# Patient Record
Sex: Female | Born: 1975
Health system: Southern US, Community
[De-identification: ages and names within clinical notes are randomized; demographics above are authoritative.]

## PROBLEM LIST (undated history)

## (undated) DIAGNOSIS — R519 Headache, unspecified: Secondary | ICD-10-CM

## (undated) DIAGNOSIS — T4145XA Adverse effect of unspecified anesthetic, initial encounter: Secondary | ICD-10-CM

## (undated) DIAGNOSIS — M436 Torticollis: Secondary | ICD-10-CM

## (undated) DIAGNOSIS — I498 Other specified cardiac arrhythmias: Secondary | ICD-10-CM

## (undated) DIAGNOSIS — D649 Anemia, unspecified: Secondary | ICD-10-CM

## (undated) DIAGNOSIS — J069 Acute upper respiratory infection, unspecified: Secondary | ICD-10-CM

## (undated) DIAGNOSIS — K219 Gastro-esophageal reflux disease without esophagitis: Secondary | ICD-10-CM

## (undated) DIAGNOSIS — I8290 Acute embolism and thrombosis of unspecified vein: Secondary | ICD-10-CM

## (undated) DIAGNOSIS — C4491 Basal cell carcinoma of skin, unspecified: Secondary | ICD-10-CM

## (undated) DIAGNOSIS — T783XXA Angioneurotic edema, initial encounter: Secondary | ICD-10-CM

## (undated) DIAGNOSIS — Z87442 Personal history of urinary calculi: Secondary | ICD-10-CM

## (undated) DIAGNOSIS — R42 Dizziness and giddiness: Secondary | ICD-10-CM

## (undated) DIAGNOSIS — Z8489 Family history of other specified conditions: Secondary | ICD-10-CM

## (undated) DIAGNOSIS — L309 Dermatitis, unspecified: Secondary | ICD-10-CM

## (undated) DIAGNOSIS — J45909 Unspecified asthma, uncomplicated: Secondary | ICD-10-CM

## (undated) DIAGNOSIS — R569 Unspecified convulsions: Secondary | ICD-10-CM

## (undated) DIAGNOSIS — R197 Diarrhea, unspecified: Secondary | ICD-10-CM

## (undated) DIAGNOSIS — T8859XA Other complications of anesthesia, initial encounter: Secondary | ICD-10-CM

## (undated) DIAGNOSIS — R51 Headache: Secondary | ICD-10-CM

## (undated) HISTORY — DX: Angioneurotic edema, initial encounter: T78.3XXA

## (undated) HISTORY — PX: SINOSCOPY: SHX187

## (undated) HISTORY — PX: BASAL CELL CARCINOMA EXCISION: SHX1214

## (undated) HISTORY — DX: Acute upper respiratory infection, unspecified: J06.9

## (undated) HISTORY — DX: Dermatitis, unspecified: L30.9

## (undated) HISTORY — PX: TYMPANOSTOMY TUBE PLACEMENT: SHX32

---
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
## 2003-12-20 HISTORY — PX: DILATION AND CURETTAGE OF UTERUS: SHX78

## 2003-12-20 HISTORY — PX: TUBAL LIGATION: SHX77

## 2003-12-20 HISTORY — PX: LAPAROSCOPIC ENDOMETRIOSIS FULGURATION: SUR769

## 2010-12-28 LAB — HM COLONOSCOPY

## 2012-12-19 HISTORY — PX: NASAL SEPTOPLASTY W/ TURBINOPLASTY: SHX2070

## 2017-12-17 ENCOUNTER — Encounter (HOSPITAL_COMMUNITY): Payer: Self-pay | Admitting: Emergency Medicine

## 2017-12-17 ENCOUNTER — Emergency Department (HOSPITAL_COMMUNITY): Payer: Self-pay

## 2017-12-17 ENCOUNTER — Other Ambulatory Visit: Payer: Self-pay

## 2017-12-17 ENCOUNTER — Emergency Department (HOSPITAL_COMMUNITY)
Admission: EM | Admit: 2017-12-17 | Discharge: 2017-12-18 | Disposition: A | Discharge status: Home / Self Care | Payer: Self-pay | Attending: Emergency Medicine | Admitting: Emergency Medicine

## 2017-12-17 DIAGNOSIS — Z79899 Other long term (current) drug therapy: Secondary | ICD-10-CM | POA: Insufficient documentation

## 2017-12-17 DIAGNOSIS — J4521 Mild intermittent asthma with (acute) exacerbation: Secondary | ICD-10-CM

## 2017-12-17 DIAGNOSIS — J45901 Unspecified asthma with (acute) exacerbation: Secondary | ICD-10-CM | POA: Insufficient documentation

## 2017-12-17 DIAGNOSIS — J40 Bronchitis, not specified as acute or chronic: Secondary | ICD-10-CM

## 2017-12-17 HISTORY — DX: Unspecified asthma, uncomplicated: J45.909

## 2017-12-17 HISTORY — DX: Torticollis: M43.6

## 2017-12-17 HISTORY — DX: Dizziness and giddiness: R42

## 2017-12-17 LAB — D-DIMER, QUANTITATIVE: D-Dimer, Quant: 0.52 ug/mL-FEU — ABNORMAL HIGH (ref 0.00–0.50)

## 2017-12-17 LAB — CBC
HCT: 38.2 % (ref 36.0–46.0)
HEMOGLOBIN: 12.3 g/dL (ref 12.0–15.0)
MCH: 28.6 pg (ref 26.0–34.0)
MCHC: 32.2 g/dL (ref 30.0–36.0)
MCV: 88.8 fL (ref 78.0–100.0)
PLATELETS: 260 10*3/uL (ref 150–400)
RBC: 4.3 MIL/uL (ref 3.87–5.11)
RDW: 12.6 % (ref 11.5–15.5)
WBC: 10.2 10*3/uL (ref 4.0–10.5)

## 2017-12-17 LAB — BASIC METABOLIC PANEL
ANION GAP: 10 (ref 5–15)
BUN: 13 mg/dL (ref 6–20)
CHLORIDE: 102 mmol/L (ref 101–111)
CO2: 24 mmol/L (ref 22–32)
Calcium: 9 mg/dL (ref 8.9–10.3)
Creatinine, Ser: 0.73 mg/dL (ref 0.44–1.00)
GFR calc Af Amer: >60 mL/min (ref >60)
GLUCOSE: 96 mg/dL (ref 65–99)
POTASSIUM: 3.6 mmol/L (ref 3.5–5.1)
Sodium: 136 mmol/L (ref 135–145)

## 2017-12-17 MED ORDER — IPRATROPIUM BROMIDE 0.02 % IN SOLN
0.5000 mg | Freq: Once | RESPIRATORY_TRACT | Status: AC
  Administered 2017-12-17: 0.5 mg via RESPIRATORY_TRACT

## 2017-12-17 MED ORDER — METHYLPREDNISOLONE SODIUM SUCC 125 MG IJ SOLR
125.0000 mg | Freq: Once | INTRAMUSCULAR | Status: AC
  Administered 2017-12-17: 125 mg via INTRAVENOUS
  Filled 2017-12-17: qty 2

## 2017-12-17 MED ORDER — IPRATROPIUM BROMIDE 0.02 % IN SOLN
RESPIRATORY_TRACT | Status: AC
  Administered 2017-12-17: 0.5 mg via RESPIRATORY_TRACT
  Filled 2017-12-17: qty 2.5

## 2017-12-17 MED ORDER — ALBUTEROL SULFATE (2.5 MG/3ML) 0.083% IN NEBU
5.0000 mg | INHALATION_SOLUTION | Freq: Once | RESPIRATORY_TRACT | Status: AC
  Administered 2017-12-17: 5 mg via RESPIRATORY_TRACT
  Filled 2017-12-17: qty 6

## 2017-12-17 MED ORDER — IPRATROPIUM BROMIDE 0.02 % IN SOLN
0.5000 mg | Freq: Once | RESPIRATORY_TRACT | Status: AC
  Administered 2017-12-17: 0.5 mg via RESPIRATORY_TRACT
  Filled 2017-12-17: qty 2.5

## 2017-12-17 MED ORDER — IOPAMIDOL (ISOVUE-370) INJECTION 76%
INTRAVENOUS | Status: AC
Start: 2017-12-17 — End: 2017-12-17
  Administered 2017-12-17: 100 mL
  Filled 2017-12-17: qty 100

## 2017-12-17 NOTE — ED Provider Notes (Addendum)
[place] EMERGENCY DEPARTMENT Provider Note   CSN: [id number] Arrival date & time: 12/17/17  1612     History   Chief Complaint Chief Complaint  Patient presents with  . Shortness of Breath    HPI Erin Collins is a 41 y.o. female.  HPI  Patient with history of asthma presents with cough and wheezing for the past couple of days.  She also complains of subjective fever.  She was seen at an urgent care yesterday and diagnosed with an upper respiratory infection.  States her wheezing has gotten worse since yesterday.  She is using albuterol both inhaler and nebulized solution without much relief.  She denies chest pain.  She has had no leg swelling.  She has no history of DVT.  No recent travel/trauma/surgery. There are no other associated systemic symptoms, there are no other alleviating or modifying factors.   Past Medical History:  Diagnosis Date  . Asthma   . Torticollis   . Vertigo     There are no active problems to display for this patient.   PMHx- asthma  OB History    No data available       Home Medications    Prior to Admission medications   Medication Sig Start Date End Date Taking? Authorizing Provider  azithromycin (ZITHROMAX) 250 MG tablet Take 250-500 mg by mouth daily. 500mg  on Day 1 and 250mg  daily until gone 12/16/17  Yes [provider]  fexofenadine-pseudoephedrine (ALLEGRA-D 24) 180-240 MG 24 hr tablet Take 1 tablet by mouth daily.   Yes [provider]  montelukast (SINGULAIR) 10 MG tablet Take 10 mg by mouth at bedtime.   Yes [provider]  albuterol (ACCUNEB) 0.63 MG/3ML nebulizer solution Take 6 mLs (1.26 mg total) by nebulization every 4 (four) hours as needed for wheezing or shortness of breath. 12/18/17   Mashonda Broski, Forbes Cellar, MD  albuterol (PROVENTIL HFA;VENTOLIN HFA) 108 (90 Base) MCG/ACT inhaler Inhale 1-2 puffs into the lungs every 6 (six) hours as needed for wheezing or shortness of breath.  12/18/17   Briasia Flinders, Forbes Cellar, MD  predniSONE (DELTASONE) 50 MG tablet Take 1 tablet (50 mg total) by mouth daily. 12/17/17   MabeForbes Cellar, MD    Family History No family history on file.  Social History Social History   Tobacco Use  . Smoking status: Never Smoker  . Smokeless tobacco: Never Used  Substance Use Topics  . Alcohol use: Yes  . Drug use: No     Allergies   Amoxicillin; Cefuroxime axetil; Hydrocodone; Levofloxacin; Morphine; Oxycodone-acetaminophen; Penicillins; and Propoxyphene   Review of Systems Review of Systems  ROS reviewed and all otherwise negative except for mentioned in HPI   Physical Exam Updated Vital Signs BP (!) 106/56   Pulse (!) 120   Temp 98.3 F (36.8 C) (Oral)   Resp (!) 24   Ht 5\' 4"  (1.626 m)   Wt 84.8 kg (187 lb)   LMP 12/05/2017   SpO2 90%   BMI 32.10 kg/m  Vitals reviewed Physical Exam  Physical Examination: General appearance - alert, well appearing, and in no distress Mental status - alert, oriented to person, place, and time Eyes - no conjunctival injection, no scleral icterus Mouth - mucous membranes moist, pharynx normal without lesions Neck - supple, no significant adenopathy Chest - BSS, bilateral expiratory wheezing, tachypnea, speaking in full sentences Heart - normal rate, regular rhythm, normal S1, S2, no murmurs, rubs, clicks or gallops Abdomen - soft,  nontender, nondistended, no masses or organomegaly Neurological - alert, oriented, normal speech Extremities - peripheral pulses normal, no pedal edema, no clubbing or cyanosis Skin - normal coloration and turgor, no rashes   ED Treatments / Results  Labs (all labs ordered are listed, but only abnormal results are displayed) Labs Reviewed  D-DIMER, QUANTITATIVE (NOT AT [facility]) - Abnormal; Notable for the following components:      Result Value   D-Dimer, Quant 0.52 (*)    All other components within normal limits  CBC  BASIC METABOLIC PANEL    EKG  EKG  Interpretation  Date/Time:  Sunday December 17 2017 21:31:08 EST Ventricular Rate:  121 PR Interval:    QRS Duration: 98 QT Interval:  301 QTC Calculation: 427 R Axis:   77 Text Interpretation:  Sinus tachycardia Nonspecific repol abnormality, diffuse leads No old tracing to compare Confirmed by Linker, Khamron Gellert ([id number]) on 12/17/2017 9:56:11 PM       Radiology Dg Chest 2 View  Result Date: 12/17/2017 CLINICAL DATA:  Shortness of breath EXAM: CHEST  2 VIEW COMPARISON:  None. FINDINGS: The heart size and mediastinal contours are within normal limits. There is no focal infiltrate, pulmonary edema, or pleural effusion. The visualized skeletal structures are unremarkable. IMPRESSION: No active cardiopulmonary disease. Electronically Signed   By: Wei-Chen  Lin M.D.   On: 12/17/2017 17:27    Procedures Procedures (including critical care time)  Medications Ordered in ED Medications  albuterol (PROVENTIL) (2.5 MG/3ML) 0.083% nebulizer solution 5 mg (5 mg Nebulization Given 12/17/17 1640)  ipratropium (ATROVENT) nebulizer solution 0.5 mg (0.5 mg Nebulization Given 12/17/17 1640)  albuterol (PROVENTIL) (2.5 MG/3ML) 0.083% nebulizer solution 5 mg (5 mg Nebulization Given 12/17/17 2102)  ipratropium (ATROVENT) nebulizer solution 0.5 mg (0.5 mg Nebulization Given 12/17/17 2102)  methylPREDNISolone sodium succinate (SOLU-MEDROL) 125 mg/2 mL injection 125 mg (125 mg Intravenous Given 12/17/17 2102)  albuterol (PROVENTIL) (2.5 MG/3ML) 0.083% nebulizer solution 5 mg (5 mg Nebulization Given 12/17/17 2243)  ipratropium (ATROVENT) nebulizer solution 0.5 mg (0.5 mg Nebulization Given 12/17/17 2243)  iopamidol (ISOVUE-370) 76 % injection (100 mLs  Contrast Given 12/17/17 2328)     Initial Impression / Assessment and Plan / ED Course  I have reviewed the triage vital signs and the nursing notes.  Pertinent labs & imaging results that were available during my care of the patient were reviewed by me  and considered in my medical decision making (see chart for details).    Patient presenting with cough and wheezing.  She has been treated with 3 duo nebs as well as Solu-Medrol.  She is breathing much easier moving much better air and states that she feels improved.  Her labs are reassuring with the exception of a mild elevation of her d-dimer.  CT angios has been obtained and the read is pending.  She feels she is able to manage her symptoms at home.  We will recheck her vital signs to be sure she is not hypoxic and then anticipate if this is normal as well as the CT is normal that she can be discharged home.  Final Clinical Impressions(s) / ED Diagnoses   Final diagnoses:  Exacerbation of intermittent asthma, unspecified asthma severity    ED Discharge Orders        Ordered    albuterol (ACCUNEB) 0.63 MG/3ML nebulizer solution  Every 4 hours PRN     12 /31/18 0000    albuterol (PROVENTIL HFA;VENTOLIN HFA) 108 (90 Base) MCG/ACT inhaler  Every 6  hours PRN     12/18/17 0000    predniSONE (DELTASONE) 50 MG tablet  Daily     12/18/17 0000       Pixie Casino, MD 12/18/17 0004    Pixie Casino, MD 12/18/17 0005

## 2017-12-17 NOTE — ED Notes (Signed)
Patient transported to CT 

## 2017-12-17 NOTE — ED Triage Notes (Signed)
Diagnosed with URI yesterday at [facility].  C/o increased wheezing and unable to get relief with nebs and inhalers.  C/o productive cough and congestion.  Fever 99.3 at home this morning.

## 2017-12-18 ENCOUNTER — Encounter (HOSPITAL_COMMUNITY): Payer: Self-pay | Admitting: Emergency Medicine

## 2017-12-18 ENCOUNTER — Other Ambulatory Visit: Payer: Self-pay

## 2017-12-18 ENCOUNTER — Observation Stay (HOSPITAL_COMMUNITY)
Admission: EM | Admit: 2017-12-18 | Discharge: 2017-12-19 | Disposition: A | Discharge status: Home / Self Care | Payer: Self-pay | Attending: Oncology | Admitting: Oncology

## 2017-12-18 DIAGNOSIS — R197 Diarrhea, unspecified: Secondary | ICD-10-CM

## 2017-12-18 DIAGNOSIS — J45901 Unspecified asthma with (acute) exacerbation: Principal | ICD-10-CM | POA: Insufficient documentation

## 2017-12-18 DIAGNOSIS — Z881 Allergy status to other antibiotic agents status: Secondary | ICD-10-CM

## 2017-12-18 DIAGNOSIS — Z79899 Other long term (current) drug therapy: Secondary | ICD-10-CM | POA: Insufficient documentation

## 2017-12-18 DIAGNOSIS — M436 Torticollis: Secondary | ICD-10-CM | POA: Insufficient documentation

## 2017-12-18 DIAGNOSIS — B348 Other viral infections of unspecified site: Secondary | ICD-10-CM | POA: Insufficient documentation

## 2017-12-18 DIAGNOSIS — Z880 Allergy status to penicillin: Secondary | ICD-10-CM | POA: Insufficient documentation

## 2017-12-18 DIAGNOSIS — Z885 Allergy status to narcotic agent status: Secondary | ICD-10-CM | POA: Insufficient documentation

## 2017-12-18 DIAGNOSIS — Z8249 Family history of ischemic heart disease and other diseases of the circulatory system: Secondary | ICD-10-CM

## 2017-12-18 DIAGNOSIS — R000 Tachycardia, unspecified: Secondary | ICD-10-CM | POA: Insufficient documentation

## 2017-12-18 DIAGNOSIS — Z833 Family history of diabetes mellitus: Secondary | ICD-10-CM

## 2017-12-18 DIAGNOSIS — Z825 Family history of asthma and other chronic lower respiratory diseases: Secondary | ICD-10-CM

## 2017-12-18 HISTORY — DX: Unspecified convulsions: R56.9

## 2017-12-18 HISTORY — DX: Other complications of anesthesia, initial encounter: T88.59XA

## 2017-12-18 HISTORY — DX: Headache, unspecified: R51.9

## 2017-12-18 HISTORY — DX: Basal cell carcinoma of skin, unspecified: C44.91

## 2017-12-18 HISTORY — DX: Personal history of urinary calculi: Z87.442

## 2017-12-18 HISTORY — DX: Gastro-esophageal reflux disease without esophagitis: K21.9

## 2017-12-18 HISTORY — DX: Anemia, unspecified: D64.9

## 2017-12-18 HISTORY — DX: Adverse effect of unspecified anesthetic, initial encounter: T41.45XA

## 2017-12-18 HISTORY — DX: Acute embolism and thrombosis of unspecified vein: I82.90

## 2017-12-18 HISTORY — DX: Other specified cardiac arrhythmias: I49.8

## 2017-12-18 HISTORY — DX: Family history of other specified conditions: Z84.89

## 2017-12-18 HISTORY — DX: Headache: R51

## 2017-12-18 LAB — BASIC METABOLIC PANEL
ANION GAP: 12 (ref 5–15)
BUN: 12 mg/dL (ref 6–20)
CALCIUM: 8.6 mg/dL — AB (ref 8.9–10.3)
CO2: 22 mmol/L (ref 22–32)
CREATININE: 0.77 mg/dL (ref 0.44–1.00)
Chloride: 100 mmol/L — ABNORMAL LOW (ref 101–111)
Glucose, Bld: 212 mg/dL — ABNORMAL HIGH (ref 65–99)
Potassium: 3.1 mmol/L — ABNORMAL LOW (ref 3.5–5.1)
SODIUM: 134 mmol/L — AB (ref 135–145)

## 2017-12-18 LAB — INFLUENZA PANEL BY PCR (TYPE A & B)
Influenza A By PCR: NEGATIVE
Influenza B By PCR: NEGATIVE

## 2017-12-18 LAB — CBC
HEMATOCRIT: 37.7 % (ref 36.0–46.0)
Hemoglobin: 12.1 g/dL (ref 12.0–15.0)
MCH: 28.3 pg (ref 26.0–34.0)
MCHC: 32.1 g/dL (ref 30.0–36.0)
MCV: 88.3 fL (ref 78.0–100.0)
Platelets: 254 10*3/uL (ref 150–400)
RBC: 4.27 MIL/uL (ref 3.87–5.11)
RDW: 12.8 % (ref 11.5–15.5)
WBC: 10.5 10*3/uL (ref 4.0–10.5)

## 2017-12-18 MED ORDER — MONTELUKAST SODIUM 10 MG PO TABS
10.0000 mg | ORAL_TABLET | Freq: Every day | ORAL | Status: DC
Start: 2017-12-18 — End: 2017-12-19
  Administered 2017-12-18: 10 mg via ORAL
  Filled 2017-12-18 (×2): qty 1

## 2017-12-18 MED ORDER — ALBUTEROL SULFATE (2.5 MG/3ML) 0.083% IN NEBU
2.5000 mg | INHALATION_SOLUTION | RESPIRATORY_TRACT | Status: DC
  Administered 2017-12-18: 2.5 mg via RESPIRATORY_TRACT

## 2017-12-18 MED ORDER — ALBUTEROL (5 MG/ML) CONTINUOUS INHALATION SOLN
10.0000 mg/h | INHALATION_SOLUTION | Freq: Once | RESPIRATORY_TRACT | Status: AC
  Administered 2017-12-18: 10 mg/h via RESPIRATORY_TRACT
  Filled 2017-12-18: qty 20

## 2017-12-18 MED ORDER — ENOXAPARIN SODIUM 40 MG/0.4ML ~~LOC~~ SOLN: 40.0000 mg | SUBCUTANEOUS | Status: DC

## 2017-12-18 MED ORDER — PREDNISONE 20 MG PO TABS
40.0000 mg | ORAL_TABLET | Freq: Every day | ORAL | Status: DC
  Administered 2017-12-19: 40 mg via ORAL
  Filled 2017-12-18: qty 2

## 2017-12-18 MED ORDER — ACETAMINOPHEN 650 MG RE SUPP: 650.0000 mg | Freq: Four times a day (QID) | RECTAL | Status: DC | PRN

## 2017-12-18 MED ORDER — IPRATROPIUM-ALBUTEROL 0.5-2.5 (3) MG/3ML IN SOLN
3.0000 mL | Freq: Four times a day (QID) | RESPIRATORY_TRACT | Status: DC
  Administered 2017-12-18 – 2017-12-19 (×3): 3 mL via RESPIRATORY_TRACT
  Filled 2017-12-18 (×4): qty 3

## 2017-12-18 MED ORDER — GUAIFENESIN ER 1200 MG PO TB12: 1.0000 | ORAL_TABLET | Freq: Two times a day (BID) | ORAL | 0 refills | Status: DC

## 2017-12-18 MED ORDER — METHYLPREDNISOLONE SODIUM SUCC 125 MG IJ SOLR
125.0000 mg | Freq: Once | INTRAMUSCULAR | Status: AC
  Administered 2017-12-18: 125 mg via INTRAVENOUS
  Filled 2017-12-18: qty 2

## 2017-12-18 MED ORDER — POTASSIUM CHLORIDE CRYS ER 20 MEQ PO TBCR
40.0000 meq | EXTENDED_RELEASE_TABLET | Freq: Once | ORAL | Status: AC
  Administered 2017-12-18: 40 meq via ORAL
  Filled 2017-12-18: qty 2

## 2017-12-18 MED ORDER — IPRATROPIUM BROMIDE 0.02 % IN SOLN
RESPIRATORY_TRACT | Status: AC
  Filled 2017-12-18: qty 2.5

## 2017-12-18 MED ORDER — IPRATROPIUM BROMIDE 0.02 % IN SOLN: 0.5000 mg | RESPIRATORY_TRACT | Status: DC

## 2017-12-18 MED ORDER — SENNOSIDES-DOCUSATE SODIUM 8.6-50 MG PO TABS: 1.0000 | ORAL_TABLET | Freq: Every evening | ORAL | Status: DC | PRN

## 2017-12-18 MED ORDER — PREDNISONE 50 MG PO TABS: 50.0000 mg | ORAL_TABLET | Freq: Every day | ORAL | 0 refills | Status: DC

## 2017-12-18 MED ORDER — ALBUTEROL SULFATE (2.5 MG/3ML) 0.083% IN NEBU
3.0000 mL | INHALATION_SOLUTION | RESPIRATORY_TRACT | Status: DC | PRN
  Administered 2017-12-19: 3 mL via RESPIRATORY_TRACT
  Filled 2017-12-18: qty 3

## 2017-12-18 MED ORDER — AZITHROMYCIN 250 MG PO TABS
250.0000 mg | ORAL_TABLET | Freq: Every day | ORAL | Status: DC
  Administered 2017-12-18 – 2017-12-19 (×2): 250 mg via ORAL
  Filled 2017-12-18 (×2): qty 1

## 2017-12-18 MED ORDER — SODIUM CHLORIDE 0.9 % IV SOLN
INTRAVENOUS | Status: DC
  Administered 2017-12-18: 10:00:00 via INTRAVENOUS

## 2017-12-18 MED ORDER — ACETAMINOPHEN 325 MG PO TABS
650.0000 mg | ORAL_TABLET | Freq: Four times a day (QID) | ORAL | Status: DC | PRN
  Administered 2017-12-18: 650 mg via ORAL
  Filled 2017-12-18: qty 2

## 2017-12-18 MED ORDER — ENOXAPARIN SODIUM 40 MG/0.4ML ~~LOC~~ SOLN
40.0000 mg | SUBCUTANEOUS | Status: DC
  Administered 2017-12-18: 40 mg via SUBCUTANEOUS
  Filled 2017-12-18 (×3): qty 0.4

## 2017-12-18 MED ORDER — IPRATROPIUM BROMIDE 0.02 % IN SOLN
0.5000 mg | RESPIRATORY_TRACT | Status: DC
  Administered 2017-12-18: 0.5 mg via RESPIRATORY_TRACT

## 2017-12-18 MED ORDER — ALBUTEROL SULFATE (2.5 MG/3ML) 0.083% IN NEBU
INHALATION_SOLUTION | RESPIRATORY_TRACT | Status: AC
Start: 2017-12-18 — End: 2017-12-19
  Filled 2017-12-18: qty 3

## 2017-12-18 MED ORDER — ALBUTEROL SULFATE (2.5 MG/3ML) 0.083% IN NEBU
3.0000 mL | INHALATION_SOLUTION | Freq: Four times a day (QID) | RESPIRATORY_TRACT | Status: DC | PRN
Start: 2017-12-18 — End: 2017-12-18

## 2017-12-18 MED ORDER — ALBUTEROL (5 MG/ML) CONTINUOUS INHALATION SOLN
10.0000 mg/h | INHALATION_SOLUTION | RESPIRATORY_TRACT | Status: AC
  Administered 2017-12-18: 10 mg/h via RESPIRATORY_TRACT
  Filled 2017-12-18: qty 20

## 2017-12-18 MED ORDER — MENTHOL 3 MG MT LOZG
1.0000 | LOZENGE | OROMUCOSAL | Status: DC | PRN
  Administered 2017-12-18: 3 mg via ORAL
  Filled 2017-12-18: qty 9

## 2017-12-18 MED ORDER — ALBUTEROL SULFATE 0.63 MG/3ML IN NEBU: 2.0000 | INHALATION_SOLUTION | RESPIRATORY_TRACT | 0 refills | Status: DC | PRN

## 2017-12-18 MED ORDER — MAGNESIUM SULFATE 2 GM/50ML IV SOLN
2.0000 g | Freq: Once | INTRAVENOUS | Status: AC
  Administered 2017-12-18: 2 g via INTRAVENOUS
  Filled 2017-12-18: qty 50

## 2017-12-18 MED ORDER — ALBUTEROL SULFATE HFA 108 (90 BASE) MCG/ACT IN AERS: 1.0000 | INHALATION_SPRAY | Freq: Four times a day (QID) | RESPIRATORY_TRACT | 0 refills | Status: DC | PRN

## 2017-12-18 MED ORDER — PROMETHAZINE-DM 6.25-15 MG/5ML PO SYRP: 5.0000 mL | ORAL_SOLUTION | Freq: Four times a day (QID) | ORAL | 0 refills | Status: DC | PRN

## 2017-12-18 MED ORDER — IPRATROPIUM BROMIDE 0.02 % IN SOLN
0.5000 mg | Freq: Once | RESPIRATORY_TRACT | Status: AC
  Administered 2017-12-18: 0.5 mg via RESPIRATORY_TRACT
  Filled 2017-12-18: qty 2.5

## 2017-12-18 NOTE — ED Provider Notes (Signed)
[place] EMERGENCY DEPARTMENT Provider Note   CSN: [id number] Arrival date & time: 12/18/17  4315     History   Chief Complaint Chief Complaint  Patient presents with  . Shortness of Breath   HPI  Blood pressure (!) 110/58, pulse (!) 107, temperature (!) 97.5 F (36.4 C), temperature source Oral, resp. rate 19, height 5\' 4"  (1.626 m), weight 84.8 kg (187 lb), last menstrual period 12/05/2017, SpO2 97 %.  Erin Collins is a 41 y.o. female with past medical history significant for asthma who recently moved to the area, has not established primary care with persistent cough, wheezing and shortness of breath and low-grade fever over the last several days.  Patient was recently seen in the ED, she had a negative PE CT angiogram, she received aggressive nebulizer treatments in addition to prednisone, when leaving the emergency department she immediately felt short of breath, she went home and took a shower and that continued, she gave herself 2x nebulizer treatments with little relief and return to the ED, triage initiated hour-long nebulizer given and she remains persistently short of breath.  Past Medical History:  Diagnosis Date  . Asthma   . Torticollis   . Vertigo     There are no active problems to display for this patient.   Past Surgical History:  Procedure Laterality Date  . LAPAROSCOPIC ENDOMETRIOSIS FULGURATION      OB History    No data available       Home Medications    Prior to Admission medications   Medication Sig Start Date End Date Taking? Authorizing Provider  albuterol (ACCUNEB) 0.63 MG/3ML nebulizer solution Take 6 mLs (1.26 mg total) by nebulization every 4 (four) hours as needed for wheezing or shortness of breath. 12/18/17   Mabe, Forbes Cellar, MD  albuterol (PROVENTIL HFA;VENTOLIN HFA) 108 (90 Base) MCG/ACT inhaler Inhale 1-2 puffs into the lungs every 6 (six) hours as needed for wheezing or shortness of breath. 12/18/17   Mabe,  Forbes Cellar, MD  azithromycin (ZITHROMAX) 250 MG tablet Take 250-500 mg by mouth daily. 500mg  on Day 1 and 250mg  daily until gone 12/16/17   [provider]  fexofenadine-pseudoephedrine (ALLEGRA-D 24) 180-240 MG 24 hr tablet Take 1 tablet by mouth daily.    [provider]  Guaifenesin 1200 MG TB12 Take 1 tablet (1,200 mg total) by mouth 2 (two) times daily. 12/18/17   Lawyer, Harrell Gave, PA-C  montelukast (SINGULAIR) 10 MG tablet Take 10 mg by mouth at bedtime.    [provider]  predniSONE (DELTASONE) 50 MG tablet Take 1 tablet (50 mg total) by mouth daily. 12/17/17   Mabe, Forbes Cellar, MD  predniSONE (DELTASONE) 50 MG tablet Take 1 tablet (50 mg total) by mouth daily. 12/18/17   Lawyer, Harrell Gave, PA-C  promethazine-dextromethorphan (PROMETHAZINE-DM) 6.25-15 MG/5ML syrup Take 5 mLs by mouth 4 (four) times daily as needed for cough. 12/18/17   Dalia Heading, PA-C    Family History No family history on file.  Social History Social History   Tobacco Use  . Smoking status: Never Smoker  . Smokeless tobacco: Never Used  Substance Use Topics  . Alcohol use: Yes  . Drug use: No     Allergies   Amoxicillin; Cefuroxime axetil; Hydrocodone; Levofloxacin; Morphine; Oxycodone-acetaminophen; Penicillins; and Propoxyphene   Review of Systems Review of Systems  A complete review of systems was obtained and all systems are negative except as noted in the HPI and PMH.   Physical Exam  Updated Vital Signs BP 126/71   Pulse (!) 109   Temp (!) 97.5 F (36.4 C) (Oral)   Resp 13   Ht 5\' 4"  (1.626 m)   Wt 84.8 kg (187 lb)   LMP 12/05/2017   SpO2 96%   BMI 32.10 kg/m   Physical Exam  Constitutional: She is oriented to person, place, and time. She appears well-developed and well-nourished. No distress.  HENT:  Head: Normocephalic and atraumatic.  Mouth/Throat: Oropharynx is clear and moist.  Eyes: Conjunctivae and EOM are normal. Pupils are equal, round,  and reactive to light.  Neck: Normal range of motion.  Cardiovascular: Normal rate, regular rhythm and intact distal pulses.  Pulmonary/Chest: Effort normal and breath sounds normal.  Moderately decreased air movement especially at the bases, moderate expiratory wheezing.  Abdominal: Soft. There is no tenderness.  Musculoskeletal: Normal range of motion.  Neurological: She is alert and oriented to person, place, and time.  Skin: She is not diaphoretic.  Psychiatric: She has a normal mood and affect.  Nursing note and vitals reviewed.    ED Treatments / Results  Labs (all labs ordered are listed, but only abnormal results are displayed) Labs Reviewed - No data to display  EKG  EKG Interpretation None       Radiology Dg Chest 2 View  Result Date: 12/17/2017 CLINICAL DATA:  Shortness of breath EXAM: CHEST  2 VIEW COMPARISON:  None. FINDINGS: The heart size and mediastinal contours are within normal limits. There is no focal infiltrate, pulmonary edema, or pleural effusion. The visualized skeletal structures are unremarkable. IMPRESSION: No active cardiopulmonary disease. Electronically Signed   By: Abelardo Diesel M.D.   On: 12/17/2017 17:27   Ct Angio Chest Pe W And/or Wo Contrast  Result Date: 12/18/2017 CLINICAL DATA:  41 y/o F; wheezing, productive cough, congestion. History of asthma. PE suspected, intermediate probability, positive D-dimer. EXAM: CT ANGIOGRAPHY CHEST WITH CONTRAST TECHNIQUE: Multidetector CT imaging of the chest was performed using the standard protocol during bolus administration of intravenous contrast. Multiplanar CT image reconstructions and MIPs were obtained to evaluate the vascular anatomy. CONTRAST:  63 cc Isovue 370 COMPARISON:  12/17/2017 chest radiograph FINDINGS: Cardiovascular: Satisfactory opacification of the pulmonary arteries to the segmental level. No evidence of pulmonary embolism. Normal heart size. No pericardial effusion. Mediastinum/Nodes:  No enlarged mediastinal, hilar, or axillary lymph nodes. Thyroid gland, trachea, and esophagus demonstrate no significant findings. Lungs/Pleura: Lungs are clear. No pleural effusion or pneumothorax. Upper Abdomen: No acute abnormality. Musculoskeletal: No chest wall abnormality. No acute or significant osseous findings. Review of the MIP images confirms the above findings. IMPRESSION: No pulmonary embolus identified. Clear lungs. Normal CTA of the chest. Electronically Signed   By: Kristine Garbe M.D.   On: 12/18/2017 00:02    Procedures Procedures (including critical care time)  CRITICAL CARE Performed by: Elmyra Ricks Navea Woodrow   Total critical care time: 35 minutes  Critical care time was exclusive of separately billable procedures and treating other patients.  Critical care was necessary to treat or prevent imminent or life-threatening deterioration.  Critical care was time spent personally by me on the following activities: development of treatment plan with patient and/or surrogate as well as nursing, discussions with consultants, evaluation of patient's response to treatment, examination of patient, obtaining history from patient or surrogate, ordering and performing treatments and interventions, ordering and review of laboratory studies, ordering and review of radiographic studies, pulse oximetry and re-evaluation of patient's condition.   Medications Ordered in ED  Medications  albuterol (PROVENTIL,VENTOLIN) solution continuous neb (not administered)  magnesium sulfate IVPB 2 g 50 mL (2 g Intravenous New Bag/Given 12/18/17 1011)  albuterol (PROVENTIL,VENTOLIN) solution continuous neb (10 mg/hr Nebulization New Bag/Given 12/18/17 0944)  0.9 %  sodium chloride infusion ( Intravenous New Bag/Given 12/18/17 1011)  ipratropium (ATROVENT) nebulizer solution 0.5 mg (0.5 mg Nebulization Given 12/18/17 0944)  methylPREDNISolone sodium succinate (SOLU-MEDROL) 125 mg/2 mL injection 125 mg  (125 mg Intravenous Given 12/18/17 1011)     Initial Impression / Assessment and Plan / ED Course  I have reviewed the triage vital signs and the nursing notes.  Pertinent labs & imaging results that were available during my care of the patient were reviewed by me and considered in my medical decision making (see chart for details).     Vitals:   12/18/17 0838 12/18/17 0930 12/18/17 0944 12/18/17 0945  BP: 119/72 (!) 110/58  126/71  Pulse: (!) 108 (!) 107  (!) 109  Resp: 20 19  13   Temp:      TempSrc:      SpO2: 98% 95% 97% 96%  Weight:      Height:        Medications  albuterol (PROVENTIL,VENTOLIN) solution continuous neb (not administered)  magnesium sulfate IVPB 2 g 50 mL (2 g Intravenous New Bag/Given 12/18/17 1011)  albuterol (PROVENTIL,VENTOLIN) solution continuous neb (10 mg/hr Nebulization New Bag/Given 12/18/17 0944)  0.9 %  sodium chloride infusion ( Intravenous New Bag/Given 12/18/17 1011)  ipratropium (ATROVENT) nebulizer solution 0.5 mg (0.5 mg Nebulization Given 12/18/17 0944)  methylPREDNISolone sodium succinate (SOLU-MEDROL) 125 mg/2 mL injection 125 mg (125 mg Intravenous Given 12/18/17 1011)    Erin Collins is 41 y.o. female presenting with stent wheeze, shortness of breath.  Had negative PE workup last night.  Initially after receiving prednisone and multiple nebulizer she felt better but when she was discharged home shortness of breath returned and was not alleviated by 2 nebulizer treatments and using her rescue inhaler.  Patient received hour-long nebulizer before my evaluation and she continues to have reduced air movement with wheezing and still feels short of breath.  Will need admission.  Unassigned admission to family practice.    Final Clinical Impressions(s) / ED Diagnoses   Final diagnoses:  None    ED Discharge Orders    None       Karen Kays Charna Elizabeth 12/18/17 [place], Wenda Overland, MD 12/20/17 1459

## 2017-12-18 NOTE — ED Notes (Signed)
Attempted report 

## 2017-12-18 NOTE — ED Notes (Signed)
Informed Ronalee Belts - RN of pt's vitals.

## 2017-12-18 NOTE — H&P (Signed)
Date: 12/18/2017               Patient Name:  Erin Collins MRN: [id number]  DOB: 14-Sep-1976 Age / Sex: 41 y.o., female   PCP: Patient, No Pcp Per         Medical Service: Internal Medicine Teaching Service         Attending Physician: Dr. Annia Belt, MD    First Contact: Dr. Ronalee Red Pager: [phone]  Second Contact: Dr. Heber Scenic Pager: [phone]       After Hours (After 5p/  First Contact Pager: [phone]  weekends / holidays): Second Contact Pager: [phone]   Chief Complaint: shortness of breath and chest tightness  History of Present Illness:  Erin Collins is a 41yo female with PMH significant for asthma who presents with increased shortness of breath and chest tightness. She has used home nebulizers x2 and MDI without relief.  She states that for the last week, she has felt sick with cold-like symptoms. On Saturday, she felt more short of breath and went to Urgent Care. She was given albuterol nebulizer, cough medicine, and z-pack x5d (which she started taking on Saturday) and sent home. She states that since then, she has continued to feel short of breath, unrelieved by home albuterol inhaler and nebulizer. She endorses subjective fevers, myalgias, chest tightness below both breasts that is exacerbated by cough and deep breaths. Endorses cough productive of NB yellow/white sputum, congestion, postnasal drip, and sore throat. Endorses palpitations after receiving breathing treatment. Denies abdominal pain, N/V, dysuria, or current headache.  She presented to the ED last night due to the persistence of her symptoms, where she had a PE work-up, which was unremarkable. She was discharged after prednisone and multiple nebulizer treatments, and initially felt better, but over the last few hours, she states that she has been feeling worse, so she returned to the ED.  +Sick contacts (multiple family members and co-workers). No recent travel. Denies smoking or other illicit drug use. Endorses  very rare alcohol use. She states she just moved from [place] and has not yet established care with a PCP here. She states she uses her albuterol inhaler 10-12 times daily and does wake up multiple times at night feeling short of breath. Denies prior hospitalizations for asthma exacerbations or intubation. She states that her asthma is usually exacerbated by the cold weather, humidity, or environmental allergens.  ED Course: - BP 128/69, HR 117, RR 28, temp 97.5, O2 100% on RA - CXR without acute cardiopulmonary disease. EKG with sinus tachycardia. - Received IV solumedrol, IV Mg 2g, albuterol + ipratropium x1, and albuterol nebulizer with continued wheezing.  Meds:  Current Meds  Medication Sig  . albuterol (ACCUNEB) 0.63 MG/3ML nebulizer solution Take 6 mLs (1.26 mg total) by nebulization every 4 (four) hours as needed for wheezing or shortness of breath.  Marland Kitchen albuterol (PROVENTIL HFA;VENTOLIN HFA) 108 (90 Base) MCG/ACT inhaler Inhale 1-2 puffs into the lungs every 6 (six) hours as needed for wheezing or shortness of breath.  Marland Kitchen azithromycin (ZITHROMAX) 250 MG tablet Take 250-500 mg by mouth daily. 500mg  on Day 1 and 250mg  daily until gone  . fexofenadine-pseudoephedrine (ALLEGRA-D 24) 180-240 MG 24 hr tablet Take 1 tablet by mouth daily.  . Guaifenesin 1200 MG TB12 Take 1 tablet (1,200 mg total) by mouth 2 (two) times daily.  . montelukast (SINGULAIR) 10 MG tablet Take 10 mg by mouth at bedtime.  . promethazine-dextromethorphan (PROMETHAZINE-DM) 6.25-15 MG/5ML syrup Take 5  mLs by mouth 4 (four) times daily as needed for cough.   Allergies: Allergies as of 12/18/2017 - Review Complete 12/18/2017  Allergen Reaction Noted  . Amoxicillin Hives 09/15/2017  . Cefuroxime axetil Hives 09/15/2017  . Hydrocodone Itching 09/15/2017  . Levofloxacin Other (See Comments) 09/15/2017  . Morphine Itching 09/15/2017  . Oxycodone-acetaminophen Itching 09/15/2017  . Penicillins Hives 12/17/2017  .  Propoxyphene Itching 09/15/2017   Past Medical History:  Diagnosis Date  . Asthma   . Torticollis   . Vertigo    Family History:  - mom: asthma - DM on mother's side of family - HTN on father's side of family  Social History:  - denies smoking or other illicit drug use - endorses very occasional alcohol use - lives at home with grandparents, 2 dogs and 2 cats at home - just moved from [place] in April 2018 - Not established with PCP in [place] yet  Review of Systems: Constitutional: Negative for diaphoresis and weight loss. Positive for subjective fevers. HEENT: Negative for blurred vision, hearing loss. Positive for sore throat and congestion. Respiratory: Positive for cough productive of white/yellow sputum, shortness of breath, and wheezing. Cardiovascular: Positive for chest tightness worsened by cough and deep breaths. Endorses current palpitations after nebulizer treatments. Positive for ankle swelling that worsens at the end of the day. Gastrointestinal: Negative for blood in stool, constipation, heartburn, nausea and vomiting. Positive for sharp left lower abdominal pain for the last 2 days, worse with cough. Positive for one loose stool earlier today. Able to tolerate PO intake. Genitourinary: Negative for dysuria and hematuria. Musculoskeletal: Negative for joint pain. Positive for myalgias. Neurological: Negative for dizziness, focal weakness, weakness and headaches.  Physical Exam: Blood pressure 126/71, pulse (!) 109, temperature (!) 97.5 F (36.4 C), temperature source Oral, resp. rate 13, height 5\' 4"  (1.626 m), weight 187 lb (84.8 kg), last menstrual period 12/05/2017, SpO2 96 %.  GEN: Well-appearing, young female sitting up in bed in NAD. Alert and oriented. On RA, able to speak in full sentences. HENT: [place]/AT. Moist mucous membranes. No visible lesions. No pharyngeal erythema. EYES: PERRL. Sclera non-icteric. Conjunctiva clear. NECK: No cervical LAD. RESP: Diffuse  wheezes, upper lung fields > lower lung fields. No increased work of breathing. CV: Tachycardic and regular rhythm. No murmurs, gallops, or rubs. No LE edema. ABD: Soft. Non-tender. Non-distended. Normoactive bowel sounds. EXT: No edema. Warm. 2+ DP and radial pulses bilaterally. SKIN: Capillary refill <2 sec. NEURO: Cranial nerves II-XII grossly intact. Able to lift all four extremities against gravity. No apparent audiovisual hallucinations. Speech fluent and appropriate. PSYCH: Patient is calm and pleasant. Appropriate affect. Well-groomed; speech is appropriate and on-subject.  Labs CBC Latest Ref Rng & Units 12/17/2017  WBC 4.0 - 10.5 K/uL 10.2  Hemoglobin 12.0 - 15.0 g/dL 12.3  Hematocrit 36.0 - 46.0 % 38.2  Platelets 150 - 400 K/uL 260   CMP Latest Ref Rng & Units 12/17/2017  Glucose 65 - 99 mg/dL 96  BUN 6 - 20 mg/dL 13  Creatinine 0.44 - 1.00 mg/dL 0.73  Sodium 135 - 145 mmol/L 136  Potassium 3.5 - 5.1 mmol/L 3.6  Chloride 101 - 111 mmol/L 102  CO2 22 - 32 mmol/L 24  Calcium 8.9 - 10.3 mg/dL 9.0   D-dimer 0.52  EKG: personally reviewed my interpretation is sinus tachycardia  CXR 12/17/2017 No active cardiopulmonary disease.  CT-A 12/18/2017 No pulmonary embolus identified. Clear lungs. Normal CTA of the chest.  Assessment & Plan by Problem:  Active Problems:   Asthma exacerbation  Ms. How is a 41yo female with PMH significant for asthma who presents with increased shortness of breath and chest tightness, unrelieved by albuterol inhaler or nebulizer treatments.  Asthma exacerbation PE work-up last night negative. Received IV Mg 2g, albuterol + ipratropium x1, and albuterol nebulizer in the ED with improvement in her dyspnea. She is now on RA with O2 sats remaining >95%. She does still have some wheezing on exam. +Sick contacts and viral respiratory symptoms. No leukocytosis. Likely triggered by viral URI. CXR without evidence of pneumonia, however she was started on  azithromycin on 12/29 by urgent care, so we will continue her 5 day course. - Admit to med-surg - Albuterol nebulizer 2.5mg  q2h - Ipratropium nebulizer 0.5mg  q4h - Albuterol inhaler 1-2 puffs q6h PRN for wheezing or SOB - Azithromycin 250mg  daily x5 days total (12/29-1/2) - Continue home montelukast 10mg  daily - IV Solumedrol, will transition to PO prednisone 40mg  daily tomorrow - RVP, Rapid flu - Droplet precautions - CBC, BMP - HIV Ab screen  Diet: Regular VTE PPx: Lovenox Code: Full code Dispo: Admit patient to Observation with expected length of stay less than 2 midnights.  Signed: Colbert Ewing, MD 12/18/2017, 12:29 PM  Pager: Mamie Nick [phone]

## 2017-12-18 NOTE — Progress Notes (Signed)
1700 Received pt from ED, A&O x3, non-productive cough noted. Pt denies SOB, no chest pain.

## 2017-12-18 NOTE — ED Notes (Signed)
Pt rating SOB as 1/10

## 2017-12-18 NOTE — Discharge Planning (Signed)
[facility] consulted regarding uninsured pt needing PCP and Rx assistance.  EDCM provided PCP options in pt area to follow up with.  Pt instructed to utilize [facility] pharmacy upon discharge to obtain medications prescribed today.  No further EDCM needs identified at this time.

## 2017-12-18 NOTE — ED Notes (Addendum)
Pt took 2.5 mL of her prescribed personal liquid phenergan. Pharmacist notified, directed this RN to create a progress note.

## 2017-12-18 NOTE — Discharge Summary (Signed)
Name: Erin Collins MRN: [id number] DOB: October 03, 1976 41 y.o. PCP: Patient, No Pcp Per  Date of Admission: 12/18/2017  6:53 AM Date of Discharge: 12/19/2017, 12:04pm Attending Physician: Annia Belt, MD  Discharge Diagnosis: 1. Asthma exacerbation  Active Problems:   Asthma exacerbation   Discharge Medications: Allergies as of 12/19/2017      Reactions   Amoxicillin Hives   Cefuroxime Axetil Hives   Hydrocodone Itching   Levofloxacin Other (See Comments)   tendonitis   Morphine Itching   Oxycodone-acetaminophen Itching   Penicillins Hives   Has patient had a PCN reaction causing immediate rash, facial/tongue/throat swelling, SOB or lightheadedness with hypotension: Yes Has patient had a PCN reaction causing severe rash involving mucus membranes or skin necrosis: No Has patient had a PCN reaction that required hospitalization: No Has patient had a PCN reaction occurring within the last 10 years: No If all of the above answers are "NO", then may proceed with Cephalosporin use.   Propoxyphene Itching      Medication List    TAKE these medications   albuterol 0.63 MG/3ML nebulizer solution Commonly known as:  ACCUNEB Take 6 mLs (1.26 mg total) by nebulization every 4 (four) hours as needed for wheezing or shortness of breath.   albuterol 108 (90 Base) MCG/ACT inhaler Commonly known as:  PROVENTIL HFA;VENTOLIN HFA Inhale 1-2 puffs into the lungs every 6 (six) hours as needed for wheezing or shortness of breath.   azithromycin 250 MG tablet Commonly known as:  ZITHROMAX Take 250-500 mg by mouth daily. 500mg  on Day 1 and 250mg  daily until gone   benzonatate 100 MG capsule Commonly known as:  TESSALON Take 1 capsule (100 mg total) by mouth 3 (three) times daily as needed for up to 5 days for cough.   fexofenadine-pseudoephedrine 180-240 MG 24 hr tablet Commonly known as:  ALLEGRA-D 24 Take 1 tablet by mouth daily.   Guaifenesin 1200 MG Tb12 Take 1 tablet (1,200 mg  total) by mouth 2 (two) times daily.   montelukast 10 MG tablet Commonly known as:  SINGULAIR Take 10 mg by mouth at bedtime.   predniSONE 20 MG tablet Commonly known as:  DELTASONE Take 2 tablets (40 mg total) by mouth daily with breakfast for 2 days. Start taking on:  12/20/2017 What changed:    medication strength  how much to take  when to take this  Another medication with the same name was removed. Continue taking this medication, and follow the directions you see here.   promethazine-dextromethorphan 6.25-15 MG/5ML syrup Commonly known as:  PROMETHAZINE-DM Take 5 mLs by mouth 4 (four) times daily as needed for cough.      Disposition and follow-up:   [name] was discharged from [facility] in Good condition.  At the hospital follow up visit please address:  1.  Asthma exacerbation: Patient just moved from [place] and does not yet have PCP here. She will establish care with [place] in clinic. Please assist with chronic management of asthma. She may need Pulmonology referral. Please assess need for this. She uses her albuterol inhaler 10-12 times daily and does wake up multiple times every night with shortness of breath. She has never been hospitalized for an asthma exacerbation. She was provided an albuterol inhaler to use as needed and Breo inhaler on discharge to be used every day. She gets tongue swelling with Advair and Symbicort. Please assess whether she is tolerating Breo.  2.  Labs / imaging needed at  time of follow-up: None  3.  Pending labs/ test needing follow-up: None  Follow-up Appointments: Follow-up Information    [place]. Schedule an appointment as soon as possible for a visit in 1 week(s).   Why:  please call the clinic tomorrow to make an appointment in 1 week. Contact information: [address] [employer] [place] [facility] Course by problem list: Active Problems:    Asthma exacerbation   1. Asthma exacerbation Patient has been feeling congested, fever-ish, myalgias, and cough for the week prior to admission. +Sick contacts. She became more short of breath on 12/29 and presented to Urgent Care where she was started on azithromycin x5d (12/29-1/2). PE work-up negative in the ED. She received steroids, nebulizer treatments, and IV Mg. Flu negative. RVP positive for rhinovirus. Exacerbation likely triggered by viral respiratory infection. CXR negative for consolidation, however we elected to continue her course of antibiotics that she had already initiated outpatient (last day 1/2). Her symptoms improved. She has not yet established care with a PCP in [place]. We will try to set her up with the [facility] clinic as PCP. She was discharged with albuterol inhaler and Breo inhaler in hand. She has allergies to Advair and Symbicort (she states her tongue gets swollen). If she gets a reaction to [facility], she was instructed to stop taking this medication and to follow up with her doctor. Also discharged with prescription for prednisone and tessalon pearls.  Discharge Vitals:   BP 127/65 (BP Location: Left Arm)   Pulse 66   Temp 97.9 F (36.6 C) (Axillary)   Resp 20   Ht 5\' 4"  (1.626 m)   Wt 185 lb 6.5 oz (84.1 kg)   LMP 12/05/2017   SpO2 99%   BMI 31.83 kg/m   Pertinent Labs, Studies, and Procedures:  CBC Latest Ref Rng & Units 12/18/2017 12/17/2017  WBC 4.0 - 10.5 K/uL 10.5 10.2  Hemoglobin 12.0 - 15.0 g/dL 12.1 12.3  Hematocrit 36.0 - 46.0 % 37.7 38.2  Platelets 150 - 400 K/uL 254 260   CMP Latest Ref Rng & Units 12/19/2017 12/18/2017 12/17/2017  Glucose 65 - 99 mg/dL 141(H) 212(H) 96  BUN 6 - 20 mg/dL 16 12 13   Creatinine 0.44 - 1.00 mg/dL 0.69 0.77 0.73  Sodium 135 - 145 mmol/L 137 134(L) 136  Potassium 3.5 - 5.1 mmol/L 5.0 3.1(L) 3.6  Chloride 101 - 111 mmol/L 102 100(L) 102  CO2 22 - 32 mmol/L 27 22 24   Calcium 8.9 - 10.3 mg/dL 8.6(L) 8.6(L) 9.0   D-dimer  0.52  CXR 12/17/2017 No active cardiopulmonary disease.  CT-A 12/18/2017 No pulmonary embolus identified. Clear lungs. Normal CTA of the chest.  Discharge Instructions: Discharge Instructions    Call MD for:  difficulty breathing, headache or visual disturbances   Complete by:  As directed    Call MD for:  persistant nausea and vomiting   Complete by:  As directed    Call MD for:  redness, tenderness, or signs of infection (pain, swelling, redness, odor or green/yellow discharge around incision site)   Complete by:  As directed    Call MD for:  severe uncontrolled pain   Complete by:  As directed    Call MD for:  temperature >100.4   Complete by:  As directed    Diet - low sodium heart healthy   Complete by:  As directed    Increase activity  slowly   Complete by:  As directed      Signed: Colbert Ewing, MD 12/19/2017, 12:27 PM   Pager: Mamie Nick [phone]

## 2017-12-18 NOTE — ED Notes (Signed)
Ambulated patient with pulse ox on room air Began at 88% lying in bed Walking at 97%  Returned to bed and dropped to 90%

## 2017-12-18 NOTE — ED Notes (Signed)
Pt called x2 for vitals, no response.

## 2017-12-18 NOTE — Discharge Instructions (Addendum)
Return to the ED with any concerns including difficulty breathing despite using albuterol every 4 hours, not drinking fluids, decreased urine output, vomiting and not able to keep down liquids or medications, decreased level of alertness/lethargy, or any other alarming symptoms °

## 2017-12-18 NOTE — ED Notes (Signed)
RT aware of CAT order

## 2017-12-18 NOTE — ED Triage Notes (Signed)
Pt presents on return to ED with worsening shob and chest tightness after leaving ED several hours ago. Pt states she used home nebs x 2 and MDI without relief.

## 2017-12-18 NOTE — ED Notes (Signed)
Pt remains in triage #3 receiving continuous neb tx. Pt states she feels better but feels shaky now. Laughing and conversing with visitor in room.

## 2017-12-19 DIAGNOSIS — J069 Acute upper respiratory infection, unspecified: Secondary | ICD-10-CM

## 2017-12-19 LAB — RESPIRATORY PANEL BY PCR
Adenovirus: NOT DETECTED
BORDETELLA PERTUSSIS-RVPCR: NOT DETECTED
Chlamydophila pneumoniae: NOT DETECTED
Coronavirus 229E: NOT DETECTED
Coronavirus HKU1: NOT DETECTED
Coronavirus NL63: NOT DETECTED
Coronavirus OC43: NOT DETECTED
INFLUENZA B-RVPPCR: NOT DETECTED
Influenza A: NOT DETECTED
METAPNEUMOVIRUS-RVPPCR: NOT DETECTED
Mycoplasma pneumoniae: NOT DETECTED
PARAINFLUENZA VIRUS 2-RVPPCR: NOT DETECTED
PARAINFLUENZA VIRUS 3-RVPPCR: NOT DETECTED
Parainfluenza Virus 1: NOT DETECTED
Parainfluenza Virus 4: NOT DETECTED
RESPIRATORY SYNCYTIAL VIRUS-RVPPCR: NOT DETECTED
RHINOVIRUS / ENTEROVIRUS - RVPPCR: DETECTED — AB

## 2017-12-19 LAB — BASIC METABOLIC PANEL
Anion gap: 8 (ref 5–15)
BUN: 16 mg/dL (ref 6–20)
CHLORIDE: 102 mmol/L (ref 101–111)
CO2: 27 mmol/L (ref 22–32)
CREATININE: 0.69 mg/dL (ref 0.44–1.00)
Calcium: 8.6 mg/dL — ABNORMAL LOW (ref 8.9–10.3)
GFR calc Af Amer: >60 mL/min (ref >60)
GFR calc non Af Amer: >60 mL/min (ref >60)
Glucose, Bld: 141 mg/dL — ABNORMAL HIGH (ref 65–99)
Potassium: 5 mmol/L (ref 3.5–5.1)
Sodium: 137 mmol/L (ref 135–145)

## 2017-12-19 LAB — HIV ANTIBODY (ROUTINE TESTING W REFLEX): HIV Screen 4th Generation wRfx: NONREACTIVE

## 2017-12-19 MED ORDER — BENZONATATE 100 MG PO CAPS: 100.0000 mg | ORAL_CAPSULE | Freq: Three times a day (TID) | ORAL | 0 refills | Status: AC | PRN

## 2017-12-19 MED ORDER — GUAIFENESIN ER 600 MG PO TB12
600.0000 mg | ORAL_TABLET | Freq: Two times a day (BID) | ORAL | Status: DC
  Administered 2017-12-19: 600 mg via ORAL
  Filled 2017-12-19: qty 1

## 2017-12-19 MED ORDER — PREDNISONE 20 MG PO TABS: 40.0000 mg | ORAL_TABLET | Freq: Every day | ORAL | 0 refills | Status: AC

## 2017-12-19 MED ORDER — BENZONATATE 100 MG PO CAPS
100.0000 mg | ORAL_CAPSULE | Freq: Three times a day (TID) | ORAL | Status: DC | PRN
  Administered 2017-12-19: 100 mg via ORAL
  Filled 2017-12-19: qty 1

## 2017-12-19 NOTE — Discharge Instructions (Addendum)
Ms. Hampshire,  Please take prednisone 40mg  daily for 3 more days. The last day will be Jan 4. We have provided you with an albuterol inhaler. Please use 1-2 puffs every 6 hours as needed for wheezing. We also provided you with a Breo ellipta inhaler. Please use 1 inhalation every day. If you start to develop tongue swelling or another reaction, please stop this medicine until you speak to a doctor. Please take the rest of your azithromycin 250mg  daily (last day will be 1/2).  Please call our clinic to set up care with a primary care doctor who can help manage your asthma as an outpatient and who can help give you refills for your medicines. The number for our clinic is [phone]. Our clinic is closed today, but you can call this number tomorrow to set up an appointment in 1 week. Our address is [place]. We are located in the same building as the main hospital, on the floor below the Emergency Department.

## 2017-12-19 NOTE — Progress Notes (Signed)
Subjective:  Erin Collins was seen sitting up in bed in NAD this morning. She reports her breathing and chest tightness are much improved. She does still have a cough but is unable to produce much sputum. She also endorses that her legs feel achy, which she states normally happens when she is sitting around too much. She denies diffuse myalgias or subjective fevers. She states she is not on an inhaled corticosteroid at home, but that she has tried Advair and Symbicort in the past, and both of them make her tongue swell after a week of therapy.  Objective:  Vital signs in last 24 hours: Vitals:   12/18/17 1705 12/18/17 2032 12/18/17 2203 12/19/17 0443  BP: (!) 117/57  115/69 127/65  Pulse: (!) 111  (!) 109 66  Resp: 18  18 20   Temp: 98.1 F (36.7 C)  98.2 F (36.8 C) 97.9 F (36.6 C)  TempSrc: Oral  Oral Axillary  SpO2: 94% 98% 93% 99%  Weight: 185 lb 6.5 oz (84.1 kg)     Height:       GEN: Well-appearing, well-nourished young female sitting up in bed comfortably. Alert and oriented. No acute distress. Nonproductive cough. HENT: [place]/AT. Moist mucous membranes. No visible lesions. EYES: PERRL. Sclera non-icteric. Conjunctiva clear. RESP: Clear to auscultation bilaterally. No wheezes, rales, or rhonchi. Moving air well. No increased work of breathing. CV: Normal rate and regular rhythm. No murmurs, gallops, or rubs. Trace LE edema. ABD: Soft. Non-tender. Non-distended. Normoactive bowel sounds. EXT: Trace LE edema. Warm and well perfused. NEURO: Cranial nerves II-XII grossly intact. Able to lift all four extremities against gravity. No apparent audiovisual hallucinations. Speech fluent and appropriate. PSYCH: Patient is calm and pleasant. Appropriate affect. Well-groomed; speech is appropriate and on-subject.  Labs CBC Latest Ref Rng & Units 12/18/2017 12/17/2017  WBC 4.0 - 10.5 K/uL 10.5 10.2  Hemoglobin 12.0 - 15.0 g/dL 12.1 12.3  Hematocrit 36.0 - 46.0 % 37.7 38.2  Platelets 150 -  400 K/uL 254 260   CMP Latest Ref Rng & Units 12/19/2017 12/18/2017 12/17/2017  Glucose 65 - 99 mg/dL 141(H) 212(H) 96  BUN 6 - 20 mg/dL 16 12 13   Creatinine 0.44 - 1.00 mg/dL 0.69 0.77 0.73  Sodium 135 - 145 mmol/L 137 134(L) 136  Potassium 3.5 - 5.1 mmol/L 5.0 3.1(L) 3.6  Chloride 101 - 111 mmol/L 102 100(L) 102  CO2 22 - 32 mmol/L 27 22 24   Calcium 8.9 - 10.3 mg/dL 8.6(L) 8.6(L) 9.0   RVP positive for rhinovirus Flu negative  Assessment/Plan:  Active Problems:   Asthma exacerbation  Erin Collins is a 42yo female with PMH significant for asthma who presents with increased shortness of breath and chest tightness, unrelieved by albuterol inhaler or nebulizer treatments, most consistent with asthma exacerbation.  Asthma exacerbation Triggered by viral respiratory infection. She reports her breathing and chest tightness are improved, after nebulizer and steroid treatments. O2 sats stable on RA. No wheezing on exam, continues to have viral-like symptoms. RVP positive for rhinovirus. Will transition to PO prednisone today. She is much improved and will likely be able to go home today. - Albuterol nebulizer 2.5mg  q2h - Ipratropium nebulizer 0.5mg  q4h - Albuterol inhaler 1-2 puffs q6h PRN for wheezing or SOB - Azithromycin 250mg  daily x5 days total (12/29-1/2) - Continue home montelukast 10mg  daily - PO prednisone 40mg  daily (stop date 1/4) - Tessalon pearls 100mg  TID PRN for cough - Droplet precautions - HIV Ab screen - Likely discharge later  this afternoon. - Will need to establish care with a PCP here - She will likely need an inhaled corticosteroid to help manage her asthma symptoms outpatient. Allergies to Advair and Symbicort, but there are many other alternatives, like Dulera.  Dispo: Anticipated discharge in approximately 0-1 days.  Colbert Ewing, MD 12/19/2017, 9:24 AM Pager: Mamie Nick [phone]

## 2017-12-26 ENCOUNTER — Ambulatory Visit: Payer: Self-pay | Admitting: Internal Medicine

## 2017-12-26 ENCOUNTER — Other Ambulatory Visit: Payer: Self-pay

## 2017-12-26 VITALS — BP 134/73 | HR 87 | Temp 98.0°F | Ht 69.0 in | Wt 190.0 lb

## 2017-12-26 DIAGNOSIS — Z8619 Personal history of other infectious and parasitic diseases: Secondary | ICD-10-CM

## 2017-12-26 DIAGNOSIS — J4540 Moderate persistent asthma, uncomplicated: Secondary | ICD-10-CM | POA: Insufficient documentation

## 2017-12-26 DIAGNOSIS — Z7951 Long term (current) use of inhaled steroids: Secondary | ICD-10-CM

## 2017-12-26 DIAGNOSIS — J45909 Unspecified asthma, uncomplicated: Secondary | ICD-10-CM

## 2017-12-26 MED ORDER — FLUTICASONE FUROATE-VILANTEROL 200-25 MCG/INH IN AEPB: 1.0000 | INHALATION_SPRAY | Freq: Every day | RESPIRATORY_TRACT | 11 refills | Status: DC

## 2017-12-26 MED ORDER — MONTELUKAST SODIUM 10 MG PO TABS: 10.0000 mg | ORAL_TABLET | Freq: Every day | ORAL | 5 refills | Status: DC

## 2017-12-26 NOTE — Progress Notes (Signed)
   CC: Hospital follow-up for asthma exacerbation  HPI:  [name] is a 42 y.o. female with history noted below that presents to the acute care clinic for follow-up on recent hospitalization for asthma exacerbation. Patient states her symptoms have improved since discharge. She reports using Breo 1 puff once daily.  She states that she has only had to use her albuterol inhaler 3 times in the past week. This is a change from her having to use her albuterol inhaler 10-12 times daily. She denies chest pain, shortness of breath or wheezing.  Past Medical History:  Diagnosis Date  . Anemia   . Asthma   . Basal cell carcinoma ~ 1999   collarbone  . Blood clot in vein ~ 2008   "left breast"   . Complication of anesthesia    "I have trouble waking up afterwards" (12/18/2017)  . Family history of adverse reaction to anesthesia    "mom has trouble waking up afterwards" (12/18/2017)  . Fluttering heart    "comes and goes" (12/18/2017)  . GERD (gastroesophageal reflux disease)   . Headache    "weekly" (12/18/2017)  . History of kidney stones   . 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 Torticollis   . Vertigo     Review of Systems:  As noted per HPI  Physical Exam:  Vitals:   12/26/17 1432  BP: 134/73  Pulse: 87  Temp: 98 F (36.7 C)  TempSrc: Oral  SpO2: 98%  Weight: 190 lb (86.2 kg)  Height: 5\' 9"  (1.753 m)   Physical Exam  Constitutional: She is well-developed, well-nourished, and in no distress.  Cardiovascular: Normal rate, regular rhythm and normal heart sounds. Exam reveals no gallop and no friction rub.  No murmur heard. Pulmonary/Chest: Effort normal and breath sounds normal. No respiratory distress. She has no wheezes. She has no rales.     Assessment & Plan:   See encounters tab for problem based medical decision making.   Patient discussed with Dr. Daryll Drown

## 2017-12-26 NOTE — Patient Instructions (Addendum)
Ms. Alles,  It was a pleasure seeing you again. Please follow up in 3 months.

## 2017-12-26 NOTE — Progress Notes (Signed)
Internal Medicine Clinic Attending  Case discussed with Dr. Hoffman at the time of the visit.  We reviewed the resident's history and exam and pertinent patient test results.  I agree with the assessment, diagnosis, and plan of care documented in the resident's note.  

## 2017-12-26 NOTE — Assessment & Plan Note (Signed)
Assessment: Asthma  Patient was recently hospitalized on 12/31 for an acute asthma exacerbation.  Exacerbation was likely triggered by viral respiratory infection as she was found to be positive for rhinovirus. She states that her respiratory symptoms have improved since discharge. She denies any wheezing or shortness of breath. She has been using her Breo inhaler daily with minimal use of albuterol inhaler. At this time patient seems to be well controlled on inhalers. Will defer referring to pulmonology at this time. Will refill Breo inhaler and Singulair.  Plan -Refill Breo inhaler - Refill singular

## 2018-01-19 ENCOUNTER — Ambulatory Visit: Payer: Self-pay

## 2018-02-07 ENCOUNTER — Encounter: Payer: Self-pay | Admitting: *Deleted

## 2018-02-08 ENCOUNTER — Encounter: Payer: Self-pay | Admitting: Internal Medicine

## 2018-03-26 ENCOUNTER — Telehealth: Payer: Self-pay

## 2018-03-26 NOTE — Telephone Encounter (Signed)
Scheduled appt w/ Dr. Maudie Mercury tomorrow 03/27/18 @ 2pm. Thank you!

## 2018-03-26 NOTE — Telephone Encounter (Signed)
We carry samples, she can schedule appointment with me so I can help with samples and apply with patient for an assistance program so she can continue to get the [facility] once her samples run out.  Thank you!

## 2018-03-26 NOTE — Telephone Encounter (Signed)
Requesting a sample for breo inhaler. Please call pt back.

## 2018-03-27 ENCOUNTER — Ambulatory Visit: Payer: Self-pay | Admitting: Pharmacist

## 2018-03-27 DIAGNOSIS — J45901 Unspecified asthma with (acute) exacerbation: Secondary | ICD-10-CM

## 2018-03-27 MED ORDER — ALBUTEROL SULFATE HFA 108 (90 BASE) MCG/ACT IN AERS: 1.0000 | INHALATION_SPRAY | Freq: Four times a day (QID) | RESPIRATORY_TRACT | 0 refills | Status: DC | PRN

## 2018-03-27 MED ORDER — MOMETASONE FURO-FORMOTEROL FUM 200-5 MCG/ACT IN AERO: 2.0000 | INHALATION_SPRAY | Freq: Two times a day (BID) | RESPIRATORY_TRACT | 3 refills | Status: DC

## 2018-03-27 MED ORDER — MONTELUKAST SODIUM 10 MG PO TABS: 10.0000 mg | ORAL_TABLET | Freq: Every day | ORAL | 5 refills | Status: DC

## 2018-03-27 MED FILL — MONTELUKAST SOD 10 MG TAB: 10 | 30 days supply | Qty: 30 | Fill #0

## 2018-03-27 MED FILL — VENTOLIN HFA 90 MCG INHALER: 108 (90 BAS | 25 days supply | Qty: 18 | Fill #0

## 2018-03-27 MED FILL — DULERA 200 MCG/5 MCG INH: 200-5 | 30 days supply | Qty: 13 | Fill #0

## 2018-03-27 NOTE — Telephone Encounter (Signed)
Thank you, Charleston Ropes! Saw patient today and set her inhalers up.

## 2018-03-27 NOTE — Progress Notes (Signed)
Patient needed help obtaining asthma medications. Insurance will become active in June. Referred patient to [place], interchanged Breo to [place]. Advised patient to contact clinic if any questions or concerns arise. Patient verbalized understanding.

## 2018-03-29 ENCOUNTER — Encounter: Payer: Self-pay | Admitting: Internal Medicine

## 2018-05-02 NOTE — Progress Notes (Signed)
   CC: follow up on chronic asthma  HPI:  [name] is a 42 y.o. female with history noted below that presents to the internal medicine clinic for follow-up on chronic asthma.  Please see problem based charting for the status of patient's chronic medical conditions.  Past Medical History:  Diagnosis Date  . Anemia   . Asthma   . Basal cell carcinoma ~ 1999   collarbone  . Blood clot in vein ~ 2008   "left breast"   . Complication of anesthesia    "I have trouble waking up afterwards" (12/18/2017)  . Family history of adverse reaction to anesthesia    "mom has trouble waking up afterwards" (12/18/2017)  . Fluttering heart    "comes and goes" (12/18/2017)  . GERD (gastroesophageal reflux disease)   . Headache    "weekly" (12/18/2017)  . History of kidney stones   . 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 Torticollis   . Vertigo     Review of Systems:  Review of Systems  Constitutional: Negative for malaise/fatigue.  Respiratory: Negative for cough, shortness of breath and wheezing.      Physical Exam:  Vitals:   05/03/18 1428  BP: 123/69  Pulse: 85  Temp: 98.2 F (36.8 C)  TempSrc: Oral  SpO2: 97%  Weight: 160 lb 4.8 oz (72.7 kg)  Height: 5\' 4"  (1.626 m)   Physical Exam  Constitutional: She is well-developed, well-nourished, and in no distress.  Cardiovascular: Normal rate, regular rhythm and normal heart sounds. Exam reveals no gallop and no friction rub.  No murmur heard. Pulmonary/Chest: Effort normal and breath sounds normal. No respiratory distress. She has no wheezes. She has no rales.  Musculoskeletal: She exhibits no edema.  Skin: Skin is warm and dry.     Assessment & Plan:   See encounters tab for problem based medical decision making.    Patient discussed with Dr. Dareen Piano

## 2018-05-03 ENCOUNTER — Other Ambulatory Visit: Payer: Self-pay

## 2018-05-03 ENCOUNTER — Ambulatory Visit: Payer: Self-pay | Admitting: Pharmacist

## 2018-05-03 ENCOUNTER — Ambulatory Visit (INDEPENDENT_AMBULATORY_CARE_PROVIDER_SITE_OTHER): Payer: Self-pay | Admitting: Internal Medicine

## 2018-05-03 ENCOUNTER — Encounter: Payer: Self-pay | Admitting: Internal Medicine

## 2018-05-03 DIAGNOSIS — Z7951 Long term (current) use of inhaled steroids: Secondary | ICD-10-CM

## 2018-05-03 DIAGNOSIS — Z0000 Encounter for general adult medical examination without abnormal findings: Secondary | ICD-10-CM

## 2018-05-03 DIAGNOSIS — J45909 Unspecified asthma, uncomplicated: Secondary | ICD-10-CM

## 2018-05-03 NOTE — Patient Instructions (Signed)
Erin Collins,  It was a pleasure seeing you today. Please follow-up in the next 2-3 months with me. If you need to be seen sooner please follow up in the acute care clinic.

## 2018-05-03 NOTE — Progress Notes (Signed)
Patient states that [facility] (mometasone/formoterol) does not help her asthma and that she has to use her rescue inhaler ATC. She states that [facility] (fluticasone/vilanterol) is the only medication that works for her. Unable to provide patient with Breo sample in clinic.She states that she will pick up her prescription of Breo once her insurance begins next month. Provided patient with spacer to help with [facility]. Counseled patient that per 2019 GINA guidelines, first line for rescue inhaler is an ICS +/- SABA. Patient expressed understanding of how to use spacer and what to take as a rescue inhaler.   Patient states that she experienced tongue swelling with advair (fluticasone/salmeterol) and symbicort (budesonide/formoterol). Patient may be allergic to salmeterol and budesonide.

## 2018-05-05 NOTE — Progress Notes (Signed)
Patient was seen today in a co-visit between the physician and pharmacist.  During this visit, discussed ICS/LABA inhaler options with patient. See documentation under Dr. Jodene Nam visit for details.

## 2018-05-06 DIAGNOSIS — Z0000 Encounter for general adult medical examination without abnormal findings: Secondary | ICD-10-CM | POA: Insufficient documentation

## 2018-05-06 NOTE — Assessment & Plan Note (Addendum)
Assessment: Chronic Asthma Patient has a history of asthma with previous hospitalization on 11/2017. Since then patient has not had any asthma exacerbations.  She does state that she continues to have chest tightness at night which is relieved by her rescue inhaler.  She states she uses Breo daily however she is soon to run out and unable to afford her inhaler due to lack of insurance.  She has worked with Dr. Maudie Mercury to help with a solution by the time her insurance kicks in on June 1.  Ruthe Mannan was made affordable however patient states this does not help as much as Breo.  She reports tongue swelling to symbicort and advair.  At this time there are no office samples of Breo or other offers for financial aid.  I offered Trelegy as a temporary option for the next two weeks, however patient declined the in office sample.  At this time will need to continue with [facility] until insurance is available in two weeks.  Plan  -Continue Dulera inhaler and Singulair - Send breo to the pharmacy when insurance available - need to consider PFTs at next visit

## 2018-05-06 NOTE — Assessment & Plan Note (Signed)
Assessment: Healthcare maintenance Patient is due for TdAP and Pap smear.  Both were offered in office however patient would like to wait until she has insurance which she states should take into effect June 1st.  Plan -Tdap and Pap smear at next visit

## 2018-05-07 NOTE — Progress Notes (Signed)
Internal Medicine Clinic Attending  Case discussed with Dr. Hoffman at the time of the visit.  We reviewed the resident's history and exam and pertinent patient test results.  I agree with the assessment, diagnosis, and plan of care documented in the resident's note.  

## 2018-08-24 MED FILL — MONTELUKAST SOD 10 MG TAB: 10 | 30 days supply | Qty: 30 | Fill #1

## 2018-08-28 ENCOUNTER — Other Ambulatory Visit: Payer: Self-pay | Admitting: *Deleted

## 2018-08-28 DIAGNOSIS — J45901 Unspecified asthma with (acute) exacerbation: Secondary | ICD-10-CM

## 2018-08-28 MED ORDER — ALBUTEROL SULFATE HFA 108 (90 BASE) MCG/ACT IN AERS: 1.0000 | INHALATION_SPRAY | Freq: Four times a day (QID) | RESPIRATORY_TRACT | 1 refills | Status: DC | PRN

## 2018-08-29 MED FILL — VENTOLIN HFA 90 MCG INHALER: 108 (90 BAS | 25 days supply | Qty: 18 | Fill #0

## 2018-09-27 ENCOUNTER — Encounter: Payer: Self-pay | Admitting: Internal Medicine

## 2018-09-27 ENCOUNTER — Ambulatory Visit: Payer: BLUE CROSS/BLUE SHIELD | Admitting: Internal Medicine

## 2018-09-27 ENCOUNTER — Other Ambulatory Visit: Payer: Self-pay

## 2018-09-27 DIAGNOSIS — Z79899 Other long term (current) drug therapy: Secondary | ICD-10-CM

## 2018-09-27 DIAGNOSIS — J45909 Unspecified asthma, uncomplicated: Secondary | ICD-10-CM

## 2018-09-27 DIAGNOSIS — Z0000 Encounter for general adult medical examination without abnormal findings: Secondary | ICD-10-CM

## 2018-09-27 MED ORDER — MONTELUKAST SODIUM 10 MG PO TABS: 10.0000 mg | ORAL_TABLET | Freq: Every day | ORAL | 5 refills | Status: DC

## 2018-09-27 MED ORDER — FLUTICASONE FUROATE-VILANTEROL 200-25 MCG/INH IN AEPB: 1.0000 | INHALATION_SPRAY | Freq: Every day | RESPIRATORY_TRACT | 11 refills | Status: DC

## 2018-09-27 NOTE — Progress Notes (Signed)
   CC: follow up for asthma  HPI:  [name] is a 42 y.o. female with history noted below that presents to the internal medicine clinic for follow-up on asthma. Please see probation for the status of patient's chronic conditions.  Past Medical History:  Diagnosis Date  . Anemia   . Asthma   . Basal cell carcinoma ~ 1999   collarbone  . Blood clot in vein ~ 2008   "left breast"   . Complication of anesthesia    "I have trouble waking up afterwards" (12/18/2017)  . Family history of adverse reaction to anesthesia    "mom has trouble waking up afterwards" (12/18/2017)  . Fluttering heart    "comes and goes" (12/18/2017)  . GERD (gastroesophageal reflux disease)   . Headache    "weekly" (12/18/2017)  . History of kidney stones   . 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 Torticollis   . Vertigo     Review of Systems:  Review of Systems  Respiratory: Negative for cough, shortness of breath and wheezing.   Cardiovascular: Negative for chest pain and leg swelling.     Physical Exam:  Vitals:   09/27/18 1432  BP: 112/66  Pulse: 75  Temp: 98 F (36.7 C)  TempSrc: Oral  SpO2: 98%  Weight: 172 lb 3.2 oz (78.1 kg)   Physical Exam  Constitutional: She is well-developed, well-nourished, and in no distress.  Cardiovascular: Normal rate, regular rhythm and normal heart sounds. Exam reveals no gallop and no friction rub.  No murmur heard. Pulmonary/Chest: Effort normal and breath sounds normal. No respiratory distress. She has no wheezes. She has no rales.     Assessment & Plan:   See encounters tab for problem based medical decision making.   Patient discussed with Dr. Dareen Piano

## 2018-09-27 NOTE — Patient Instructions (Signed)
Erin Collins,  It was a pleasure seeing you today. Please follow up in one year or sooner as needed.  Refills have been sent to your pharmacy. Referrals have been made for OB/GYN, ophthalmology, and allergy/immunology.

## 2018-10-04 ENCOUNTER — Telehealth: Payer: Self-pay | Admitting: *Deleted

## 2018-10-04 MED ORDER — ALBUTEROL SULFATE HFA 108 (90 BASE) MCG/ACT IN AERS: 1.0000 | INHALATION_SPRAY | Freq: Four times a day (QID) | RESPIRATORY_TRACT | 10 refills | Status: DC | PRN

## 2018-10-04 NOTE — Assessment & Plan Note (Signed)
Assessment: Healthcare maintenance Patient is due for Pap smear. She is asking for an OB/GYN referral.  She would also like a referral to ophthalmology for eye exam.  Plan -OB/GYN referral opthalmology referral

## 2018-10-04 NOTE — Assessment & Plan Note (Signed)
Assessment: History of asthma Patient reports using Breo daily.  She states this controls her asthma symptoms. She has not had an asthma exacerbation since hospitalization last year. Will continue with current treatment.  Patient also interested in an allergy/immunology referral for discussion of allergy shots and discussion of further asthma treatments.  Plan -Refill [facility] -allergy/immunology referral

## 2018-10-04 NOTE — Telephone Encounter (Signed)
LVM FOR  PATIENT TO CALL [facility] AS TO WHERE SHE WOULD LIKE TO GO FOR HER EYE EXAM / PATIENT LIVES IN [place].

## 2018-10-04 NOTE — Progress Notes (Signed)
Internal Medicine Clinic Attending  Case discussed with Dr. Hoffman at the time of the visit.  We reviewed the resident's history and exam and pertinent patient test results.  I agree with the assessment, diagnosis, and plan of care documented in the resident's note.  

## 2018-10-05 ENCOUNTER — Encounter: Payer: Self-pay | Admitting: *Deleted

## 2018-10-05 ENCOUNTER — Telehealth: Payer: Self-pay | Admitting: *Deleted

## 2018-10-05 NOTE — Telephone Encounter (Signed)
CALLED PATIENT LVM FOR PATIENT TO RETURN CALL TO [facility] REGARDING EYE REFERRAL AS TO WHERE SHE WOULD LIKE TO GO /[place] OR [place].

## 2018-10-10 ENCOUNTER — Encounter: Payer: Self-pay | Admitting: *Deleted

## 2018-11-08 NOTE — Addendum Note (Signed)
Addended by: Hulan Fray on: 11/08/2018 07:06 AM   Modules accepted: Orders

## 2018-11-28 ENCOUNTER — Ambulatory Visit: Payer: BLUE CROSS/BLUE SHIELD | Admitting: Allergy

## 2018-11-28 ENCOUNTER — Encounter: Payer: Self-pay | Admitting: Allergy

## 2018-11-28 VITALS — BP 134/90 | HR 88 | Temp 98.6°F | Resp 16 | Ht 64.0 in | Wt 178.6 lb

## 2018-11-28 DIAGNOSIS — J3089 Other allergic rhinitis: Secondary | ICD-10-CM

## 2018-11-28 DIAGNOSIS — J4540 Moderate persistent asthma, uncomplicated: Secondary | ICD-10-CM

## 2018-11-28 DIAGNOSIS — L2089 Other atopic dermatitis: Secondary | ICD-10-CM

## 2018-11-28 DIAGNOSIS — Z889 Allergy status to unspecified drugs, medicaments and biological substances status: Secondary | ICD-10-CM

## 2018-11-28 DIAGNOSIS — H1013 Acute atopic conjunctivitis, bilateral: Secondary | ICD-10-CM | POA: Diagnosis not present

## 2018-11-28 MED ORDER — TIOTROPIUM BROMIDE MONOHYDRATE 1.25 MCG/ACT IN AERS: 2.0000 | INHALATION_SPRAY | Freq: Every day | RESPIRATORY_TRACT | 5 refills | Status: DC

## 2018-11-28 NOTE — Patient Instructions (Addendum)
Today's testing showed: Positive to dust mites, cats and dogs  . Daily controller medication(s): Breo 200 1 puff once a day and rinse mouth afterwards + Singulair 10mg  daily o ADD on Spiriva 1.70mcg 2 puffs once a day. . Prior to physical activity: May use albuterol rescue inhaler 2 puffs 5 to 15 minutes prior to strenuous physical activities. Marland Kitchen Rescue medications: May use albuterol rescue inhaler 2 puffs or nebulizer every 4 to 6 hours as needed for shortness of breath, chest tightness, coughing, and wheezing. Monitor frequency of use.  . Asthma control goals:  o Full participation in all desired activities (may need albuterol before activity) o Albuterol use two times or less a week on average (not counting use with activity) o Cough interfering with sleep two times or less a month o Oral steroids no more than once a year o No hospitalizations  May use over the counter antihistamines such as Zyrtec (cetirizine), Claritin (loratadine), Allegra (fexofenadine), or Xyzal (levocetirizine) daily as needed.  May use Nasacort 1-2 sprays daily as needed. May use Zaditor eye drops as needed.   Continue to avoid drugs that bother you.  Follow up in 4 weeks.  Control of House Dust Mite Allergen . Dust mite allergens are a common trigger of allergy and asthma symptoms. While they can be found throughout the house, these microscopic creatures thrive in warm, humid environments such as bedding, upholstered furniture and carpeting. . Because so much time is spent in the bedroom, it is essential to reduce mite levels there.  . Encase pillows, mattresses, and box springs in special allergen-proof fabric covers or airtight, zippered plastic covers.  . Bedding should be washed weekly in hot water (130 F) and dried in a hot dryer. Allergen-proof covers are available for comforters and pillows that can't be regularly washed.  Wendee Copp the allergy-proof covers every few months. Minimize clutter in the bedroom.  Keep pets out of the bedroom.  Marland Kitchen Keep humidity less than 50% by using a dehumidifier or air conditioning. You can buy a humidity measuring device called a hygrometer to monitor this.  . If possible, replace carpets with hardwood, linoleum, or washable area rugs. If that's not possible, vacuum frequently with a vacuum that has a HEPA filter. . Remove all upholstered furniture and non-washable window drapes from the bedroom. . Remove all non-washable stuffed toys from the bedroom.  Wash stuffed toys weekly. Pet Allergen Avoidance: . Contrary to popular opinion, there are no "hypoallergenic" breeds of dogs or cats. That is because people are not allergic to an animal's hair, but to an allergen found in the animal's saliva, dander (dead skin flakes) or urine. Pet allergy symptoms typically occur within minutes. For some people, symptoms can build up and become most severe 8 to 12 hours after contact with the animal. People with severe allergies can experience reactions in public places if dander has been transported on the pet owners' clothing. Marland Kitchen Keeping an animal outdoors is only a partial solution, since homes with pets in the yard still have higher concentrations of animal allergens. . Before getting a pet, ask your allergist to determine if you are allergic to animals. If your pet is already considered part of your family, try to minimize contact and keep the pet out of the bedroom and other rooms where you spend a great deal of time. . As with dust mites, vacuum carpets often or replace carpet with a hardwood floor, tile or linoleum. . High-efficiency particulate air (HEPA) cleaners  can reduce allergen levels over time. . While dander and saliva are the source of cat and dog allergens, urine is the source of allergens from rabbits, hamsters, mice and [place] pigs; so ask a non-allergic family member to clean the animal's cage. . If you have a pet allergy, talk to your allergist about the potential for  allergy immunotherapy (allergy shots). This strategy can often provide long-term relief.  Skin care recommendations  Bath time: . Always use lukewarm water. AVOID very hot or cold water. Marland Kitchen Keep bathing time to 5-10 minutes. . Do NOT use bubble bath. . Use a mild soap and use just enough to wash the dirty areas. . Do NOT scrub skin vigorously.  . After bathing, pat dry your skin with a towel. Do NOT rub or scrub the skin.  Moisturizers and prescriptions:  . ALWAYS apply moisturizers immediately after bathing (within 3 minutes). This helps to lock-in moisture. . Use the moisturizer several times a day over the whole body. Kermit Balo summer moisturizers include: Aveeno, CeraVe, Cetaphil. Kermit Balo winter moisturizers include: Aquaphor, Vaseline, Cerave, Cetaphil, Eucerin, Vanicream. . When using moisturizers along with medications, the moisturizer should be applied about one hour after applying the medication to prevent diluting effect of the medication or moisturize around where you applied the medications. When not using medications, the moisturizer can be continued twice daily as maintenance.  Laundry and clothing: . Avoid laundry products with added color or perfumes. . Use unscented hypo-allergenic laundry products such as Tide free, Cheer free & gentle, and All free and clear.  . If the skin still seems dry or sensitive, you can try double-rinsing the clothes. . Avoid tight or scratchy clothing such as wool. . Do not use fabric softeners or dyer  sheets.

## 2018-11-28 NOTE — Progress Notes (Signed)
New Patient Note  RE: Erin Collins MRN: [id number] DOB: Jun 15, 1976 Date of Office Visit: 11/28/2018  Referring provider: Tarri Fuller* Primary care provider: Valinda Party, DO  Chief Complaint: Asthma; Allergies; and Eczema  History of Present Illness: I had the pleasure of seeing Erin Collins for initial evaluation at the [facility] on 11/28/2018. She is a 42 y.o. female, who is referred here by Valinda Party, DO for the evaluation of asthma and allergies.   Asthma: She reports symptoms of chest tightness, shortness of breath, coughing, wheezing, nocturnal awakenings for 19 years. Symptoms worse in the morning. Current medications include Breo 200 1 puff QD x on and off for 1 year and albuterol prn which help. She reports not using aerochamber with asthma inhalers. She tried the following inhalers: dulera which does not work as well, Advair and Symbicort causes tongue angioedema. Main asthma triggers are allergies. In the last month, frequency of asthma symptoms: daily in the morning. Frequency of nocturnal symptoms: daily. Frequency of SABA use: daily. Interference with physical activity: sometimes. Sleep is disturbed. No previous sleep study done and patient does snore.  In the last 12 months, emergency room visits/urgent care visits/doctor office visits or hospitalizations due to asthma: once in the ER. In the last 12 months, oral steroids courses: yes - once Lifetime history of hospitalization for asthma: one. Prior intubations: no. History of pneumonia: no. She was evaluated by allergist in the past. Smoking exposure: no. Up to date with flu vaccine: no.  Rhinitis: She reports symptoms of sneezing, chest congestion, rhinorrhea, nasal congestion, itchy/watery eyes. Symptoms have been going on for 20 years. The symptoms are present all year around with worsening in spring and fall. Anosmia: no. Headache: yes. She has used zyrtec, Singulair,  Nasacort, saline nasal mist, Zaditor with some improvement in symptoms. Sinus infections: once. Previous work up includes: over 18-19 years ago showed multiple positive per patient report. Patient was on allergy immunotherapy for about 10 years with good benefit. Large localized reactions to AIT in the past.  Previous ENT evaluation: no recent visit, last one was in 2015. Patient had sinus surgery in the past.  Previous sinus imaging: none recently.  Eczema: Usually flares on the hands only. Uses hydrocortisone 10% OTC and Vaseline with some benefit.   Assessment and Plan: Airyn is a 42 y.o. female with: Not well controlled moderate persistent asthma Diagnosed with asthma over 19 years ago. Currently on Breo 200 1 puff QD and albuterol as needed but still waking up every night due to SOB for the past 1 year requiring albuterol. Patient moved in with her parents who have cats and dogs at home during this time.  Today's spirometry showed: normal pattern and no improvement post bronchodilator treatment but patient also used albuterol 1 hour prior to visit.  She is most likely having issues due to the pet dander exposure at home. Discussed environmental control measures.  If below regimen does not control symptoms will get some bloodwork to see if she meets criteria for any of the biologics for asthma treatment.  . Daily controller medication(s): Breo 200 1 puff once a day and rinse mouth afterwards + Singulair 10mg  daily o ADD on Spiriva 1.21mcg 2 puffs once a day. Sample given and demonstrated proper use.  . Prior to physical activity: May use albuterol rescue inhaler 2 puffs 5 to 15 minutes prior to strenuous physical activities. Marland Kitchen Rescue medications: May use albuterol rescue inhaler 2  puffs or nebulizer every 4 to 6 hours as needed for shortness of breath, chest tightness, coughing, and wheezing. Monitor frequency of use.   Other allergic rhinitis Perennial rhinoconjunctivitis symptoms for  the past 20 years with worsening in the spring and fall.  Patient has tried Zyrtec, Singulair, Nasacort, saline nasal spray and Zaditor eyedrops with some benefit.  Patient used to be on allergy immunotherapy for 10 years with good benefit.  Today's skin testing showed, positive to dust mites, cat, dog, grass and horse.  Discussed environmental control measures.  May use over the counter antihistamines such as Zyrtec (cetirizine), Claritin (loratadine), Allegra (fexofenadine), or Xyzal (levocetirizine) daily as needed.  May use Nasacort 1-2 sprays daily as needed.  May use Zaditor eye drops as needed.   Discussed starting allergy immunotherapy but patient only going to be in area for another year or so.   Allergic conjunctivitis of both eyes See assessment and plan as above.   Other atopic dermatitis Flares on the hands.  Discussed proper skin care measures.  May use Eucrisa twice a day as needed. Sample given.   Multiple drug allergies Reactions to amoxicillin, cefuroxime, hydrocodone, levofloxacin, morphine, oxycodone, and propoxyphene in the past. Continue to avoid all these medications.   Return in about 4 weeks (around 12/26/2018).  Meds ordered this encounter  Medications  . Tiotropium Bromide Monohydrate (SPIRIVA RESPIMAT) 1.25 MCG/ACT AERS    Sig: Inhale 2 puffs into the lungs daily.    Dispense:  1 Inhaler    Refill:  5   Other allergy screening: Asthma: yes Rhino conjunctivitis: yes Food allergy: no  Sugar seems to trigger allergies and asthma.  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 No previous testing.  Urticaria: yes sometimes with contact.  Eczema:yes History of recurrent infections suggestive of immunodeficency:  no  Diagnostics: Spirometry:  Tracings reviewed. Her effort: Good reproducible efforts. FVC: 3.16L FEV1: 2.54L, 84% predicted FEV1/FVC ratio: 80% Interpretation: Spirometry consistent with normal pattern and no improvement post bronchodilator treatment but patient also used albuterol 1 hour prior to visit. Please see scanned spirometry results for details.  Skin Testing:  Positive test to: dust mites, grass, cat, dog and horse. Results discussed with patient/family. Airborne Adult Perc - 11/28/18 0910    Time Antigen Placed  0920    Allergen Manufacturer  Lavella Hammock    Location  Back    Number of Test  59    Panel 1  Select    1. Control-Buffer 50% Glycerol  Negative    2. Control-Histamine 1 mg/ml  3+    3. Albumin saline  Negative    4. [place]  Negative    5. [place]  Negative    6. [place]  Negative    7. [place] Blue  Negative    8. Meadow Fescue  Negative    9. Perennial Rye  3+    10. Sweet Vernal  Negative    11. Timothy  Negative    12. Cocklebur  Negative    13. Burweed Marshelder  Negative    14. Ragweed, short  Negative    15. Ragweed, Giant  Negative    16. Plantain,  English  Negative    17. Lamb's Quarters  Negative    18. Sheep Sorrell  Negative    19.  Rough Pigweed  Negative    20. Marsh Elder, Rough  Negative    21. Mugwort, Common  Negative    22. Ash mix  Negative    23. Birch mix  Negative    24. Beech American  Negative    25. Box, Elder  Negative    26. Cedar, red  Negative    27. Cottonwood, [place]  Negative    28. Elm mix  Negative    29. Hickory mix  Negative    30. Maple mix  Negative    31. Oak, [place] mix  Negative    32. Pecan Pollen  Negative    33. Pine mix  Negative    34. Sycamore Eastern  Negative    35. [place], Black Pollen  Negative    36. Alternaria alternata  Negative    37. Cladosporium Herbarum  Negative    38. Aspergillus mix  Negative    39. Penicillium mix  Negative    40. Bipolaris sorokiniana (Helminthosporium)   Negative    41. Drechslera spicifera (Curvularia)  Negative    42. Mucor plumbeus  Negative    43. Fusarium moniliforme  Negative    44. Aureobasidium pullulans (pullulara)  Negative    45. Rhizopus oryzae  Negative    46. Botrytis cinera  Negative    47. Epicoccum nigrum  Negative    48. Phoma betae  Negative    49. Candida Albicans  Negative    50. Trichophyton mentagrophytes  Negative    51. Mite, D Farinae  5,000 AU/ml  4+    52. Mite, D Pteronyssinus  5,000 AU/ml  4+    53. Cat Hair 10,000 BAU/ml  4+    54.  Dog Epithelia  2+    55. Mixed Feathers  Negative    56. Horse Epithelia  2+    57. Cockroach, German  Negative    58. Mouse  Negative    59. Tobacco Leaf  Negative     Intradermal - 11/28/18 1000    Time Antigen Placed  1015    Allergen Manufacturer  Lavella Hammock    Location  Back    Number of Test  11    Intradermal  Select    Control  Negative    [place]  Negative    Johnson  Negative    Ragweed mix  Negative    Weed mix  Negative    Tree mix  Negative    Mold 1  Negative    Mold 2  Negative    Mold 3  Negative    Mold 4  Negative    Cockroach  Negative     Food Adult Perc - 11/28/18 0910    Time Antigen Placed  0920    Allergen Manufacturer  Lavella Hammock    Location  Back    Number of allergen test  12     Control-buffer 50% Glycerol  Negative    Control-Histamine 1 mg/ml  3+    1. Peanut  Negative    2. Soybean  Negative    3. Wheat  Negative    4. Sesame  Negative    5. Milk, cow  Negative    6. Egg White, Chicken  Negative    7. Casein  Negative    8. Shellfish Mix  Negative    9. Fish Mix  Negative    10. Cashew  Negative       Past Medical History: Patient Active  Problem List   Diagnosis Date Noted  . Other allergic rhinitis 11/28/2018  . Other atopic dermatitis 11/28/2018  . Allergic conjunctivitis of both eyes 11/28/2018  . Multiple drug allergies 11/28/2018  . Healthcare maintenance 05/06/2018  . Not well controlled moderate persistent asthma  12/26/2017  . Asthma exacerbation 12/18/2017   Past Medical History:  Diagnosis Date  . Anemia   . Angio-edema   . Asthma   . Basal cell carcinoma ~ 1999   collarbone  . Blood clot in vein ~ 2008   "left breast"   . Complication of anesthesia    "I have trouble waking up afterwards" (12/18/2017)  . Eczema   . Family history of adverse reaction to anesthesia    "mom has trouble waking up afterwards" (12/18/2017)  . Fluttering heart    "comes and goes" (12/18/2017)  . GERD (gastroesophageal reflux disease)   . Headache    "weekly" (12/18/2017)  . History of kidney stones   . Recurrent upper respiratory infection (URI)   . 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 Torticollis   . Vertigo    Past Surgical History: Past Surgical History:  Procedure Laterality Date  . BASAL CELL CARCINOMA EXCISION Right ~ 1999   "collarbone"  . DILATION AND CURETTAGE OF UTERUS  2005  . LAPAROSCOPIC ENDOMETRIOSIS FULGURATION  2005  . NASAL SEPTOPLASTY W/ TURBINOPLASTY Bilateral 2014  . SINOSCOPY    . TONSILLECTOMY  1980  . TUBAL LIGATION Right 2005  . TYMPANOSTOMY TUBE PLACEMENT     Medication List:  Current Outpatient Medications  Medication Sig Dispense Refill  . albuterol (ACCUNEB) 0.63 MG/3ML nebulizer solution Take 6 mLs (1.26 mg total) by nebulization every 4 (four) hours as needed for wheezing or shortness of breath. 75 mL 0  . albuterol (PROVENTIL HFA;VENTOLIN HFA) 108 (90 Base) MCG/ACT inhaler Inhale 1-2 puffs into the lungs every 6 (six) hours as needed for wheezing or shortness of breath. IM program, Ventolin if ok through 340B 1 Inhaler 10  . cetirizine (ZYRTEC) 10 MG tablet Take 10 mg by mouth daily.    . fluticasone furoate-vilanterol (BREO ELLIPTA) 200-25 MCG/INH AEPB Inhale 1 puff into the lungs daily. 60 each 11  . Guaifenesin 1200 MG TB12 Take 1 tablet (1,200 mg total) by mouth 2 (two) times daily. 20 each 0  . montelukast (SINGULAIR) 10 MG tablet Take 1 tablet (10 mg  total) by mouth at bedtime. IM program 30 tablet 5  . Tiotropium Bromide Monohydrate (SPIRIVA RESPIMAT) 1.25 MCG/ACT AERS Inhale 2 puffs into the lungs daily. 1 Inhaler 5   No current facility-administered medications for this visit.    Allergies: Allergies  Allergen Reactions  . Amoxicillin Hives  . Cefuroxime Axetil Hives  . Hydrocodone Itching  . Levofloxacin Other (See Comments)    tendonitis  . Morphine Itching  . Oxycodone-Acetaminophen Itching  . Penicillins Hives    Has patient had a PCN reaction causing immediate rash, facial/tongue/throat swelling, SOB or lightheadedness with hypotension: Yes Has patient had a PCN reaction causing severe rash involving mucus membranes or skin necrosis: No Has patient had a PCN reaction that required hospitalization: No Has patient had a PCN reaction occurring within the last 10 years: No If all of the above answers are "NO", then may proceed with Cephalosporin use.   Marland Kitchen Propoxyphene Itching   Social History: Social History   Socioeconomic History  . Marital status: Divorced    Spouse name: Not on file  .  Number of children: Not on file  . Years of education: Not on file  . Highest education level: Not on file  Occupational History  . Not on file  Social Needs  . Financial resource strain: Not on file  . Food insecurity:    Worry: Not on file    Inability: Not on file  . Transportation needs:    Medical: Not on file    Non-medical: Not on file  Tobacco Use  . Smoking status: Never Smoker  . Smokeless tobacco: Never Used  Substance and Sexual Activity  . Alcohol use: Yes    Comment: 12/18/2017 "maybe 1/year on my birthday"  . Drug use: No  . Sexual activity: Not Currently  Lifestyle  . Physical activity:    Days per week: Not on file    Minutes per session: Not on file  . Stress: Not on file  Relationships  . Social connections:    Talks on phone: Not on file    Gets together: Not on file    Attends religious  service: Not on file    Active member of club or organization: Not on file    Attends meetings of clubs or organizations: Not on file    Relationship status: Not on file  Other Topics Concern  . Not on file  Social History Narrative  . Not on file   Lives in a home built in the 40s. Smoking: denies Occupation: Research officer, political party History: Environmental education officer in the house: no Charity fundraiser in the family room: no Carpet in the bedroom: no Heating: electric Cooling: central Pet: yes 2 cats and 2 dogs  Family History: Family History  Problem Relation Age of Onset  . Allergic rhinitis Mother   . Asthma Mother    Review of Systems  Constitutional: Negative for appetite change, chills, fever and unexpected weight change.  HENT: Negative for congestion and rhinorrhea.   Eyes: Negative for itching.  Respiratory: Positive for chest tightness, shortness of breath and wheezing. Negative for cough.   Cardiovascular: Negative for chest pain.  Gastrointestinal: Negative for abdominal pain.  Genitourinary: Negative for difficulty urinating.  Skin: Positive for rash.  Allergic/Immunologic: Positive for environmental allergies. Negative for food allergies.  Neurological: Positive for headaches.   Objective: BP 134/90 (BP Location: Left Arm, Patient Position: Sitting, Cuff Size: Normal)   Pulse 88   Temp 98.6 F (37 C) (Oral)   Resp 16   Ht 5\' 4"  (1.626 m)   Wt 178 lb 9.6 oz (81 kg)   SpO2 97%   BMI 30.66 kg/m  Body mass index is 30.66 kg/m. Physical Exam  Constitutional: She is oriented to person, place, and time. She appears well-developed and well-nourished.  HENT:  Head: Normocephalic and atraumatic.  Right Ear: External ear normal.  Left Ear: External ear normal.  Nose: Nose normal.  Mouth/Throat: Oropharynx is clear and moist.  Eyes: Conjunctivae and EOM are normal.  Neck: Neck supple.  Cardiovascular: Normal rate, regular rhythm and normal heart  sounds. Exam reveals no gallop and no friction rub.  No murmur heard. Pulmonary/Chest: Effort normal and breath sounds normal. She has no wheezes. She has no rales.  Abdominal: Soft.  Lymphadenopathy:    She has no cervical adenopathy.  Neurological: She is alert and oriented to person, place, and time.  Skin: Skin is warm and dry. No rash noted.  Dry patch on dorsal surface on hands b/l  Psychiatric: She has a normal mood and affect.  Her behavior is normal.  Nursing note and vitals reviewed.  The plan was reviewed with the patient/family, and all questions/concerned were addressed.  It was my pleasure to see Abbiegail today and participate in her care. Please feel free to contact me with any questions or concerns.  Sincerely,  Rexene Alberts, DO Allergy & Immunology  [facility] office: [phone] [place]

## 2018-11-28 NOTE — Assessment & Plan Note (Signed)
.   See assessment and plan as above. 

## 2018-11-28 NOTE — Assessment & Plan Note (Signed)
Perennial rhinoconjunctivitis symptoms for the past 20 years with worsening in the spring and fall.  Patient has tried Zyrtec, Singulair, Nasacort, saline nasal spray and Zaditor eyedrops with some benefit.  Patient used to be on allergy immunotherapy for 10 years with good benefit.  Today's skin testing showed, positive to dust mites, cat, dog, grass and horse.  Discussed environmental control measures.  May use over the counter antihistamines such as Zyrtec (cetirizine), Claritin (loratadine), Allegra (fexofenadine), or Xyzal (levocetirizine) daily as needed.  May use Nasacort 1-2 sprays daily as needed.  May use Zaditor eye drops as needed.   Discussed starting allergy immunotherapy but patient only going to be in area for another year or so.

## 2018-11-28 NOTE — Assessment & Plan Note (Addendum)
Diagnosed with asthma over 19 years ago. Currently on Breo 200 1 puff QD and albuterol as needed but still waking up every night due to SOB for the past 1 year requiring albuterol. Patient moved in with her parents who have cats and dogs at home during this time.  Today's spirometry showed: normal pattern and no improvement post bronchodilator treatment but patient also used albuterol 1 hour prior to visit.  She is most likely having issues due to the pet dander exposure at home. Discussed environmental control measures.  If below regimen does not control symptoms will get some bloodwork to see if she meets criteria for any of the biologics for asthma treatment.  . Daily controller medication(s): Breo 200 1 puff once a day and rinse mouth afterwards + Singulair 10mg  daily o ADD on Spiriva 1.57mcg 2 puffs once a day. Sample given and demonstrated proper use.  . Prior to physical activity: May use albuterol rescue inhaler 2 puffs 5 to 15 minutes prior to strenuous physical activities. Marland Kitchen Rescue medications: May use albuterol rescue inhaler 2 puffs or nebulizer every 4 to 6 hours as needed for shortness of breath, chest tightness, coughing, and wheezing. Monitor frequency of use.

## 2018-11-28 NOTE — Assessment & Plan Note (Signed)
Flares on the hands.  Discussed proper skin care measures.  May use Eucrisa twice a day as needed. Sample given.

## 2018-11-28 NOTE — Assessment & Plan Note (Signed)
Reactions to amoxicillin, cefuroxime, hydrocodone, levofloxacin, morphine, oxycodone, and propoxyphene in the past.   Continue to avoid all these medications.  

## 2018-12-24 ENCOUNTER — Ambulatory Visit: Payer: BLUE CROSS/BLUE SHIELD | Admitting: Allergy

## 2018-12-31 ENCOUNTER — Encounter: Payer: Self-pay | Admitting: Allergy

## 2018-12-31 ENCOUNTER — Ambulatory Visit: Payer: BLUE CROSS/BLUE SHIELD | Admitting: Allergy

## 2018-12-31 VITALS — BP 122/84 | HR 84 | Resp 16 | Ht 64.0 in | Wt 181.8 lb

## 2018-12-31 DIAGNOSIS — Z889 Allergy status to unspecified drugs, medicaments and biological substances status: Secondary | ICD-10-CM

## 2018-12-31 DIAGNOSIS — J4540 Moderate persistent asthma, uncomplicated: Secondary | ICD-10-CM

## 2018-12-31 DIAGNOSIS — J302 Other seasonal allergic rhinitis: Secondary | ICD-10-CM | POA: Diagnosis not present

## 2018-12-31 DIAGNOSIS — L2089 Other atopic dermatitis: Secondary | ICD-10-CM

## 2018-12-31 DIAGNOSIS — H1010 Acute atopic conjunctivitis, unspecified eye: Secondary | ICD-10-CM | POA: Insufficient documentation

## 2018-12-31 DIAGNOSIS — J3089 Other allergic rhinitis: Secondary | ICD-10-CM

## 2018-12-31 MED ORDER — EPINEPHRINE 0.3 MG/0.3ML IJ SOAJ: 0.3000 mg | Freq: Once | INTRAMUSCULAR | 2 refills | Status: AC

## 2018-12-31 NOTE — Assessment & Plan Note (Signed)
Reactions to amoxicillin, cefuroxime, hydrocodone, levofloxacin, morphine, oxycodone, and propoxyphene in the past.   Continue to avoid all these medications.  

## 2018-12-31 NOTE — Assessment & Plan Note (Signed)
Past history - Perennial rhinoconjunctivitis symptoms for the past 20 years with worsening in the spring and fall.  Patient has tried Zyrtec, Singulair, Nasacort, saline nasal spray and Zaditor eyedrops with some benefit.  Patient used to be on allergy immunotherapy for 10 years with good benefit. 2019 skin testing showed, positive to dust mites, cat, dog, grass and horse. Interim history - Symptoms still flaring especially in the mornings despite below regimen. She has 2 cats which are currently staying mainly in her bedroom. Has HEPA filter in her room.   Start allergy immunotherapy. Consent form was signed.   May use over the counter antihistamines such as Zyrtec (cetirizine), Claritin (loratadine), Allegra (fexofenadine), or Xyzal (levocetirizine) daily as needed.  May use Nasacort 1-2 sprays daily as needed.  May use Zaditor eye drops as needed.

## 2018-12-31 NOTE — Assessment & Plan Note (Signed)
Past history - Diagnosed with asthma over 19 years ago. Currently on Breo 200 1 puff QD and albuterol as needed but still waking up every night due to SOB for the past 1 year requiring albuterol. Patient moved in with her parents who have cats and dogs at home during this time. Interim history - Much better with the addition of Spiriva. Usually flares now if really humid outdoors.  Today's spirometry showed normal spirometry.  Daily controller medication(s):Breo 200 1 puff once a day and rinse mouth afterwards + Singulair 10mg  daily ? Continue Spiriva 1.34mcg 2 puffs once a day.  Prior to physical activity:May use albuterol rescue inhaler 2 puffs 5 to 15 minutes prior to strenuous physical activities.  Rescue medications:May use albuterol rescue inhaler 2 puffs or nebulizer every 4 to 6 hours as needed for shortness of breath, chest tightness, coughing, and wheezing. Monitor frequency of use.   If above regimen does not control symptoms then will discuss adding biologics.

## 2018-12-31 NOTE — Progress Notes (Signed)
Follow Up Note  RE: Erin Collins MRN: [id number] DOB: [birth year] Date of Office Visit: 12/31/2018  Referring provider: Tarri Fuller* Primary care provider: Valinda Party, DO  Chief Complaint: Asthma  History of Present Illness: I had the pleasure of seeing Erin Collins for a follow up visit at the [facility] [place] on 12/31/2018. She is a 43 y.o. female, who is being followed for asthma, allergic rhinoconjunctivitis, atopic dermatitis and multiple drug allergies. Today she is here for regular follow up visit. Her previous allergy office visit was on 11/28/2018 with Dr. Maudie Mercury.   Asthma Currently on Breo 200 1 puff QD and Spiriva 1.76mcg 2 puffs daily and noticed with significant improvement with the addition of Spiriva. Using albuterol about only with humidity for chest tightness and SOB with good benefit.  Other allergic rhinitis Usually bothers her during the mornings. She does have 2 cats at home now and they mainly stay in her bedroom. She has a hepa filter in her bedroom.  Currently on Nasacort 2 sprays daily, zyrtec 10mg  daily, Singulair 10mg  daily and eye drops as needed but still having daily symptoms and interested in starting allergy injections.  Other atopic dermatitis Flares on the hands and Eucrisa did not work that well. Better with moisturizer though.   Multiple drug allergies No reactions since the last visit.   Assessment and Plan: Erin Collins is a 43 y.o. female with: Moderate persistent asthma Past history - Diagnosed with asthma over 19 years ago. Currently on Breo 200 1 puff QD and albuterol as needed but still waking up every night due to SOB for the past 1 year requiring albuterol. Patient moved in with her parents who have cats and dogs at home during this time. Interim history - Much better with the addition of Spiriva. Usually flares now if really humid outdoors.  Today's spirometry showed normal spirometry.  Daily controller  medication(s):Breo 200 1 puff once a day and rinse mouth afterwards + Singulair 10mg  daily ? Continue Spiriva 1.12mcg 2 puffs once a day.  Prior to physical activity:May use albuterol rescue inhaler 2 puffs 5 to 15 minutes prior to strenuous physical activities.  Rescue medications:May use albuterol rescue inhaler 2 puffs or nebulizer every 4 to 6 hours as needed for shortness of breath, chest tightness, coughing, and wheezing. Monitor frequency of use.   If above regimen does not control symptoms then will discuss adding biologics.  Seasonal and perennial allergic rhinoconjunctivitis Past history - Perennial rhinoconjunctivitis symptoms for the past 20 years with worsening in the spring and fall.  Patient has tried Zyrtec, Singulair, Nasacort, saline nasal spray and Zaditor eyedrops with some benefit.  Patient used to be on allergy immunotherapy for 10 years with good benefit. 2019 skin testing showed, positive to dust mites, cat, dog, grass and horse. Interim history - Symptoms still flaring especially in the mornings despite below regimen. She has 2 cats which are currently staying mainly in her bedroom. Has HEPA filter in her room.   Start allergy immunotherapy. Consent form was signed.   May use over the counter antihistamines such as Zyrtec (cetirizine), Claritin (loratadine), Allegra (fexofenadine), or Xyzal (levocetirizine) daily as needed.  May use Nasacort 1-2 sprays daily as needed.  May use Zaditor eye drops as needed.   Other atopic dermatitis Improved with skin hydration. Eucrisa did not help.  Continue proper skin care measures.  If it flares will send in topical steroid cream.  Multiple drug allergies Reactions to amoxicillin,  cefuroxime, hydrocodone, levofloxacin, morphine, oxycodone, and propoxyphene in the past.   Continue to avoid all these medications.   Return in about 3 months (around 04/01/2019).  Meds ordered this encounter  Medications  . EPINEPHrine  (AUVI-Q) 0.3 mg/0.3 mL IJ SOAJ injection    Sig: Inject 0.3 mLs (0.3 mg total) into the muscle once for 1 dose. As directed for life-threatening allergic reactions    Dispense:  4 Device    Refill:  2    [phone]   Diagnostics: Spirometry:  Tracings reviewed. Her effort: Good reproducible efforts. FVC: 3.17L FEV1: 2.54L, 84% predicted FEV1/FVC ratio: 80% Interpretation: Spirometry consistent with normal pattern.  Please see scanned spirometry results for details.  Medication List:  Current Outpatient Medications  Medication Sig Dispense Refill  . albuterol (ACCUNEB) 0.63 MG/3ML nebulizer solution Take 6 mLs (1.26 mg total) by nebulization every 4 (four) hours as needed for wheezing or shortness of breath. 75 mL 0  . albuterol (PROVENTIL HFA;VENTOLIN HFA) 108 (90 Base) MCG/ACT inhaler Inhale 1-2 puffs into the lungs every 6 (six) hours as needed for wheezing or shortness of breath. IM program, Ventolin if ok through 340B 1 Inhaler 10  . cetirizine (ZYRTEC) 10 MG tablet Take 10 mg by mouth daily.    . fluticasone furoate-vilanterol (BREO ELLIPTA) 200-25 MCG/INH AEPB Inhale 1 puff into the lungs daily. 60 each 11  . Guaifenesin 1200 MG TB12 Take 1 tablet (1,200 mg total) by mouth 2 (two) times daily. 20 each 0  . montelukast (SINGULAIR) 10 MG tablet Take 1 tablet (10 mg total) by mouth at bedtime. IM program 30 tablet 5  . Tiotropium Bromide Monohydrate (SPIRIVA RESPIMAT) 1.25 MCG/ACT AERS Inhale 2 puffs into the lungs daily. 1 Inhaler 5  . EPINEPHrine (AUVI-Q) 0.3 mg/0.3 mL IJ SOAJ injection Inject 0.3 mLs (0.3 mg total) into the muscle once for 1 dose. As directed for life-threatening allergic reactions 4 Device 2   No current facility-administered medications for this visit.    Allergies: Allergies  Allergen Reactions  . Amoxicillin Hives  . Cefuroxime Axetil Hives  . Hydrocodone Itching  . Levofloxacin Other (See Comments)    tendonitis  . Morphine Itching  .  Oxycodone-Acetaminophen Itching  . Penicillins Hives    Has patient had a PCN reaction causing immediate rash, facial/tongue/throat swelling, SOB or lightheadedness with hypotension: Yes Has patient had a PCN reaction causing severe rash involving mucus membranes or skin necrosis: No Has patient had a PCN reaction that required hospitalization: No Has patient had a PCN reaction occurring within the last 10 years: No If all of the above answers are "NO", then may proceed with Cephalosporin use.   Marland Kitchen Propoxyphene Itching   I reviewed her past medical history, social history, family history, and environmental history and no significant changes have been reported from previous visit on 11/28/2018.  Review of Systems  Constitutional: Negative for appetite change, chills, fever and unexpected weight change.  HENT: Negative for congestion and rhinorrhea.   Eyes: Negative for itching.  Respiratory: Positive for cough. Negative for chest tightness, shortness of breath and wheezing.   Cardiovascular: Negative for chest pain.  Gastrointestinal: Negative for abdominal pain.  Genitourinary: Negative for difficulty urinating.  Skin: Positive for rash.  Allergic/Immunologic: Positive for environmental allergies. Negative for food allergies.  Neurological: Negative for headaches.   Objective: BP 122/84   Pulse 84   Resp 16   Ht 5\' 4"  (1.626 m)   Wt 181 lb 12.8 oz (82.5 kg)  SpO2 96%   BMI 31.21 kg/m  Body mass index is 31.21 kg/m. Physical Exam  Constitutional: She is oriented to person, place, and time. She appears well-developed and well-nourished.  HENT:  Head: Normocephalic and atraumatic.  Right Ear: External ear normal.  Left Ear: External ear normal.  Nose: Nose normal.  Mouth/Throat: Oropharynx is clear and moist.  Eyes: Conjunctivae and EOM are normal.  Neck: Neck supple.  Cardiovascular: Normal rate, regular rhythm and normal heart sounds. Exam reveals no gallop and no friction  rub.  No murmur heard. Pulmonary/Chest: Effort normal and breath sounds normal. She has no wheezes. She has no rales.  Abdominal: Soft.  Lymphadenopathy:    She has no cervical adenopathy.  Neurological: She is alert and oriented to person, place, and time.  Skin: Skin is warm and dry. No rash noted.  Dry patch on dorsal surface on hands b/l  Psychiatric: She has a normal mood and affect. Her behavior is normal.  Nursing note and vitals reviewed.  Previous notes and tests were reviewed. The plan was reviewed with the patient/family, and all questions/concerned were addressed.  It was my pleasure to see Erin Collins today and participate in her care. Please feel free to contact me with any questions or concerns.  Sincerely,  Rexene Alberts, DO Allergy & Immunology  [facility] office: [phone] [facility] office: [phone]

## 2018-12-31 NOTE — Patient Instructions (Addendum)
Moderate persistent asthma  Daily controller medication(s):Breo 200 1 puff once a day and rinse mouth afterwards + Singulair 10mg  daily ? Continue Spiriva 1.67mcg 2 puffs once a day.  Prior to physical activity:May use albuterol rescue inhaler 2 puffs 5 to 15 minutes prior to strenuous physical activities.  Rescue medications:May use albuterol rescue inhaler 2 puffs or nebulizer every 4 to 6 hours as needed for shortness of breath, chest tightness, coughing, and wheezing. Monitor frequency of use.  Asthma control goals:  Full participation in all desired activities (may need albuterol before activity) Albuterol use two times or less a week on average (not counting use with activity) Cough interfering with sleep two times or less a month Oral steroids no more than once a year No hospitalizations  Other allergic rhinitis  Start allergy injections.  2019 skin testing showed, positive to dust mites, cat, dog, grass and horse.  Continue environmental control measures.  May use over the counter antihistamines such as Zyrtec (cetirizine), Claritin (loratadine), Allegra (fexofenadine), or Xyzal (levocetirizine) daily as needed.  May use Nasacort 1-2 sprays daily as needed.  May use Zaditor eye drops as needed.   Other atopic dermatitis  Continue proper skin care measures.  If it flares let me know and will send in topical steroid cream   Multiple drug allergies  Reactions to amoxicillin, cefuroxime, hydrocodone, levofloxacin, morphine, oxycodone, and propoxyphene in the past. Continue to avoid all these medications.   Follow up in 3 months

## 2018-12-31 NOTE — Assessment & Plan Note (Signed)
Improved with skin hydration. Eucrisa did not help.  Continue proper skin care measures.  If it flares will send in topical steroid cream.

## 2019-02-04 ENCOUNTER — Telehealth: Payer: Self-pay | Admitting: *Deleted

## 2019-02-04 NOTE — Telephone Encounter (Signed)
appt tues 2/18 at [facility]

## 2019-02-04 NOTE — Telephone Encounter (Signed)
Post exposure prophylaxis is not routinely indicated and if used should be started before 72 hrs. I agree with contacting her employer as she will need tested for Hep B, Hep C, and HIV. If her employer doesn't come through, sch her an [place] appt
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
## 2019-02-05 ENCOUNTER — Ambulatory Visit: Payer: BLUE CROSS/BLUE SHIELD | Admitting: Internal Medicine

## 2019-02-05 ENCOUNTER — Encounter: Payer: Self-pay | Admitting: Internal Medicine

## 2019-02-05 ENCOUNTER — Other Ambulatory Visit: Payer: Self-pay

## 2019-02-05 VITALS — BP 124/67 | HR 81 | Temp 99.1°F | Ht 64.0 in | Wt 185.5 lb

## 2019-02-05 DIAGNOSIS — Z23 Encounter for immunization: Secondary | ICD-10-CM | POA: Diagnosis not present

## 2019-02-05 DIAGNOSIS — Z7721 Contact with and (suspected) exposure to potentially hazardous body fluids: Secondary | ICD-10-CM | POA: Diagnosis not present

## 2019-02-05 NOTE — Progress Notes (Signed)
   CC: Exposure to blood   HPI:  Erin Collins is a 43 y.o. year-old female with PMH listed below who presents to clinic for exposure to blood. Please see problem based assessment and plan for further details.   Past Medical History:  Diagnosis Date  . Anemia   . Angio-edema   . Asthma   . Basal cell carcinoma ~ 1999   collarbone  . Blood clot in vein ~ 2008   "left breast"   . Complication of anesthesia    "I have trouble waking up afterwards" (12/18/2017)  . Eczema   . Family history of adverse reaction to anesthesia    "mom has trouble waking up afterwards" (12/18/2017)  . Fluttering heart    "comes and goes" (12/18/2017)  . GERD (gastroesophageal reflux disease)   . Headache    "weekly" (12/18/2017)  . History of kidney stones   . Recurrent upper respiratory infection (URI)   . 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 Torticollis   . Vertigo    Review of Systems:   Review of Systems  Constitutional: Negative for chills, fever and malaise/fatigue.  Respiratory: Negative for shortness of breath.   Cardiovascular: Negative for chest pain.  Neurological: Negative for dizziness and headaches.    Physical Exam: Vitals:   02/05/19 0850  BP: 124/67  Pulse: 81  Temp: 99.1 F (37.3 C)  TempSrc: Oral  SpO2: 98%  Weight: 185 lb 8 oz (84.1 kg)  Height: 5\' 4"  (1.626 m)    General: Well-appearing female in no acute distress Cardiac: regular rate and rhythm, nl S1/S2, no murmurs, rubs or gallops  Pulm: CTAB, no wheezes or crackles, no increased work of breathing on room air  Ext: warm and well perfused, no peripheral edema   Assessment & Plan:   See Encounters Tab for problem based charting.  Patient discussed with Dr. Eppie Gibson
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
## 2019-02-05 NOTE — Patient Instructions (Signed)
Erin Collins,   We ordered several blood tests today including HIV, hepatitis B, and hepatitis C.  We will give you a call with the results as soon as they are available.  For HIV testing you will need repeat testing in 6 weeks and in 12 weeks.  For hepatitis C you will need repeat testing in 24 weeks.  For hepatitis B testing will depend on whether you have immunity to this virus or not from previous vaccination.  The blood test we are getting today will tell us this so we will have more information about this once we receive the results from today.  Call us if you have any questions or concerns.  -Dr. Frederico Hamman

## 2019-02-05 NOTE — Progress Notes (Signed)
Case discussed with Dr. Santos-Sanchez at the time of the visit.  We reviewed the resident's history and exam and pertinent patient test results.  I agree with the assessment, diagnosis and plan of care documented in the resident's note. 

## 2019-02-06 LAB — HEPATITIS B SURFACE ANTIGEN: Hepatitis B Surface Ag: NEGATIVE

## 2019-02-06 LAB — HIV ANTIBODY (ROUTINE TESTING W REFLEX): HIV SCREEN 4TH GENERATION: NONREACTIVE

## 2019-02-06 LAB — HEPATITIS B SURFACE ANTIBODY,QUALITATIVE: HEP B SURFACE AB, QUAL: REACTIVE

## 2019-02-06 LAB — HEPATITIS B CORE ANTIBODY, TOTAL: Hep B Core Total Ab: NEGATIVE

## 2019-02-06 LAB — HEPATITIS C ANTIBODY

## 2019-03-07 ENCOUNTER — Telehealth: Payer: Self-pay | Admitting: Dietician

## 2019-03-07 NOTE — Telephone Encounter (Signed)
Clarified generic MyChart message that went out to all patients.

## 2019-03-19 ENCOUNTER — Other Ambulatory Visit: Payer: BLUE CROSS/BLUE SHIELD

## 2019-03-29 ENCOUNTER — Encounter: Payer: Self-pay | Admitting: Internal Medicine

## 2019-04-01 ENCOUNTER — Ambulatory Visit (INDEPENDENT_AMBULATORY_CARE_PROVIDER_SITE_OTHER): Payer: BLUE CROSS/BLUE SHIELD | Admitting: Internal Medicine

## 2019-04-01 ENCOUNTER — Encounter: Payer: Self-pay | Admitting: Internal Medicine

## 2019-04-01 ENCOUNTER — Other Ambulatory Visit: Payer: Self-pay

## 2019-04-01 ENCOUNTER — Ambulatory Visit: Payer: BLUE CROSS/BLUE SHIELD | Admitting: Allergy

## 2019-04-01 DIAGNOSIS — R197 Diarrhea, unspecified: Secondary | ICD-10-CM

## 2019-04-01 HISTORY — DX: Diarrhea, unspecified: R19.7

## 2019-04-01 NOTE — Progress Notes (Signed)
   [facility] Telephone Encounter  Reason for call:   This telephone encounter was created for Ms. Erin Collins on 04/01/2019 for the following purpose/cc diarrhea   Pertinent Data:   Hx of asthma, allergies, IBS ROS: Pulmonary: pt denies increased work of breathing, shortness of breath,  Cardiac: pt denies palpitations, chest pain,   Abdominal: pt denies abdominal pain or nausea/vomiting, but does endorse diarrhea   Assessment / Plan / Recommendations:   Diarrhea: In the last 3 months she has had bloating, gas, diarrhea, loose stools, mucous.  Diagnosed with IBS years ago 2012-2013.  Colonoscopy and endoscopy negative for organic disease.  Has never taken anything for it in the past.  Takes Immodium PRN but hasn't in some time.  Not painful, is associated with urgency.  No blood seen, no weight loss.  She took a picture of her stool which is in the chart, I discussed this looked like mucous mixed with stool.  She says after she flushed the mucous came off and a segmented flat worm appeared in the toilet and was flushed down.  She states she has the symptoms with everything she eats.  Purplish red rash near belly button she has also noticed.    P: she is very concerned and believes she does have a tape worm and is also worried about this new rash.    She will come in tomorrow to evaluate the rash and get stool studies taken.  As always, pt is advised that if symptoms worsen or new symptoms arise, they should go to an urgent care facility or to to ER for further evaluation.   Consent and Medical Decision Making:   Patient discussed with Dr. Beryle Beams  This is a telephone encounter between [name] and Vickki Muff on 04/01/2019 for . The visit was conducted with the patient located at home and Vickki Muff at [facility]. The patient's identity was confirmed using their DOB and current address. The patient has consented to being evaluated through a telephone  encounter and understands the associated risks (an examination cannot be done and the patient may need to come in for an appointment) / benefits (allows the patient to remain at home, decreasing exposure to coronavirus). I personally spent 16 minutes on medical discussion.

## 2019-04-01 NOTE — Assessment & Plan Note (Signed)
   Diarrhea: In the last 3 months she has had bloating, gas, diarrhea, loose stools, mucous.  Diagnosed with IBS years ago 2012-2013.  Colonoscopy and endoscopy negative for organic disease.  Has never taken anything for it in the past.  Takes Immodium PRN but hasn't in some time.  Not painful, is associated with urgency.  No blood seen, no weight loss.  She took a picture of her stool which is in the chart, I discussed this looked like mucous mixed with stool.  She says after she flushed the mucous came off and a segmented flat worm appeared in the toilet and was flushed down.  She states she has the symptoms with everything she eats.  Purplish red rash near belly button she has also noticed.    P: she is very concerned and believes she does have a tape worm and is also worried about this new rash.    She will come in tomorrow to evaluate the rash and get stool studies taken.

## 2019-04-01 NOTE — Telephone Encounter (Signed)
-----   Message from Valinda Party, [place] sent at 04/01/2019 12:08 PM EDT ----- Regarding: needs acc appointment Ms. Matlack has messaged me concerning her GI issues.  Please place Ms. Alamo in the [facility] for an in person appointment and let her know of her appointment.  Thank you,  Dr. Heber Falcon

## 2019-04-02 ENCOUNTER — Other Ambulatory Visit: Payer: Self-pay

## 2019-04-02 ENCOUNTER — Ambulatory Visit: Payer: BLUE CROSS/BLUE SHIELD | Admitting: Internal Medicine

## 2019-04-02 VITALS — BP 129/74 | HR 84 | Temp 98.3°F | Ht 64.0 in | Wt 187.8 lb

## 2019-04-02 DIAGNOSIS — R21 Rash and other nonspecific skin eruption: Secondary | ICD-10-CM | POA: Diagnosis not present

## 2019-04-02 DIAGNOSIS — Z7721 Contact with and (suspected) exposure to potentially hazardous body fluids: Secondary | ICD-10-CM | POA: Diagnosis not present

## 2019-04-02 DIAGNOSIS — K580 Irritable bowel syndrome with diarrhea: Secondary | ICD-10-CM | POA: Diagnosis not present

## 2019-04-02 DIAGNOSIS — R197 Diarrhea, unspecified: Secondary | ICD-10-CM | POA: Diagnosis not present

## 2019-04-02 NOTE — Progress Notes (Signed)
Medicine attending: Medical history, presenting problems, physical findings, and medications, reviewed with resident physician Dr Ina Homes on the day of the patient visit and I concur with his evaluation and management plan. Pt w irritable bowel syndrome; had Tele visit yesterday; advised to come in to eval abdominal bloating, non bloody stools mixed w mucus; pt concerned she saw a tapeworm. Abdominal exam benign. Given container for stool collection.

## 2019-04-02 NOTE — Assessment & Plan Note (Addendum)
HPI: Patient resented with complaints of diarrhea for the past three months. She describes increase bloating, urgency, and abdominal pain followed by loose stools and relief of the pain. She describes the stool as having a lot of mucus. Two days ago she thought that she passed a tapeworm in her stools has been very concerned about this since that time. She is not tried anything for her loose bowel movements but states she has been told she has IBS. She does not notice any correlation with food and states that it can happen during all times the day. She has up to six bowel movements per day. She is never had stool studies. She was diagnosed with IBS in [place] after EGD and colonoscopy.  She has not traveled anywhere in the last three months but did have exposure to someone else's blood approximately 2 to 3 months ago. She hates a traditional Western diet that consists of a lot of fast food. She states that she does not eat a lot of red meat or pork.  A/P: - Check stool studies (osm, calprotectin, fecal elastase, ova and parasite) - Check strongyloides antibodies

## 2019-04-02 NOTE — Addendum Note (Signed)
Addended by: Orson Gear on: 04/02/2019 02:19 PM   Modules accepted: Orders

## 2019-04-02 NOTE — Patient Instructions (Signed)
Thank you for allowing [place] to provide your care. I have ordered some stool studies. After you've collected your stool you will need to return to the clinic.

## 2019-04-02 NOTE — Progress Notes (Signed)
Medicine attending: Medical history, presenting problems, and medications, reviewed with resident physician Dr Guadlupe Spanish on the day of the patient telephone consultation and I concur with his evaluation and management plan. 43 y/o woman w chronic irritable bowel syndrome now w increased bloating, non bloody stools mixed with mucus.No relation to meals. Not losing weight. She thinks she saw a tapeworm. Advised to come in to leave stool sample for analysis.

## 2019-04-02 NOTE — Progress Notes (Signed)
   CC: Diarrhea and rash  HPI:  [name] is a 43 y.o. female with PMHx listed below presenting for diarrhea and rash. Please see the A&P for the status of the patient's chronic medical problems.  Past Medical History:  Diagnosis Date  . Anemia   . Angio-edema   . Asthma   . Basal cell carcinoma ~ 1999   collarbone  . Blood clot in vein ~ 2008   "left breast"   . Complication of anesthesia    "I have trouble waking up afterwards" (12/18/2017)  . Eczema   . Family history of adverse reaction to anesthesia    "mom has trouble waking up afterwards" (12/18/2017)  . Fluttering heart    "comes and goes" (12/18/2017)  . GERD (gastroesophageal reflux disease)   . Headache    "weekly" (12/18/2017)  . History of kidney stones   . Recurrent upper respiratory infection (URI)   . 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 Torticollis   . Vertigo    Review of Systems:  Performed and all others negative.  Physical Exam: Vitals:   04/02/19 0955  BP: 129/74  Pulse: 84  Temp: 98.3 F (36.8 C)  TempSrc: Oral  SpO2: 97%  Weight: 187 lb 12.8 oz (85.2 kg)   General: Well nourished female in no acute distress Pulm: Good air movement with no wheezing or crackles  CV: RRR, no murmurs, no rubs  Abdomen: Active bowel sounds, soft, non-distended, no tenderness to palpation   Assessment & Plan:   See Encounters Tab for problem based charting.  Patient discussed with Dr. Beryle Beams

## 2019-04-03 ENCOUNTER — Telehealth: Payer: Self-pay | Admitting: *Deleted

## 2019-04-03 NOTE — Telephone Encounter (Signed)
Pt called / informed no contact with mother/stepfather x 2 weeks per Dr Lynnae January. And if develop any symptoms - fever, shortness of breath, CP,cough - to call the office. Verbalized understanding.

## 2019-04-03 NOTE — Telephone Encounter (Signed)
Called pt - no answer; left message to call the office . 
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
## 2019-04-03 NOTE — Telephone Encounter (Signed)
Call from pt - stated her mother has been tested positive for COVID-19; results came back this morning. Stated last contact w/her mother has been about 1 month ago. But she last had contact w/her step-father on Saturday who lives w/her mother. She denies fever, shortness of breath, chest pains. Also she's concerns since she lives w/2 elderly grandparents. Advised good handwashing, mask while near her grandparents. She wants to know if she needs to be concern and what else to do?

## 2019-04-04 LAB — OVA AND PARASITE EXAMINATION

## 2019-04-04 LAB — STRONGYLOIDES, AB, IGG: Strongyloides, Ab, IgG: NEGATIVE

## 2019-04-04 LAB — HIV ANTIBODY (ROUTINE TESTING W REFLEX): HIV Screen 4th Generation wRfx: NONREACTIVE

## 2019-04-04 LAB — CLOSTRIDIUM DIFFICILE EIA: C difficile Toxins A+B, EIA: NEGATIVE

## 2019-04-08 LAB — SPECIMEN STATUS REPORT

## 2019-04-09 ENCOUNTER — Telehealth: Payer: Self-pay | Admitting: Internal Medicine

## 2019-04-09 NOTE — Telephone Encounter (Signed)
Patient notified of stool studies. 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

## 2019-04-10 LAB — CALPROTECTIN, FECAL: Calprotectin, Fecal: 23 ug/g (ref 0–120)

## 2019-04-10 LAB — OSMOLALITY, STOOL

## 2019-04-10 LAB — PANCREATIC ELASTASE, FECAL: Pancreatic Elastase, Fecal: 458 ug Elast./g (ref >200)

## 2019-04-30 ENCOUNTER — Other Ambulatory Visit: Payer: BLUE CROSS/BLUE SHIELD

## 2019-04-30 ENCOUNTER — Other Ambulatory Visit: Payer: Self-pay | Admitting: Internal Medicine

## 2019-04-30 DIAGNOSIS — Z7721 Contact with and (suspected) exposure to potentially hazardous body fluids: Secondary | ICD-10-CM

## 2019-05-16 ENCOUNTER — Ambulatory Visit: Payer: BLUE CROSS/BLUE SHIELD | Admitting: Allergy

## 2019-05-17 ENCOUNTER — Other Ambulatory Visit: Payer: BLUE CROSS/BLUE SHIELD

## 2019-05-23 ENCOUNTER — Encounter: Payer: Self-pay | Admitting: Allergy

## 2019-05-23 ENCOUNTER — Other Ambulatory Visit: Payer: Self-pay

## 2019-05-23 ENCOUNTER — Ambulatory Visit (INDEPENDENT_AMBULATORY_CARE_PROVIDER_SITE_OTHER): Payer: BC Managed Care – PPO | Admitting: Allergy

## 2019-05-23 DIAGNOSIS — H1010 Acute atopic conjunctivitis, unspecified eye: Secondary | ICD-10-CM

## 2019-05-23 DIAGNOSIS — L2089 Other atopic dermatitis: Secondary | ICD-10-CM

## 2019-05-23 DIAGNOSIS — J4540 Moderate persistent asthma, uncomplicated: Secondary | ICD-10-CM | POA: Diagnosis not present

## 2019-05-23 DIAGNOSIS — J302 Other seasonal allergic rhinitis: Secondary | ICD-10-CM

## 2019-05-23 DIAGNOSIS — Z889 Allergy status to unspecified drugs, medicaments and biological substances status: Secondary | ICD-10-CM

## 2019-05-23 DIAGNOSIS — J3089 Other allergic rhinitis: Secondary | ICD-10-CM

## 2019-05-23 MED ORDER — AZELASTINE HCL 0.1 % NA SOLN: NASAL | 5 refills | Status: DC

## 2019-05-23 MED ORDER — EPINEPHRINE 0.3 MG/0.3ML IJ SOAJ: 0.3000 mg | Freq: Once | INTRAMUSCULAR | 2 refills | Status: AC

## 2019-05-23 MED ORDER — MONTELUKAST SODIUM 10 MG PO TABS: 10.0000 mg | ORAL_TABLET | Freq: Every day | ORAL | 5 refills | Status: DC

## 2019-05-23 NOTE — Assessment & Plan Note (Signed)
Past history - Eucrisa not effective.  Interim history - some flares with frequent hand washing.   Continue proper skin care measures.

## 2019-05-23 NOTE — Progress Notes (Signed)
RE: Erin Collins MRN: [id number] DOB: 1976-08-13 Date of Telemedicine Visit: 05/23/2019  Referring provider: Tarri Fuller* Primary care provider: Valinda Party, DO  Chief Complaint: Eczema (on hands, has been doing lots of hand washing, was very irritated but now better ); Asthma (bothers her when it is humid outside); and Allergic Rhinitis  (has been having issues since the pollen has been high)   Telemedicine Follow Up Visit via Telephone: I connected with Tiearra Colwell for a follow up on 05/23/19 by telephone and verified that I am speaking with the correct person using two identifiers.   I discussed the limitations, risks, security and privacy concerns of performing an evaluation and management service by telephone and the availability of in person appointments. I also discussed with the patient that there may be a patient responsible charge related to this service. The patient expressed understanding and agreed to proceed.  Patient is at home. Provider is at the office.  Visit start time: 9:24 AM Visit end time: 9:52 AM Insurance consent/check in by: front desk Medical consent and medical assistant/nurse: Lisabeth Pick S.  History of Present Illness: She is a 43 y.o. female, who is being followed for asthma, allergic rhino conjunctivitis, atopic dermatitis, multiple drug allergies. Her previous allergy office visit was on 12/31/2018 with Dr. Maudie Mercury. Today is a regular follow up visit.  Moderate persistent asthma Currently on Breo 200 1 puff daily, Singulair 10mg  daily, Spiriva 1.90mcg 2 puffs once a day.  Gets chest tightness if forgets to take medications even for 1 day. Using albuterol when outdoors with good benefit. Lot of PND and clearing of her throat especially in the mornings.  No ER/urgent care visits or prednisone use since the last visit.   Seasonal and perennial allergic rhinoconjunctivitis Flaring with the pollen season. Taking zyrtec 10mg  daily, Zaditor eye  drops and using Nasacort as needed.  Other atopic dermatitis Improved now since stopped washing hands too much. Using Eucerin daily with good benefit.  Multiple drug allergies No reactions to any new medications since last visit.  Assessment and Plan: Erin Collins is a 43 y.o. female with: Moderate persistent asthma without complication Past history - Diagnosed with asthma over 19 years ago. Currently on Breo 200 1 puff QD and albuterol as needed but still waking up every night due to SOB for the past 1 year requiring albuterol. Patient moved in with her parents who have cats and dogs at home during this time. Interim history - symptoms flare when outdoors and if misses a dose of medication.   Daily controller medication(s):Continue Breo 200 1 puff once a day and rinse mouth afterwards + Singulair 10mg  daily ? Continue Spiriva 1.2mcg 2 puffs once a day.  Prior to physical activity:May use albuterol rescue inhaler 2 puffs 5 to 15 minutes prior to strenuous physical activities.  Rescue medications:May use albuterol rescue inhaler 2 puffs or nebulizer every 4 to 6 hours as needed for shortness of breath, chest tightness, coughing, and wheezing. Monitor frequency of use.  Get spirometry at next visit.  If above regimen does not control symptoms then will discuss adding biologics.  Seasonal and perennial allergic rhinoconjunctivitis Past history - Perennial rhinoconjunctivitis symptoms for the past 20 years with worsening in the spring and fall.  Patient has tried Zyrtec, Singulair, Nasacort, saline nasal spray and Zaditor eyedrops with some benefit.  Patient used to be on allergy immunotherapy for 10 years with good benefit. 2019 skin testing showed, positive to dust mites, cat, dog, grass  and horse. Interim history - Significant PND. Has not been able to start AIT due to Epipen cost.   Start allergy injections when able.  Sent in Auvi-Q prescription and let me know if the co-pay is too  high.   May use over the counter antihistamines such as Zyrtec (cetirizine), Claritin (loratadine), Allegra (fexofenadine), or Xyzal (levocetirizine) daily as needed.  Start using Nasacort 1 spray 1-2 times a day for nasal congestion.  Use Azelastine nasal spray 1-2 sprays 1-2 times a day for postnasal drip.   May use Zaditor eye drops as needed for itchy/watery eyes.   Other atopic dermatitis Past history - Eucrisa not effective.  Interim history - some flares with frequent hand washing.   Continue proper skin care measures.  Multiple drug allergies Reactions to amoxicillin, cefuroxime, hydrocodone, levofloxacin, morphine, oxycodone, and propoxyphene in the past.   Continue to avoid all these medications.   Return in about 3 months (around 08/23/2019).  Meds ordered this encounter  Medications  . montelukast (SINGULAIR) 10 MG tablet    Sig: Take 1 tablet (10 mg total) by mouth at bedtime. IM program    Dispense:  30 tablet    Refill:  5  . azelastine (ASTELIN) 0.1 % nasal spray    Sig: Take 1-2 sprays in each nostril 1-2 times a day as needed for nasal drainage.    Dispense:  30 mL    Refill:  5  . EPINEPHrine (AUVI-Q) 0.3 mg/0.3 mL IJ SOAJ injection    Sig: Inject 0.3 mLs (0.3 mg total) into the muscle once for 1 dose. As directed for life-threatening allergic reactions    Dispense:  4 Device    Refill:  2   Diagnostics: None.  Medication List:  Current Outpatient Medications  Medication Sig Dispense Refill  . albuterol (ACCUNEB) 0.63 MG/3ML nebulizer solution Take 6 mLs (1.26 mg total) by nebulization every 4 (four) hours as needed for wheezing or shortness of breath. 75 mL 0  . albuterol (PROVENTIL HFA;VENTOLIN HFA) 108 (90 Base) MCG/ACT inhaler Inhale 1-2 puffs into the lungs every 6 (six) hours as needed for wheezing or shortness of breath. IM program, Ventolin if ok through 340B 1 Inhaler 10  . cetirizine (ZYRTEC) 10 MG tablet Take 10 mg by mouth daily.    .  fluticasone furoate-vilanterol (BREO ELLIPTA) 200-25 MCG/INH AEPB Inhale 1 puff into the lungs daily. 60 each 11  . Guaifenesin 1200 MG TB12 Take 1 tablet (1,200 mg total) by mouth 2 (two) times daily. 20 each 0  . montelukast (SINGULAIR) 10 MG tablet Take 1 tablet (10 mg total) by mouth at bedtime. IM program 30 tablet 5  . Tiotropium Bromide Monohydrate (SPIRIVA RESPIMAT) 1.25 MCG/ACT AERS Inhale 2 puffs into the lungs daily. 1 Inhaler 5  . azelastine (ASTELIN) 0.1 % nasal spray Take 1-2 sprays in each nostril 1-2 times a day as needed for nasal drainage. 30 mL 5  . EPINEPHrine (AUVI-Q) 0.3 mg/0.3 mL IJ SOAJ injection Inject 0.3 mLs (0.3 mg total) into the muscle once for 1 dose. As directed for life-threatening allergic reactions 4 Device 2   No current facility-administered medications for this visit.    Allergies: Allergies  Allergen Reactions  . Amoxicillin Hives  . Cefuroxime Axetil Hives  . Hydrocodone Itching  . Levofloxacin Other (See Comments)    tendonitis  . Morphine Itching  . Oxycodone-Acetaminophen Itching  . Penicillins Hives    Has patient had a PCN reaction causing immediate rash, facial/tongue/throat  swelling, SOB or lightheadedness with hypotension: Yes Has patient had a PCN reaction causing severe rash involving mucus membranes or skin necrosis: No Has patient had a PCN reaction that required hospitalization: No Has patient had a PCN reaction occurring within the last 10 years: No If all of the above answers are "NO", then may proceed with Cephalosporin use.   Marland Kitchen Propoxyphene Itching   I reviewed her past medical history, social history, family history, and environmental history and no significant changes have been reported from previous visit on 12/31/2018.  Review of Systems  Constitutional: Negative for appetite change, chills, fever and unexpected weight change.  HENT: Positive for postnasal drip. Negative for congestion and rhinorrhea.   Eyes: Negative for  itching.  Respiratory: Positive for cough. Negative for chest tightness, shortness of breath and wheezing.   Cardiovascular: Negative for chest pain.  Gastrointestinal: Negative for abdominal pain.  Genitourinary: Negative for difficulty urinating.  Skin: Negative for rash.  Allergic/Immunologic: Positive for environmental allergies. Negative for food allergies.  Neurological: Negative for headaches.   Objective: Physical Exam Not obtained as encounter was done via telephone.   Previous notes and tests were reviewed.  I discussed the assessment and treatment plan with the patient. The patient was provided an opportunity to ask questions and all were answered. The patient agreed with the plan and demonstrated an understanding of the instructions. After visit summary/patient instructions available via mychart.   The patient was advised to call back or seek an in-person evaluation if the symptoms worsen or if the condition fails to improve as anticipated.  I provided 28 minutes of non-face-to-face time during this encounter.  It was my pleasure to participate in [place] care today. Please feel free to contact me with any questions or concerns.   Sincerely,  Rexene Alberts, DO Allergy & Immunology  [facility] office: [phone] [facility] office: [phone]

## 2019-05-23 NOTE — Assessment & Plan Note (Signed)
Past history - Diagnosed with asthma over 19 years ago. Currently on Breo 200 1 puff QD and albuterol as needed but still waking up every night due to SOB for the past 1 year requiring albuterol. Patient moved in with her parents who have cats and dogs at home during this time. Interim history - symptoms flare when outdoors and if misses a dose of medication.   Daily controller medication(s):Continue Breo 200 1 puff once a day and rinse mouth afterwards + Singulair 10mg  daily ? Continue Spiriva 1.25mcg 2 puffs once a day.  Prior to physical activity:May use albuterol rescue inhaler 2 puffs 5 to 15 minutes prior to strenuous physical activities.  Rescue medications:May use albuterol rescue inhaler 2 puffs or nebulizer every 4 to 6 hours as needed for shortness of breath, chest tightness, coughing, and wheezing. Monitor frequency of use.  Get spirometry at next visit.  If above regimen does not control symptoms then will discuss adding biologics.

## 2019-05-23 NOTE — Patient Instructions (Addendum)
Moderate persistent asthma  Daily controller medication(s):Continue Breo 200 1 puff once a day and rinse mouth afterwards + Singulair 10mg  daily ? Continue Spiriva 1.55mcg 2 puffs once a day.  Prior to physical activity:May use albuterol rescue inhaler 2 puffs 5 to 15 minutes prior to strenuous physical activities.  Rescue medications:May use albuterol rescue inhaler 2 puffs or nebulizer every 4 to 6 hours as needed for shortness of breath, chest tightness, coughing, and wheezing. Monitor frequency of use. Asthma control goals:  Full participation in all desired activities (may need albuterol before activity) Albuterol use two times or less a week on average (not counting use with activity) Cough interfering with sleep two times or less a month Oral steroids no more than once a year No hospitalizations  If above regimen does not control symptoms then will discuss adding biologics.  Seasonal and perennial allergic rhinoconjunctivitis Past history -  2019 skin testing showed, positive to dust mites, cat, dog, grass and horse.  Start allergy injections when able.  Sent in Auvi-Q prescription and let me know if the co-pay is too high. This is the same as an epinephrine injectable pen.   May use over the counter antihistamines such as Zyrtec (cetirizine), Claritin (loratadine), Allegra (fexofenadine), or Xyzal (levocetirizine) daily as needed.  Start using Nasacort 1 spray 1-2 times a day for nasal congestion.  Use Azelastine nasal spray 1-2 sprays 1-2 times a day for postnasal drip.   May use Zaditor eye drops as needed for itchy/watery eyes.   Other atopic dermatitis  Continue proper skin care.   Multiple drug allergies Reactions to amoxicillin, cefuroxime, hydrocodone, levofloxacin, morphine, oxycodone, and propoxyphene in the past.   Continue to avoid all these medications.   Follow-up in 3 months.  Reducing Pollen Exposure . Pollen seasons: trees (spring), grass  (summer) and ragweed/weeds (fall). Marland Kitchen Keep windows closed in your home and car to lower pollen exposure.  Susa Simmonds air conditioning in the bedroom and throughout the house if possible.  . Avoid going out in dry windy days - especially early morning. . Pollen counts are highest between 5 - 10 AM and on dry, hot and windy days.  . Save outside activities for late afternoon or after a heavy rain, when pollen levels are lower.  . Avoid mowing of grass if you have grass pollen allergy. Marland Kitchen Be aware that pollen can also be transported indoors on people and pets.  . Dry your clothes in an automatic dryer rather than hanging them outside where they might collect pollen.  . Rinse hair and eyes before bedtime. Control of House Dust Mite Allergen . Dust mite allergens are a common trigger of allergy and asthma symptoms. While they can be found throughout the house, these microscopic creatures thrive in warm, humid environments such as bedding, upholstered furniture and carpeting. . Because so much time is spent in the bedroom, it is essential to reduce mite levels there.  . Encase pillows, mattresses, and box springs in special allergen-proof fabric covers or airtight, zippered plastic covers.  . Bedding should be washed weekly in hot water (130 F) and dried in a hot dryer. Allergen-proof covers are available for comforters and pillows that can't be regularly washed.  Wendee Copp the allergy-proof covers every few months. Minimize clutter in the bedroom. Keep pets out of the bedroom.  Marland Kitchen Keep humidity less than 50% by using a dehumidifier or air conditioning. You can buy a humidity measuring device called a hygrometer to monitor this.  Marland Kitchen  If possible, replace carpets with hardwood, linoleum, or washable area rugs. If that's not possible, vacuum frequently with a vacuum that has a HEPA filter. . Remove all upholstered furniture and non-washable window drapes from the bedroom. . Remove all non-washable stuffed toys  from the bedroom.  Wash stuffed toys weekly. Pet Allergen Avoidance: . Contrary to popular opinion, there are no "hypoallergenic" breeds of dogs or cats. That is because people are not allergic to an animal's hair, but to an allergen found in the animal's saliva, dander (dead skin flakes) or urine. Pet allergy symptoms typically occur within minutes. For some people, symptoms can build up and become most severe 8 to 12 hours after contact with the animal. People with severe allergies can experience reactions in public places if dander has been transported on the pet owners' clothing. Marland Kitchen Keeping an animal outdoors is only a partial solution, since homes with pets in the yard still have higher concentrations of animal allergens. . Before getting a pet, ask your allergist to determine if you are allergic to animals. If your pet is already considered part of your family, try to minimize contact and keep the pet out of the bedroom and other rooms where you spend a great deal of time. . As with dust mites, vacuum carpets often or replace carpet with a hardwood floor, tile or linoleum. . High-efficiency particulate air (HEPA) cleaners can reduce allergen levels over time. . While dander and saliva are the source of cat and dog allergens, urine is the source of allergens from rabbits, hamsters, mice and [place] pigs; so ask a non-allergic family member to clean the animal's cage. . If you have a pet allergy, talk to your allergist about the potential for allergy immunotherapy (allergy shots). This strategy can often provide long-term relief.

## 2019-05-23 NOTE — Assessment & Plan Note (Signed)
Reactions to amoxicillin, cefuroxime, hydrocodone, levofloxacin, morphine, oxycodone, and propoxyphene in the past.   Continue to avoid all these medications.

## 2019-05-23 NOTE — Assessment & Plan Note (Signed)
Past history - Perennial rhinoconjunctivitis symptoms for the past 20 years with worsening in the spring and fall.  Patient has tried Zyrtec, Singulair, Nasacort, saline nasal spray and Zaditor eyedrops with some benefit.  Patient used to be on allergy immunotherapy for 10 years with good benefit. 2019 skin testing showed, positive to dust mites, cat, dog, grass and horse. Interim history - Significant PND. Has not been able to start AIT due to Epipen cost.   Start allergy injections when able.  Sent in Auvi-Q prescription and let me know if the co-pay is too high.   May use over the counter antihistamines such as Zyrtec (cetirizine), Claritin (loratadine), Allegra (fexofenadine), or Xyzal (levocetirizine) daily as needed.  Start using Nasacort 1 spray 1-2 times a day for nasal congestion.  Use Azelastine nasal spray 1-2 sprays 1-2 times a day for postnasal drip.   May use Zaditor eye drops as needed for itchy/watery eyes.

## 2019-06-17 ENCOUNTER — Encounter: Payer: Self-pay | Admitting: *Deleted

## 2019-07-23 ENCOUNTER — Other Ambulatory Visit: Payer: BLUE CROSS/BLUE SHIELD

## 2019-08-12 ENCOUNTER — Other Ambulatory Visit: Payer: BC Managed Care – PPO

## 2019-08-14 ENCOUNTER — Other Ambulatory Visit (INDEPENDENT_AMBULATORY_CARE_PROVIDER_SITE_OTHER): Payer: BC Managed Care – PPO

## 2019-08-14 DIAGNOSIS — Z7721 Contact with and (suspected) exposure to potentially hazardous body fluids: Secondary | ICD-10-CM | POA: Diagnosis not present

## 2019-08-15 LAB — HIV ANTIBODY (ROUTINE TESTING W REFLEX): HIV Screen 4th Generation wRfx: NONREACTIVE

## 2019-08-21 ENCOUNTER — Ambulatory Visit: Payer: BC Managed Care – PPO | Admitting: Allergy

## 2019-09-09 ENCOUNTER — Ambulatory Visit: Payer: BC Managed Care – PPO | Admitting: Allergy

## 2019-09-09 ENCOUNTER — Other Ambulatory Visit: Payer: Self-pay

## 2019-09-09 ENCOUNTER — Encounter: Payer: Self-pay | Admitting: Allergy

## 2019-09-09 VITALS — BP 130/78 | HR 100 | Temp 98.0°F | Resp 16

## 2019-09-09 DIAGNOSIS — J302 Other seasonal allergic rhinitis: Secondary | ICD-10-CM

## 2019-09-09 DIAGNOSIS — J4540 Moderate persistent asthma, uncomplicated: Secondary | ICD-10-CM | POA: Diagnosis not present

## 2019-09-09 DIAGNOSIS — Z889 Allergy status to unspecified drugs, medicaments and biological substances status: Secondary | ICD-10-CM | POA: Diagnosis not present

## 2019-09-09 DIAGNOSIS — L2089 Other atopic dermatitis: Secondary | ICD-10-CM

## 2019-09-09 DIAGNOSIS — H1010 Acute atopic conjunctivitis, unspecified eye: Secondary | ICD-10-CM

## 2019-09-09 DIAGNOSIS — J3089 Other allergic rhinitis: Secondary | ICD-10-CM

## 2019-09-09 MED ORDER — FAMOTIDINE 40 MG PO TABS: ORAL_TABLET | ORAL | 2 refills | Status: DC

## 2019-09-09 MED ORDER — CARBINOXAMINE MALEATE 6 MG PO TABS: 6.0000 mg | ORAL_TABLET | Freq: Two times a day (BID) | ORAL | 2 refills | Status: DC | PRN

## 2019-09-09 NOTE — Patient Instructions (Signed)
Moderate persistent asthma without complication  Daily controller medication(s):Continue Breo 200 1 puff once a day and rinse mouth afterwards + Singulair 10mg  daily ? Continue Spiriva 1.67mcg 2 puffs once a day.  Prior to physical activity:May use albuterol rescue inhaler 2 puffs 5 to 15 minutes prior to strenuous physical activities.  Rescue medications:May use albuterol rescue inhaler 2 puffs or nebulizer every 4 to 6 hours as needed for shortness of breath, chest tightness, coughing, and wheezing. Monitor frequency of use. . Asthma control goals:  . Full participation in all desired activities (may need albuterol before activity) . Albuterol use two times or less a week on average (not counting use with activity) . Cough interfering with sleep two times or less a month . Oral steroids no more than once a year . No hospitalizations  Seasonal and perennial allergic rhinoconjunctivitis  Start allergyinjections when able - call so we can put orders in.   Start Ryvent twice a day to help with drainage. This replaces zyrtec.   Continue using Nasacort 1 spray 2 times a day for nasal congestion.  Start Azelastine nasal spray 2 sprays 2 times a day for postnasal drip.   May use Zaditor eye drops as needed for itchy/watery eyes.   Start pepcid 20mg  twice a day for 1 month then stop to help with reflux/throat clearing after meals.   Other atopic dermatitis  Continue proper skin care measures.  Multiple drug allergies Reactions to amoxicillin, cefuroxime, hydrocodone, levofloxacin, morphine, oxycodone, and propoxyphene in the past.   Continue to avoid all these medications.   Follow up in 2 months

## 2019-09-09 NOTE — Assessment & Plan Note (Signed)
Past history - Perennial rhinoconjunctivitis symptoms for the past 20 years with worsening in the spring and fall.  Patient has tried Zyrtec, Singulair, Nasacort, saline nasal spray and Zaditor eyedrops with some benefit.  Patient used to be on allergy immunotherapy for 10 years with good benefit. 2019 skin testing showed, positive to dust mites, cat, dog, grass and horse. Interim history - Significant PND. Wants to start AIT. Erin Collins is $50 per pen.  Start allergyinjections when able - call so we can put orders in.   Start Ryvent twice a day to help with drainage. This replaces zyrtec.   Continue using Nasacort 1 spray 2 times a day for nasal congestion.  Start Azelastine nasal spray 2 sprays 2 times a day for postnasal drip.   May use Zaditor eye drops as needed for itchy/watery eyes.   Start pepcid 20mg  twice a day for 1 month then stop to help with reflux/throat clearing after meals.

## 2019-09-09 NOTE — Progress Notes (Signed)
Follow Up Note  RE: Erin Collins MRN: [id number] DOB: 1976/08/28 Date of Office Visit: 09/09/2019  Referring provider: Tarri Fuller* Primary care provider: Maudie Mercury, MD  Chief Complaint: Asthma (she is still waking up in the mornings with some chest tightness and having to use her rescue inhaler.she is using her spiriva and breo as prescribed. the chest tightness tends to be early in the morning and late at night right before going to bed.  )  History of Present Illness: I had the pleasure of seeing Erin Collins for a follow up visit at the [facility] on 09/09/2019. She is a 43 y.o. female, who is being followed for asthma, allergic rhino conjunctivitis, atopic dermatitis, multiple drug allergies. Today she is here for regular follow up visit.  Her previous allergy office visit was on 05/23/2019 with Dr. Maudie Mercury via telemedicine.   Moderate persistent asthma without complication Noticed some chest tightness, heavy breathing and throat clearing in the morning and at night. She also has nasal congestion with this. Using albuterol daily for the past 2 weeks with some benefit.  Currently on Breo 200 1 puff daily, Spiriva 2 puffs once a day.  Patient has some periodic reflux and takes OTC medications as needed. Noticed some increased throat clearing after meals lately.   Seasonal and perennial allergic rhinoconjunctivitis Takes Nasacort 2 sprays daily, Astelin 1 spray daily with some benefit.  Generic ceterizine daily and using Zaditor as needed.  Other atopic dermatitis  Improved with Eucerin.   Assessment and Plan: Sole is a 43 y.o. female with: Moderate persistent asthma without complication Past history - Diagnosed with asthma over 19 years ago. Currently on Breo 200 1 puff QD and albuterol as needed but still waking up every night due to SOB for the past 1 year requiring albuterol. Patient moved in with her parents who have cats and dogs at home during  this time. Interim history - doing well up until 2 weeks ago with cold weather. Having some increased symptoms and using albuterol in the mornings. Based on history, may be irritated from PND.   Today's spirometry was normal.   Daily controller medication(s):Continue Breo 200 1 puff once a day and rinse mouth afterwards + Singulair 10mg  daily ? Continue Spiriva 1.48mcg 2 puffs once a day.  Prior to physical activity:May use albuterol rescue inhaler 2 puffs 5 to 15 minutes prior to strenuous physical activities.  Rescue medications:May use albuterol rescue inhaler 2 puffs or nebulizer every 4 to 6 hours as needed for shortness of breath, chest tightness, coughing, and wheezing. Monitor frequency of use.  Seasonal and perennial allergic rhinoconjunctivitis Past history - Perennial rhinoconjunctivitis symptoms for the past 20 years with worsening in the spring and fall.  Patient has tried Zyrtec, Singulair, Nasacort, saline nasal spray and Zaditor eyedrops with some benefit.  Patient used to be on allergy immunotherapy for 10 years with good benefit. 2019 skin testing showed, positive to dust mites, cat, dog, grass and horse. Interim history - Significant PND. Wants to start AIT. [name] is $50 per pen.  Start allergyinjections when able - call so we can put orders in.   Start Ryvent twice a day to help with drainage. This replaces zyrtec.   Continue using Nasacort 1 spray 2 times a day for nasal congestion.  Start Azelastine nasal spray 2 sprays 2 times a day for postnasal drip.   May use Zaditor eye drops as needed for itchy/watery eyes.  Start pepcid 20mg  twice a day for 1 month then stop to help with reflux/throat clearing after meals.  Other atopic dermatitis Past history - Eucrisa not effective.  Interim history - improved with Eucerin.   Continue proper skin care measures.  Multiple drug allergies Reactions to amoxicillin, cefuroxime, hydrocodone, levofloxacin, morphine,  oxycodone, and propoxyphene in the past.   Continue to avoid all these medications.   Return in about 2 months (around 11/09/2019).  Meds ordered this encounter  Medications  . Carbinoxamine Maleate (RYVENT) 6 MG TABS    Sig: Take 6 mg by mouth every 12 (twelve) hours as needed.    Dispense:  60 tablet    Refill:  2  . famotidine (PEPCID) 40 MG tablet    Sig: Take 1/2 tablet twice a day.    Dispense:  30 tablet    Refill:  2   Diagnostics: Spirometry:  Tracings reviewed. Her effort: Good reproducible efforts. FVC: 3.28L FEV1: 2.51L, 84% predicted FEV1/FVC ratio: 77% Interpretation: Spirometry consistent with normal pattern.  Please see scanned spirometry results for details.  Medication List:  Current Outpatient Medications  Medication Sig Dispense Refill  . albuterol (ACCUNEB) 0.63 MG/3ML nebulizer solution Take 6 mLs (1.26 mg total) by nebulization every 4 (four) hours as needed for wheezing or shortness of breath. 75 mL 0  . albuterol (PROVENTIL HFA;VENTOLIN HFA) 108 (90 Base) MCG/ACT inhaler Inhale 1-2 puffs into the lungs every 6 (six) hours as needed for wheezing or shortness of breath. IM program, Ventolin if ok through 340B 1 Inhaler 10  . azelastine (ASTELIN) 0.1 % nasal spray Take 1-2 sprays in each nostril 1-2 times a day as needed for nasal drainage. 30 mL 5  . cetirizine (ZYRTEC) 10 MG tablet Take 10 mg by mouth daily.    . fluticasone furoate-vilanterol (BREO ELLIPTA) 200-25 MCG/INH AEPB Inhale 1 puff into the lungs daily. 60 each 11  . Guaifenesin 1200 MG TB12 Take 1 tablet (1,200 mg total) by mouth 2 (two) times daily. 20 each 0  . montelukast (SINGULAIR) 10 MG tablet Take 1 tablet (10 mg total) by mouth at bedtime. IM program 30 tablet 5  . Tiotropium Bromide Monohydrate (SPIRIVA RESPIMAT) 1.25 MCG/ACT AERS Inhale 2 puffs into the lungs daily. 1 Inhaler 5  . Carbinoxamine Maleate (RYVENT) 6 MG TABS Take 6 mg by mouth every 12 (twelve) hours as needed. 60 tablet 2   . famotidine (PEPCID) 40 MG tablet Take 1/2 tablet twice a day. 30 tablet 2   No current facility-administered medications for this visit.    Allergies: Allergies  Allergen Reactions  . Amoxicillin Hives  . Cefuroxime Axetil Hives  . Hydrocodone Itching  . Levofloxacin Other (See Comments)    tendonitis  . Morphine Itching  . Oxycodone-Acetaminophen Itching  . Penicillins Hives    Has patient had a PCN reaction causing immediate rash, facial/tongue/throat swelling, SOB or lightheadedness with hypotension: Yes Has patient had a PCN reaction causing severe rash involving mucus membranes or skin necrosis: No Has patient had a PCN reaction that required hospitalization: No Has patient had a PCN reaction occurring within the last 10 years: No If all of the above answers are "NO", then may proceed with Cephalosporin use.   Marland Kitchen Propoxyphene Itching   I reviewed her past medical history, social history, family history, and environmental history and no significant changes have been reported from previous visit on 05/23/2019.  Review of Systems  Constitutional: Negative for appetite change, chills, fever and unexpected  weight change.  HENT: Positive for postnasal drip. Negative for congestion and rhinorrhea.   Eyes: Negative for itching.  Respiratory: Positive for chest tightness and shortness of breath. Negative for cough and wheezing.   Cardiovascular: Negative for chest pain.  Gastrointestinal: Negative for abdominal pain.  Genitourinary: Negative for difficulty urinating.  Skin: Negative for rash.  Allergic/Immunologic: Positive for environmental allergies. Negative for food allergies.  Neurological: Negative for headaches.   Objective: BP 130/78 (BP Location: Left Arm, Patient Position: Sitting, Cuff Size: Normal)   Pulse 100   Temp 98 F (36.7 C) (Temporal)   Resp 16   SpO2 95%  There is no height or weight on file to calculate BMI. Physical Exam  Constitutional: She is  oriented to person, place, and time. She appears well-developed and well-nourished.  HENT:  Head: Normocephalic and atraumatic.  Right Ear: External ear normal.  Left Ear: External ear normal.  Nose: Nose normal.  Mouth/Throat: Oropharynx is clear and moist.  Eyes: Conjunctivae and EOM are normal.  Neck: Neck supple.  Cardiovascular: Normal rate, regular rhythm and normal heart sounds. Exam reveals no gallop and no friction rub.  No murmur heard. Pulmonary/Chest: Effort normal and breath sounds normal. She has no wheezes. She has no rales.  Abdominal: Soft.  Neurological: She is alert and oriented to person, place, and time.  Skin: Skin is warm. No rash noted.  Psychiatric: She has a normal mood and affect. Her behavior is normal.  Nursing note and vitals reviewed.  Previous notes and tests were reviewed. The plan was reviewed with the patient/family, and all questions/concerned were addressed.  It was my pleasure to see Erin Collins today and participate in her care. Please feel free to contact me with any questions or concerns.  Sincerely,  Rexene Alberts, DO Allergy & Immunology  [facility] office: [phone] [facility] office: [place] office: [phone]

## 2019-09-09 NOTE — Assessment & Plan Note (Signed)
Past history - Eucrisa not effective.  Interim history - improved with Eucerin.   Continue proper skin care measures.

## 2019-09-09 NOTE — Assessment & Plan Note (Signed)
Past history - Diagnosed with asthma over 19 years ago. Currently on Breo 200 1 puff QD and albuterol as needed but still waking up every night due to SOB for the past 1 year requiring albuterol. Patient moved in with her parents who have cats and dogs at home during this time. Interim history - doing well up until 2 weeks ago with cold weather. Having some increased symptoms and using albuterol in the mornings. Based on history, may be irritated from PND.   Today's spirometry was normal.   Daily controller medication(s):Continue Breo 200 1 puff once a day and rinse mouth afterwards + Singulair 10mg  daily ? Continue Spiriva 1.34mcg 2 puffs once a day.  Prior to physical activity:May use albuterol rescue inhaler 2 puffs 5 to 15 minutes prior to strenuous physical activities.  Rescue medications:May use albuterol rescue inhaler 2 puffs or nebulizer every 4 to 6 hours as needed for shortness of breath, chest tightness, coughing, and wheezing. Monitor frequency of use.

## 2019-09-09 NOTE — Assessment & Plan Note (Signed)
Reactions to amoxicillin, cefuroxime, hydrocodone, levofloxacin, morphine, oxycodone, and propoxyphene in the past.   Continue to avoid all these medications.  

## 2019-09-20 ENCOUNTER — Encounter: Payer: Self-pay | Admitting: Internal Medicine

## 2019-09-20 ENCOUNTER — Ambulatory Visit (INDEPENDENT_AMBULATORY_CARE_PROVIDER_SITE_OTHER): Payer: BC Managed Care – PPO | Admitting: Internal Medicine

## 2019-09-20 ENCOUNTER — Other Ambulatory Visit: Payer: Self-pay

## 2019-09-20 VITALS — BP 116/81 | HR 96 | Temp 98.2°F | Ht 64.0 in | Wt 182.2 lb

## 2019-09-20 DIAGNOSIS — S46812A Strain of other muscles, fascia and tendons at shoulder and upper arm level, left arm, initial encounter: Secondary | ICD-10-CM | POA: Diagnosis not present

## 2019-09-20 DIAGNOSIS — X58XXXA Exposure to other specified factors, initial encounter: Secondary | ICD-10-CM

## 2019-09-20 DIAGNOSIS — S46819A Strain of other muscles, fascia and tendons at shoulder and upper arm level, unspecified arm, initial encounter: Secondary | ICD-10-CM | POA: Insufficient documentation

## 2019-09-20 MED ORDER — MELOXICAM 15 MG PO TABS: 15.0000 mg | ORAL_TABLET | Freq: Every day | ORAL | 0 refills | Status: DC

## 2019-09-20 NOTE — Patient Instructions (Addendum)
Thank you for allowing [place] to care for you  Your neck and shoulder pain is like due to strain of your trapezius muscle - Please take Mobic 15mg  Daily for 10 days, then Ibuprofen as need after that - Apply heat several time per day - Gentle massage and stretching may also help with your symptoms

## 2019-09-20 NOTE — Progress Notes (Signed)
   CC: Left neck and shoulder pain  HPI:  [name] is a 43 y.o. F with PMHx listed below presenting for Left neck and shoulder pain. Please see the A&P for the status of the patient's chronic medical problems.   Past Medical History:  Diagnosis Date  . Anemia   . Angio-edema   . Asthma   . Basal cell carcinoma ~ 1999   collarbone  . Blood clot in vein ~ 2008   "left breast"   . Complication of anesthesia    "I have trouble waking up afterwards" (12/18/2017)  . Eczema   . Family history of adverse reaction to anesthesia    "mom has trouble waking up afterwards" (12/18/2017)  . Fluttering heart    "comes and goes" (12/18/2017)  . GERD (gastroesophageal reflux disease)   . Headache    "weekly" (12/18/2017)  . History of kidney stones   . Recurrent upper respiratory infection (URI)   . 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 Torticollis   . Vertigo    Review of Systems:  Performed and all others negative.  Physical Exam:  Vitals:   09/20/19 0931  BP: 116/81  Pulse: 96  Temp: 98.2 F (36.8 C)  TempSrc: Oral  SpO2: 98%  Weight: 182 lb 3.2 oz (82.6 kg)  Height: 5\' 4"  (1.626 m)   Physical Exam Constitutional:      General: She is not in acute distress.    Appearance: Normal appearance.  Cardiovascular:     Rate and Rhythm: Normal rate and regular rhythm.     Pulses: Normal pulses.     Heart sounds: Normal heart sounds.  Pulmonary:     Effort: Pulmonary effort is normal. No respiratory distress.     Breath sounds: Normal breath sounds.  Abdominal:     General: Bowel sounds are normal. There is no distension.     Palpations: Abdomen is soft.     Tenderness: There is no abdominal tenderness.  Musculoskeletal:        General: No swelling or deformity.     Comments: Neck/Left Trapezius: Full ROM with some pain on right rotation and right lateral flexion of her neck. Mild tenderness to palpation, but no warmth nor erythema. L Shoulder: Full ROM  Skin:  General: Skin is warm and dry.     Findings: Bruising present.     Comments: Bruising on bilateral arms, reportedly from controlling a dog.  Neurological:     General: No focal deficit present.     Mental Status: Mental status is at baseline.    Assessment & Plan:   See Encounters Tab for problem based charting.  Patient discussed with Dr. Rebeca Alert

## 2019-09-20 NOTE — Progress Notes (Signed)
Internal Medicine Clinic Attending  Case discussed with Dr. Melvin at the time of the visit.  We reviewed the resident's history and exam and pertinent patient test results.  I agree with the assessment, diagnosis, and plan of care documented in the resident's note.  Alexander Raines, M.D., Ph.D.  

## 2019-09-20 NOTE — Assessment & Plan Note (Signed)
Patient reports 2 weeks of tight, sore left neck, shoulder and back. The pain has been constant and is aggravated when she turns her head to the right or laterally flexes to the right. She states that pain has gradually improved to about 3/10 today. 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 She has tried a daily ibuprofen with minimal relief. She also reports some mild intermittent tail bone pian from sitting on hard surfaces and intermittent knee pain (neither of which are a significant nor persistnet)  On exam she has Full ROM with some pain on right rotation and right lateral flexion of her neck. Mild tenderness to palpation, but no warmth nor erythema.  Presentation consistent with trapezius [place] will treat with heat, rest, NSAIDs, and gentle massage/strectching. - Mobic 15mg  Daily for 10 days, then Ibuprofen PRN after that - Apply heat three time daily - Intermittent Stretching and Gentle Massage - Follow up as needed

## 2019-10-21 ENCOUNTER — Other Ambulatory Visit: Payer: Self-pay

## 2019-10-21 ENCOUNTER — Other Ambulatory Visit: Payer: Self-pay | Admitting: Internal Medicine

## 2019-10-21 ENCOUNTER — Telehealth: Payer: Self-pay

## 2019-10-21 DIAGNOSIS — R6889 Other general symptoms and signs: Secondary | ICD-10-CM

## 2019-10-21 DIAGNOSIS — Z20822 Contact with and (suspected) exposure to covid-19: Secondary | ICD-10-CM

## 2019-10-21 NOTE — Telephone Encounter (Signed)
Returned call to patient. Patient with hx of asthma and seasonal allergies now c/o headache x 2 weeks and sore throat. Recently learned a co-worker tested positive for Covid. She is heading to [place] testing site. Will ask PCP to place future lab order. 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 Advised to quarantine in her room, use separate bathroom, wear mask at all times in any common areas, not cook for them, wash hands diligently and keep all surfaces clean. Hubbard Hartshorn, BSN, RN-BC

## 2019-10-21 NOTE — Addendum Note (Signed)
Addended by: Maudie Mercury C on: 10/21/2019 09:51 AM   Modules accepted: Orders

## 2019-10-21 NOTE — Telephone Encounter (Signed)
Requesting to speak with a nurse about getting COVID-19 test. Please call pt back.

## 2019-10-22 LAB — NOVEL CORONAVIRUS, NAA: SARS-CoV-2, NAA: NOT DETECTED

## 2019-11-06 ENCOUNTER — Encounter: Payer: Self-pay | Admitting: Allergy

## 2019-11-06 ENCOUNTER — Other Ambulatory Visit: Payer: Self-pay

## 2019-11-06 ENCOUNTER — Ambulatory Visit (INDEPENDENT_AMBULATORY_CARE_PROVIDER_SITE_OTHER): Payer: BC Managed Care – PPO | Admitting: Allergy

## 2019-11-06 VITALS — BP 120/84 | HR 80 | Temp 97.3°F | Resp 16 | Ht 64.0 in

## 2019-11-06 DIAGNOSIS — Z889 Allergy status to unspecified drugs, medicaments and biological substances status: Secondary | ICD-10-CM | POA: Diagnosis not present

## 2019-11-06 DIAGNOSIS — L2089 Other atopic dermatitis: Secondary | ICD-10-CM | POA: Diagnosis not present

## 2019-11-06 DIAGNOSIS — R12 Heartburn: Secondary | ICD-10-CM

## 2019-11-06 DIAGNOSIS — J3089 Other allergic rhinitis: Secondary | ICD-10-CM

## 2019-11-06 DIAGNOSIS — J302 Other seasonal allergic rhinitis: Secondary | ICD-10-CM

## 2019-11-06 DIAGNOSIS — H1010 Acute atopic conjunctivitis, unspecified eye: Secondary | ICD-10-CM

## 2019-11-06 DIAGNOSIS — J4540 Moderate persistent asthma, uncomplicated: Secondary | ICD-10-CM | POA: Diagnosis not present

## 2019-11-06 MED ORDER — TRIAMCINOLONE ACETONIDE 0.1 % EX OINT: TOPICAL_OINTMENT | CUTANEOUS | 2 refills | Status: DC

## 2019-11-06 MED ORDER — CARBINOXAMINE MALEATE 4 MG PO TABS: 1.0000 | ORAL_TABLET | Freq: Two times a day (BID) | ORAL | 5 refills | Status: DC

## 2019-11-06 NOTE — Assessment & Plan Note (Signed)
Past history - Diagnosed with asthma over 19 years ago. Currently on Breo 200 1 puff QD and albuterol as needed but still waking up every night due to SOB for the past 1 year requiring albuterol. Patient moved in with her parents who have cats and dogs at home during this time. Interim history - well controlled.  Today's spirometry was normal.   Act score: 24  Daily controller medication(s):  Try Trelegy 1 puff daily and rinse mouth afterwards. Sample given.  ? If this works just as well then let us know and will send in prescription for this.  ? This replaces Breo and Spiriva for now.   Continue Singulair 10mg  daily  Prior to physical activity:May use albuterol rescue inhaler 2 puffs 5 to 15 minutes prior to strenuous physical activities.  Rescue medications:May use albuterol rescue inhaler 2 puffs or nebulizer every 4 to 6 hours as needed for shortness of breath, chest tightness, coughing, and wheezing. Monitor frequency of use

## 2019-11-06 NOTE — Assessment & Plan Note (Signed)
Past history - reactions to amoxicillin, cefuroxime, hydrocodone, levofloxacin, morphine, oxycodone, and propoxyphene in the past.   Continue to avoid all these medications.  

## 2019-11-06 NOTE — Patient Instructions (Addendum)
Moderate persistent asthma  Today's spirometry was normal.   Daily controller medication(s):  Try Trelegy 1 puff daily and rinse mouth afterwards.  ? If this works just as well then let us know and will send in prescription for this.  ? Stop Breo and Spiriva for now.   Continue Singulair 10mg  daily  Prior to physical activity:May use albuterol rescue inhaler 2 puffs 5 to 15 minutes prior to strenuous physical activities.  Rescue medications:May use albuterol rescue inhaler 2 puffs or nebulizer every 4 to 6 hours as needed for shortness of breath, chest tightness, coughing, and wheezing. Monitor frequency of use. Asthma control goals:  Full participation in all desired activities (may need albuterol before activity) Albuterol use two times or less a week on average (not counting use with activity) Cough interfering with sleep two times or less a month Oral steroids no more than once a year No hospitalizations  Seasonal and perennial allergic rhinoconjunctivitis 2019 skin testing showed, positive to dust mites, cat, dog, grass and horse.  I sent in carbinoxamine 4mg  tablets. May take 1 to 1.5 tablet twice a day.  If this works just as well then we can use this instead of Ryvent.  May use Nasacort 1 spray 2 times a day for nasal congestion as needed.   May use Azelastine nasal spray 2 sprays 2 times a day for postnasal drip as needed.   May use Zaditor eye drops as needed for itchy/watery eyes.  May decrease pepcid 20mg  to once a day. If having worsening symptoms then increase back up to twice a day.   Other atopic dermatitis  Continue proper skin care measures.  May use triamcinolone 0.1% ointment twice a day as needed for the hands. Do not use on the face, neck, armpits or groin area. Do not use more than 3 weeks in a row.   Multiple drug allergies Reactions to amoxicillin, cefuroxime, hydrocodone, levofloxacin, morphine, oxycodone, and propoxyphene in the past.    Continue to avoid all these medications.   Follow up in 3 months or sooner if needed.

## 2019-11-06 NOTE — Assessment & Plan Note (Signed)
Past history - Eucrisa not effective.  Interim history - usually flares on the hands during the winter months.  Continue proper skin care measures.  May use triamcinolone 0.1% ointment twice a day as needed for the hands. Do not use on the face, neck, armpits or groin area. Do not use more than 3 weeks in a row.

## 2019-11-06 NOTE — Assessment & Plan Note (Signed)
Improved symptoms after meals with taking Pepcid 20 mg twice a day.  Advised patient to decrease to Pepcid 20mg  once a day to see if that is enough control her symptoms.

## 2019-11-06 NOTE — Progress Notes (Signed)
Follow Up Note  RE: Erin Collins MRN: [id number] DOB: 1976/02/12 Date of Office Visit: 11/06/2019  Referring provider: Maudie Mercury, MD Primary care provider: Maudie Mercury, MD  Chief Complaint: Asthma and Allergic Rhinitis   History of Present Illness: I had the pleasure of seeing Erin Collins for a follow up visit at the [facility] on 11/06/2019. She is a 43 y.o. female, who is being followed for asthma, allergic rhinoconjunctivitis, atopic dermatitis and multiple drug allergies. Today she is here for regular follow up visit. Her previous allergy office visit was on 09/09/2019 with Dr. Maudie Mercury.   Moderate persistent asthma without complication Doing much better. Only had to use albuterol 3 times the last month. Currently on Breo 200 1 puff daily and Spiriva 2 puffs once a day and Singulair 10mg  daily.  Otherwise denies any SOB, coughing, wheezing, chest tightness, nocturnal awakenings, ER/urgent care visits or prednisone use since the last visit.  Seasonal and perennial allergic rhinoconjunctivitis Patient had sneezing episodes last week mainly when she was around wood-burning.  She liked using Ryvent but has been having issues with delivery.  Using Nasacort and azelastine as needed with good benefit. Takes Zaditor daily which helps but she does have some dry yes and uses lubricating drops.  Took Pepcid 20mg  twice a day.  Does have some migraines frequently.  Other atopic dermatitis Doing well but usually flares in the cold weather.   Assessment and Plan: Erin Collins is a 43 y.o. female with: Moderate persistent asthma without complication Past history - Diagnosed with asthma over 19 years ago. Currently on Breo 200 1 puff QD and albuterol as needed but still waking up every night due to SOB for the past 1 year requiring albuterol. Patient moved in with her parents who have cats and dogs at home during this time. Interim history - well controlled.  Today's  spirometry was normal.   Act score: 24  Daily controller medication(s):  Try Trelegy 1 puff daily and rinse mouth afterwards. Sample given.  ? If this works just as well then let us know and will send in prescription for this.  ? This replaces Breo and Spiriva for now.   Continue Singulair 10mg  daily  Prior to physical activity:May use albuterol rescue inhaler 2 puffs 5 to 15 minutes prior to strenuous physical activities.  Rescue medications:May use albuterol rescue inhaler 2 puffs or nebulizer every 4 to 6 hours as needed for shortness of breath, chest tightness, coughing, and wheezing. Monitor frequency of use  Seasonal and perennial allergic rhinoconjunctivitis Past history - Perennial rhinoconjunctivitis symptoms for the past 20 years with worsening in the spring and fall.  Patient has tried Zyrtec, Singulair, Nasacort, saline nasal spray and Zaditor eyedrops with some benefit.  Patient used to be on allergy immunotherapy for 10 years with good benefit. 2019 skin testing showed, positive to dust mites, cat, dog, grass and horse. Interim history - doing much better with Ryvent. Some issues with delivery. Not ready to commit to allergy injections yet.    I sent in carbinoxamine 4mg  tablets. May take 1 to 1.5 tablet twice a day.  If this works just as well then we can use this instead of Ryvent.  May use Nasacort 1 spray 2 times a day for nasal congestion as needed.   May use Azelastine nasal spray 2 sprays 2 times a day for postnasal drip as needed.   May use Zaditor eye drops as needed for itchy/watery eyes.  Patient  will let us know when ready for allergy immunotherapy.  Other atopic dermatitis Past history - Eucrisa not effective.  Interim history - usually flares on the hands during the winter months.  Continue proper skin care measures.  May use triamcinolone 0.1% ointment twice a day as needed for the hands. Do not use on the face, neck, armpits or groin area. Do  not use more than 3 weeks in a row.   Multiple drug allergies Past history - reactions to amoxicillin, cefuroxime, hydrocodone, levofloxacin, morphine, oxycodone, and propoxyphene in the past.   Continue to avoid all these medications.   Heartburn Improved symptoms after meals with taking Pepcid 20 mg twice a day.  Advised patient to decrease to Pepcid 20mg  once a day to see if that is enough control her symptoms.  Return in about 3 months (around 02/06/2020).  Meds ordered this encounter  Medications  . Carbinoxamine Maleate 4 MG TABS    Sig: Take 1-2 tablets (4-8 mg total) by mouth 2 (two) times daily.    Dispense:  120 tablet    Refill:  5  . triamcinolone ointment (KENALOG) 0.1 %    Sig: for the hands. May use twice a day as needed. Do not use on the face, neck, armpits or groin area. Do not use more than 3 weeks in a row.    Dispense:  60 g    Refill:  2   Diagnostics: Spirometry:  Tracings reviewed. Her effort: Good reproducible efforts. FVC: 3.19 L FEV1: 2.50 L, 84% predicted FEV1/FVC ratio: 78% Interpretation: Spirometry consistent with normal pattern.  Please see scanned spirometry results for details.  Medication List:  Current Outpatient Medications  Medication Sig Dispense Refill  . albuterol (ACCUNEB) 0.63 MG/3ML nebulizer solution Take 6 mLs (1.26 mg total) by nebulization every 4 (four) hours as needed for wheezing or shortness of breath. 75 mL 0  . albuterol (PROVENTIL HFA;VENTOLIN HFA) 108 (90 Base) MCG/ACT inhaler Inhale 1-2 puffs into the lungs every 6 (six) hours as needed for wheezing or shortness of breath. IM program, Ventolin if ok through 340B 1 Inhaler 10  . azelastine (ASTELIN) 0.1 % nasal spray Take 1-2 sprays in each nostril 1-2 times a day as needed for nasal drainage. 30 mL 5  . Carbinoxamine Maleate (RYVENT) 6 MG TABS Take 6 mg by mouth every 12 (twelve) hours as needed. 60 tablet 2  . famotidine (PEPCID) 40 MG tablet Take 1/2 tablet twice a  day. 30 tablet 2  . fluticasone furoate-vilanterol (BREO ELLIPTA) 200-25 MCG/INH AEPB Inhale 1 puff into the lungs daily. 60 each 11  . Guaifenesin 1200 MG TB12 Take 1 tablet (1,200 mg total) by mouth 2 (two) times daily. 20 each 0  . meloxicam (MOBIC) 15 MG tablet Take 1 tablet (15 mg total) by mouth daily. 10 tablet 0  . montelukast (SINGULAIR) 10 MG tablet Take 1 tablet (10 mg total) by mouth at bedtime. IM program 30 tablet 5  . Tiotropium Bromide Monohydrate (SPIRIVA RESPIMAT) 1.25 MCG/ACT AERS Inhale 2 puffs into the lungs daily. 1 Inhaler 5  . Carbinoxamine Maleate 4 MG TABS Take 1-2 tablets (4-8 mg total) by mouth 2 (two) times daily. 120 tablet 5  . triamcinolone ointment (KENALOG) 0.1 % for the hands. May use twice a day as needed. Do not use on the face, neck, armpits or groin area. Do not use more than 3 weeks in a row. 60 g 2   No current facility-administered medications for this  visit.    Allergies: Allergies  Allergen Reactions  . Amoxicillin Hives  . Cefuroxime Axetil Hives  . Hydrocodone Itching  . Levofloxacin Other (See Comments)    tendonitis  . Morphine Itching  . Oxycodone-Acetaminophen Itching  . Penicillins Hives    Has patient had a PCN reaction causing immediate rash, facial/tongue/throat swelling, SOB or lightheadedness with hypotension: Yes Has patient had a PCN reaction causing severe rash involving mucus membranes or skin necrosis: No Has patient had a PCN reaction that required hospitalization: No Has patient had a PCN reaction occurring within the last 10 years: No If all of the above answers are "NO", then may proceed with Cephalosporin use.   Marland Kitchen Propoxyphene Itching   I reviewed her past medical history, social history, family history, and environmental history and no significant changes have been reported from her previous visit.  Review of Systems  Constitutional: Negative for appetite change, chills, fever and unexpected weight change.  HENT:  Positive for postnasal drip. Negative for congestion and rhinorrhea.   Eyes: Negative for itching.  Respiratory: Negative for cough, chest tightness, shortness of breath and wheezing.   Cardiovascular: Negative for chest pain.  Gastrointestinal: Negative for abdominal pain.  Genitourinary: Negative for difficulty urinating.  Skin: Negative for rash.  Allergic/Immunologic: Positive for environmental allergies. Negative for food allergies.  Neurological: Positive for headaches.   Objective: BP 120/84   Pulse 80   Temp (!) 97.3 F (36.3 C) (Temporal)   Resp 16   Ht 5\' 4"  (1.626 m)   SpO2 99%   BMI 31.27 kg/m  Body mass index is 31.27 kg/m. Physical Exam  Constitutional: She is oriented to person, place, and time. She appears well-developed and well-nourished.  HENT:  Head: Normocephalic and atraumatic.  Right Ear: External ear normal.  Left Ear: External ear normal.  Nose: Nose normal.  Mouth/Throat: Oropharynx is clear and moist.  Eyes: Conjunctivae and EOM are normal.  Neck: Neck supple.  Cardiovascular: Normal rate, regular rhythm and normal heart sounds. Exam reveals no gallop and no friction rub.  No murmur heard. Pulmonary/Chest: Effort normal and breath sounds normal. She has no wheezes. She has no rales.  Abdominal: Soft.  Neurological: She is alert and oriented to person, place, and time.  Skin: Skin is warm. No rash noted.  Psychiatric: She has a normal mood and affect. Her behavior is normal.  Nursing note and vitals reviewed.  Previous notes and tests were reviewed. The plan was reviewed with the patient/family, and all questions/concerned were addressed.  It was my pleasure to see Erin Collins today and participate in her care. Please feel free to contact me with any questions or concerns.  Sincerely,  Rexene Alberts, DO Allergy & Immunology  [facility] office: [phone] [facility] office: [place] office:  [phone]

## 2019-11-06 NOTE — Assessment & Plan Note (Signed)
Past history - Perennial rhinoconjunctivitis symptoms for the past 20 years with worsening in the spring and fall.  Patient has tried Zyrtec, Singulair, Nasacort, saline nasal spray and Zaditor eyedrops with some benefit.  Patient used to be on allergy immunotherapy for 10 years with good benefit. 2019 skin testing showed, positive to dust mites, cat, dog, grass and horse. Interim history - doing much better with Ryvent. Some issues with delivery. Not ready to commit to allergy injections yet.    I sent in carbinoxamine 4mg  tablets. May take 1 to 1.5 tablet twice a day.  If this works just as well then we can use this instead of Ryvent.  May use Nasacort 1 spray 2 times a day for nasal congestion as needed.   May use Azelastine nasal spray 2 sprays 2 times a day for postnasal drip as needed.   May use Zaditor eye drops as needed for itchy/watery eyes.  Patient will let us know when ready for allergy immunotherapy.

## 2019-12-23 ENCOUNTER — Other Ambulatory Visit: Payer: Self-pay

## 2019-12-24 ENCOUNTER — Telehealth: Payer: Self-pay | Admitting: Allergy

## 2019-12-24 DIAGNOSIS — Z1152 Encounter for screening for COVID-19: Secondary | ICD-10-CM

## 2019-12-24 MED ORDER — ALBUTEROL SULFATE HFA 108 (90 BASE) MCG/ACT IN AERS: 1.0000 | INHALATION_SPRAY | Freq: Four times a day (QID) | RESPIRATORY_TRACT | 10 refills | Status: DC | PRN

## 2019-12-24 NOTE — Telephone Encounter (Signed)
Please place an order for COVID TESTING, pt will call and make appt for testing Denies fevers Does have a cough, scant amt of production Started feeling tired this weekend Denies chest pain States she has some dizziness H/a off and on Pt states people she had lunch with on Christmas have not been sick but they all dont wear masks and some go to bars and this makes her nervous Grandparents have had a cough and not feeling well since Christmas States she does have asthma and she feels no different than when she has an asthma flare. She was given the [facility] testing # for appt Cautioned to keep 6 ft from each family member in home and wear mask She was cautioned to self quarantine until her results come back Drink plenty of fluids Rest as much as possible Use sanitize on dishwasher Wash linens on sanitize or hot Keep all surfaces clean Keep hands clean and away from face Open windows in home daily and let fresh air circulate Call 911 for short of breath or chest pain

## 2019-12-24 NOTE — Telephone Encounter (Signed)
Dr. Maudie Mercury please advise. Unsure about the COVID order message below. However, it looks like we sent her home with a sample of Trelegy to try in place of the Spiriva and Breo at her last office visit.

## 2019-12-24 NOTE — Telephone Encounter (Signed)
Spoke with the patient Appt has been sch for 02/02/2019 with her PCP.

## 2019-12-24 NOTE — Telephone Encounter (Signed)
Please call patient and ask her if the Trelegy did not work as well as Firefighter and Spiriva?  Instructions as per my last note:   Daily controller medication(s):  Try Trelegy 1 puff daily and rinse mouth afterwards. Sample given.  ? If this works just as well then let us know and will send in prescription for this.  ? This replaces Breo and Spiriva for now.   Continue Singulair 10mg  daily

## 2019-12-24 NOTE — Telephone Encounter (Signed)
Pt called and needs to have these rx refilled Ventolin, Spiriva, Breo sent to [facility]. [phone]

## 2019-12-25 ENCOUNTER — Ambulatory Visit: Payer: BC Managed Care – PPO | Attending: Internal Medicine

## 2019-12-25 DIAGNOSIS — Z20822 Contact with and (suspected) exposure to covid-19: Secondary | ICD-10-CM | POA: Diagnosis not present

## 2019-12-25 MED ORDER — SPIRIVA RESPIMAT 1.25 MCG/ACT IN AERS: 2.0000 | INHALATION_SPRAY | Freq: Every day | RESPIRATORY_TRACT | 5 refills | Status: DC

## 2019-12-25 MED ORDER — BREO ELLIPTA 200-25 MCG/INH IN AEPB: 1.0000 | INHALATION_SPRAY | Freq: Every day | RESPIRATORY_TRACT | 5 refills | Status: DC

## 2019-12-25 NOTE — Telephone Encounter (Signed)
Patient was stating Trelegy was causing tongue swelling even though she rinsed her mouth out.

## 2019-12-25 NOTE — Telephone Encounter (Signed)
Ok. Order placed.

## 2019-12-25 NOTE — Telephone Encounter (Signed)
Called and left voicemail for patient to call the office back.

## 2019-12-25 NOTE — Addendum Note (Signed)
Addended by: Garnet Sierras on: 12/25/2019 09:27 AM   Modules accepted: Orders

## 2019-12-25 NOTE — Telephone Encounter (Signed)
I sent in new Rx for Breo 200 1 puff daily and Spiriva 1.9mcg 2 puffs daily.

## 2019-12-27 ENCOUNTER — Encounter (INDEPENDENT_AMBULATORY_CARE_PROVIDER_SITE_OTHER): Payer: Self-pay

## 2019-12-27 ENCOUNTER — Encounter: Payer: Self-pay | Admitting: *Deleted

## 2019-12-27 ENCOUNTER — Ambulatory Visit (INDEPENDENT_AMBULATORY_CARE_PROVIDER_SITE_OTHER): Payer: BC Managed Care – PPO | Admitting: Internal Medicine

## 2019-12-27 ENCOUNTER — Other Ambulatory Visit: Payer: Self-pay

## 2019-12-27 ENCOUNTER — Telehealth: Payer: Self-pay | Admitting: Internal Medicine

## 2019-12-27 ENCOUNTER — Telehealth: Payer: Self-pay | Admitting: *Deleted

## 2019-12-27 DIAGNOSIS — J45909 Unspecified asthma, uncomplicated: Secondary | ICD-10-CM | POA: Diagnosis not present

## 2019-12-27 DIAGNOSIS — U071 COVID-19: Secondary | ICD-10-CM | POA: Diagnosis not present

## 2019-12-27 LAB — NOVEL CORONAVIRUS, NAA: SARS-CoV-2, NAA: DETECTED — AB

## 2019-12-27 NOTE — Addendum Note (Signed)
Addended by: Ina Homes T on: 12/27/2019 11:07 AM   Modules accepted: Level of Service

## 2019-12-27 NOTE — Progress Notes (Signed)
Internal Medicine Clinic Attending  Case discussed with Dr. Tarri Abernethy  at the time of the visit.  We reviewed the resident's history and pertinent patient test results.  I agree with the assessment, diagnosis, and plan of care documented in the resident's note.

## 2019-12-27 NOTE — Telephone Encounter (Signed)
Received BPA on 11/25/20 due to worsening weakness; pt had visit with provider today; the pt says spoke with her provider today; pt advised to prioritize her activities, stay hydrated, eat, and rest; pt also advised to proceed to the ED if her weakness makes her unable to stand, or if she has to hold onto something for balance; she verbalized understanding.

## 2019-12-27 NOTE — Progress Notes (Signed)
   CC: COVID-19  This is a telephone encounter between [name] and [employer] on 12/27/2019 for COVID-19. The visit was conducted with the patient located at home and [facility] at [facility]. The patient's identity was confirmed using their DOB and current address. The patient has consented to being evaluated through a telephone encounter and understands the associated risks (an examination cannot be done and the patient may need to come in for an appointment) / benefits (allows the patient to remain at home, decreasing exposure to coronavirus). I personally spent 18 minutes on medical discussion.   HPI:  [name] is a 44 y.o. with PMH as below.   Please see A&P for assessment of the patient's acute and chronic medical conditions.   Past Medical History:  Diagnosis Date  . Anemia   . Angio-edema   . Asthma   . Basal cell carcinoma ~ 1999   collarbone  . Blood clot in vein ~ 2008   "left breast"   . Complication of anesthesia    "I have trouble waking up afterwards" (12/18/2017)  . Diarrhea 04/01/2019  . Eczema   . Family history of adverse reaction to anesthesia    "mom has trouble waking up afterwards" (12/18/2017)  . Fluttering heart    "comes and goes" (12/18/2017)  . GERD (gastroesophageal reflux disease)   . Headache    "weekly" (12/18/2017)  . History of kidney stones   . Recurrent upper respiratory infection (URI)   . 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 Torticollis   . Vertigo    Review of Systems:  Performed and all others negative.  Assessment & Plan:   See Encounters Tab for problem based charting.  Patient discussed with Dr. Evette Doffing

## 2019-12-27 NOTE — Telephone Encounter (Signed)
Pt requesting a nurse to call back regarding COVID symptoms [phone]

## 2019-12-27 NOTE — Assessment & Plan Note (Addendum)
Patient calls and with concerns after being recently diagnosed with COVID-19. On Tuesday she developed arthralgias, fevers, myalgias, pleuritic chest pain, mild SHOB, and lack of taste/smell. She subsequently tested positive for COVID on 1/6. Since that time she has been isolating in her room. She is not noticed any wheezing and has had some mild shortness of breath with exertion. She is primarily hindered by fatigue. She has been using her rescue inhaler 3 to 4 times daily. She is not used any over-the-counter medications.  She was last hospitalized for her asthma in 2018. This was also her last exacerbation. She spent two nights in the hospital. She is never been intubated for her asthma.   During our discussion she is able to talk in complete sentences.  A/P: - [id number] with elevated risk due to underlying asthma. Will order monoclonal antibody infusion. - Discuss symptomatic management  - Discussed return precautions and recommended she get a pulse oximeter to monitor her oxygen. - Discuss isolation precautions  - Will enroll her in the my chart home monitoring program.

## 2019-12-27 NOTE — Telephone Encounter (Signed)
RTC , pt tested positive for Covid on 12/25/19.  C/O fever 101.2 at highest, muscle aches with R flank pain.  Pt also concerned because she has hx of asthma, on several inhalers, has mild SOB, using rescue inhaler.  Offered telehealth appt in [facility], pt accepted.  Added @ [id number]. SChaplin, RN,BSN

## 2019-12-28 ENCOUNTER — Telehealth: Payer: Self-pay

## 2019-12-28 ENCOUNTER — Emergency Department (HOSPITAL_BASED_OUTPATIENT_CLINIC_OR_DEPARTMENT_OTHER): Payer: BC Managed Care – PPO

## 2019-12-28 ENCOUNTER — Encounter (INDEPENDENT_AMBULATORY_CARE_PROVIDER_SITE_OTHER): Payer: Self-pay

## 2019-12-28 ENCOUNTER — Emergency Department (HOSPITAL_BASED_OUTPATIENT_CLINIC_OR_DEPARTMENT_OTHER)
Admission: EM | Admit: 2019-12-28 | Discharge: 2019-12-28 | Disposition: A | Discharge status: Home / Self Care | Payer: BC Managed Care – PPO | Attending: Emergency Medicine | Admitting: Emergency Medicine

## 2019-12-28 ENCOUNTER — Other Ambulatory Visit: Payer: Self-pay

## 2019-12-28 ENCOUNTER — Encounter (HOSPITAL_BASED_OUTPATIENT_CLINIC_OR_DEPARTMENT_OTHER): Payer: Self-pay | Admitting: *Deleted

## 2019-12-28 DIAGNOSIS — N3000 Acute cystitis without hematuria: Secondary | ICD-10-CM | POA: Diagnosis not present

## 2019-12-28 DIAGNOSIS — M549 Dorsalgia, unspecified: Secondary | ICD-10-CM | POA: Diagnosis not present

## 2019-12-28 DIAGNOSIS — M791 Myalgia, unspecified site: Secondary | ICD-10-CM | POA: Diagnosis not present

## 2019-12-28 DIAGNOSIS — Z79899 Other long term (current) drug therapy: Secondary | ICD-10-CM | POA: Diagnosis not present

## 2019-12-28 DIAGNOSIS — J45909 Unspecified asthma, uncomplicated: Secondary | ICD-10-CM | POA: Diagnosis not present

## 2019-12-28 DIAGNOSIS — R0602 Shortness of breath: Secondary | ICD-10-CM | POA: Diagnosis not present

## 2019-12-28 DIAGNOSIS — U071 COVID-19: Secondary | ICD-10-CM | POA: Diagnosis not present

## 2019-12-28 LAB — URINALYSIS, ROUTINE W REFLEX MICROSCOPIC
Bilirubin Urine: NEGATIVE
Glucose, UA: NEGATIVE mg/dL
Ketones, ur: 15 mg/dL — AB
Leukocytes,Ua: NEGATIVE
Nitrite: NEGATIVE
Protein, ur: NEGATIVE mg/dL
Specific Gravity, Urine: >1.03 — ABNORMAL HIGH (ref 1.005–1.030)
pH: 5.5 (ref 5.0–8.0)

## 2019-12-28 LAB — URINALYSIS, MICROSCOPIC (REFLEX)

## 2019-12-28 LAB — PREGNANCY, URINE: Preg Test, Ur: NEGATIVE

## 2019-12-28 MED ORDER — SULFAMETHOXAZOLE-TRIMETHOPRIM 800-160 MG PO TABS
1.0000 | ORAL_TABLET | Freq: Once | ORAL | Status: AC
  Administered 2019-12-28: 1 via ORAL
  Filled 2019-12-28: qty 1

## 2019-12-28 MED ORDER — SULFAMETHOXAZOLE-TRIMETHOPRIM 800-160 MG PO TABS: 1.0000 | ORAL_TABLET | Freq: Two times a day (BID) | ORAL | 0 refills | Status: AC

## 2019-12-28 MED ORDER — KETOROLAC TROMETHAMINE 30 MG/ML IJ SOLN
30.0000 mg | Freq: Once | INTRAMUSCULAR | Status: AC
  Administered 2019-12-28: 30 mg via INTRAMUSCULAR
  Filled 2019-12-28: qty 1

## 2019-12-28 NOTE — ED Notes (Signed)
X RAY at bedside 

## 2019-12-28 NOTE — ED Triage Notes (Signed)
Pt c/o 1 week of muscle pain ("burn when I try to get up and move"), fever, SOB with exertion, cough in the am, loss of taste and smell, right flank, and fatigue. Pt had + covid test on 1/6

## 2019-12-28 NOTE — ED Provider Notes (Signed)
[facility] EMERGENCY DEPARTMENT Provider Note   CSN: [id number] Arrival date & time: 12/28/19  2106     History Chief Complaint  Patient presents with  . Muscle Pain    Covid +    Erin Collins is a 44 y.o. female.  Pt presents to the ED today with cough and right sided back pain.  Pt started developing sx of Covid on Tuesday, 1/5.  She was tested on 1/6 and it was positive.  The pt had intermittent fevers for which she's been taking tylenol.  She has a lot of pain in her right back when she moves.          Past Medical History:  Diagnosis Date  . Anemia   . Angio-edema   . Asthma   . Basal cell carcinoma ~ 1999   collarbone  . Blood clot in vein ~ 2008   "left breast"   . Complication of anesthesia    "I have trouble waking up afterwards" (12/18/2017)  . Diarrhea 04/01/2019  . Eczema   . Family history of adverse reaction to anesthesia    "mom has trouble waking up afterwards" (12/18/2017)  . Fluttering heart    "comes and goes" (12/18/2017)  . GERD (gastroesophageal reflux disease)   . Headache    "weekly" (12/18/2017)  . History of kidney stones   . Recurrent upper respiratory infection (URI)   . 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 Torticollis   . Vertigo     Patient Active Problem List   Diagnosis Date Noted  . COVID-19 12/27/2019  . Heartburn 11/06/2019  . Trapezius muscle strain 09/20/2019  . Exposure to blood 02/05/2019  . Seasonal and perennial allergic rhinoconjunctivitis 12/31/2018  . Other atopic dermatitis 11/28/2018  . Multiple drug allergies 11/28/2018  . Healthcare maintenance 05/06/2018  . Moderate persistent asthma without complication [id number]    Past Surgical History:  Procedure Laterality Date  . BASAL CELL CARCINOMA EXCISION Right ~ 1999   "collarbone"  . DILATION AND CURETTAGE OF UTERUS  2005  . LAPAROSCOPIC ENDOMETRIOSIS FULGURATION  2005  . NASAL SEPTOPLASTY W/ TURBINOPLASTY Bilateral 2014  . SINOSCOPY      . TONSILLECTOMY  1980  . TUBAL LIGATION Right 2005  . TYMPANOSTOMY TUBE PLACEMENT       OB History   No obstetric history on file.     Family History  Problem Relation Age of Onset  . Allergic rhinitis Mother   . Asthma Mother     Social History   Tobacco Use  . Smoking status: Never Smoker  . Smokeless tobacco: Never Used  Substance Use Topics  . Alcohol use: Yes    Comment: 12/18/2017 "maybe 1/year on my birthday"  . Drug use: No    Home Medications Prior to Admission medications   Medication Sig Start Date End Date Taking? Authorizing Provider  albuterol (ACCUNEB) 0.63 MG/3ML nebulizer solution Take 6 mLs (1.26 mg total) by nebulization every 4 (four) hours as needed for wheezing or shortness of breath. 12/18/17   Mabe, Forbes Cellar, MD  albuterol (VENTOLIN HFA) 108 (90 Base) MCG/ACT inhaler Inhale 1-2 puffs into the lungs every 6 (six) hours as needed for wheezing or shortness of breath. IM program, Ventolin if ok through [facility] 12/24/19   Maudie Mercury, MD  azelastine (ASTELIN) 0.1 % nasal spray Take 1-2 sprays in each nostril 1-2 times a day as needed for nasal drainage. 05/23/19   Garnet Sierras, DO  Carbinoxamine Maleate (RYVENT) 6 MG TABS Take 6 mg by mouth every 12 (twelve) hours as needed. 09/09/19   Garnet Sierras, DO  Carbinoxamine Maleate 4 MG TABS Take 1-2 tablets (4-8 mg total) by mouth 2 (two) times daily. 11/06/19   Garnet Sierras, DO  famotidine (PEPCID) 40 MG tablet Take 1/2 tablet twice a day. 09/09/19   Garnet Sierras, DO  fluticasone furoate-vilanterol (BREO ELLIPTA) 200-25 MCG/INH AEPB Inhale 1 puff into the lungs daily. 12/25/19   Garnet Sierras, DO  Guaifenesin 1200 MG TB12 Take 1 tablet (1,200 mg total) by mouth 2 (two) times daily. 12/18/17   Lawyer, Harrell Gave, PA-C  meloxicam (MOBIC) 15 MG tablet Take 1 tablet (15 mg total) by mouth daily. 09/20/19   Neva Seat, MD  montelukast (SINGULAIR) 10 MG tablet Take 1 tablet (10 mg total) by mouth at bedtime. IM program  05/23/19   Garnet Sierras, DO  sulfamethoxazole-trimethoprim (BACTRIM DS) 800-160 MG tablet Take 1 tablet by mouth 2 (two) times daily for 7 days. 12/28/19 01/04/20  Isla Pence, MD  Tiotropium Bromide Monohydrate (SPIRIVA RESPIMAT) 1.25 MCG/ACT AERS Inhale 2 puffs into the lungs daily. 12/25/19   Garnet Sierras, DO  triamcinolone ointment (KENALOG) 0.1 % for the hands. May use twice a day as needed. Do not use on the face, neck, armpits or groin area. Do not use more than 3 weeks in a row. 11/06/19   Garnet Sierras, DO    Allergies    Amoxicillin, Cefuroxime axetil, Hydrocodone, Levofloxacin, Morphine, Oxycodone-acetaminophen, Penicillins, and Propoxyphene  Review of Systems   Review of Systems  Respiratory: Positive for cough.   Musculoskeletal: Positive for back pain.  All other systems reviewed and are negative.   Physical Exam Updated Vital Signs BP (!) 128/51   Pulse (!) 101   Temp 98.7 F (37.1 C) (Oral)   Resp 19   LMP 12/10/2019   SpO2 100%   Physical Exam Vitals and nursing note reviewed.  Constitutional:      Appearance: Normal appearance.  HENT:     Head: Normocephalic and atraumatic.     Right Ear: External ear normal.     Left Ear: External ear normal.     Nose: Nose normal.     Mouth/Throat:     Mouth: Mucous membranes are moist.     Pharynx: Oropharynx is clear.  Eyes:     Extraocular Movements: Extraocular movements intact.     Conjunctiva/sclera: Conjunctivae normal.     Pupils: Pupils are equal, round, and reactive to light.  Cardiovascular:     Rate and Rhythm: Normal rate and regular rhythm.     Pulses: Normal pulses.     Heart sounds: Normal heart sounds.  Pulmonary:     Effort: Pulmonary effort is normal.     Breath sounds: Normal breath sounds.  Abdominal:     General: Abdomen is flat. Bowel sounds are normal.     Palpations: Abdomen is soft.  Musculoskeletal:        General: Normal range of motion.     Cervical back: Normal range of motion and neck  supple.  Skin:    General: Skin is warm.     Capillary Refill: Capillary refill takes less than 2 seconds.  Neurological:     General: No focal deficit present.     Mental Status: She is alert and oriented to person, place, and time.  Psychiatric:        Mood and  Affect: Mood normal.        Behavior: Behavior normal.        Thought Content: Thought content normal.        Judgment: Judgment normal.     ED Results / Procedures / Treatments   Labs (all labs ordered are listed, but only abnormal results are displayed) Labs Reviewed  URINALYSIS, ROUTINE W REFLEX MICROSCOPIC - Abnormal; Notable for the following components:      Result Value   Specific Gravity, Urine >1.030 (*)    Hgb urine dipstick TRACE (*)    Ketones, ur 15 (*)    All other components within normal limits  URINALYSIS, MICROSCOPIC (REFLEX) - Abnormal; Notable for the following components:   Bacteria, UA MANY (*)    All other components within normal limits  PREGNANCY, URINE    EKG None  Radiology DG Chest Portable 1 View  Result Date: 12/28/2019 CLINICAL DATA:  Shortness of breath EXAM: PORTABLE CHEST 1 VIEW COMPARISON:  December 17, 2017 FINDINGS: The heart size and mediastinal contours are within normal limits. Both lungs are clear. The visualized skeletal structures are unremarkable. IMPRESSION: No active disease. Electronically Signed   By: Prudencio Pair M.D.   On: 12/28/2019 23:04    Procedures Procedures (including critical care time)  Medications Ordered in ED Medications  sulfamethoxazole-trimethoprim (BACTRIM DS) 800-160 MG per tablet 1 tablet (has no administration in time range)  ketorolac (TORADOL) 30 MG/ML injection 30 mg (30 mg Intramuscular Given 12/28/19 2200)    ED Course  I have reviewed the triage vital signs and the nursing notes.  Pertinent labs & imaging results that were available during my care of the patient were reviewed by me and considered in my medical decision making (see  chart for details).    MDM Rules/Calculators/A&P                      Pt is feeling better.  She does have some bacteria in her urine.  She is to be treated with bactrim.  She does not want anything for pain.  Return if worse.   Jesly Vanderjagt was evaluated in Emergency Department on 12/28/2019 for the symptoms described in the history of present illness. She was evaluated in the context of the global COVID-19 pandemic, which necessitated consideration that the patient might be at risk for infection with the SARS-CoV-2 virus that causes COVID-19. Institutional protocols and algorithms that pertain to the evaluation of patients at risk for COVID-19 are in a state of rapid change based on information released by regulatory bodies including the CDC and federal and state organizations. These policies and algorithms were followed during the patient's care in the ED.   Final Clinical Impression(s) / ED Diagnoses Final diagnoses:  [id number] virus infection  Myalgia  Acute cystitis without hematuria    Rx / DC Orders ED Discharge Orders         Ordered    sulfamethoxazole-trimethoprim (BACTRIM DS) 800-160 MG tablet  2 times daily     12/28/19 2315           Isla Pence, MD 12/28/19 2316

## 2019-12-28 NOTE — Telephone Encounter (Signed)
Legs with muscle pain and more pain than weakness.Advised pt to take Tylenol/Ibuprofen for leg aching. Advised to take temperature prior to taking OTC pain relievers. Pt having no weakness. Pt advised to go to [facility] to r/o kidney infection or kidney stone.

## 2019-12-29 ENCOUNTER — Encounter (INDEPENDENT_AMBULATORY_CARE_PROVIDER_SITE_OTHER): Payer: Self-pay

## 2019-12-30 ENCOUNTER — Encounter (INDEPENDENT_AMBULATORY_CARE_PROVIDER_SITE_OTHER): Payer: Self-pay

## 2019-12-31 ENCOUNTER — Encounter (INDEPENDENT_AMBULATORY_CARE_PROVIDER_SITE_OTHER): Payer: Self-pay

## 2020-01-01 ENCOUNTER — Other Ambulatory Visit: Payer: Self-pay

## 2020-01-01 ENCOUNTER — Encounter (INDEPENDENT_AMBULATORY_CARE_PROVIDER_SITE_OTHER): Payer: Self-pay

## 2020-01-01 ENCOUNTER — Telehealth: Payer: Self-pay | Admitting: *Deleted

## 2020-01-01 ENCOUNTER — Telehealth: Payer: Self-pay

## 2020-01-01 ENCOUNTER — Ambulatory Visit (INDEPENDENT_AMBULATORY_CARE_PROVIDER_SITE_OTHER): Payer: BC Managed Care – PPO | Admitting: Internal Medicine

## 2020-01-01 DIAGNOSIS — U071 COVID-19: Secondary | ICD-10-CM | POA: Diagnosis not present

## 2020-01-01 NOTE — Progress Notes (Signed)
   CC: COVID-19  This is a telephone encounter between [name] and [employer] on 01/01/2020 for COVID-19. The visit was conducted with the patient located at home and [facility] at [facility]. The patient's identity was confirmed using their DOB and current address. The patient has consented to being evaluated through a telephone encounter and understands the associated risks (an examination cannot be done and the patient may need to come in for an appointment) / benefits (allows the patient to remain at home, decreasing exposure to coronavirus). I personally spent 9 minutes on medical discussion.   HPI:  [name] is a 44 y.o. with PMH as below.   Please see A&P for assessment of the patient's acute and chronic medical conditions.   Past Medical History:  Diagnosis Date  . Anemia   . Angio-edema   . Asthma   . Basal cell carcinoma ~ 1999   collarbone  . Blood clot in vein ~ 2008   "left breast"   . Complication of anesthesia    "I have trouble waking up afterwards" (12/18/2017)  . Diarrhea 04/01/2019  . Eczema   . Family history of adverse reaction to anesthesia    "mom has trouble waking up afterwards" (12/18/2017)  . Fluttering heart    "comes and goes" (12/18/2017)  . GERD (gastroesophageal reflux disease)   . Headache    "weekly" (12/18/2017)  . History of kidney stones   . Recurrent upper respiratory infection (URI)   . 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 Torticollis   . Vertigo    Review of Systems:  Performed and all others negative.  Assessment & Plan:   See Encounters Tab for problem based charting.  Patient discussed with Dr. Evette Doffing

## 2020-01-01 NOTE — Telephone Encounter (Signed)
Patient states that she can barely sit up without been out of breath. Patient states that she has chest pain. Patient states that has been eating but has to force herself to do so. Patient will be following up with pcp today. Patient given protocol as follows:   If appetite becomes worse: encourage patient to drink fluids as tolerated, work their way up to bland solid food such as crackers, pretzels, soup, bread or applesauce and boiled starches.   If patient is unable to tolerate any foods or liquids, notify PCP.   IF PATIENT DEVELOPS SEVERE VOMITING (MORE THAN 6 TIMES A DAY AND OR >8 HOURS) AND/OR SEVERE ABDOMINAL PAIN ADVISE PATIENT TO CALL 911 AND SEEK TREATMENT IN ED   Shortness of breath is the same: continue to monitor at home   If symptoms become severe, i.e. shortness of breath at rest, gasping for air, wheezing, CALL 911 AND SEEK TREATMENT IN THE ED  If cough remains the same or better: continue to treat with over the counter medications. Hard candy or cough drops and drinking warm fluids. Adults can also use honey 2 tsp (10 ML) at bedtime.   HONEY IS NOT RECOMMENDED FOR INFANTS UNDER ONE.   If cough is becoming worse even with the use of over the counter medications and patient is not able to sleep at night, cough becomes productive with sputum that maybe yellow or green in color, contact PCP.  Patient verbalized understanding and will continue to monitor

## 2020-01-01 NOTE — Telephone Encounter (Signed)
Pt calls with lots of COVID questions, POSITIVE 1/6 She ask about "blood clots" stating she has history and she is worried about some of the pain she is having. [facility] appt for today at 1515

## 2020-01-01 NOTE — Assessment & Plan Note (Signed)
Patient diagnosed with COVID on 1/6. Symptoms started on 1/5. She continues to have fevers, myalgias, pleuritic chest pain and decreased appetite. She feels that her shortness of breath is worsening. She is now getting short of breath with minimal activity. She continues to use her rescue inhaler several times per day. She did not receive monoclonal antibody infusion after her last visit. Her chest pain is primarily worse at night when she is lying down. She is concerned about blood clots she has had a blood clot in her breast before.  A/P: - Given her severe shortness of breath and underlying asthma and recommended that she be evaluated in the emergency department.

## 2020-01-02 ENCOUNTER — Emergency Department (HOSPITAL_BASED_OUTPATIENT_CLINIC_OR_DEPARTMENT_OTHER)
Admission: EM | Admit: 2020-01-02 | Discharge: 2020-01-02 | Disposition: A | Discharge status: Home / Self Care | Payer: BC Managed Care – PPO | Attending: Emergency Medicine | Admitting: Emergency Medicine

## 2020-01-02 ENCOUNTER — Emergency Department (HOSPITAL_COMMUNITY): Payer: BC Managed Care – PPO

## 2020-01-02 ENCOUNTER — Other Ambulatory Visit: Payer: Self-pay

## 2020-01-02 ENCOUNTER — Emergency Department (HOSPITAL_BASED_OUTPATIENT_CLINIC_OR_DEPARTMENT_OTHER): Payer: BC Managed Care – PPO

## 2020-01-02 ENCOUNTER — Encounter (HOSPITAL_BASED_OUTPATIENT_CLINIC_OR_DEPARTMENT_OTHER): Payer: Self-pay | Admitting: *Deleted

## 2020-01-02 DIAGNOSIS — U071 COVID-19: Secondary | ICD-10-CM | POA: Insufficient documentation

## 2020-01-02 DIAGNOSIS — J45909 Unspecified asthma, uncomplicated: Secondary | ICD-10-CM | POA: Diagnosis not present

## 2020-01-02 DIAGNOSIS — J1289 Other viral pneumonia: Secondary | ICD-10-CM | POA: Diagnosis not present

## 2020-01-02 DIAGNOSIS — Z79899 Other long term (current) drug therapy: Secondary | ICD-10-CM | POA: Diagnosis not present

## 2020-01-02 DIAGNOSIS — J1282 Pneumonia due to coronavirus disease 2019: Secondary | ICD-10-CM | POA: Diagnosis not present

## 2020-01-02 DIAGNOSIS — R0789 Other chest pain: Secondary | ICD-10-CM | POA: Diagnosis not present

## 2020-01-02 DIAGNOSIS — R0602 Shortness of breath: Secondary | ICD-10-CM

## 2020-01-02 LAB — URINALYSIS, ROUTINE W REFLEX MICROSCOPIC
Bilirubin Urine: NEGATIVE
Glucose, UA: NEGATIVE mg/dL
Hgb urine dipstick: NEGATIVE
Ketones, ur: 15 mg/dL — AB
Leukocytes,Ua: NEGATIVE
Nitrite: NEGATIVE
Protein, ur: NEGATIVE mg/dL
Specific Gravity, Urine: 1.025 (ref 1.005–1.030)
pH: 6 (ref 5.0–8.0)

## 2020-01-02 LAB — BASIC METABOLIC PANEL
Anion gap: 10 (ref 5–15)
BUN: 13 mg/dL (ref 6–20)
CO2: 22 mmol/L (ref 22–32)
Calcium: 8.4 mg/dL — ABNORMAL LOW (ref 8.9–10.3)
Chloride: 103 mmol/L (ref 98–111)
Creatinine, Ser: 0.8 mg/dL (ref 0.44–1.00)
GFR calc Af Amer: >60 mL/min (ref >60)
GFR calc non Af Amer: >60 mL/min (ref >60)
Glucose, Bld: 99 mg/dL (ref 70–99)
Potassium: 3.8 mmol/L (ref 3.5–5.1)
Sodium: 135 mmol/L (ref 135–145)

## 2020-01-02 LAB — CBC WITH DIFFERENTIAL/PLATELET
Abs Immature Granulocytes: 0.02 10*3/uL (ref 0.00–0.07)
Basophils Absolute: 0 10*3/uL (ref 0.0–0.1)
Basophils Relative: 0 %
Eosinophils Absolute: 0 10*3/uL (ref 0.0–0.5)
Eosinophils Relative: 1 %
HCT: 42.7 % (ref 36.0–46.0)
Hemoglobin: 13.7 g/dL (ref 12.0–15.0)
Immature Granulocytes: 0 %
Lymphocytes Relative: 31 %
Lymphs Abs: 1.4 10*3/uL (ref 0.7–4.0)
MCH: 28.4 pg (ref 26.0–34.0)
MCHC: 32.1 g/dL (ref 30.0–36.0)
MCV: 88.6 fL (ref 80.0–100.0)
Monocytes Absolute: 0.4 10*3/uL (ref 0.1–1.0)
Monocytes Relative: 8 %
Neutro Abs: 2.8 10*3/uL (ref 1.7–7.7)
Neutrophils Relative %: 60 %
Platelets: 177 10*3/uL (ref 150–400)
RBC: 4.82 MIL/uL (ref 3.87–5.11)
RDW: 11.9 % (ref 11.5–15.5)
WBC: 4.7 10*3/uL (ref 4.0–10.5)
nRBC: 0 % (ref 0.0–0.2)

## 2020-01-02 LAB — PREGNANCY, URINE: Preg Test, Ur: NEGATIVE

## 2020-01-02 LAB — TROPONIN I (HIGH SENSITIVITY): Troponin I (High Sensitivity): 2 ng/L (ref <18)

## 2020-01-02 MED ORDER — SODIUM CHLORIDE 0.9 % IV BOLUS
1000.0000 mL | Freq: Once | INTRAVENOUS | Status: AC
  Administered 2020-01-02: 22:00:00 1000 mL via INTRAVENOUS

## 2020-01-02 MED ORDER — DEXAMETHASONE SODIUM PHOSPHATE 10 MG/ML IJ SOLN
10.0000 mg | Freq: Once | INTRAMUSCULAR | Status: AC
  Administered 2020-01-02: 10 mg via INTRAVENOUS
  Filled 2020-01-02: qty 1

## 2020-01-02 NOTE — ED Provider Notes (Addendum)
[facility] Provider Note   CSN: [id number] Arrival date & time: 01/02/20  2113     History Chief Complaint  Patient presents with  . Shortness of Breath    Erin Collins is a 44 y.o. female past medical history of anemia, asthma, basal cell carcinoma diarrhea who is known Covid positive who presents for evaluation of shortness of breath, chest pressure, achiness, soreness around her back, cough.  She reports that about 9 days ago, she started having symptoms.  She was tested and was found to be positive out 7 days ago.  She reports that since then, she has had ongoing soreness in her back, pains in her sides and as well as chest pressure and shortness of breath.  She was seen in the ED on 12/28/2019 for evaluation of symptoms.  At that time, he was found to have UTI and started on Bactrim.  She was seen through a telehealth visit yesterday for evaluation of symptoms.  Patient reports that she continues to feel bad, prompting ED visit tonight.  She reports that she feels a chest pressure in the center of her chest.  Patient states that she has continued to feel achy.  She reports that she will take Tylenol and feels better for a short while and then continues to have symptoms.  She reports that she has a productive cough that is productive of dark phlegm.  She also reports she is continued to have fevers.  She has not had any appetite but denies any nausea/vomiting, abdominal pain.  Patient reports history of asthma and states she has been using her inhalers with minimal improvement. She denies any OCP use, recent immobilization, prior history of DVT/PE, recent surgery, leg swelling, or long travel.  The history is provided by the patient.       Past Medical History:  Diagnosis Date  . Anemia   . Angio-edema   . Asthma   . Basal cell carcinoma ~ 1999   collarbone  . Blood clot in vein ~ 2008   "left breast"   . Complication of anesthesia    "I have trouble  waking up afterwards" (12/18/2017)  . Diarrhea 04/01/2019  . Eczema   . Family history of adverse reaction to anesthesia    "mom has trouble waking up afterwards" (12/18/2017)  . Fluttering heart    "comes and goes" (12/18/2017)  . GERD (gastroesophageal reflux disease)   . Headache    "weekly" (12/18/2017)  . History of kidney stones   . Recurrent upper respiratory infection (URI)   . 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 Torticollis   . Vertigo     Patient Active Problem List   Diagnosis Date Noted  . COVID-19 12/27/2019  . Heartburn 11/06/2019  . Trapezius muscle strain 09/20/2019  . Exposure to blood 02/05/2019  . Seasonal and perennial allergic rhinoconjunctivitis 12/31/2018  . Other atopic dermatitis 11/28/2018  . Multiple drug allergies 11/28/2018  . Healthcare maintenance 05/06/2018  . Moderate persistent asthma without complication [id number]    Past Surgical History:  Procedure Laterality Date  . BASAL CELL CARCINOMA EXCISION Right ~ 1999   "collarbone"  . DILATION AND CURETTAGE OF UTERUS  2005  . LAPAROSCOPIC ENDOMETRIOSIS FULGURATION  2005  . NASAL SEPTOPLASTY W/ TURBINOPLASTY Bilateral 2014  . SINOSCOPY    . TONSILLECTOMY  1980  . TUBAL LIGATION Right 2005  . TYMPANOSTOMY TUBE PLACEMENT       OB History  No obstetric history on file.     Family History  Problem Relation Age of Onset  . Allergic rhinitis Mother   . Asthma Mother     Social History   Tobacco Use  . Smoking status: Never Smoker  . Smokeless tobacco: Never Used  Substance Use Topics  . Alcohol use: Yes    Comment: 12/18/2017 "maybe 1/year on my birthday"  . Drug use: No    Home Medications Prior to Admission medications   Medication Sig Start Date End Date Taking? Authorizing Provider  albuterol (ACCUNEB) 0.63 MG/3ML nebulizer solution Take 6 mLs (1.26 mg total) by nebulization every 4 (four) hours as needed for wheezing or shortness of breath. 12/18/17   Mabe, Forbes Cellar, MD  albuterol (VENTOLIN HFA) 108 (90 Base) MCG/ACT inhaler Inhale 1-2 puffs into the lungs every 6 (six) hours as needed for wheezing or shortness of breath. IM program, Ventolin if ok through [facility] 12/24/19   Maudie Mercury, MD  azelastine (ASTELIN) 0.1 % nasal spray Take 1-2 sprays in each nostril 1-2 times a day as needed for nasal drainage. 05/23/19   Garnet Sierras, DO  Carbinoxamine Maleate (RYVENT) 6 MG TABS Take 6 mg by mouth every 12 (twelve) hours as needed. 09/09/19   Garnet Sierras, DO  Carbinoxamine Maleate 4 MG TABS Take 1-2 tablets (4-8 mg total) by mouth 2 (two) times daily. 11/06/19   Garnet Sierras, DO  famotidine (PEPCID) 40 MG tablet Take 1/2 tablet twice a day. 09/09/19   Garnet Sierras, DO  fluticasone furoate-vilanterol (BREO ELLIPTA) 200-25 MCG/INH AEPB Inhale 1 puff into the lungs daily. 12/25/19   Garnet Sierras, DO  Guaifenesin 1200 MG TB12 Take 1 tablet (1,200 mg total) by mouth 2 (two) times daily. 12/18/17   Lawyer, Harrell Gave, PA-C  meloxicam (MOBIC) 15 MG tablet Take 1 tablet (15 mg total) by mouth daily. 09/20/19   Neva Seat, MD  montelukast (SINGULAIR) 10 MG tablet Take 1 tablet (10 mg total) by mouth at bedtime. IM program 05/23/19   Garnet Sierras, DO  sulfamethoxazole-trimethoprim (BACTRIM DS) 800-160 MG tablet Take 1 tablet by mouth 2 (two) times daily for 7 days. 12/28/19 01/04/20  Isla Pence, MD  Tiotropium Bromide Monohydrate (SPIRIVA RESPIMAT) 1.25 MCG/ACT AERS Inhale 2 puffs into the lungs daily. 12/25/19   Garnet Sierras, DO  triamcinolone ointment (KENALOG) 0.1 % for the hands. May use twice a day as needed. Do not use on the face, neck, armpits or groin area. Do not use more than 3 weeks in a row. 11/06/19   Garnet Sierras, DO    Allergies    Amoxicillin, Cefuroxime axetil, Hydrocodone, Levofloxacin, Morphine, Oxycodone-acetaminophen, Penicillins, and Propoxyphene  Review of Systems   Review of Systems  Constitutional: Positive for activity change, appetite change and  fatigue.  Gastrointestinal: Positive for nausea.  Genitourinary: Negative for dysuria and hematuria.  All other systems reviewed and are negative.   Physical Exam Updated Vital Signs BP 120/81   Pulse 83   Temp 97.9 F (36.6 C)   Resp (!) 28   Ht 5\' 4"  (1.626 m)   Wt 83.9 kg   LMP 01/02/2020   SpO2 99%   BMI 31.76 kg/m   Physical Exam Vitals and nursing note reviewed.  Constitutional:      Appearance: Normal appearance. She is well-developed.  HENT:     Head: Normocephalic and atraumatic.  Eyes:     General: Lids are normal.  Conjunctiva/sclera: Conjunctivae normal.     Pupils: Pupils are equal, round, and reactive to light.  Cardiovascular:     Rate and Rhythm: Normal rate and regular rhythm.     Pulses: Normal pulses.     Heart sounds: Normal heart sounds. No murmur. No friction rub. No gallop.   Pulmonary:     Effort: Pulmonary effort is normal.     Breath sounds: Normal breath sounds.     Comments: Lungs clear to auscultation bilaterally.  Symmetric chest rise.  No wheezing, rales, rhonchi. No evidence of respiratory distress.  Abdominal:     Palpations: Abdomen is soft. Abdomen is not rigid.     Tenderness: There is no abdominal tenderness. There is no guarding.     Comments: Abdomen is soft, non-distended, non-tender. No rigidity, No guarding. No peritoneal signs.  Musculoskeletal:        General: Normal range of motion.     Cervical back: Full passive range of motion without pain.  Skin:    General: Skin is warm and dry.     Capillary Refill: Capillary refill takes less than 2 seconds.  Neurological:     Mental Status: She is alert and oriented to person, place, and time.  Psychiatric:        Speech: Speech normal.     ED Results / Procedures / Treatments   Labs (all labs ordered are listed, but only abnormal results are displayed) Labs Reviewed  BASIC METABOLIC PANEL - Abnormal; Notable for the following components:      Result Value   Calcium  8.4 (*)    All other components within normal limits  URINALYSIS, ROUTINE W REFLEX MICROSCOPIC - Abnormal; Notable for the following components:   Ketones, ur 15 (*)    All other components within normal limits  CBC WITH DIFFERENTIAL/PLATELET  PREGNANCY, URINE  TROPONIN I (HIGH SENSITIVITY)    EKG None  Radiology DG Chest Portable 1 View  Result Date: 01/02/2020 CLINICAL DATA:  COVID positive.  Shortness of breath EXAM: PORTABLE CHEST 1 VIEW COMPARISON:  12/28/2019 FINDINGS: Possible early patchy peripheral infiltrates in the left mid lung. Right lung clear. Heart is normal size. No effusions or acute bony abnormality. IMPRESSION: Possible early patchy peripheral infiltrates in the left mid lung. Electronically Signed   By: Rolm Baptise M.D.   On: 01/02/2020 21:55    Procedures Procedures (including critical care time)  Medications Ordered in ED Medications  dexamethasone (DECADRON) injection 10 mg (10 mg Intravenous Given 01/02/20 2211)  sodium chloride 0.9 % bolus 1,000 mL (1,000 mLs Intravenous New Bag/Given 01/02/20 2210)    ED Course  I have reviewed the triage vital signs and the nursing notes.  Pertinent labs & imaging results that were available during my care of the patient were reviewed by me and considered in my medical decision making (see chart for details).    MDM Rules/Calculators/A&P                      44 year old female who was recently diagnosed with COVID-19 presents for evaluation of shortness of breath, chest pressure.  She also reports continued fever, body aches, soreness, decreased appetite, fatigue.  On initially arrival, she is afebrile, nontoxic-appearing.  No evidence of hypoxia.  Lungs clear to auscultation.  No evidence of respiratory distress.  I discussed with length outpatient regarding her symptoms.  At this time, I feel this is consistent with continued COVID-19.  She has no hypoxia or  tachycardia.  No other PE risk factors.  Doubt PE.  I  suspect is all Covid related.  I did discuss with patient's that since this is her technically third visit given that she had a telehealth visit yesterday, I did offer to do blood work.  Patient does want blood work for reassurance.  We will plan to check basic labs, chest x-ray, EKG, troponin.  BMP is unremarkable.  CBC shows no leukocytosis.  Troponin is negative.  Chest x-ray shows possible early patchy peripheral infiltrates in the left midlung. Urine is negative for any infectious etiology. Urine pregnancy is negative.   Patient walked back to the room while maintaining 99% oxygen.  Her oxygen levels have been stable throughout the emergency department and been maintaining between 97-100.  No evidence of hypoxia or tachycardia.  I suspect this is likely related to continuing COVID-19 infection.  At this time, her work-up is not concerning for ACS etiology.   Discussed results with patient.  Encouraged at home supportive care measures.  I did instruct her to continue taking the Bactrim as it seems to have cleared her urine. At this time, patient exhibits no emergent life-threatening condition that require further evaluation in ED or admission. Patient had ample opportunity for questions and discussion. All patient's questions were answered with full understanding. Strict return precautions discussed. Patient expresses understanding and agreement to plan.   Erin Collins was evaluated in Emergency Department on 01/02/2020 for the symptoms described in the history of present illness. She was evaluated in the context of the global COVID-19 pandemic, which necessitated consideration that the patient might be at risk for infection with the SARS-CoV-2 virus that causes COVID-19. Institutional protocols and algorithms that pertain to the evaluation of patients at risk for COVID-19 are in a state of rapid change based on information released by regulatory bodies including the CDC and federal and state organizations.  These policies and algorithms were followed during the patient's care in the ED.  Portions of this note were generated with [employer]. Dictation errors may occur despite best attempts at proofreading.   Final Clinical Impression(s) / ED Diagnoses Final diagnoses:  COVID-19  Shortness of breath  Chest pressure  Pneumonia due to COVID-19 virus    Rx / DC Orders ED Discharge Orders    None       Volanda Napoleon, PA-C 01/02/20 2314    Volanda Napoleon, PA-C 01/02/20 2322    Margette Fast, MD 01/05/20 1952

## 2020-01-02 NOTE — ED Triage Notes (Signed)
Pt c/o back pain and SOB , covid positive , seen here sat for same

## 2020-01-02 NOTE — Progress Notes (Signed)
Internal Medicine Clinic Attending  Case discussed with Dr. Tarri Abernethy  at the time of the visit.  We reviewed the resident's history and pertinent patient test results.  I agree with the assessment, diagnosis, and plan of care documented in the resident's note.

## 2020-01-02 NOTE — Discharge Instructions (Addendum)
As we discussed today, your work-up was reassuring.  Your chest x-ray showed a possible early pneumonia.  Continue to make sure you are staying hydrated drinking plenty of fluids.  You can take Tylenol or Ibuprofen as directed for pain. You can alternate Tylenol and Ibuprofen every 4 hours. If you take Tylenol at 1pm, then you can take Ibuprofen at 5pm. Then you can take Tylenol again at 9pm.   Finish Bactrim.  Return the emergency department for any chest pain, difficulty breathing, vomiting or any other worsening or concerning symptoms.

## 2020-01-02 NOTE — ED Notes (Signed)
Pt remained on room air the entire time.

## 2020-01-02 NOTE — ED Notes (Signed)
Pt ambulatory back from lobby to room 9 without difficulty. Pulse ox 99% on arrival to room on room air. No tachypnea noted.

## 2020-01-03 ENCOUNTER — Encounter (INDEPENDENT_AMBULATORY_CARE_PROVIDER_SITE_OTHER): Payer: Self-pay

## 2020-01-04 ENCOUNTER — Encounter (INDEPENDENT_AMBULATORY_CARE_PROVIDER_SITE_OTHER): Payer: Self-pay

## 2020-01-04 ENCOUNTER — Telehealth: Payer: Self-pay | Admitting: Radiation Oncology

## 2020-01-04 NOTE — Telephone Encounter (Signed)
Patient called regarding worsening light headedness followed by nosebleed in setting of COVID-19 infection. Advised patient to go to Urgent Care for evaluation.

## 2020-01-05 ENCOUNTER — Encounter (INDEPENDENT_AMBULATORY_CARE_PROVIDER_SITE_OTHER): Payer: Self-pay

## 2020-01-06 ENCOUNTER — Encounter (INDEPENDENT_AMBULATORY_CARE_PROVIDER_SITE_OTHER): Payer: Self-pay

## 2020-01-15 ENCOUNTER — Encounter: Payer: Self-pay | Admitting: Internal Medicine

## 2020-01-15 ENCOUNTER — Other Ambulatory Visit: Payer: Self-pay

## 2020-01-15 ENCOUNTER — Ambulatory Visit (INDEPENDENT_AMBULATORY_CARE_PROVIDER_SITE_OTHER): Payer: BC Managed Care – PPO | Admitting: Internal Medicine

## 2020-01-15 DIAGNOSIS — U071 COVID-19: Secondary | ICD-10-CM

## 2020-01-15 DIAGNOSIS — J069 Acute upper respiratory infection, unspecified: Secondary | ICD-10-CM

## 2020-01-15 NOTE — Progress Notes (Signed)
I connected with  Erin Collins on 01/15/20 by a video enabled telemedicine application and verified that I am speaking with the correct person using two identifiers.   I discussed the limitations of evaluation and management by telemedicine. The patient expressed understanding and agreed to proceed.    CC: Fatigue and UR symptoms  This is a telephone encounter between Erin Collins and Erin Collins on 01/15/2020 for upper respiratory symptoms. The visit was conducted with the patient located at home and Erin Collins at [facility]. The patient's identity was confirmed using their DOB and current address. The patient has consented to being evaluated through a telephone encounter and understands the associated risks (an examination cannot be done and the patient may need to come in for an appointment) / benefits (allows the patient to remain at home, decreasing exposure to coronavirus). I personally spent 16 minutes on medical discussion.    HPI:  [name] is a 44 y.o. with PMH as below.   Please see A&P for assessment of the patient's acute and chronic medical conditions.   Past Medical History:  Diagnosis Date  . Anemia   . Angio-edema   . Asthma   . Basal cell carcinoma ~ 1999   collarbone  . Blood clot in vein ~ 2008   "left breast"   . Complication of anesthesia    "I have trouble waking up afterwards" (12/18/2017)  . Diarrhea 04/01/2019  . Eczema   . Family history of adverse reaction to anesthesia    "mom has trouble waking up afterwards" (12/18/2017)  . Fluttering heart    "comes and goes" (12/18/2017)  . GERD (gastroesophageal reflux disease)   . Headache    "weekly" (12/18/2017)  . History of kidney stones   . Recurrent upper respiratory infection (URI)   . 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 Torticollis   . Vertigo    Review of Systems:  Review of Systems  Constitutional: Positive for malaise/fatigue. Negative for chills, diaphoresis and fever.  Respiratory:  Positive for cough (dry) and shortness of breath (mild).   Cardiovascular: Negative for chest pain and leg swelling.  Genitourinary: Negative for dysuria.  Musculoskeletal: Negative for joint pain and myalgias.  Neurological: Positive for dizziness and headaches. Negative for focal weakness and weakness.       Assessment & Plan:   See Encounters Tab for problem based charting.  Patient discussed with Dr. Dareen Piano

## 2020-01-15 NOTE — Assessment & Plan Note (Addendum)
Patient presents for telehealth visit with a recent diagnosis of COVID-19 URI at the beginning of January. She states that the majority of her symptoms have resolved, but she continues to have a nonproductive cough with shortness of breath and fatigue. She does have sinus congestion and headache. I counseled her regarding her regarding the timeline of COVID 19 infections and reassured her that she is improving. I discussed important s/sx to look out for regarding post viral pneumonia and told her to call us immediately if she symptoms don't improve or worsen.    Plan: - Nettie pot and nasal steroid for congestion - Tylenol for pain - OTC cold and flu medication as needed. ] - Get plenty of rest, fluids , and nutrition

## 2020-01-16 NOTE — Progress Notes (Signed)
Internal Medicine Clinic Attending  Case discussed with Dr. Coe at the time of the visit.  We reviewed the resident's history and exam and pertinent patient test results.  I agree with the assessment, diagnosis, and plan of care documented in the resident's note.    

## 2020-01-30 DIAGNOSIS — H524 Presbyopia: Secondary | ICD-10-CM | POA: Diagnosis not present

## 2020-02-03 ENCOUNTER — Ambulatory Visit: Payer: BC Managed Care – PPO | Admitting: Internal Medicine

## 2020-02-03 ENCOUNTER — Other Ambulatory Visit: Payer: Self-pay

## 2020-02-03 ENCOUNTER — Encounter: Payer: Self-pay | Admitting: Internal Medicine

## 2020-02-03 VITALS — BP 120/76 | HR 78 | Temp 98.2°F | Ht 64.0 in | Wt 186.6 lb

## 2020-02-03 DIAGNOSIS — Z7951 Long term (current) use of inhaled steroids: Secondary | ICD-10-CM

## 2020-02-03 DIAGNOSIS — Z09 Encounter for follow-up examination after completed treatment for conditions other than malignant neoplasm: Secondary | ICD-10-CM | POA: Diagnosis not present

## 2020-02-03 DIAGNOSIS — J4540 Moderate persistent asthma, uncomplicated: Secondary | ICD-10-CM | POA: Diagnosis not present

## 2020-02-03 DIAGNOSIS — U071 COVID-19: Secondary | ICD-10-CM

## 2020-02-03 DIAGNOSIS — M545 Low back pain: Secondary | ICD-10-CM

## 2020-02-03 DIAGNOSIS — Z8616 Personal history of COVID-19: Secondary | ICD-10-CM | POA: Diagnosis not present

## 2020-02-03 DIAGNOSIS — M816 Localized osteoporosis [Lequesne]: Secondary | ICD-10-CM

## 2020-02-03 DIAGNOSIS — Z87828 Personal history of other (healed) physical injury and trauma: Secondary | ICD-10-CM

## 2020-02-03 DIAGNOSIS — Z0000 Encounter for general adult medical examination without abnormal findings: Secondary | ICD-10-CM

## 2020-02-03 DIAGNOSIS — Z79899 Other long term (current) drug therapy: Secondary | ICD-10-CM

## 2020-02-03 NOTE — Patient Instructions (Addendum)
To Ms. Erin Collins,  It was a pleasure meeting you today! Today we discussed your COVID 19 symptoms as well as your tailbone pain. Your tailbone pain may be related to your osteoporosis and we will order a DEXA Scan while obtaining your previous records. We will also order a referral to an OBGYN. Please follow up within three months. Have a good day!  Sincerely,  Maudie Mercury, MD

## 2020-02-03 NOTE — Progress Notes (Signed)
CC: COVID 19 Follow Up  HPI:  [name] is a 44 y.o. with a PMH listed below, who presents to the clinic with complaints of a follow up appointment due to her COVID 19 infection and Tailbone pain. To see the management of her acute and chronic issues see the separate assessment and plan note under encounters tab.   Past Medical History:  Diagnosis Date  . Anemia   . Angio-edema   . Asthma   . Basal cell carcinoma ~ 1999   collarbone  . Blood clot in vein ~ 2008   "left breast"   . Complication of anesthesia    "I have trouble waking up afterwards" (12/18/2017)  . Diarrhea 04/01/2019  . Eczema   . Family history of adverse reaction to anesthesia    "mom has trouble waking up afterwards" (12/18/2017)  . Fluttering heart    "comes and goes" (12/18/2017)  . GERD (gastroesophageal reflux disease)   . Headache    "weekly" (12/18/2017)  . History of kidney stones   . Recurrent upper respiratory infection (URI)   . 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 Torticollis   . Vertigo    Review of Systems:   Review of Systems  Constitutional: Positive for malaise/fatigue. Negative for chills, fever and weight loss.  Respiratory: Positive for shortness of breath. Negative for wheezing.   Cardiovascular: Negative for chest pain and palpitations.  Gastrointestinal: Negative for abdominal pain, constipation, diarrhea, nausea and vomiting.  Musculoskeletal: Positive for back pain. Negative for falls, joint pain, myalgias and neck pain.  Neurological: Negative for weakness and headaches.  Psychiatric/Behavioral: Negative for depression. The patient is not nervous/anxious.     Physical Exam: Vitals:   02/03/20 1326  BP: 120/76  Pulse: 78  Temp: 98.2 F (36.8 C)  TempSrc: Oral  SpO2: 95%  Weight: 186 lb 9.6 oz (84.6 kg)  Height: 5\' 4"  (1.626 m)    Physical Exam Constitutional:      General: She is not in acute distress.    Appearance: Normal appearance. She is not  ill-appearing, toxic-appearing or diaphoretic.  HENT:     Head: Normocephalic and atraumatic.  Cardiovascular:     Rate and Rhythm: Normal rate and regular rhythm.     Pulses: Normal pulses.     Heart sounds: Normal heart sounds. No murmur. No friction rub. No gallop.   Pulmonary:     Effort: Pulmonary effort is normal.     Breath sounds: Normal breath sounds. No wheezing, rhonchi or rales.  Chest:     Chest wall: No tenderness.  Abdominal:     General: Abdomen is flat. Bowel sounds are normal.     Palpations: Abdomen is soft.     Tenderness: There is no abdominal tenderness. There is no guarding.  Musculoskeletal:        General: Tenderness present. No swelling, deformity or signs of injury. Normal range of motion.     Comments: Tenderness produced on palpation of the L4-L5 spine.  Skin:    General: Skin is warm and dry.     Findings: No bruising, erythema, lesion or rash.  Neurological:     General: No focal deficit present.     Mental Status: She is alert and oriented to person, place, and time.  Psychiatric:        Mood and Affect: Mood normal.        Behavior: Behavior normal.    Assessment & Plan:  See Encounters Tab for problem based charting.  Patient discussed with Dr. Philipp Ovens

## 2020-02-04 ENCOUNTER — Encounter: Payer: Self-pay | Admitting: Internal Medicine

## 2020-02-04 DIAGNOSIS — M810 Age-related osteoporosis without current pathological fracture: Secondary | ICD-10-CM | POA: Insufficient documentation

## 2020-02-04 NOTE — Assessment & Plan Note (Signed)
Patient states that her asthma symptoms are well managed. She follows Dr. Scherrie Bateman in the allergy clinic. She states that she is doing well on her Breo, singulair, and albuterol inhaler.  Plan:  - Continue following with Allergy.

## 2020-02-04 NOTE — Assessment & Plan Note (Signed)
Patient requesting OB/GYN referral for Pap smear.  - Referral to OB/GYN

## 2020-02-04 NOTE — Assessment & Plan Note (Addendum)
Patient states that she has a history of osteoporosis since she was 51. She states that she has had multiple injuries to her lumbar spine when she was little and had DEXA scans at [facility] with Dr. Shelly Bombard. She states that she has osteoporosis of the L3-L5 and has been having recent pains in the lumbar region.  Plan:  - Request medical records - DEXA Scan - bisphosphonate therapy pending DEXA Scan and medical record retrieval.

## 2020-02-04 NOTE — Assessment & Plan Note (Signed)
Patient for follow up on her recent COVID 19 infection. Patient is out of the infectious window and quarantined at home during the time of her infection. Patient states that she is feeling much better than during her infection. She states that she still is short of breath on ambulation, but her SHOB is slowly declining. She denies coughing, wheezing, loss of smell/taste, or SHOB at rest.   P: - Spoke with patient continuing to monitor her symptoms.  - Spoke with patient about need to continue rest and hydration.

## 2020-02-05 NOTE — Progress Notes (Signed)
Internal Medicine Clinic Attending  Case discussed with Dr. Winters at the time of the visit.  We reviewed the resident's history and exam and pertinent patient test results.  I agree with the assessment, diagnosis, and plan of care documented in the resident's note.  

## 2020-02-10 ENCOUNTER — Ambulatory Visit: Payer: BC Managed Care – PPO | Admitting: Allergy

## 2020-02-10 ENCOUNTER — Encounter: Payer: Self-pay | Admitting: Allergy

## 2020-02-10 ENCOUNTER — Other Ambulatory Visit: Payer: Self-pay

## 2020-02-10 VITALS — BP 116/78 | HR 87 | Temp 97.3°F | Resp 16

## 2020-02-10 DIAGNOSIS — J302 Other seasonal allergic rhinitis: Secondary | ICD-10-CM

## 2020-02-10 DIAGNOSIS — J4540 Moderate persistent asthma, uncomplicated: Secondary | ICD-10-CM | POA: Diagnosis not present

## 2020-02-10 DIAGNOSIS — R12 Heartburn: Secondary | ICD-10-CM

## 2020-02-10 DIAGNOSIS — H1010 Acute atopic conjunctivitis, unspecified eye: Secondary | ICD-10-CM

## 2020-02-10 DIAGNOSIS — L2089 Other atopic dermatitis: Secondary | ICD-10-CM

## 2020-02-10 DIAGNOSIS — J3089 Other allergic rhinitis: Secondary | ICD-10-CM

## 2020-02-10 DIAGNOSIS — Z889 Allergy status to unspecified drugs, medicaments and biological substances status: Secondary | ICD-10-CM | POA: Diagnosis not present

## 2020-02-10 MED ORDER — ARNUITY ELLIPTA 100 MCG/ACT IN AEPB: 1.0000 | INHALATION_SPRAY | Freq: Every day | RESPIRATORY_TRACT | 5 refills | Status: DC

## 2020-02-10 NOTE — Patient Instructions (Addendum)
Moderate persistent asthma  Today's spirometry was normal.   Act score: 13  Daily controller medication(s): ? Start Arnuity 100 1 puff daily and rinse mouth afterwards for 2 months.  ? Continue Breo 200 1 puff daily and rinse mouth afterwards. ? Continue spiriva 2 puffs daily.   Continue Singulair 10mg  daily  Prior to physical activity:May use albuterol rescue inhaler 2 puffs 5 to 15 minutes prior to strenuous physical activities.  Rescue medications:May use albuterol rescue inhaler 2 puffs or nebulizer every 4 to 6 hours as needed for shortness of breath, chest tightness, coughing, and wheezing. Monitor frequency of use Asthma control goals:  Full participation in all desired activities (may need albuterol before activity) Albuterol use two times or less a week on average (not counting use with activity) Cough interfering with sleep two times or less a month Oral steroids no more than once a year No hospitalizations  Seasonal and perennial allergic rhinoconjunctivitis  May use carbinoxamine 4mg  tablets. May take 1 to 1.5 tablet twice a day.  Restart Nasacort 1 spray 2 times a day for nasal congestion as needed.   May use Azelastine nasal spray 2 sprays 2 times a day for postnasal drip as needed.   Nasal saline spray (i.e., Simply Saline) or nasal saline lavage (i.e., NeilMed) is recommended as needed and prior to medicated nasal sprays.  Stop zaditor eye drops.   Other atopic dermatitis  Continue proper skin care measures.  May use triamcinolone 0.1% ointment twice a day as needed for the hands. Do not use on the face, neck, armpits or groin area. Do not use more than 3 weeks in a row.   Multiple drug allergies Past history - reactions to amoxicillin, cefuroxime, hydrocodone, levofloxacin, morphine, oxycodone, and propoxyphene in the past.   Continue to avoid all these medications.   Heartburn  Continue Pepcid 1/2 tablet twice a day.  Follow up in 2 months or  sooner if needed.

## 2020-02-10 NOTE — Assessment & Plan Note (Signed)
Past history - Perennial rhinoconjunctivitis symptoms for the past 20 years with worsening in the spring and fall.  Patient has tried Zyrtec, Singulair, Nasacort, saline nasal spray and Zaditor eyedrops with some benefit.  Patient used to be on allergy immunotherapy for 10 years with good benefit. 2019 skin testing showed, positive to dust mites, cat, dog, grass and horse. Interim history - increased nasal congestion.    May use carbinoxamine 4mg  tablets. May take 1 to 1.5 tablet twice a day.  Restart Nasacort 1 spray 2 times a day for nasal congestion as needed.   May use Azelastine nasal spray 2 sprays 2 times a day for postnasal drip as needed.   Nasal saline spray (i.e., Simply Saline) or nasal saline lavage (i.e., NeilMed) is recommended as needed and prior to medicated nasal sprays.  Stop Zaditor eye drops for now due to dry eyes.

## 2020-02-10 NOTE — Progress Notes (Signed)
Follow Up Note  RE: Erin Collins MRN: [id number] DOB: 1976-11-08 Date of Office Visit: 02/10/2020  Referring provider: Maudie Mercury, MD Primary care provider: Maudie Mercury, MD  Chief Complaint: Follow-up  History of Present Illness: I had the pleasure of seeing Erin Collins for a follow up visit at the [facility] [place] [place] [facility] on 02/10/2020. She is a 44 y.o. female, who is being followed for asthma, allergic rhino conjunctivitis, atopic dermatitis, heartburn, multiple drug allergies. Her previous allergy office visit was on 11/06/2019 with Dr. Maudie Mercury. Today is a regular follow up visit.  Moderate persistent asthma without complication Patient had COVID in January and went to ER twice due to kidney infection and pneumonia. She did get IV steroids.  Still having issues with shortness of breath with minimal exertion, fatigue, brain fog, headaches, sore throat.  Some nasal congestion and taking mucinex with some benefit.   Symptoms slowly improving.  Currently on Spiriva 1.59mcg 2 puffs once a day and Breo 200 1 puff daily.  Taking Singulair daily. Did not tolerate trelegy.   Seasonal and perennial allergic rhinoconjunctivitis Currently on azelastine nasal spray and nasal saline. Not sure why she stopped Nasacort. Using carbinoxamine 4mg  BID.  Went to see eye doctor and was told she has dry eyes. Using Zaditor eye drops for itchy eyes.  Other atopic dermatitis Using Eucerin as needed with good benefit.   Multiple drug allergies No new drug reactions.   Heartburn Taking Pepcid 1/2 tablet twice a day with good benefit.   Assessment and Plan: Erin Collins is a 44 y.o. female with: Not well controlled moderate persistent asthma Past history - Diagnosed with asthma over 19 years ago. Patient moved in with her parents who have cats and dogs at home during this time. Did not tolerate Trelegy.  Interim history - was doing well up until January when she got COVID. Still having  shortness of breath with minimal exertion, fatigue, brain fog, headaches, sore throat, nasal congestion.   Today's spirometry was normal but slightly worse than previous.   Act score: 13  Daily controller medication(s): ? Start Arnuity 100 1 puff daily and rinse mouth afterwards for 2 months.  ? If no improvement in dyspnea on exertion, advised to follow up with PCP to check on her cardiac function.  ? Continue Breo 200 1 puff daily and rinse mouth afterwards. ? Continue Spiriva 1.72mcg 2 puffs daily.   Continue Singulair 10mg  daily  Prior to physical activity:May use albuterol rescue inhaler 2 puffs 5 to 15 minutes prior to strenuous physical activities.  Rescue medications:May use albuterol rescue inhaler 2 puffs or nebulizer every 4 to 6 hours as needed for shortness of breath, chest tightness, coughing, and wheezing. Monitor frequency of use.  Repeat spirometry at next visit.   Seasonal and perennial allergic rhinoconjunctivitis Past history - Perennial rhinoconjunctivitis symptoms for the past 20 years with worsening in the spring and fall.  Patient has tried Zyrtec, Singulair, Nasacort, saline nasal spray and Zaditor eyedrops with some benefit.  Patient used to be on allergy immunotherapy for 10 years with good benefit. 2019 skin testing showed, positive to dust mites, cat, dog, grass and horse. Interim history - increased nasal congestion.    May use carbinoxamine 4mg  tablets. May take 1 to 1.5 tablet twice a day.  Restart Nasacort 1 spray 2 times a day for nasal congestion as needed.   May use Azelastine nasal spray 2 sprays 2 times a day for postnasal drip as needed.  Nasal saline spray (i.e., Simply Saline) or nasal saline lavage (i.e., NeilMed) is recommended as needed and prior to medicated nasal sprays.  Stop Zaditor eye drops for now due to dry eyes.   Other atopic dermatitis Past history - Eucrisa not effective.  Interim history - some flares on hands.    Continue proper skin care measures.  May use triamcinolone 0.1% ointment twice a day as needed for the hands. Do not use on the face, neck, armpits or groin area. Do not use more than 3 weeks in a row.   Multiple drug allergies Past history - reactions to amoxicillin, cefuroxime, hydrocodone, levofloxacin, morphine, oxycodone, and propoxyphene in the past.   Continue to avoid all these medications.   Heartburn Stable.  Continue Pepcid 20mg  1/2 tablet twice a day.  Return in about 2 months (around 04/09/2020).  Meds ordered this encounter  Medications  . Fluticasone Furoate (ARNUITY ELLIPTA) 100 MCG/ACT AEPB    Sig: Inhale 1 puff into the lungs daily.    Dispense:  30 each    Refill:  5   Diagnostics: Spirometry:  Tracings reviewed. Her effort: Good reproducible efforts. FVC: 3.10L FEV1: 2.33L, 78% predicted FEV1/FVC ratio: 75% Interpretation: Spirometry consistent with normal pattern.  Please see scanned spirometry results for details.  Medication List:  Current Outpatient Medications  Medication Sig Dispense Refill  . albuterol (ACCUNEB) 0.63 MG/3ML nebulizer solution Take 6 mLs (1.26 mg total) by nebulization every 4 (four) hours as needed for wheezing or shortness of breath. 75 mL 0  . albuterol (VENTOLIN HFA) 108 (90 Base) MCG/ACT inhaler Inhale 1-2 puffs into the lungs every 6 (six) hours as needed for wheezing or shortness of breath. IM program, Ventolin if ok through 340B 8 g 10  . azelastine (ASTELIN) 0.1 % nasal spray Take 1-2 sprays in each nostril 1-2 times a day as needed for nasal drainage. 30 mL 5  . Carbinoxamine Maleate 4 MG TABS Take 1-2 tablets (4-8 mg total) by mouth 2 (two) times daily. 120 tablet 5  . famotidine (PEPCID) 40 MG tablet Take 1/2 tablet twice a day. 30 tablet 2  . fluticasone furoate-vilanterol (BREO ELLIPTA) 200-25 MCG/INH AEPB Inhale 1 puff into the lungs daily. 60 each 5  . Guaifenesin 1200 MG TB12 Take 1 tablet (1,200 mg total) by  mouth 2 (two) times daily. 20 each 0  . montelukast (SINGULAIR) 10 MG tablet Take 1 tablet (10 mg total) by mouth at bedtime. IM program 30 tablet 5  . Tiotropium Bromide Monohydrate (SPIRIVA RESPIMAT) 1.25 MCG/ACT AERS Inhale 2 puffs into the lungs daily. 4 g 5  . Fluticasone Furoate (ARNUITY ELLIPTA) 100 MCG/ACT AEPB Inhale 1 puff into the lungs daily. 30 each 5   No current facility-administered medications for this visit.   Allergies: Allergies  Allergen Reactions  . Amoxicillin Hives  . Cefuroxime Axetil Hives  . Hydrocodone Itching  . Levofloxacin Other (See Comments)    tendonitis  . Morphine Itching  . Oxycodone-Acetaminophen Itching  . Penicillins Hives    Has patient had a PCN reaction causing immediate rash, facial/tongue/throat swelling, SOB or lightheadedness with hypotension: Yes Has patient had a PCN reaction causing severe rash involving mucus membranes or skin necrosis: No Has patient had a PCN reaction that required hospitalization: No Has patient had a PCN reaction occurring within the last 10 years: No If all of the above answers are "NO", then may proceed with Cephalosporin use.   Marland Kitchen Propoxyphene Itching   I reviewed  her past medical history, social history, family history, and environmental history and no significant changes have been reported from her previous visit.  Review of Systems  Constitutional: Positive for fatigue. Negative for appetite change, chills, fever and unexpected weight change.  HENT: Positive for congestion and postnasal drip. Negative for rhinorrhea.   Eyes: Negative for itching.  Respiratory: Positive for cough and shortness of breath. Negative for chest tightness and wheezing.   Cardiovascular: Negative for chest pain.  Gastrointestinal: Negative for abdominal pain.  Genitourinary: Negative for difficulty urinating.  Skin: Negative for rash.  Allergic/Immunologic: Positive for environmental allergies. Negative for food allergies.   Neurological: Positive for headaches.   Objective: BP 116/78 (BP Location: Left Arm, Patient Position: Sitting, Cuff Size: Normal)   Pulse 87   Temp (!) 97.3 F (36.3 C) (Temporal)   Resp 16   SpO2 97%  There is no height or weight on file to calculate BMI. Physical Exam  Constitutional: She is oriented to person, place, and time. She appears well-developed and well-nourished.  HENT:  Head: Normocephalic and atraumatic.  Right Ear: External ear normal.  Left Ear: External ear normal.  Nose: Nose normal.  Mouth/Throat: Oropharynx is clear and moist.  Eyes: Conjunctivae and EOM are normal.  Cardiovascular: Normal rate, regular rhythm and normal heart sounds. Exam reveals no gallop and no friction rub.  No murmur heard. Pulmonary/Chest: Effort normal and breath sounds normal. She has no wheezes. She has no rales.  Abdominal: Soft.  Musculoskeletal:     Cervical back: Neck supple.  Neurological: She is alert and oriented to person, place, and time.  Skin: Skin is warm. No rash noted.  Psychiatric: She has a normal mood and affect. Her behavior is normal.  Nursing note and vitals reviewed.  Previous notes and tests were reviewed. The plan was reviewed with the patient/family, and all questions/concerned were addressed.  It was my pleasure to see Skilar today and participate in her care. Please feel free to contact me with any questions or concerns.  Sincerely,  Rexene Alberts, DO Allergy & Immunology  [facility] office: [phone] [facility] office: [place] office: [phone]

## 2020-02-10 NOTE — Assessment & Plan Note (Signed)
Stable.  Continue Pepcid 20mg  1/2 tablet twice a day.

## 2020-02-10 NOTE — Assessment & Plan Note (Signed)
Past history - reactions to amoxicillin, cefuroxime, hydrocodone, levofloxacin, morphine, oxycodone, and propoxyphene in the past.   Continue to avoid all these medications.  

## 2020-02-10 NOTE — Assessment & Plan Note (Addendum)
Past history - Diagnosed with asthma over 19 years ago. Patient moved in with her parents who have cats and dogs at home during this time. Did not tolerate Trelegy.  Interim history - was doing well up until January when she got COVID. Still having shortness of breath with minimal exertion, fatigue, brain fog, headaches, sore throat, nasal congestion.   Today's spirometry was normal but slightly worse than previous.   Act score: 13  Daily controller medication(s): ? Start Arnuity 100 1 puff daily and rinse mouth afterwards for 2 months.  ? If no improvement in dyspnea on exertion, advised to follow up with PCP to check on her cardiac function.  ? Continue Breo 200 1 puff daily and rinse mouth afterwards. ? Continue Spiriva 1.63mcg 2 puffs daily.   Continue Singulair 10mg  daily  Prior to physical activity:May use albuterol rescue inhaler 2 puffs 5 to 15 minutes prior to strenuous physical activities.  Rescue medications:May use albuterol rescue inhaler 2 puffs or nebulizer every 4 to 6 hours as needed for shortness of breath, chest tightness, coughing, and wheezing. Monitor frequency of use.  Repeat spirometry at next visit.

## 2020-02-10 NOTE — Assessment & Plan Note (Signed)
Past history - Eucrisa not effective.  Interim history - some flares on hands.   Continue proper skin care measures.  May use triamcinolone 0.1% ointment twice a day as needed for the hands. Do not use on the face, neck, armpits or groin area. Do not use more than 3 weeks in a row.

## 2020-02-12 ENCOUNTER — Encounter: Payer: Self-pay | Admitting: Internal Medicine

## 2020-02-21 ENCOUNTER — Other Ambulatory Visit: Payer: Self-pay | Admitting: *Deleted

## 2020-02-21 ENCOUNTER — Other Ambulatory Visit: Payer: Self-pay | Admitting: Allergy

## 2020-03-03 ENCOUNTER — Telehealth: Payer: Self-pay

## 2020-03-03 NOTE — Telephone Encounter (Signed)
alled pt, reassured, she is scheduled this week for her vaccine at [facility]. She was advised to write down her questions that are direct about vaccine and ask when she checks in, she is agreeable.

## 2020-03-03 NOTE — Telephone Encounter (Signed)
Requesting to speak with a nurse about COVID vaccine, please call pt back.  °

## 2020-03-23 ENCOUNTER — Telehealth: Payer: Self-pay

## 2020-03-23 ENCOUNTER — Ambulatory Visit: Payer: BC Managed Care – PPO | Admitting: Internal Medicine

## 2020-03-23 ENCOUNTER — Encounter: Payer: Self-pay | Admitting: Internal Medicine

## 2020-03-23 VITALS — BP 129/82 | HR 84 | Temp 98.6°F | Wt 190.5 lb

## 2020-03-23 DIAGNOSIS — M79602 Pain in left arm: Secondary | ICD-10-CM | POA: Diagnosis not present

## 2020-03-23 DIAGNOSIS — Z86718 Personal history of other venous thrombosis and embolism: Secondary | ICD-10-CM | POA: Diagnosis not present

## 2020-03-23 DIAGNOSIS — Z9889 Other specified postprocedural states: Secondary | ICD-10-CM

## 2020-03-23 NOTE — Telephone Encounter (Signed)
Received a TC from patient, she c/o tenderness to LFA with slight amount of swelling.  Denies warmth or redness.  Pt states she donated plasma on Saturday and has noted the tenderness and swelling since that time.  Pt also states she has a hx of a blood clot and has been told she has "high markers for blood clots". RN advised pt to make an appt so MD can see her arm and evaluate in person, she is very agreeable to this.  Appt in [facility] made for today at 3:45. SChaplin, RN,BSN

## 2020-03-23 NOTE — Patient Instructions (Signed)
Erin Collins, It was a pleasure meeting you. Today we discussed your left arm pain after recent plasma donation. This is very unlikely to be a blood clot and more likely a localized skin reaction. You can try warm compresses or ice to the area as needed. If you notice any worsening swelling or pain, please give [place] a call. Otherwise you can monitor and plan to donate again the following Saturday.   Take care!

## 2020-03-23 NOTE — Progress Notes (Signed)
Acute Office Visit  Subjective:    Patient ID: Erin Collins, female    DOB: 01-29-1976, 44 y.o.   MRN: [id number]  Chief Complaint  Patient presents with  . Arm Pain    left lower    HPI Patient is in today for left arm pain that began after donating plasma 2 days ago. Please see problem based charting for further details.   Past Medical History:  Diagnosis Date  . Anemia   . Angio-edema   . Asthma   . Basal cell carcinoma ~ 1999   collarbone  . Blood clot in vein ~ 2008   "left breast"   . Complication of anesthesia    "I have trouble waking up afterwards" (12/18/2017)  . Diarrhea 04/01/2019  . Eczema   . Family history of adverse reaction to anesthesia    "mom has trouble waking up afterwards" (12/18/2017)  . Fluttering heart    "comes and goes" (12/18/2017)  . GERD (gastroesophageal reflux disease)   . Headache    "weekly" (12/18/2017)  . History of kidney stones   . Recurrent upper respiratory infection (URI)   . Seizures ([place]) 1980 X 1   "bland stare"  . Torticollis   . Vertigo     Past Surgical History:  Procedure Laterality Date  . BASAL CELL CARCINOMA EXCISION Right ~ 1999   "collarbone"  . DILATION AND CURETTAGE OF UTERUS  2005  . LAPAROSCOPIC ENDOMETRIOSIS FULGURATION  2005  . NASAL SEPTOPLASTY W/ TURBINOPLASTY Bilateral 2014  . SINOSCOPY    . TONSILLECTOMY  1980  . TUBAL LIGATION Right 2005  . TYMPANOSTOMY TUBE PLACEMENT      Family History  Problem Relation Age of Onset  . Allergic rhinitis Mother   . Asthma Mother     Social History   Socioeconomic History  . Marital status: Divorced    Spouse name: Not on file  . Number of children: Not on file  . Years of education: Not on file  . Highest education level: Not on file  Occupational History  . Not on file  Tobacco Use  . Smoking status: Never Smoker  . Smokeless tobacco: Never Used  Substance and Sexual Activity  . Alcohol use: Not Currently    Comment: 12/18/2017 "maybe 1/year  on my birthday"  . Drug use: No  . Sexual activity: Not Currently  Other Topics Concern  . Not on file  Social History Narrative  . Not on file   Social Determinants of Health   Financial Resource Strain:   . Difficulty of Paying Living Expenses:   Food Insecurity:   . Worried About Charity fundraiser in the Last Year:   . Arboriculturist in the Last Year:   Transportation Needs:   . Film/video editor (Medical):   Marland Kitchen Lack of Transportation (Non-Medical):   Physical Activity:   . Days of Exercise per Week:   . Minutes of Exercise per Session:   Stress:   . Feeling of Stress :   Social Connections:   . Frequency of Communication with Friends and Family:   . Frequency of Social Gatherings with Friends and Family:   . Attends Religious Services:   . Active Member of Clubs or Organizations:   . Attends Archivist Meetings:   Marland Kitchen Marital Status:   Intimate Partner Violence:   . Fear of Current or Ex-Partner:   . Emotionally Abused:   Marland Kitchen Physically Abused:   . Sexually  Abused:     Outpatient Medications Prior to Visit  Medication Sig Dispense Refill  . albuterol (ACCUNEB) 0.63 MG/3ML nebulizer solution Take 6 mLs (1.26 mg total) by nebulization every 4 (four) hours as needed for wheezing or shortness of breath. 75 mL 0  . albuterol (VENTOLIN HFA) 108 (90 Base) MCG/ACT inhaler Inhale 1-2 puffs into the lungs every 6 (six) hours as needed for wheezing or shortness of breath. IM program, Ventolin if ok through 340B 8 g 10  . azelastine (ASTELIN) 0.1 % nasal spray Take 1-2 sprays in each nostril 1-2 times a day as needed for nasal drainage. 30 mL 5  . Carbinoxamine Maleate 4 MG TABS Take 1-2 tablets (4-8 mg total) by mouth 2 (two) times daily. 120 tablet 5  . famotidine (PEPCID) 40 MG tablet Take 1/2 tablet twice a day. 30 tablet 2  . Fluticasone Furoate (ARNUITY ELLIPTA) 100 MCG/ACT AEPB Inhale 1 puff into the lungs daily. 30 each 5  . fluticasone furoate-vilanterol  (BREO ELLIPTA) 200-25 MCG/INH AEPB Inhale 1 puff into the lungs daily. 60 each 5  . Guaifenesin 1200 MG TB12 Take 1 tablet (1,200 mg total) by mouth 2 (two) times daily. 20 each 0  . montelukast (SINGULAIR) 10 MG tablet TAKE 1 TABLET BY MOUTH AT BEDTIME 30 tablet 0  . Tiotropium Bromide Monohydrate (SPIRIVA RESPIMAT) 1.25 MCG/ACT AERS Inhale 2 puffs into the lungs daily. 4 g 5   No facility-administered medications prior to visit.    Allergies  Allergen Reactions  . Amoxicillin Hives  . Cefuroxime Axetil Hives  . Hydrocodone Itching  . Levofloxacin Other (See Comments)    tendonitis  . Morphine Itching  . Oxycodone-Acetaminophen Itching  . Penicillins Hives    Has patient had a PCN reaction causing immediate rash, facial/tongue/throat swelling, SOB or lightheadedness with hypotension: Yes Has patient had a PCN reaction causing severe rash involving mucus membranes or skin necrosis: No Has patient had a PCN reaction that required hospitalization: No Has patient had a PCN reaction occurring within the last 10 years: No If all of the above answers are "NO", then may proceed with Cephalosporin use.   Marland Kitchen Propoxyphene Itching    Review of Systems Constitutional: negative for fevers, chills GI: negative for nausea, vomiting, Skin: negative for rash MSK: negative for joint swelling      Objective:    Physical Exam Constitutional:      General: She is not in acute distress.    Appearance: Normal appearance.  Cardiovascular:     Pulses: Normal pulses.  Musculoskeletal:     Comments: Left forearm below site where plasma exchange was done with mild hyperesthesia and appears slightly larger than right forearm. No erythema, local effusion, or rash.   Skin:    General: Skin is warm and dry.     Findings: No erythema or rash.  Neurological:     Mental Status: She is alert.  Psychiatric:        Mood and Affect: Mood normal.        Behavior: Behavior normal.     BP 129/82 (BP  Location: Left Arm, Patient Position: Sitting, Cuff Size: Large)   Pulse 84   Temp 98.6 F (37 C) (Oral)   Wt 190 lb 8 oz (86.4 kg)   LMP 03/10/2020   SpO2 97% Comment: room air  BMI 32.70 kg/m  Wt Readings from Last 3 Encounters:  03/23/20 190 lb 8 oz (86.4 kg)  02/03/20 186 lb 9.6 oz (84.6  kg)  01/02/20 185 lb (83.9 kg)    Health Maintenance Due  Topic Date Due  . PAP SMEAR-Modifier  Never done    There are no preventive care reminders to display for this patient.   No results found for: TSH Lab Results  Component Value Date   WBC 4.7 01/02/2020   HGB 13.7 01/02/2020   HCT 42.7 01/02/2020   MCV 88.6 01/02/2020   PLT 177 01/02/2020   Lab Results  Component Value Date   NA 135 01/02/2020   K 3.8 01/02/2020   CO2 22 01/02/2020   GLUCOSE 99 01/02/2020   BUN 13 01/02/2020   CREATININE 0.80 01/02/2020   CALCIUM 8.4 (L) 01/02/2020   ANIONGAP 10 01/02/2020   No results found for: CHOL No results found for: HDL No results found for: LDLCALC No results found for: TRIG No results found for: CHOLHDL No results found for: HGBA1C     Assessment & Plan:   Problem List Items Addressed This Visit      Other   Arm pain, anterior, left - Primary    Patient presents with left forearm pain after donating plasma 2 days ago. She describes the area as slightly painful with skin hypersensitivity. No systemic signs of infection and site where they accessed does not appear infected. She has a remote history of a DVT and wanted to ensure that she did not have one in her left arm.  I reassured her that she likely has a localized skin reaction from the plasma donation and can apply ice packs or warm compresses. Instructed to call back if she develops worsening pain or swelling.           No orders of the defined types were placed in this encounter.    Delice Bison, DO

## 2020-03-24 ENCOUNTER — Encounter: Payer: Self-pay | Admitting: Internal Medicine

## 2020-03-24 DIAGNOSIS — M79602 Pain in left arm: Secondary | ICD-10-CM | POA: Insufficient documentation

## 2020-03-24 NOTE — Assessment & Plan Note (Signed)
Patient presents with left forearm pain after donating plasma 2 days ago. She describes the area as slightly painful with skin hypersensitivity. No systemic signs of infection and site where they accessed does not appear infected. She has a remote history of a DVT and wanted to ensure that she did not have one in her left arm.  I reassured her that she likely has a localized skin reaction from the plasma donation and can apply ice packs or warm compresses. Instructed to call back if she develops worsening pain or swelling.

## 2020-03-25 NOTE — Progress Notes (Signed)
Internal Medicine Clinic Attending  Case discussed with Dr. Bloomfield at the time of the visit.  We reviewed the resident's history and exam and pertinent patient test results.  I agree with the assessment, diagnosis, and plan of care documented in the resident's note.  

## 2020-03-30 ENCOUNTER — Telehealth: Payer: Self-pay | Admitting: Internal Medicine

## 2020-03-30 NOTE — Telephone Encounter (Signed)
Pt would like a call back and has questions about her blood work.

## 2020-03-30 NOTE — Telephone Encounter (Signed)
RTC; Pt states she is sad and upset because she was just informed by [facility] that she can no longer donate plasma because her ATYA came back positive. She has questions about this result and would like to speak to MD.  Will forward to Dr. Gilford Rile. Thank you, Higinio Roger, RN,BSN

## 2020-04-04 ENCOUNTER — Other Ambulatory Visit: Payer: Self-pay | Admitting: Allergy

## 2020-04-13 ENCOUNTER — Ambulatory Visit (INDEPENDENT_AMBULATORY_CARE_PROVIDER_SITE_OTHER): Payer: BC Managed Care – PPO | Admitting: Allergy

## 2020-04-13 ENCOUNTER — Encounter: Payer: Self-pay | Admitting: Allergy

## 2020-04-13 ENCOUNTER — Other Ambulatory Visit: Payer: Self-pay

## 2020-04-13 VITALS — BP 118/68 | HR 75 | Temp 97.4°F | Resp 18

## 2020-04-13 DIAGNOSIS — Z889 Allergy status to unspecified drugs, medicaments and biological substances status: Secondary | ICD-10-CM | POA: Diagnosis not present

## 2020-04-13 DIAGNOSIS — J302 Other seasonal allergic rhinitis: Secondary | ICD-10-CM | POA: Diagnosis not present

## 2020-04-13 DIAGNOSIS — R12 Heartburn: Secondary | ICD-10-CM

## 2020-04-13 DIAGNOSIS — J4540 Moderate persistent asthma, uncomplicated: Secondary | ICD-10-CM | POA: Diagnosis not present

## 2020-04-13 DIAGNOSIS — H1010 Acute atopic conjunctivitis, unspecified eye: Secondary | ICD-10-CM

## 2020-04-13 DIAGNOSIS — L2089 Other atopic dermatitis: Secondary | ICD-10-CM | POA: Diagnosis not present

## 2020-04-13 DIAGNOSIS — J3089 Other allergic rhinitis: Secondary | ICD-10-CM

## 2020-04-13 MED ORDER — MONTELUKAST SODIUM 10 MG PO TABS: 10.0000 mg | ORAL_TABLET | Freq: Every day | ORAL | 5 refills | Status: DC

## 2020-04-13 NOTE — Assessment & Plan Note (Signed)
Past history - reactions to amoxicillin, cefuroxime, hydrocodone, levofloxacin, morphine, oxycodone, and propoxyphene in the past.   Continue to avoid all these medications.  

## 2020-04-13 NOTE — Assessment & Plan Note (Signed)
Stable.  Continue Pepcid 20mg  1/2 tablet twice a day.

## 2020-04-13 NOTE — Patient Instructions (Addendum)
Moderate persistent asthma  Today's spirometry was normal.   Daily controller medication(s): ? Continue Breo 200 1 puff daily and rinse mouth afterwards. ? Continue Spiriva 2 puffs daily.  ? Continue Singulair 10mg  daily  Prior to physical activity:May use albuterol rescue inhaler 2 puffs 5 to 15 minutes prior to strenuous physical activities.  Rescue medications:May use albuterol rescue inhaler 2 puffs or nebulizer every 4 to 6 hours as needed for shortness of breath, chest tightness, coughing, and wheezing. Monitor frequency of use During upper respiratory infections/asthma flares: Start Arnuity 100 1 puff once a day for 1-2 weeks and rinse mouth afterwards.  Asthma control goals:  Full participation in all desired activities (may need albuterol before activity) Albuterol use two times or less a week on average (not counting use with activity) Cough interfering with sleep two times or less a month Oral steroids no more than once a year No hospitalizations  Seasonal and perennial allergic rhinoconjunctivitis  Continue zyrtec (cetirizine) 10mg  daily.  Continue Singulair (montelukast) 10mg  daily.   Restart Nasacort 1 spray 2 times a day for nasal congestion as needed.   May use Azelastine nasal spray 2 sprays 2 times a day for postnasal drip as needed.   Nasal saline spray (i.e., Simply Saline) or nasal saline lavage (i.e., NeilMed) is recommended as needed and prior to medicated nasal sprays.  Continue environmental control measures - especially regarding the cats.  Consider allergy injections in the future.   Other atopic dermatitis  Continue proper skin care measures.  May use triamcinolone 0.1% ointment twice a day as needed for the hands. Do not use on the face, neck, armpits or groin area. Do not use more than 3 weeks in a row.   Multiple drug allergies Past history - reactions to amoxicillin, cefuroxime, hydrocodone, levofloxacin, morphine, oxycodone, and  propoxyphene in the past.   Continue to avoid all these medications.   Heartburn  Continue Pepcid 1/2 tablet twice a day.  Follow up in 4 months or sooner if needed.   Pet Allergen Avoidance: . Contrary to popular opinion, there are no "hypoallergenic" breeds of dogs or cats. That is because people are not allergic to an animal's hair, but to an allergen found in the animal's saliva, dander (dead skin flakes) or urine. Pet allergy symptoms typically occur within minutes. For some people, symptoms can build up and become most severe 8 to 12 hours after contact with the animal. People with severe allergies can experience reactions in public places if dander has been transported on the pet owners' clothing. Marland Kitchen Keeping an animal outdoors is only a partial solution, since homes with pets in the yard still have higher concentrations of animal allergens. . Before getting a pet, ask your allergist to determine if you are allergic to animals. If your pet is already considered part of your family, try to minimize contact and keep the pet out of the bedroom and other rooms where you spend a great deal of time. . As with dust mites, vacuum carpets often or replace carpet with a hardwood floor, tile or linoleum. . High-efficiency particulate air (HEPA) cleaners can reduce allergen levels over time. . While dander and saliva are the source of cat and dog allergens, urine is the source of allergens from rabbits, hamsters, mice and [place] pigs; so ask a non-allergic family member to clean the animal's cage. . If you have a pet allergy, talk to your allergist about the potential for allergy immunotherapy (allergy shots). This strategy  can often provide long-term relief.

## 2020-04-13 NOTE — Assessment & Plan Note (Addendum)
Past history - Diagnosed with asthma over 19 years ago. Patient moved in with her parents who have cats and dogs at home during this time. Did not tolerate Trelegy.  Interim history - doing better with below regimen but still wakes up about once a week at night to use inhaler. 2 cats indoors in her bedroom. Used Arnuity for 1 week with good benefit. Some days forgets to take daily inhalers.   Today's spirometry was normal.   Act score: 19  Daily controller medication(s) - stressed importance of using daily.  ? Continue Breo 200 1 puff daily and rinse mouth afterwards. ? Continue Spiriva 2 puffs daily.  ? Continue Singulair 10mg  daily  Prior to physical activity:May use albuterol rescue inhaler 2 puffs 5 to 15 minutes prior to strenuous physical activities.  Rescue medications:May use albuterol rescue inhaler 2 puffs or nebulizer every 4 to 6 hours as needed for shortness of breath, chest tightness, coughing, and wheezing. Monitor frequency of use During upper respiratory infections/asthma flares: Start Arnuity 100 1 puff once a day for 1-2 weeks and rinse mouth afterwards.   Repeat spirometry at next visit.

## 2020-04-13 NOTE — Assessment & Plan Note (Signed)
Past history - Eucrisa not effective.  Interim history - some flares on hands when increased hand washing.   Continue proper skin care measures.  May use triamcinolone 0.1% ointment twice a day as needed for the hands. Do not use on the face, neck, armpits or groin area. Do not use more than 3 weeks in a row.

## 2020-04-13 NOTE — Progress Notes (Signed)
Follow Up Note  RE: Erin Collins MRN: [id number] DOB: Nov 18, 1976 Date of Office Visit: 04/13/2020  Referring provider: Maudie Mercury, MD Primary care provider: Maudie Mercury, MD  Chief Complaint: Follow-up, Allergies, and Asthma  History of Present Illness: I had the pleasure of seeing Erin Collins for a follow up visit at the [facility] on 04/13/2020. She is a 44 y.o. female, who is being followed for asthma, allergic rhinoconjunctivitis, atopic dermatitis, multiple drug allergies and heartburn. Her previous allergy office visit was on 02/10/2020 with Dr. Maudie Mercury. Today is a regular follow up visit.  Asthma: Currently on Breo 200 1 puff daily, Spiriva 2 puffs daily and Singulair daily with good benefit. Used Arnuity 100 1 puff daily for about 1 week. Using albuterol 3-4 times last month. Usually has to use it around 2-3AM and her breathing wakes her up her in the middle of the night.  Denies any ER/urgent care visits or prednisone use since the last visit. Some days she does forget to take her daily inhalers. She also has 2 indoor cats that sleep with her in the bedroom. She does have a HEPA filter running in the bedroom.  Seasonal and perennial allergic rhinoconjunctivitis - 2 cats at home.  Currently on zyrtec and Singulair daily at night.  Carbinoxamine was not helping.  Using azelastine 2 sprays per nostril once a day with good benefit.  Using refresh eye drops with good benefit.  Some nasal congestion especially at night but did not restart Nasacort.   Other atopic dermatitis Doing okay but flares if washing hands too much. Uses triamcinolone ointment during flare up.   Heartburn Stable with Pepcid 20mg  1/2 tablet twice a day.  Assessment and Plan: Erin Collins is a 45 y.o. female with: Not well controlled moderate persistent asthma Past history - Diagnosed with asthma over 19 years ago. Patient moved in with her parents who have cats and dogs at home during  this time. Did not tolerate Trelegy.  Interim history - doing better with below regimen but still wakes up about once a week at night to use inhaler. 2 cats indoors in her bedroom. Used Arnuity for 1 week with good benefit. Some days forgets to take daily inhalers.   Today's spirometry was normal.   Act score: 19  Daily controller medication(s) - stressed importance of using daily.  ? Continue Breo 200 1 puff daily and rinse mouth afterwards. ? Continue Spiriva 2 puffs daily.  ? Continue Singulair 10mg  daily  Prior to physical activity:May use albuterol rescue inhaler 2 puffs 5 to 15 minutes prior to strenuous physical activities.  Rescue medications:May use albuterol rescue inhaler 2 puffs or nebulizer every 4 to 6 hours as needed for shortness of breath, chest tightness, coughing, and wheezing. Monitor frequency of use During upper respiratory infections/asthma flares: Start Arnuity 100 1 puff once a day for 1-2 weeks and rinse mouth afterwards.   Repeat spirometry at next visit.   Seasonal and perennial allergic rhinoconjunctivitis Past history - Perennial rhinoconjunctivitis symptoms for the past 20 years with worsening in the spring and fall.  Patient has tried Zyrtec, Singulair, Nasacort, saline nasal spray and Zaditor eyedrops with some benefit.  Patient used to be on allergy immunotherapy for 10 years with good benefit. 2019 skin testing showed, positive to dust mites, cat, dog, grass and horse. 2 indoor cats at home.  Interim history - increased nasal congestion at night. Carbinoxamine ineffective.   Continue zyrtec (cetirizine) 10mg  daily.  Continue Singulair (montelukast) 10mg  daily.   Restart Nasacort 1 spray 2 times a day for nasal congestion as needed.   May use Azelastine nasal spray 2 sprays 2 times a day for postnasal drip as needed.   Nasal saline spray (i.e., Simply Saline) or nasal saline lavage (i.e., NeilMed) is recommended as needed and prior to medicated nasal  sprays.  Continue environmental control measures - especially regarding the cats.  Consider allergy injections in the future.   Other atopic dermatitis Past history - Eucrisa not effective.  Interim history - some flares on hands when increased hand washing.   Continue proper skin care measures.  May use triamcinolone 0.1% ointment twice a day as needed for the hands. Do not use on the face, neck, armpits or groin area. Do not use more than 3 weeks in a row.   Multiple drug allergies Past history - reactions to amoxicillin, cefuroxime, hydrocodone, levofloxacin, morphine, oxycodone, and propoxyphene in the past.   Continue to avoid all these medications.   Heartburn Stable.  Continue Pepcid 20mg  1/2 tablet twice a day.  Return in about 4 months (around 08/13/2020).  Meds ordered this encounter  Medications  . montelukast (SINGULAIR) 10 MG tablet    Sig: Take 1 tablet (10 mg total) by mouth at bedtime.    Dispense:  30 tablet    Refill:  5   Diagnostics: Spirometry:  Tracings reviewed. Her effort: Good reproducible efforts. FVC: 3.13L FEV1: 2.46L, 82% predicted FEV1/FVC ratio: 79% Interpretation: Spirometry consistent with normal pattern.  Please see scanned spirometry results for details.  Medication List:  Current Outpatient Medications  Medication Sig Dispense Refill  . albuterol (ACCUNEB) 0.63 MG/3ML nebulizer solution Take 6 mLs (1.26 mg total) by nebulization every 4 (four) hours as needed for wheezing or shortness of breath. 75 mL 0  . albuterol (VENTOLIN HFA) 108 (90 Base) MCG/ACT inhaler Inhale 1-2 puffs into the lungs every 6 (six) hours as needed for wheezing or shortness of breath. IM program, Ventolin if ok through 340B 8 g 10  . azelastine (ASTELIN) 0.1 % nasal spray Take 1-2 sprays in each nostril 1-2 times a day as needed for nasal drainage. 30 mL 5  . famotidine (PEPCID) 40 MG tablet Take 1/2 tablet twice a day. 30 tablet 2  . Fluticasone Furoate  (ARNUITY ELLIPTA) 100 MCG/ACT AEPB Inhale 1 puff into the lungs daily. 30 each 5  . fluticasone furoate-vilanterol (BREO ELLIPTA) 200-25 MCG/INH AEPB Inhale 1 puff into the lungs daily. 60 each 5  . Guaifenesin 1200 MG TB12 Take 1 tablet (1,200 mg total) by mouth 2 (two) times daily. 20 each 0  . Tiotropium Bromide Monohydrate (SPIRIVA RESPIMAT) 1.25 MCG/ACT AERS Inhale 2 puffs into the lungs daily. 4 g 5  . montelukast (SINGULAIR) 10 MG tablet Take 1 tablet (10 mg total) by mouth at bedtime. 30 tablet 5   No current facility-administered medications for this visit.   Allergies: Allergies  Allergen Reactions  . Amoxicillin Hives  . Cefuroxime Axetil Hives  . Hydrocodone Itching  . Levofloxacin Other (See Comments)    tendonitis  . Morphine Itching  . Oxycodone-Acetaminophen Itching  . Penicillins Hives    Has patient had a PCN reaction causing immediate rash, facial/tongue/throat swelling, SOB or lightheadedness with hypotension: Yes Has patient had a PCN reaction causing severe rash involving mucus membranes or skin necrosis: No Has patient had a PCN reaction that required hospitalization: No Has patient had a PCN reaction occurring within the  last 10 years: No If all of the above answers are "NO", then may proceed with Cephalosporin use.   Marland Kitchen Propoxyphene Itching   I reviewed her past medical history, social history, family history, and environmental history and no significant changes have been reported from her previous visit.  Review of Systems  Constitutional: Negative for appetite change, chills, fatigue, fever and unexpected weight change.  HENT: Positive for congestion and postnasal drip. Negative for rhinorrhea.   Eyes: Negative for itching.  Respiratory: Negative for cough, chest tightness, shortness of breath and wheezing.   Cardiovascular: Negative for chest pain.  Gastrointestinal: Negative for abdominal pain.  Genitourinary: Negative for difficulty urinating.  Skin:  Negative for rash.  Allergic/Immunologic: Positive for environmental allergies. Negative for food allergies.  Neurological: Positive for headaches.   Objective: BP 118/68 (BP Location: Left Arm, Patient Position: Sitting, Cuff Size: Normal)   Pulse 75   Temp (!) 97.4 F (36.3 C) (Temporal)   Resp 18   SpO2 97%  There is no height or weight on file to calculate BMI. Physical Exam  Constitutional: She is oriented to person, place, and time. She appears well-developed and well-nourished.  HENT:  Head: Normocephalic and atraumatic.  Right Ear: External ear normal.  Left Ear: External ear normal.  Nose: Nose normal.  Mouth/Throat: Oropharynx is clear and moist.  Eyes: Conjunctivae and EOM are normal.  Cardiovascular: Normal rate, regular rhythm and normal heart sounds. Exam reveals no gallop and no friction rub.  No murmur heard. Pulmonary/Chest: Effort normal and breath sounds normal. She has no wheezes. She has no rales.  Abdominal: Soft.  Musculoskeletal:     Cervical back: Neck supple.  Neurological: She is alert and oriented to person, place, and time.  Skin: Skin is warm. No rash noted.  Psychiatric: She has a normal mood and affect. Her behavior is normal.  Nursing note and vitals reviewed.  Previous notes and tests were reviewed. The plan was reviewed with the patient/family, and all questions/concerned were addressed.  It was my pleasure to see Erin Collins today and participate in her care. Please feel free to contact me with any questions or concerns.  Sincerely,  Rexene Alberts, DO Allergy & Immunology  [facility] office: [phone] [facility] office: [place] office: [phone]

## 2020-04-13 NOTE — Assessment & Plan Note (Signed)
Past history - Perennial rhinoconjunctivitis symptoms for the past 20 years with worsening in the spring and fall.  Patient has tried Zyrtec, Singulair, Nasacort, saline nasal spray and Zaditor eyedrops with some benefit.  Patient used to be on allergy immunotherapy for 10 years with good benefit. 2019 skin testing showed, positive to dust mites, cat, dog, grass and horse. 2 indoor cats at home.  Interim history - increased nasal congestion at night. Carbinoxamine ineffective.   Continue zyrtec (cetirizine) 10mg  daily.  Continue Singulair (montelukast) 10mg  daily.   Restart Nasacort 1 spray 2 times a day for nasal congestion as needed.   May use Azelastine nasal spray 2 sprays 2 times a day for postnasal drip as needed.   Nasal saline spray (i.e., Simply Saline) or nasal saline lavage (i.e., NeilMed) is recommended as needed and prior to medicated nasal sprays.  Continue environmental control measures - especially regarding the cats.  Consider allergy injections in the future.

## 2020-04-20 ENCOUNTER — Other Ambulatory Visit: Payer: BC Managed Care – PPO

## 2020-04-22 ENCOUNTER — Ambulatory Visit
Admission: RE | Admit: 2020-04-22 | Discharge: 2020-04-22 | Disposition: A | Discharge status: Home / Self Care | Payer: BC Managed Care – PPO | Source: Ambulatory Visit | Attending: Internal Medicine | Admitting: Internal Medicine

## 2020-04-22 ENCOUNTER — Ambulatory Visit: Payer: BC Managed Care – PPO | Admitting: Advanced Practice Midwife

## 2020-04-22 ENCOUNTER — Other Ambulatory Visit (HOSPITAL_COMMUNITY)
Admission: RE | Admit: 2020-04-22 | Discharge: 2020-04-22 | Disposition: A | Discharge status: Home / Self Care | Payer: BC Managed Care – PPO | Source: Ambulatory Visit | Attending: Advanced Practice Midwife | Admitting: Advanced Practice Midwife

## 2020-04-22 ENCOUNTER — Encounter: Payer: Self-pay | Admitting: Advanced Practice Midwife

## 2020-04-22 ENCOUNTER — Other Ambulatory Visit: Payer: Self-pay

## 2020-04-22 VITALS — BP 118/83 | HR 81 | Ht 64.0 in | Wt 190.9 lb

## 2020-04-22 DIAGNOSIS — Z1239 Encounter for other screening for malignant neoplasm of breast: Secondary | ICD-10-CM

## 2020-04-22 DIAGNOSIS — M816 Localized osteoporosis [Lequesne]: Secondary | ICD-10-CM

## 2020-04-22 DIAGNOSIS — Z1272 Encounter for screening for malignant neoplasm of vagina: Secondary | ICD-10-CM

## 2020-04-22 DIAGNOSIS — Z01419 Encounter for gynecological examination (general) (routine) without abnormal findings: Secondary | ICD-10-CM

## 2020-04-22 DIAGNOSIS — Z9851 Tubal ligation status: Secondary | ICD-10-CM

## 2020-04-22 DIAGNOSIS — Z9889 Other specified postprocedural states: Secondary | ICD-10-CM | POA: Diagnosis not present

## 2020-04-22 DIAGNOSIS — M8589 Other specified disorders of bone density and structure, multiple sites: Secondary | ICD-10-CM | POA: Diagnosis not present

## 2020-04-22 MED ORDER — FLUCONAZOLE 150 MG PO TABS: 150.0000 mg | ORAL_TABLET | Freq: Once | ORAL | 0 refills | Status: AC

## 2020-04-22 NOTE — Progress Notes (Signed)
GYNECOLOGY ANNUAL PREVENTATIVE CARE ENCOUNTER NOTE  History:     Erin Collins is a 44 y.o. G1P0 female here for a routine annual gynecologic exam and to establish care. Patient does have a PCP.  Current complaints: none.   Denies abnormal vaginal bleeding, discharge, pelvic pain, problems with intercourse or other gynecologic concerns.    Patient lives with her grandparents. She endorses an active life with plenty of hobbies. Non-smoker, denies SI, HI, IPV.   Gynecologic History Patient's last menstrual period was 04/03/2020 (exact date). Contraception: right tubal ligation, hx endometriosis Last Pap: remote. Results were: normal with negative HPV Last mammogram: remote. Results benign biopsies  Obstetric History OB History  Gravida Para Term Preterm AB Living  1         1  SAB TAB Ectopic Multiple Live Births          1    # Outcome Date GA Lbr Len/2nd Weight Sex Delivery Anes PTL Lv  1 Gravida             Past Medical History:  Diagnosis Date  . Anemia   . Angio-edema   . Asthma   . Basal cell carcinoma ~ 1999   collarbone  . Blood clot in vein ~ 2008   "left breast"   . Complication of anesthesia    "I have trouble waking up afterwards" (12/18/2017)  . Diarrhea 04/01/2019  . Eczema   . Family history of adverse reaction to anesthesia    "mom has trouble waking up afterwards" (12/18/2017)  . Fluttering heart    "comes and goes" (12/18/2017)  . GERD (gastroesophageal reflux disease)   . Headache    "weekly" (12/18/2017)  . History of kidney stones   . Recurrent upper respiratory infection (URI)   . 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 Torticollis   . Vertigo     Past Surgical History:  Procedure Laterality Date  . BASAL CELL CARCINOMA EXCISION Right ~ 1999   "collarbone"  . DILATION AND CURETTAGE OF UTERUS  2005  . LAPAROSCOPIC ENDOMETRIOSIS FULGURATION  2005  . NASAL SEPTOPLASTY W/ TURBINOPLASTY Bilateral 2014  . SINOSCOPY    . TONSILLECTOMY   1980  . TUBAL LIGATION Right 2005  . TYMPANOSTOMY TUBE PLACEMENT      Current Outpatient Medications on File Prior to Visit  Medication Sig Dispense Refill  . albuterol (ACCUNEB) 0.63 MG/3ML nebulizer solution Take 6 mLs (1.26 mg total) by nebulization every 4 (four) hours as needed for wheezing or shortness of breath. 75 mL 0  . albuterol (VENTOLIN HFA) 108 (90 Base) MCG/ACT inhaler Inhale 1-2 puffs into the lungs every 6 (six) hours as needed for wheezing or shortness of breath. IM program, Ventolin if ok through 340B 8 g 10  . azelastine (ASTELIN) 0.1 % nasal spray Take 1-2 sprays in each nostril 1-2 times a day as needed for nasal drainage. 30 mL 5  . famotidine (PEPCID) 40 MG tablet Take 1/2 tablet twice a day. 30 tablet 2  . fluticasone furoate-vilanterol (BREO ELLIPTA) 200-25 MCG/INH AEPB Inhale 1 puff into the lungs daily. 60 each 5  . montelukast (SINGULAIR) 10 MG tablet Take 1 tablet (10 mg total) by mouth at bedtime. 30 tablet 5  . Tiotropium Bromide Monohydrate (SPIRIVA RESPIMAT) 1.25 MCG/ACT AERS Inhale 2 puffs into the lungs daily. 4 g 5   No current facility-administered medications on file prior to visit.    Allergies  Allergen  Reactions  . Amoxicillin Hives  . Cefuroxime Axetil Hives  . Hydrocodone Itching  . Levofloxacin Other (See Comments)    tendonitis  . Morphine Itching  . Oxycodone-Acetaminophen Itching  . Penicillins Hives    Has patient had a PCN reaction causing immediate rash, facial/tongue/throat swelling, SOB or lightheadedness with hypotension: Yes Has patient had a PCN reaction causing severe rash involving mucus membranes or skin necrosis: No Has patient had a PCN reaction that required hospitalization: No Has patient had a PCN reaction occurring within the last 10 years: No If all of the above answers are "NO", then may proceed with Cephalosporin use.   Marland Kitchen Propoxyphene Itching    Social History:  reports that she has never smoked. She has never  used smokeless tobacco. She reports previous alcohol use. She reports that she does not use drugs.  Family History  Problem Relation Age of Onset  . Allergic rhinitis Mother   . Asthma Mother     The following portions of the patient's history were reviewed and updated as appropriate: allergies, current medications, past family history, past medical history, past social history, past surgical history and problem list.  Review of Systems Pertinent items noted in HPI and remainder of comprehensive ROS otherwise negative.  Physical Exam:  BP 118/83   Pulse 81   Ht 5\' 4"  (1.626 m)   Wt 190 lb 14.4 oz (86.6 kg)   LMP 04/03/2020 (Exact Date)   BMI 32.77 kg/m  CONSTITUTIONAL: Well-developed, well-nourished female in no acute distress.  HENT:  Normocephalic, atraumatic, External right and left ear normal. Oropharynx is clear and moist EYES: Conjunctivae and EOM are normal. Pupils are equal, round, and reactive to light. No scleral icterus.  NECK: Normal range of motion, supple, no masses.  Normal thyroid.  SKIN: Skin is warm and dry. No rash noted. Not diaphoretic. No erythema. No pallor. MUSCULOSKELETAL: Normal range of motion. No tenderness.  No cyanosis, clubbing, or edema.  2+ distal pulses. NEUROLOGIC: Alert and oriented to person, place, and time. Normal reflexes, muscle tone coordination.  PSYCHIATRIC: Normal mood and affect. Normal behavior. Normal judgment and thought content. CARDIOVASCULAR: Normal heart rate noted, regular rhythm RESPIRATORY: Clear to auscultation bilaterally. Effort and breath sounds normal, no problems with respiration noted. BREASTS: Symmetric in size. No masses, tenderness, skin changes, nipple drainage, or lymphadenopathy bilaterally. Performed in the presence of a chaperone. ABDOMEN: Soft, no distention noted.  No tenderness, rebound or guarding.  PELVIC: Normal appearing external genitalia and urethral meatus; normal appearing vaginal mucosa and cervix.   Thick white discharge noted throughout vault.  Pap smear obtained.  Normal uterine size, no other palpable masses, no uterine or adnexal tenderness.  Performed in the presence of a chaperone.   Assessment and Plan:    1. Women's annual routine gynecological examination - Blood work managed by PCP - Treat presumptively for yeast infection - Cytology - PAP( [facility])  2. Breast screening  - MM Digital Screening; Future  3. Hx of breast biopsy - Clip in place, left breast  Will follow up results of pap smear and manage accordingly. Mammogram scheduled Routine preventative health maintenance measures emphasized. Please refer to After Visit Summary for other counseling recommendations.      Mallie Snooks, MSN, CNM Certified Nurse Midwife, [facility] for [employer], [place] 04/22/20 9:14 PM

## 2020-04-22 NOTE — Patient Instructions (Signed)

## 2020-04-28 LAB — CYTOLOGY - PAP
Comment: NEGATIVE
Diagnosis: NEGATIVE
High risk HPV: NEGATIVE

## 2020-06-03 ENCOUNTER — Other Ambulatory Visit: Payer: Self-pay | Admitting: Allergy

## 2020-08-17 ENCOUNTER — Ambulatory Visit: Payer: BC Managed Care – PPO | Admitting: Allergy

## 2020-09-06 NOTE — Progress Notes (Signed)
Follow Up Note  RE: Erin Collins MRN: [id number] DOB: 10-03-1976 Date of Office Visit: 09/07/2020  Referring provider: Maudie Mercury, MD Primary care provider: Maudie Mercury, MD  Chief Complaint: Asthma  History of Present Illness: I had the pleasure of seeing Erin Collins for a follow up visit at the [facility] on 09/07/2020. She is a 44 y.o. female, who is being followed for asthma, allergic rhinoconjunctivitis, atopic dermatitis, multiple drug allergies and heartburn. Her previous allergy office visit was on 04/13/2020 with Dr. Maudie Mercury. Today is a regular follow up visit.  Asthma:  Patient stopped inhalers since the last visit. Breathing better since cats are not in the bedroom.  Currently taking montelukast 10mg  daily at night.  Denies any SOB, coughing, wheezing, chest tightness, nocturnal awakenings, ER/urgent care visits or prednisone use since the last visit.  Seasonal and perennial allergic rhinoconjunctivitis Taking zyrtec 10mg  daily and not taking any nasal sprays. Patient has removed the cats from her bedroom which is helping her symptoms.  Other atopic dermatitis No eczema flares and doing well with no topical steroid creams.  Usually flares in the winter months.   Heartburn Stopped Pepcid 2 months ago with no heartburn flares - patient also went back to eating healthier and doing keto diet.   Assessment and Plan: Erin Collins is a 44 y.o. female with: Asthma, well controlled, moderate persistent Past history - Diagnosed with asthma over 19 years ago. Patient moved in with her parents who have cats and dogs at home during this time. Did not tolerate Trelegy.  Interim history - stopped all daily inhalers with no flares in symptoms. Removed cats from the bedroom.  Today's spirometry was normal.   Daily controller medication(s): ? Continue Singulair 10mg  daily.  May use albuterol rescue inhaler 2 puffs every 4 to 6 hours as needed for shortness of  breath, chest tightness, coughing, and wheezing. May use albuterol rescue inhaler 2 puffs 5 to 15 minutes prior to strenuous physical activities. Monitor frequency of use.   During upper respiratory infections/asthma flares: Start Breo 23mcg 1 puff once a day for 1-2 weeks and rinse mouth afterwards.   Seasonal and perennial allergic rhinoconjunctivitis Past history - Perennial rhinoconjunctivitis symptoms for the past 20 years with worsening in the spring and fall.  Patient has tried Zyrtec, Singulair, Nasacort, saline nasal spray and Zaditor eyedrops with some benefit.  Patient used to be on allergy immunotherapy for 10 years with good benefit. 2019 skin testing showed, positive to dust mites, cat, dog, grass and horse. 2 indoor cats at home.  Interim history - stable with zyrtec and Singulair daily.   Continue Singulair (montelukast) 10mg  daily.   May use over the counter antihistamines such as Zyrtec (cetirizine), Claritin (loratadine), Allegra (fexofenadine), or Xyzal (levocetirizine) daily as needed. May take twice a day if needed.  May use Nasacort 1 spray per nostril 2 times a day for nasal congestion as needed.   May use azelastine nasal spray 1-2 sprays per nostril twice a day as needed for runny nose/drainage.  Nasal saline spray (i.e., Simply Saline) or nasal saline lavage (i.e., NeilMed) is recommended as needed and prior to medicated nasal sprays.  Continue environmental control measures - especially regarding the cats.  Consider allergy injections in the future if symptoms not controlled.  Heartburn Stopped Pepcid 2 months ago with no flare in symptoms. Changed diet with good benefit.   Multiple drug allergies Past history - reactions to amoxicillin, cefuroxime, hydrocodone, levofloxacin,  morphine, oxycodone, and propoxyphene in the past.   Continue to avoid all these medications.   Other atopic dermatitis Past history - Eucrisa not effective.  Interim history - stable  with no issues.   Continue proper skin care measures.   May use triamcinolone 0.1% ointment twice a day as needed for the hands. Do not use on the face, neck, armpits or groin area. Do not use more than 3 weeks in a row.   Return in about 6 months (around 03/07/2021).  Diagnostics: Spirometry:  Tracings reviewed. Her effort: Good reproducible efforts. FVC: 3.20L FEV1: 2.54L, 86% predicted FEV1/FVC ratio: 79% Interpretation: Spirometry consistent with normal pattern.  Please see scanned spirometry results for details.  Medication List:  Current Outpatient Medications  Medication Sig Dispense Refill  . montelukast (SINGULAIR) 10 MG tablet TAKE 1 TABLET BY MOUTH AT BEDTIME 30 tablet 2  . albuterol (ACCUNEB) 0.63 MG/3ML nebulizer solution Take 6 mLs (1.26 mg total) by nebulization every 4 (four) hours as needed for wheezing or shortness of breath. (Patient not taking: Reported on 09/07/2020) 75 mL 0  . albuterol (VENTOLIN HFA) 108 (90 Base) MCG/ACT inhaler Inhale 1-2 puffs into the lungs every 6 (six) hours as needed for wheezing or shortness of breath. IM program, Ventolin if ok through [facility] (Patient not taking: Reported on 09/07/2020) 8 g 10  . azelastine (ASTELIN) 0.1 % nasal spray Take 1-2 sprays in each nostril 1-2 times a day as needed for nasal drainage. (Patient not taking: Reported on 09/07/2020) 30 mL 5  . fluticasone furoate-vilanterol (BREO ELLIPTA) 200-25 MCG/INH AEPB Inhale 1 puff into the lungs daily. (Patient not taking: Reported on 09/07/2020) 60 each 5   No current facility-administered medications for this visit.   Allergies: Allergies  Allergen Reactions  . Amoxicillin Hives  . Cefuroxime Axetil Hives  . Hydrocodone Itching  . Levofloxacin Other (See Comments)    tendonitis  . Morphine Itching  . Oxycodone-Acetaminophen Itching  . Penicillins Hives    Has patient had a PCN reaction causing immediate rash, facial/tongue/throat swelling, SOB or lightheadedness with  hypotension: Yes Has patient had a PCN reaction causing severe rash involving mucus membranes or skin necrosis: No Has patient had a PCN reaction that required hospitalization: No Has patient had a PCN reaction occurring within the last 10 years: No If all of the above answers are "NO", then may proceed with Cephalosporin use.   Marland Kitchen Propoxyphene Itching   I reviewed her past medical history, social history, family history, and environmental history and no significant changes have been reported from her previous visit.  Review of Systems  Constitutional: Negative for appetite change, chills, fatigue, fever and unexpected weight change.  HENT: Negative for congestion, postnasal drip and rhinorrhea.   Eyes: Negative for itching.  Respiratory: Negative for cough, chest tightness, shortness of breath and wheezing.   Cardiovascular: Negative for chest pain.  Gastrointestinal: Negative for abdominal pain.  Genitourinary: Negative for difficulty urinating.  Skin: Negative for rash.  Allergic/Immunologic: Positive for environmental allergies. Negative for food allergies.   Objective: BP 118/72   Pulse 74   Temp 98 F (36.7 C) (Temporal)   Resp 16   SpO2 98%  There is no height or weight on file to calculate BMI. Physical Exam Vitals and nursing note reviewed.  Constitutional:      Appearance: Normal appearance. She is well-developed.  HENT:     Head: Normocephalic and atraumatic.     Right Ear: Tympanic membrane and external ear  normal.     Left Ear: Tympanic membrane and external ear normal.     Nose: Nose normal. No congestion or rhinorrhea.     Mouth/Throat:     Mouth: Mucous membranes are moist.     Pharynx: Oropharynx is clear.  Eyes:     Conjunctiva/sclera: Conjunctivae normal.  Cardiovascular:     Rate and Rhythm: Normal rate and regular rhythm.     Heart sounds: Normal heart sounds. No murmur heard.  No friction rub. No gallop.   Pulmonary:     Effort: Pulmonary effort is  normal.     Breath sounds: Normal breath sounds. No wheezing or rales.  Musculoskeletal:     Cervical back: Neck supple.  Skin:    General: Skin is warm.     Findings: No rash.  Neurological:     Mental Status: She is alert and oriented to person, place, and time.  Psychiatric:        Behavior: Behavior normal.    Previous notes and tests were reviewed. The plan was reviewed with the patient/family, and all questions/concerned were addressed.  It was my pleasure to see Eliot today and participate in her care. Please feel free to contact me with any questions or concerns.  Sincerely,  Rexene Alberts, DO Allergy & Immunology  [facility] office: [phone] [facility] office: [place] office: [phone]

## 2020-09-07 ENCOUNTER — Other Ambulatory Visit: Payer: Self-pay

## 2020-09-07 ENCOUNTER — Encounter: Payer: Self-pay | Admitting: Allergy

## 2020-09-07 ENCOUNTER — Ambulatory Visit: Payer: BC Managed Care – PPO | Admitting: Allergy

## 2020-09-07 VITALS — BP 118/72 | HR 74 | Temp 98.0°F | Resp 16

## 2020-09-07 DIAGNOSIS — J4540 Moderate persistent asthma, uncomplicated: Secondary | ICD-10-CM | POA: Diagnosis not present

## 2020-09-07 DIAGNOSIS — J302 Other seasonal allergic rhinitis: Secondary | ICD-10-CM | POA: Diagnosis not present

## 2020-09-07 DIAGNOSIS — L2089 Other atopic dermatitis: Secondary | ICD-10-CM

## 2020-09-07 DIAGNOSIS — Z889 Allergy status to unspecified drugs, medicaments and biological substances status: Secondary | ICD-10-CM

## 2020-09-07 DIAGNOSIS — J3089 Other allergic rhinitis: Secondary | ICD-10-CM

## 2020-09-07 DIAGNOSIS — R12 Heartburn: Secondary | ICD-10-CM

## 2020-09-07 DIAGNOSIS — H1010 Acute atopic conjunctivitis, unspecified eye: Secondary | ICD-10-CM

## 2020-09-07 NOTE — Patient Instructions (Addendum)
Moderate persistent asthma  Today's spirometry was normal.   Daily controller medication(s): ? Continue Singulair 10mg  daily  May use albuterol rescue inhaler 2 puffs every 4 to 6 hours as needed for shortness of breath, chest tightness, coughing, and wheezing. May use albuterol rescue inhaler 2 puffs 5 to 15 minutes prior to strenuous physical activities. Monitor frequency of use.  During upper respiratory infections/asthma flares: Start Breo 260mcg 1 puff once a day for 1-2 weeks and rinse mouth afterwards.  Asthma control goals:  Full participation in all desired activities (may need albuterol before activity) Albuterol use two times or less a week on average (not counting use with activity) Cough interfering with sleep two times or less a month Oral steroids no more than once a year No hospitalizations  Seasonal and perennial allergic rhinoconjunctivitis  Continue Singulair (montelukast) 10mg  daily.   May use over the counter antihistamines such as Zyrtec (cetirizine), Claritin (loratadine), Allegra (fexofenadine), or Xyzal (levocetirizine) daily as needed. May take twice a day if needed.  May use Nasacort 1 spray per nostril 2 times a day for nasal congestion as needed.   May use azelastine nasal spray 1-2 sprays per nostril twice a day as needed for runny nose/drainage.  Nasal saline spray (i.e., Simply Saline) or nasal saline lavage (i.e., NeilMed) is recommended as needed and prior to medicated nasal sprays.  Continue environmental control measures - especially regarding the cats.  Other atopic dermatitis  Continue proper skin care measures.  May use triamcinolone 0.1% ointment twice a day as needed for the hands. Do not use on the face, neck, armpits or groin area. Do not use more than 3 weeks in a row.   Multiple drug allergies Past history - reactions to amoxicillin, cefuroxime, hydrocodone, levofloxacin, morphine, oxycodone, and propoxyphene in the past.    Continue to avoid all these medications.   Heartburn  Monitor symptoms.  Follow up in 6 months or sooner if needed.   Pet Allergen Avoidance: . Contrary to popular opinion, there are no "hypoallergenic" breeds of dogs or cats. That is because people are not allergic to an animal's hair, but to an allergen found in the animal's saliva, dander (dead skin flakes) or urine. Pet allergy symptoms typically occur within minutes. For some people, symptoms can build up and become most severe 8 to 12 hours after contact with the animal. People with severe allergies can experience reactions in public places if dander has been transported on the pet owners' clothing. Marland Kitchen Keeping an animal outdoors is only a partial solution, since homes with pets in the yard still have higher concentrations of animal allergens. . Before getting a pet, ask your allergist to determine if you are allergic to animals. If your pet is already considered part of your family, try to minimize contact and keep the pet out of the bedroom and other rooms where you spend a great deal of time. . As with dust mites, vacuum carpets often or replace carpet with a hardwood floor, tile or linoleum. . High-efficiency particulate air (HEPA) cleaners can reduce allergen levels over time. . While dander and saliva are the source of cat and dog allergens, urine is the source of allergens from rabbits, hamsters, mice and [place] pigs; so ask a non-allergic family member to clean the animal's cage. . If you have a pet allergy, talk to your allergist about the potential for allergy immunotherapy (allergy shots). This strategy can often provide long-term relief.

## 2020-09-07 NOTE — Assessment & Plan Note (Signed)
Stopped Pepcid 2 months ago with no flare in symptoms. Changed diet with good benefit.

## 2020-09-07 NOTE — Assessment & Plan Note (Signed)
Past history - reactions to amoxicillin, cefuroxime, hydrocodone, levofloxacin, morphine, oxycodone, and propoxyphene in the past.   Continue to avoid all these medications.  

## 2020-09-07 NOTE — Assessment & Plan Note (Addendum)
Past history - Perennial rhinoconjunctivitis symptoms for the past 20 years with worsening in the spring and fall.  Patient has tried Zyrtec, Singulair, Nasacort, saline nasal spray and Zaditor eyedrops with some benefit.  Patient used to be on allergy immunotherapy for 10 years with good benefit. 2019 skin testing showed, positive to dust mites, cat, dog, grass and horse. 2 indoor cats at home.  Interim history - stable with zyrtec and Singulair daily.   Continue Singulair (montelukast) 10mg  daily.   May use over the counter antihistamines such as Zyrtec (cetirizine), Claritin (loratadine), Allegra (fexofenadine), or Xyzal (levocetirizine) daily as needed. May take twice a day if needed.  May use Nasacort 1 spray per nostril 2 times a day for nasal congestion as needed.   May use azelastine nasal spray 1-2 sprays per nostril twice a day as needed for runny nose/drainage.  Nasal saline spray (i.e., Simply Saline) or nasal saline lavage (i.e., NeilMed) is recommended as needed and prior to medicated nasal sprays.  Continue environmental control measures - especially regarding the cats.  Consider allergy injections in the future if symptoms not controlled.

## 2020-09-07 NOTE — Assessment & Plan Note (Signed)
Past history - Eucrisa not effective.  Interim history - stable with no issues.   Continue proper skin care measures.   May use triamcinolone 0.1% ointment twice a day as needed for the hands. Do not use on the face, neck, armpits or groin area. Do not use more than 3 weeks in a row.

## 2020-09-07 NOTE — Assessment & Plan Note (Signed)
Past history - Diagnosed with asthma over 19 years ago. Patient moved in with her parents who have cats and dogs at home during this time. Did not tolerate Trelegy.  Interim history - stopped all daily inhalers with no flares in symptoms. Removed cats from the bedroom.  Today's spirometry was normal.   Daily controller medication(s): ? Continue Singulair 10mg  daily.  May use albuterol rescue inhaler 2 puffs every 4 to 6 hours as needed for shortness of breath, chest tightness, coughing, and wheezing. May use albuterol rescue inhaler 2 puffs 5 to 15 minutes prior to strenuous physical activities. Monitor frequency of use.   During upper respiratory infections/asthma flares: Start Breo 266mcg 1 puff once a day for 1-2 weeks and rinse mouth afterwards.

## 2020-10-08 ENCOUNTER — Encounter: Payer: Self-pay | Admitting: Student

## 2020-10-08 ENCOUNTER — Ambulatory Visit (INDEPENDENT_AMBULATORY_CARE_PROVIDER_SITE_OTHER): Payer: BC Managed Care – PPO | Admitting: Student

## 2020-10-08 ENCOUNTER — Other Ambulatory Visit: Payer: Self-pay

## 2020-10-08 DIAGNOSIS — J019 Acute sinusitis, unspecified: Secondary | ICD-10-CM | POA: Diagnosis not present

## 2020-10-08 DIAGNOSIS — J329 Chronic sinusitis, unspecified: Secondary | ICD-10-CM | POA: Insufficient documentation

## 2020-10-08 MED ORDER — AZITHROMYCIN 500 MG PO TABS: 500.0000 mg | ORAL_TABLET | Freq: Every day | ORAL | 0 refills | Status: DC

## 2020-10-08 NOTE — Progress Notes (Signed)
° °  CC: Sinus Congestion  This is a telephone encounter between Erin Collins and Erin Collins on 10/08/2020 for sinus congestion. The visit was conducted with the patient located at home and Erin Collins at [facility]. The patient's identity was confirmed using their DOB and current address. The patient has consented to being evaluated through a telephone encounter and understands the associated risks (an examination cannot be done and the patient may need to come in for an appointment) / benefits (allows the patient to remain at home, decreasing exposure to coronavirus). I personally spent 20 minutes on medical discussion.   HPI:  [name] is a 44 y.o. with PMH as below.   Please see A&P for assessment of the patient's acute and chronic medical conditions.   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   Assessment & Plan:   See Encounters Tab for problem based charting.  Patient seen with Dr. Emelia Salisbury, MD, MPH

## 2020-10-08 NOTE — Assessment & Plan Note (Signed)
Patient was assessed via telephone. Patient report about 2 to 3 weeks of sinus congestion.  Patient states she gets sinus congestion when the weather change each year. Patient states the last 2 days her symptoms have worsened.  She reports worsening congestion of her sinuses, swollen glands and sinus headaches.  She endorses productive cough with thick yellow phlegm and she is also producing thick yellowish-greenish nasal discharge. Patient denies any any sick contacts, chest pain, palpitations, fever, chills, N/V, abdominal pain or diarrhea. States she has been using her Zyrtec, albuterol inhaler and drinking hot tea with minimal relief. Patient states she has been vaccinated against Covid and plans to get a booster soon. Is allergic to multiple antibiotics but has tried Z-Pak and doxycycline in the past for similar symptoms.  Plan: --Start azithromycin 500 mg daily for 5 days --Continue albuterol inhaler as needed for wheezing or shortness of breath --Continue Zyrtec and conservative management --Patient is advised to get tested for Covid

## 2020-10-12 NOTE — Progress Notes (Signed)
Internal Medicine Clinic Attending   I personally confirmed the key portions of the history and exam documented by Dr. Coy Saunas and I reviewed pertinent patient test results.  The assessment, diagnosis, and plan were formulated together and I agree with the documentation in the resident's note.

## 2020-10-12 NOTE — Addendum Note (Signed)
Addended by: Aldine Contes on: 10/12/2020 10:48 AM   Modules accepted: Level of Service

## 2020-11-10 ENCOUNTER — Other Ambulatory Visit: Payer: Self-pay

## 2020-11-10 ENCOUNTER — Ambulatory Visit (INDEPENDENT_AMBULATORY_CARE_PROVIDER_SITE_OTHER): Payer: BC Managed Care – PPO | Admitting: Student

## 2020-11-10 ENCOUNTER — Encounter: Payer: Self-pay | Admitting: Student

## 2020-11-10 DIAGNOSIS — T753XXA Motion sickness, initial encounter: Secondary | ICD-10-CM

## 2020-11-10 DIAGNOSIS — M7711 Lateral epicondylitis, right elbow: Secondary | ICD-10-CM

## 2020-11-10 DIAGNOSIS — Z0001 Encounter for general adult medical examination with abnormal findings: Secondary | ICD-10-CM | POA: Diagnosis not present

## 2020-11-10 DIAGNOSIS — Z0000 Encounter for general adult medical examination without abnormal findings: Secondary | ICD-10-CM

## 2020-11-10 MED ORDER — SCOPOLAMINE 1 MG/3DAYS TD PT72: 1.0000 | MEDICATED_PATCH | TRANSDERMAL | 0 refills | Status: DC

## 2020-11-10 NOTE — Assessment & Plan Note (Signed)
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 Reports that it comes and goes and she usually uses ibuprofen, tylenol, heat/ice as needed, but they usually do not help too much. Reports that during the past two months, the pain has gotten worse and that she feels it going down her right arm down to the right wrist. She states that it has gotten to the point where she is unable to change gears in her car. She does work part-time with [employer] ([employer]) where she has to pick up some heavy objects (water bottles, etc), which may be contributing to this pain. Discussed the utility of a tennis elbow brace to help relieve tension at the tendon insertion sites on right elbow. Also discussed continuing to use NSAIDs to relieve the inflammation at these sites. Discussed holding off on a steroid injection at the site for now, which she is agreeable with.   Plan: -tennis elbow brace -ibuprofen or tylenol as needed -heat/ice -consider steroid injection if the above fails -f/u visit in 3 months to reevaluate

## 2020-11-10 NOTE — Assessment & Plan Note (Signed)
Patient reports history of motion sickness. She states that she typically uses dramamine when she feels it coming on, but dramamine tends to make her very drowsy. She is going on a cruise for the first time in 2 weeks and is requesting something else for motion sickness that will not make her so drowsy. Unfortunately, the primary options for motion sickness (anticholinergics, antihistamines) tend to cause drowsiness. Discussed the utility of scopolamine patches for motion sickness. Informed patient that scopolamine patches may also cause some drowsiness but it may be more convenient as you can wear the patch for 3 days. She is agreeable to trying it. Sent prescription to patient's preferred pharmacy.  Plan: -1 scopolamine patch every 3 days

## 2020-11-10 NOTE — Patient Instructions (Signed)
Erin Collins,  It was a pleasure seeing you in the clinic today.   We discussed the following today:  1. Please get a tennis elbow brace to wear on your forearm (right below the elbow). Also, use ibuprofen or tylenol as needed to help and avoid lifting heavy objects if possible.  2. I have sent a prescription for scopolamine patches for your cruise. Please use them as directed.  3. Please try to get your flu shot prior to the cruise!  Please call our clinic at [phone] if you have any questions or concerns. The best time to call is Monday-Friday from 9am-4pm, but there is someone available 24/7 at the same number. If you need medication refills, please notify your pharmacy one week in advance and they will send [place] a request.   Thank you for letting us take part in your care. We look forward to seeing you next time!   Tennis Elbow  Tennis elbow (lateral epicondylitis) is inflammation of tendons in your outer forearm, near your elbow. Tendons are tissues that connect muscle to bone. When you have tennis elbow, inflammation affects the tendons that you use to bend your wrist and move your hand up. Inflammation occurs in the lower part of the upper arm bone (humerus), where the tendons connect to the bone (lateral epicondyle). Tennis elbow often affects people who play tennis, but anyone may get the condition from repeatedly extending the wrist or turning the forearm. What are the causes? This condition is usually caused by repeatedly extending the wrist, turning the forearm, and using the hands. It can result from sports or work that requires repetitive forearm movements. In some cases, it may be caused by a sudden injury. What increases the risk? You are more likely to develop tennis elbow if you play tennis or another racket sport. You also have a higher risk if you frequently use your hands for work. Besides people who play tennis, others at greater risk include:  Musicians.  Carpenters,  painters, and plumbers.  Cooks.  Cashiers.  People who work in [employer].  Architect workers.  Butchers.  People who use computers. What are the signs or symptoms? Symptoms of this condition include:  Pain and tenderness in the forearm and the outer part of the elbow. Pain may be felt only when using the arm, or it may be there all the time.  A burning feeling that starts in the elbow and spreads down the forearm.  A weak grip in the hand. How is this diagnosed? This condition may be diagnosed based on:  Your symptoms and medical history.  A physical exam.  X-rays.  MRI. How is this treated? Resting and icing your arm is often the first treatment. Your health care provider may also recommend:  Medicines to reduce pain and inflammation. These may be in the form of a pill, topical gels, or shots of a steroid medicine (cortisone).  An elbow strap to reduce stress on the area.  Physical therapy. This may include massage or exercises.  An elbow brace to restrict the movements that cause symptoms. If these treatments do not help relieve your symptoms, your health care provider may recommend surgery to remove damaged muscle and reattach healthy muscle to bone. Follow these instructions at home: Activity  Rest your elbow and wrist and avoid activities that cause symptoms, as told by your health care provider.  Do physical therapy exercises as instructed.  If you lift an object, lift it with your palm facing  up. This reduces stress on your elbow. Lifestyle  If your tennis elbow is caused by sports, check your equipment and make sure that: ? You are using it correctly. ? It is the best fit for you.  If your tennis elbow is caused by work or computer use, take frequent breaks to stretch your arm. Talk with your manager about ways to manage your condition at work. If you have a brace:  Wear the brace or strap as told by your health care provider. Remove it only as  told by your health care provider.  Loosen the brace if your fingers tingle, become numb, or turn cold and blue.  Keep the brace clean.  If the brace is not waterproof, ask if you may remove it for bathing. If you must keep the brace on while bathing: ? Do not let it get wet. ? Cover it with a watertight covering when you take a bath or a shower. General instructions   If directed, put ice on the painful area: ? Put ice in a plastic bag. ? Place a towel between your skin and the bag. ? Leave the ice on for 20 minutes, 2-3 times a day.  Take over-the-counter and prescription medicines only as told by your health care provider.  Keep all follow-up visits as told by your health care provider. This is important. Contact a health care provider if:  You have pain that gets worse or does not get better with treatment.  You have numbness or weakness in your forearm, hand, or fingers. Summary  Tennis elbow (lateral epicondylitis) is inflammation of tendons in your outer forearm, near your elbow.  Common symptoms include pain and tenderness in your forearm and the outer part of your elbow.  This condition is usually caused by repeatedly extending your wrist, turning your forearm, and using your hands.  The first treatment is often resting and icing your arm to relieve symptoms. Further treatment may include taking medicine, getting physical therapy, wearing a brace or strap, or having surgery. This information is not intended to replace advice given to you by your health care provider. Make sure you discuss any questions you have with your health care provider. Document Revised: 08/31/2018 Document Reviewed: 09/19/2017 Elsevier Patient Education  [place].

## 2020-11-10 NOTE — Assessment & Plan Note (Signed)
Patient is going on a cruise in 2 weeks. Advised her to receive her flu shot today, but she is not agreeable to this as she has had bad reactions in the past (fevers, chills, diarrhea). Emphasized importance of getting the flu shot especially in the setting of going on a cruise during the wintertime. She reports that she will try to get one next week.

## 2020-11-10 NOTE — Progress Notes (Signed)
   CC: right elbow pain  HPI:  [name] is a 44 y.o. female with history listed below presenting to the [facility] for right elbow pain with radiation down right forearm. Please see individualized A&P for full HPI.  Past Medical History:  Diagnosis Date  . Anemia   . Angio-edema   . Asthma   . Basal cell carcinoma ~ 1999   collarbone  . Blood clot in vein ~ 2008   "left breast"   . Complication of anesthesia    "I have trouble waking up afterwards" (12/18/2017)  . Diarrhea 04/01/2019  . Eczema   . Family history of adverse reaction to anesthesia    "mom has trouble waking up afterwards" (12/18/2017)  . Fluttering heart    "comes and goes" (12/18/2017)  . GERD (gastroesophageal reflux disease)   . Headache    "weekly" (12/18/2017)  . History of kidney stones   . Recurrent upper respiratory infection (URI)   . 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 Torticollis   . Vertigo    Review of Systems:   Negative aside from that listed in individualized A&P.  Physical Exam:  Vitals:   11/10/20 1005  BP: 128/87  Pulse: 83  Temp: 97.7 F (36.5 C)  TempSrc: Oral  SpO2: 99%  Weight: 161 lb 1.6 oz (73.1 kg)  Height: 5\' 4"  (1.626 m)   Physical Exam Constitutional:      Appearance: She is obese. She is not ill-appearing.  HENT:     Head: Normocephalic and atraumatic.     Mouth/Throat:     Mouth: Mucous membranes are moist.     Pharynx: Oropharynx is clear.  Eyes:     Extraocular Movements: Extraocular movements intact.     Conjunctiva/sclera: Conjunctivae normal.     Pupils: Pupils are equal, round, and reactive to light.  Cardiovascular:     Rate and Rhythm: Normal rate and regular rhythm.     Pulses: Normal pulses.     Heart sounds: Normal heart sounds. No murmur heard.  No friction rub. No gallop.   Pulmonary:     Effort: Pulmonary effort is normal.     Breath sounds: Normal breath sounds. No wheezing, rhonchi or rales.  Abdominal:     General: Bowel  sounds are normal. There is no distension.     Palpations: Abdomen is soft.     Tenderness: There is no abdominal tenderness. There is no guarding or rebound.  Musculoskeletal:        General: No swelling or deformity.     Cervical back: Normal range of motion.     Comments: No redness or swelling at right elbow joint. Significant pain at lateral epicondyle with active supination of right arm. Mild pain at lateral epicondyle with passive supination of right arm. No pain with pronation. Otherwise normal ROM.   Skin:    General: Skin is warm and dry.     Findings: No rash.  Neurological:     General: No focal deficit present.     Mental Status: She is alert and oriented to person, place, and time.  Psychiatric:        Mood and Affect: Mood normal.        Behavior: Behavior normal.        Thought Content: Thought content normal.      Assessment & Plan:   See Encounters Tab for problem based charting.  Patient seen with Dr. Evette Doffing

## 2020-11-10 NOTE — Progress Notes (Signed)
Internal Medicine Clinic Attending  I saw and evaluated the patient.  I personally confirmed the key portions of the history and exam documented by Dr. Jinwala and I reviewed pertinent patient test results.  The assessment, diagnosis, and plan were formulated together and I agree with the documentation in the resident's note.  

## 2020-12-28 ENCOUNTER — Encounter: Payer: BC Managed Care – PPO | Admitting: Internal Medicine

## 2020-12-28 ENCOUNTER — Telehealth: Payer: Self-pay

## 2020-12-28 ENCOUNTER — Other Ambulatory Visit: Payer: Self-pay | Admitting: Allergy

## 2020-12-28 ENCOUNTER — Other Ambulatory Visit: Payer: BC Managed Care – PPO

## 2020-12-28 DIAGNOSIS — Z20822 Contact with and (suspected) exposure to covid-19: Secondary | ICD-10-CM

## 2020-12-28 DIAGNOSIS — Z03818 Encounter for observation for suspected exposure to other biological agents ruled out: Secondary | ICD-10-CM | POA: Diagnosis not present

## 2020-12-28 NOTE — Telephone Encounter (Signed)
Received TC from patient who states she has an in-person appt today w/ Dr. Marianna Payment for evaluation of elbow pain.  Patient states she was exposed to several coworkers at work last week who had Covid.  Patient now c/o headache and sinus-like symptoms. She was advised to cancel appt for today and get Covid tested.  [place] testing sites appt information given to patient.  She is fully vaccinated and boosted.  Isolation guidelines reviewed.  She will call back to r/s appt with Dr. Marianna Payment for her elbow pain.   SChaplin, RN,BSN

## 2020-12-28 NOTE — Telephone Encounter (Signed)
Sounds good. If she is positive we can set her up with the [place] [facility].

## 2020-12-29 ENCOUNTER — Telehealth: Payer: Self-pay | Admitting: Allergy

## 2020-12-29 ENCOUNTER — Telehealth: Payer: Self-pay | Admitting: *Deleted

## 2020-12-29 DIAGNOSIS — Z20822 Contact with and (suspected) exposure to covid-19: Secondary | ICD-10-CM | POA: Diagnosis not present

## 2020-12-29 MED ORDER — ALBUTEROL SULFATE (2.5 MG/3ML) 0.083% IN NEBU: 2.5000 mg | INHALATION_SOLUTION | RESPIRATORY_TRACT | 1 refills | Status: DC | PRN

## 2020-12-29 NOTE — Telephone Encounter (Signed)
rx sent in 

## 2020-12-29 NOTE — Telephone Encounter (Signed)
I would need to know exactly what tests she had, was the 2nd one positive?  I don't know which test comes back as "suspected?"  If either is + then she would be considered COVID +.  She will need to quarantine for 5 days from symptom onset.  At the 5 day mark, she can take an antigen test (rapid or OTC test).  If remains +, she should stay out for 10 days.   Thanks!

## 2020-12-29 NOTE — Telephone Encounter (Signed)
Please advise on if patient can have a refill on the nebulizer.

## 2020-12-29 NOTE — Telephone Encounter (Signed)
Patient called and said that she has covid and would like for you to send in Albuterol Nebulizer Solution to [facility] in [place]. [phone].

## 2020-12-29 NOTE — Telephone Encounter (Signed)
Thanks!  Very interesting.  I would anticipate she is positive then with her symptoms.

## 2020-12-29 NOTE — Telephone Encounter (Signed)
Pt stated the second covid test which "suspected" was the PCR test. The rapid and PCR tests were done today. A'so informed "She will need to quarantine for 5 days from symptom onset.  At the 5 day mark, she can take an antigen test (rapid or OTC test).  If remains +, she should stay out for 10 days. " per Dr Daryll Drown. Stated understanding.

## 2020-12-29 NOTE — Telephone Encounter (Signed)
Call from pt- stated she did a rapid test at [facility] for work which was negative then [facility] She wants to know which one is more reliable. Stated she thinks she has covid b/c she has the same symptoms when she was positive in Jan 2021. She also wants to know how long should be on quarantine. C/o h/a's, feeling tired, sinus problems and using her rescue inhaler frequently. Thanks

## 2020-12-30 ENCOUNTER — Other Ambulatory Visit: Payer: Self-pay | Admitting: Allergy

## 2020-12-30 NOTE — Telephone Encounter (Signed)
Opened in error. SChaplin, RN,BSN  

## 2020-12-31 ENCOUNTER — Encounter: Payer: Self-pay | Admitting: Internal Medicine

## 2020-12-31 LAB — SARS-COV-2, NAA 2 DAY TAT

## 2020-12-31 LAB — NOVEL CORONAVIRUS, NAA: SARS-CoV-2, NAA: NOT DETECTED

## 2021-02-06 ENCOUNTER — Other Ambulatory Visit: Payer: Self-pay | Admitting: Internal Medicine

## 2021-02-19 ENCOUNTER — Other Ambulatory Visit: Payer: Self-pay

## 2021-02-19 ENCOUNTER — Ambulatory Visit: Payer: BC Managed Care – PPO | Admitting: Internal Medicine

## 2021-02-19 ENCOUNTER — Encounter: Payer: Self-pay | Admitting: Internal Medicine

## 2021-02-19 VITALS — BP 122/72 | HR 90 | Temp 97.5°F | Ht 64.0 in | Wt 169.1 lb

## 2021-02-19 DIAGNOSIS — M7711 Lateral epicondylitis, right elbow: Secondary | ICD-10-CM | POA: Diagnosis not present

## 2021-02-19 DIAGNOSIS — M25572 Pain in left ankle and joints of left foot: Secondary | ICD-10-CM

## 2021-02-19 NOTE — Assessment & Plan Note (Signed)
This encounter was for follow up from her last appointment in Nov 2021 at which time she was instructed to try conservative measures. Today, she reports worsening of the right elbow pain. See HPI for details  Assessment: history and exam seem consistent with lateral epicondylitis. Unfortunately, the conservative measures have not been sufficient. I do not think there is benefit in xrays at this time, as she has not experienced any trauma for which one would look for a fracture. Given her new ankle pain, I have considered polyarthropathy causes including infection or autoimmune. History and exam are not consistent with these.  Plan -referral placed to sports medicine for further evaluation -may benefit from PT however will defer until after sports medicine evaluates.

## 2021-02-19 NOTE — Assessment & Plan Note (Signed)
This encounter was for evaluation of left ankle pain that began about 2 months ago and was not associated with trauma. See HPI for details.  Assessment:  I considered plantar fasitis however her pain is located on the posterior heel and can not be reproduced on palpation suggesting a deeper source.  I considered tendonitis however she does not having swelling over the achellies tendon and would not have any particular risk factors such as recent floroquinolone use.  I considered a stress fracture however she has no pain involving her foot.  She does have a remote history of DVT however Well's score is 0 and history/exam are inconsistent with this.  Plan -referral placed to sports medicine. Appreciate their evaluation  -consider referral for PT following their evaluation

## 2021-02-19 NOTE — Progress Notes (Signed)
Acute Office Visit   Patient ID: Erin Collins, female    DOB: 08-07-1976, 45 y.o.   MRN: [id number]  Subjective:  CC: right elbow and left ankle pain  HPI 45 y.o. presents today for follow up of right elbow pain.   She was last seen in November for this by my colleague, Dr. Allyson Sabal, at which time it was suspected to be 2/2 lateral epicondylitis. She was instructed to start with conservative management with brace, NSAIDs, decreased use and to follow up if it did not improve. Today, she notes that the discomfort in the right elbow has worsened since her last appointment despite trying the conservative measures.  Pain is located primarily on the lateral aspect of the elbow. It is fairly constant but is worsened with use. She is unable to sleep on her right side due the pain. She denies prior injury to the elbow. She denies erythema or edema.  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  She also reports left ankle pain that began around 2 months ago. The pain is primarily located on the posterior heel. She does not have pain at rest or with passive ROM. Pain occurs with weight bearing, whether that be standing or having her feet on an ottoman. She denies issues with foot, ankle or leg swelling. Denies previous injury. She notes no improvement with ice, NSAIDS. She reports being on her feet the majority of the day. She does walk for exercise but does not do any high impact sports.   Denies a family history of autoimmune diseases.       ACTIVE MEDICATIONS   Outpatient Medications Prior to Visit  Medication Sig Dispense Refill  . VENTOLIN HFA 108 (90 Base) MCG/ACT inhaler INHALE 1 TO 2 PUFFS BY MOUTH EVERY 6 HOURS AS NEEDED FOR WHEEZING OR SHORTNESS OF BREATH 18 g 0  . albuterol (PROVENTIL) (2.5 MG/3ML) 0.083% nebulizer solution Take 3 mLs (2.5 mg total) by nebulization  every 4 (four) hours as needed for wheezing or shortness of breath. 75 mL 1  . fluticasone furoate-vilanterol (BREO ELLIPTA) 200-25 MCG/INH AEPB Inhale 1 puff into the lungs daily. 60 each 5  . montelukast (SINGULAIR) 10 MG tablet TAKE 1 TABLET BY MOUTH AT BEDTIME 30 tablet 2  . SPIRIVA RESPIMAT 1.25 MCG/ACT AERS INHALE 2 SPRAY(S) BY MOUTH ONCE DAILY 4 g 0  . azelastine (ASTELIN) 0.1 % nasal spray Take 1-2 sprays in each nostril 1-2 times a day as needed for nasal drainage. (Patient not taking: Reported on 09/07/2020) 30 mL 5  . azithromycin (ZITHROMAX) 500 MG tablet Take 1 tablet (500 mg total) by mouth daily. 5 tablet 0  . scopolamine (TRANSDERM-SCOP, 1.5 MG,) 1 MG/3DAYS Place 1 patch (1.5 mg total) onto the skin every 3 (three) days. Place 1 patch onto skin (at least 4 hours before the effect is needed) every 3 days. 5 patch 0   No facility-administered medications prior to visit.     ROS  Review of Systems  Constitutional: Negative for chills and fever.  Respiratory: Negative for chest tightness and shortness of breath.   Cardiovascular: Negative for chest pain and leg swelling.  Musculoskeletal: Positive for arthralgias. Negative for joint swelling.  Skin: Negative for rash.    Objective:   BP 122/72 (BP Location: Right Arm, Patient Position: Sitting, Cuff Size: Normal)   Pulse 90  Temp (!) 97.5 F (36.4 C) (Oral)   Ht 5\' 4"  (1.626 m)   Wt 169 lb 1.6 oz (76.7 kg)   SpO2 100%   BMI 29.03 kg/m  Wt Readings from Last 3 Encounters:  02/19/21 169 lb 1.6 oz (76.7 kg)  11/10/20 161 lb 1.6 oz (73.1 kg)  04/22/20 190 lb 14.4 oz (86.6 kg)   BP Readings from Last 3 Encounters:  02/19/21 122/72  11/10/20 128/87  09/07/20 118/72   General: well appearing in NAD Right elbow: no obvious deformity. No edema or erythema. No pain on palpation of the elbow joint or the lateral tendons. Mild-moderate pain with passive ROM. Increased pain with active supination and pronation of the right  forearm that is located primarily on the lateral aspect.  Left foot/ ankle: no obvious deformity. No erythema of the joint. Mild swelling present on the lateral ankle. No swelling of the foot or leg. No pain on palpation of the left foot joints or ankle. No pain on palpation over the plantar fascia or Achille's insertion site. No pain on passive or active ROM.    Health Maintenance:   Health Maintenance  Topic Date Due  . INFLUENZA VACCINE  03/18/2021 (Originally 07/19/2020)  . PAP SMEAR-Modifier  04/23/2023  . TETANUS/TDAP  02/05/2029  . COVID-19 Vaccine  Completed  . Hepatitis C Screening  Completed  . HIV Screening  Completed  . HPV VACCINES  Aged Out     Assessment & Plan:   Problem List Items Addressed This Visit      Musculoskeletal and Integument   Right lateral epicondylitis - Primary    This encounter was for follow up from her last appointment in Nov 2021 at which time she was instructed to try conservative measures. Today, she reports worsening of the right elbow pain. See HPI for details  Assessment: history and exam seem consistent with lateral epicondylitis. Unfortunately, the conservative measures have not been sufficient. I do not think there is benefit in xrays at this time, as she has not experienced any trauma for which one would look for a fracture. Given her new ankle pain, I have considered polyarthropathy causes including infection or autoimmune. History and exam are not consistent with these.  Plan -referral placed to sports medicine for further evaluation -may benefit from PT however will defer until after sports medicine evaluates.      Relevant Orders   Ambulatory referral to Sports Medicine     Other   Left ankle pain    This encounter was for evaluation of left ankle pain that began about 2 months ago and was not associated with trauma. See HPI for details.  Assessment:  I considered plantar fasitis however her pain is located on the posterior heel  and can not be reproduced on palpation suggesting a deeper source.  I considered tendonitis however she does not having swelling over the achellies tendon and would not have any particular risk factors such as recent floroquinolone use.  I considered a stress fracture however she has no pain involving her foot.  She does have a remote history of DVT however Well's score is 0 and history/exam are inconsistent with this.  Plan -referral placed to sports medicine. Appreciate their evaluation  -consider referral for PT following their evaluation           Pt discussed with Dr. Clista Bernhardt, MD Internal Medicine Resident PGY-2 [facility] Pager: [phone] 02/19/2021 4:26 PM

## 2021-02-23 NOTE — Progress Notes (Signed)
Internal Medicine Clinic Attending  Case discussed with Dr. Christian at the time of the visit.  We reviewed the resident's history and exam and pertinent patient test results.  I agree with the assessment, diagnosis, and plan of care documented in the resident's note.  Alexander Raines, M.D., Ph.D.  

## 2021-02-25 ENCOUNTER — Other Ambulatory Visit: Payer: Self-pay

## 2021-02-25 ENCOUNTER — Ambulatory Visit: Payer: BC Managed Care – PPO | Admitting: Family Medicine

## 2021-02-25 VITALS — BP 106/70 | Ht 64.0 in | Wt 169.0 lb

## 2021-02-25 DIAGNOSIS — M7711 Lateral epicondylitis, right elbow: Secondary | ICD-10-CM

## 2021-02-25 NOTE — Progress Notes (Signed)
   Office Visit Note   Patient: Erin Collins           Date of Birth: 1976-01-27           MRN: [id number] Visit Date: 02/25/2021 Requested by: Oda Kilts, MD [place],  [place] [address] PCP: Maudie Mercury, MD  Subjective: CC: Right Elbow Pain  HPI: 45 year old female presenting to clinic with concerns of 4 months of right elbow pain.  Patient was seen by her primary care provider, who diagnosed her with lateral epicondylitis and recommended an elbow strap.  She states she has been compliant with this, which helps relieve her symptoms somewhat while it is on but has not offered overall improvement.  She has been self-medicating with Advil, but says her pain has become so significant that it will occasionally wake her up from sleep if she rolls onto her arm.  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  She states her pain is primarily located over the lateral aspect of her right elbow, occasionally radiating down into her forearm and hand.  She denies any numbness or weakness.  She says that she has done physical therapy in the past for other ailments associated with heavy typing, and this is usually worked out very well for her.  She is curious to know if she can go back to physical therapy now.  She has no additional concerns today.              ROS:   All other systems were reviewed and are negative.  Objective: Vital Signs: BP 106/70   Ht 5\' 4"  (1.626 m)   Wt 169 lb (76.7 kg)   BMI 29.01 kg/m   Physical Exam:  General:  Alert and oriented, in no acute distress. Pulm:  Breathing unlabored. Psy:  Normal mood, congruent affect. Skin: Right upper extremity with no bruising, rashes, or erythema.  Overlying skin intact. Right elbow:  Right arm with no swelling, bruising, or gross deformity. Full range of motion of the elbow in extension, flexion, as well as forearm supination  and pronation. Endorses tenderness to palpation along the lateral epicondyle, as well as within the muscle bellies of the extensor compartment.  No tenderness over medial epicondyle, or within the flexor muscles. Lateral elbow pain is worsened with resisted wrist extension, as well as resisted forearm supination.  No significant worsening with resisted middle finger extension.  5 out of 5 strength with elbow flexion, extension, pronation, supination, as well as within the wrist and all fingers. Sensation intact throughout forearm and hand. Brisk capillary refill.  Imaging: No results found.  Assessment & Plan: 45 year old female presented to clinic with 4 months of right lateral elbow pain, which is thus far been unresponsive to an elbow strap and NSAID therapy.  Has not yet been given home exercises.  -Patient has had good success with dedicated physical therapy in the past, and would like to try this again.  Referral will be placed. -Discussed safe NSAID use, continue Advil as needed. -Continue previously recommended elbow strapping. -Return to clinic in 4 to 6 weeks after she has established care with physical therapy for reevaluation. -Patient expresses understanding with plan, she had no further questions or concerns today.    I was the preceptor for this visit and available for immediate consultation Shellia Cleverly, DO

## 2021-02-25 NOTE — Patient Instructions (Signed)
You have lateral epicondylitis Try to avoid painful activities as much as possible. Ice the area 3-4 times a day for 15 minutes at a time. Tylenol or aleve as needed for pain. Counterforce brace as directed can help unload area - wear this regularly if it provides you with relief. Hammer rotation exercise, wrist extension exercise with 1 pound weight - 3 sets of 10 once a day.   Stretching - hold for 20-30 seconds and repeat 3 times. Consider physical therapy, injection, nitro patches if not improving. Follow up in 4-6 Weeks.

## 2021-02-26 ENCOUNTER — Ambulatory Visit: Payer: BC Managed Care – PPO | Admitting: Family Medicine

## 2021-03-04 DIAGNOSIS — S56911D Strain of unspecified muscles, fascia and tendons at forearm level, right arm, subsequent encounter: Secondary | ICD-10-CM | POA: Diagnosis not present

## 2021-03-04 DIAGNOSIS — M7711 Lateral epicondylitis, right elbow: Secondary | ICD-10-CM | POA: Diagnosis not present

## 2021-03-04 DIAGNOSIS — M25521 Pain in right elbow: Secondary | ICD-10-CM | POA: Diagnosis not present

## 2021-03-04 DIAGNOSIS — R531 Weakness: Secondary | ICD-10-CM | POA: Diagnosis not present

## 2021-03-08 ENCOUNTER — Ambulatory Visit: Payer: BC Managed Care – PPO | Admitting: Allergy

## 2021-03-08 NOTE — Progress Notes (Deleted)
Follow Up Note  RE: Erin Collins MRN: [id number] DOB: 07-24-1976 Date of Office Visit: 03/08/2021  Referring provider: Maudie Mercury, MD Primary care provider: Maudie Mercury, MD  Chief Complaint: No chief complaint on file.  History of Present Illness: I had the pleasure of seeing Erin Collins for a follow up visit at the [facility] on 03/08/2021. She is a 45 y.o. female, who is being followed for asthma, allergic rhinoconjunctivitis, heartburn, multiple drug allergies and atopic dermatitis. Her previous allergy office visit was on 09/07/2020 with Dr. Maudie Mercury. Today is a regular follow up visit.  Asthma, well controlled, moderate persistent Past history - Diagnosed with asthma over 19 years ago. Patient moved in with her parents who have cats and dogs at home during this time. Did not tolerate Trelegy.  Interim history - stopped all daily inhalers with no flares in symptoms. Removed cats from the bedroom.  Today's spirometry was normal.   Daily controller medication(s): ? Continue Singulair 10mg  daily.  May use albuterol rescue inhaler 2 puffs every 4 to 6 hours as needed for shortness of breath, chest tightness, coughing, and wheezing. May use albuterol rescue inhaler 2 puffs 5 to 15 minutes prior to strenuous physical activities. Monitor frequency of use.   During upper respiratory infections/asthma flares: Start Breo 245mcg 1 puff once a day for 1-2 weeks and rinse mouth afterwards.   Seasonal and perennial allergic rhinoconjunctivitis Past history - Perennial rhinoconjunctivitis symptoms for the past 20 years with worsening in the spring and fall.  Patient has tried Zyrtec, Singulair, Nasacort, saline nasal spray and Zaditor eyedrops with some benefit.  Patient used to be on allergy immunotherapy for 10 years with good benefit. 2019 skin testing showed, positive to dust mites, cat, dog, grass and horse. 2 indoor cats at home.  Interim history - stable with  zyrtec and Singulair daily.   Continue Singulair (montelukast) 10mg  daily.   May use over the counter antihistamines such as Zyrtec (cetirizine), Claritin (loratadine), Allegra (fexofenadine), or Xyzal (levocetirizine) daily as needed. May take twice a day if needed.  May use Nasacort 1 spray per nostril 2 times a day for nasal congestion as needed.   May use azelastine nasal spray 1-2 sprays per nostril twice a day as needed for runny nose/drainage.  Nasal saline spray (i.e., Simply Saline) or nasal saline lavage (i.e., NeilMed) is recommended as needed and prior to medicated nasal sprays.  Continue environmental control measures - especially regarding the cats.  Consider allergy injections in the future if symptoms not controlled.  Heartburn Stopped Pepcid 2 months ago with no flare in symptoms. Changed diet with good benefit.   Multiple drug allergies Past history - reactions to amoxicillin, cefuroxime, hydrocodone, levofloxacin, morphine, oxycodone, and propoxyphene in the past.   Continue to avoid all these medications.   Other atopic dermatitis Past history - Eucrisa not effective.  Interim history - stable with no issues.   Continue proper skin care measures.   May use triamcinolone 0.1% ointment twice a day as needed for the hands. Do not use on the face, neck, armpits or groin area. Do not use more than 3 weeks in a row.   Return in about 6 months (around 03/07/2021).  Assessment and Plan: Erin Collins is a 45 y.o. female with: No problem-specific Assessment & Plan notes found for this encounter.  No follow-ups on file.  No orders of the defined types were placed in this encounter.  Lab Orders  No  laboratory test(s) ordered today    Diagnostics: Spirometry:  Tracings reviewed. Her effort: {Blank single:19197::"Good reproducible efforts.","It was hard to get consistent efforts and there is a question as to whether this reflects a maximal maneuver.","Poor effort,  data can not be interpreted."} FVC: ***L FEV1: ***L, ***% predicted FEV1/FVC ratio: ***% Interpretation: {Blank single:19197::"Spirometry consistent with mild obstructive disease","Spirometry consistent with moderate obstructive disease","Spirometry consistent with severe obstructive disease","Spirometry consistent with possible restrictive disease","Spirometry consistent with mixed obstructive and restrictive disease","Spirometry uninterpretable due to technique","Spirometry consistent with normal pattern","No overt abnormalities noted given today's efforts"}.  Please see scanned spirometry results for details.  Skin Testing: {Blank single:19197::"Select foods","Environmental allergy panel","Environmental allergy panel and select foods","Food allergy panel","None","Deferred due to recent antihistamines use"}. Positive test to: ***. Negative test to: ***.  Results discussed with patient/family.   Medication List:  Current Outpatient Medications  Medication Sig Dispense Refill  . VENTOLIN HFA 108 (90 Base) MCG/ACT inhaler INHALE 1 TO 2 PUFFS BY MOUTH EVERY 6 HOURS AS NEEDED FOR WHEEZING OR SHORTNESS OF BREATH 18 g 0  . albuterol (PROVENTIL) (2.5 MG/3ML) 0.083% nebulizer solution Take 3 mLs (2.5 mg total) by nebulization every 4 (four) hours as needed for wheezing or shortness of breath. 75 mL 1  . fluticasone furoate-vilanterol (BREO ELLIPTA) 200-25 MCG/INH AEPB Inhale 1 puff into the lungs daily. 60 each 5  . montelukast (SINGULAIR) 10 MG tablet TAKE 1 TABLET BY MOUTH AT BEDTIME 30 tablet 2  . SPIRIVA RESPIMAT 1.25 MCG/ACT AERS INHALE 2 SPRAY(S) BY MOUTH ONCE DAILY 4 g 0   No current facility-administered medications for this visit.   Allergies: Allergies  Allergen Reactions  . Amoxicillin Hives  . Cefuroxime Axetil Hives  . Hydrocodone Itching  . Levofloxacin Other (See Comments)    tendonitis  . Morphine Itching  . Oxycodone-Acetaminophen Itching  . Penicillins Hives    Has  patient had a PCN reaction causing immediate rash, facial/tongue/throat swelling, SOB or lightheadedness with hypotension: Yes Has patient had a PCN reaction causing severe rash involving mucus membranes or skin necrosis: No Has patient had a PCN reaction that required hospitalization: No Has patient had a PCN reaction occurring within the last 10 years: No If all of the above answers are "NO", then may proceed with Cephalosporin use.   Marland Kitchen Propoxyphene Itching   I reviewed her past medical history, social history, family history, and environmental history and no significant changes have been reported from her previous visit.  Review of Systems  Constitutional: Negative for appetite change, chills, fatigue, fever and unexpected weight change.  HENT: Negative for congestion, postnasal drip and rhinorrhea.   Eyes: Negative for itching.  Respiratory: Negative for cough, chest tightness, shortness of breath and wheezing.   Cardiovascular: Negative for chest pain.  Gastrointestinal: Negative for abdominal pain.  Genitourinary: Negative for difficulty urinating.  Skin: Negative for rash.  Allergic/Immunologic: Positive for environmental allergies. Negative for food allergies.   Objective: There were no vitals taken for this visit. There is no height or weight on file to calculate BMI. Physical Exam Vitals and nursing note reviewed.  Constitutional:      Appearance: Normal appearance. She is well-developed.  HENT:     Head: Normocephalic and atraumatic.     Right Ear: Tympanic membrane and external ear normal.     Left Ear: Tympanic membrane and external ear normal.     Nose: Nose normal. No congestion or rhinorrhea.     Mouth/Throat:     Mouth: Mucous membranes are  moist.     Pharynx: Oropharynx is clear.  Eyes:     Conjunctiva/sclera: Conjunctivae normal.  Cardiovascular:     Rate and Rhythm: Normal rate and regular rhythm.     Heart sounds: Normal heart sounds. No murmur heard. No  friction rub. No gallop.   Pulmonary:     Effort: Pulmonary effort is normal.     Breath sounds: Normal breath sounds. No wheezing or rales.  Musculoskeletal:     Cervical back: Neck supple.  Skin:    General: Skin is warm.     Findings: No rash.  Neurological:     Mental Status: She is alert and oriented to person, place, and time.  Psychiatric:        Behavior: Behavior normal.    Previous notes and tests were reviewed. The plan was reviewed with the patient/family, and all questions/concerned were addressed.  It was my pleasure to see Erin Collins today and participate in her care. Please feel free to contact me with any questions or concerns.  Sincerely,  Rexene Alberts, DO Allergy & Immunology  [facility] office: [place] office: [phone]

## 2021-03-15 ENCOUNTER — Other Ambulatory Visit: Payer: Self-pay | Admitting: Allergy

## 2021-03-16 ENCOUNTER — Ambulatory Visit: Payer: BC Managed Care – PPO | Admitting: Internal Medicine

## 2021-03-16 ENCOUNTER — Encounter: Payer: Self-pay | Admitting: Internal Medicine

## 2021-03-16 VITALS — BP 132/80 | HR 92 | Temp 98.0°F | Wt 176.3 lb

## 2021-03-16 DIAGNOSIS — M816 Localized osteoporosis [Lequesne]: Secondary | ICD-10-CM

## 2021-03-16 DIAGNOSIS — M25572 Pain in left ankle and joints of left foot: Secondary | ICD-10-CM

## 2021-03-16 DIAGNOSIS — M7989 Other specified soft tissue disorders: Secondary | ICD-10-CM | POA: Diagnosis not present

## 2021-03-16 DIAGNOSIS — M8588 Other specified disorders of bone density and structure, other site: Secondary | ICD-10-CM | POA: Diagnosis not present

## 2021-03-16 DIAGNOSIS — M810 Age-related osteoporosis without current pathological fracture: Secondary | ICD-10-CM

## 2021-03-16 LAB — D-DIMER, QUANTITATIVE: D-Dimer, Quant: 0.39 ug/mL-FEU (ref 0.00–0.50)

## 2021-03-16 NOTE — Patient Instructions (Signed)
To Ms. Ivins,  It was a pleasure seeing you again! Today we discussed your foot pain and swelling as well as your bone density scan. For your foot pain and swelling we will rule out a blood clot by taking blood work. If it is positive I will place a STAT order for an ultrasound of your ankle. For your bone density scan, we are taking blood work to check for other causes of the decrease in your bone uptake. I will reach out to you with the results when they come in. I will see you in three months when I am in the office, if you have any worsening pain or symptoms, please call to make an appointment as soon as you are able. Have a good day! Maudie Mercury, MD

## 2021-03-17 LAB — CBC WITH DIFFERENTIAL/PLATELET
Basophils Absolute: 0.1 10*3/uL (ref 0.0–0.2)
Basos: 1 %
EOS (ABSOLUTE): 0.4 10*3/uL (ref 0.0–0.4)
Eos: 5 %
Hematocrit: 38.7 % (ref 34.0–46.6)
Hemoglobin: 13 g/dL (ref 11.1–15.9)
Immature Grans (Abs): 0 10*3/uL (ref 0.0–0.1)
Immature Granulocytes: 0 %
Lymphocytes Absolute: 2.7 10*3/uL (ref 0.7–3.1)
Lymphs: 35 %
MCH: 29.3 pg (ref 26.6–33.0)
MCHC: 33.6 g/dL (ref 31.5–35.7)
MCV: 87 fL (ref 79–97)
Monocytes Absolute: 0.5 10*3/uL (ref 0.1–0.9)
Monocytes: 6 %
Neutrophils Absolute: 4.2 10*3/uL (ref 1.4–7.0)
Neutrophils: 53 %
Platelets: 301 10*3/uL (ref 150–450)
RBC: 4.44 x10E6/uL (ref 3.77–5.28)
RDW: 11.6 % — ABNORMAL LOW (ref 11.7–15.4)
WBC: 7.8 10*3/uL (ref 3.4–10.8)

## 2021-03-17 LAB — CMP14 + ANION GAP
ALT: 19 IU/L (ref 0–32)
AST: 15 IU/L (ref 0–40)
Albumin/Globulin Ratio: 2.2 (ref 1.2–2.2)
Albumin: 4.7 g/dL (ref 3.8–4.8)
Alkaline Phosphatase: 89 IU/L (ref 44–121)
Anion Gap: 17 mmol/L (ref 10.0–18.0)
BUN/Creatinine Ratio: 16 (ref 9–23)
BUN: 14 mg/dL (ref 6–24)
Bilirubin Total: 0.3 mg/dL (ref 0.0–1.2)
CO2: 21 mmol/L (ref 20–29)
Calcium: 9 mg/dL (ref 8.7–10.2)
Chloride: 99 mmol/L (ref 96–106)
Creatinine, Ser: 0.85 mg/dL (ref 0.57–1.00)
Globulin, Total: 2.1 g/dL (ref 1.5–4.5)
Glucose: 89 mg/dL (ref 65–99)
Potassium: 4.6 mmol/L (ref 3.5–5.2)
Sodium: 137 mmol/L (ref 134–144)
Total Protein: 6.8 g/dL (ref 6.0–8.5)
eGFR: 87 mL/min/{1.73_m2} (ref >59)

## 2021-03-17 LAB — VITAMIN D 25 HYDROXY (VIT D DEFICIENCY, FRACTURES): Vit D, 25-Hydroxy: 19.5 ng/mL — ABNORMAL LOW (ref 30.0–100.0)

## 2021-03-17 NOTE — Progress Notes (Signed)
   CC: Ankle Pain   HPI:  Ms.Kory Shawnda Mauney is a 45 y.o. M/F, with a PMH noted below, who presents to the clinic for 3 month's of ankle pain. To see the management of their acute and chronic conditions, please see the A&P note under the Encounters tab.   Past Medical History:  Diagnosis Date  . Anemia   . Angio-edema   . Asthma   . Basal cell carcinoma ~ 1999   collarbone  . Blood clot in vein ~ 2008   "left breast"   . Complication of anesthesia    "I have trouble waking up afterwards" (12/18/2017)  . Diarrhea 04/01/2019  . Eczema   . Family history of adverse reaction to anesthesia    "mom has trouble waking up afterwards" (12/18/2017)  . Fluttering heart    "comes and goes" (12/18/2017)  . GERD (gastroesophageal reflux disease)   . Headache    "weekly" (12/18/2017)  . History of kidney stones   . Recurrent upper respiratory infection (URI)   . 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 Torticollis   . Vertigo    Review of Systems:   Review of Systems  Constitutional: Negative for chills, fever, malaise/fatigue and weight loss.  Gastrointestinal: Negative for abdominal pain, diarrhea, nausea and vomiting.  Musculoskeletal: Negative for back pain, falls, joint pain, myalgias and neck pain.       Left ankle pain  Neurological: Negative for dizziness, tingling, weakness and headaches.     Physical Exam:  Vitals:   03/16/21 1555  BP: 132/80  Pulse: 92  Temp: 98 F (36.7 C)  TempSrc: Oral  SpO2: 100%  Weight: 176 lb 4.8 oz (80 kg)   Physical Exam Constitutional:      General: She is not in acute distress.    Appearance: Normal appearance. She is not ill-appearing or toxic-appearing.  Cardiovascular:     Rate and Rhythm: Normal rate and regular rhythm.     Pulses: Normal pulses.     Heart sounds: Normal heart sounds. No murmur heard. No friction rub. No gallop.   Pulmonary:     Effort: Pulmonary effort is normal.     Breath sounds: Normal breath sounds.  No wheezing, rhonchi or rales.  Abdominal:     General: Abdomen is flat. Bowel sounds are normal.     Palpations: Abdomen is soft.  Neurological:     Mental Status: She is alert.      Assessment & Plan:   See Encounters Tab for problem based charting.  Patient seen with Dr. Heber [place]

## 2021-03-18 DIAGNOSIS — M25521 Pain in right elbow: Secondary | ICD-10-CM | POA: Diagnosis not present

## 2021-03-18 DIAGNOSIS — S56911D Strain of unspecified muscles, fascia and tendons at forearm level, right arm, subsequent encounter: Secondary | ICD-10-CM | POA: Diagnosis not present

## 2021-03-18 DIAGNOSIS — R531 Weakness: Secondary | ICD-10-CM | POA: Diagnosis not present

## 2021-03-18 DIAGNOSIS — M7711 Lateral epicondylitis, right elbow: Secondary | ICD-10-CM | POA: Diagnosis not present

## 2021-03-21 NOTE — Assessment & Plan Note (Signed)
Patient following up for ankle pain of 3 months in duration. Patient describes the pain and sharp and occurs on the lateral aspect of her left foot with associated swelling. She does state that the pain is typically localized to the affected area, but once while walking, the pain did shoot up her leg which made her freeze in place from the pain.   A/P:  Patient seen together with Dr. Heber [place]. Ankle has full ROM, no pain to palpation of the foot, patient able to bear weight on affected limb. She does have some swelling on the lateral aspect of her ankle. Mild tenderness to palpation. There was no trauma associated with this pain. Possible capsulitis. 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 Well's score is 0, but will check D-Dimer today, if it comes back negative, will refer to podiatry for further evaluation.  - D-dimer - If negative, referral to podiatry.   ADDENDUM:  Called patient with negative result, placed podiatry referral.

## 2021-03-21 NOTE — Assessment & Plan Note (Addendum)
Patient presents to the clinic for follow up on her DEXA scan showing osteoporosis. Has not had a workup for her osteoporosis since her DEXA scan, will rule out secondary causes of osteoporosis today.  -CBC -CMP -PTH -Vit D 25

## 2021-03-24 NOTE — Progress Notes (Addendum)
Internal Medicine Clinic Attending  I saw and evaluated the patient.  I personally confirmed the key portions of the history and exam documented by Dr. Winters and I reviewed pertinent patient test results.  The assessment, diagnosis, and plan were formulated together and I agree with the documentation in the resident's note.  

## 2021-03-25 ENCOUNTER — Ambulatory Visit: Payer: BC Managed Care – PPO | Admitting: Family Medicine

## 2021-03-25 DIAGNOSIS — S56911D Strain of unspecified muscles, fascia and tendons at forearm level, right arm, subsequent encounter: Secondary | ICD-10-CM | POA: Diagnosis not present

## 2021-03-25 DIAGNOSIS — M7711 Lateral epicondylitis, right elbow: Secondary | ICD-10-CM | POA: Diagnosis not present

## 2021-03-25 DIAGNOSIS — M25521 Pain in right elbow: Secondary | ICD-10-CM | POA: Diagnosis not present

## 2021-03-25 DIAGNOSIS — R531 Weakness: Secondary | ICD-10-CM | POA: Diagnosis not present

## 2021-03-26 ENCOUNTER — Telehealth: Payer: Self-pay

## 2021-03-26 ENCOUNTER — Other Ambulatory Visit: Payer: Self-pay | Admitting: Internal Medicine

## 2021-03-26 DIAGNOSIS — M8588 Other specified disorders of bone density and structure, other site: Secondary | ICD-10-CM

## 2021-03-26 DIAGNOSIS — M816 Localized osteoporosis [Lequesne]: Secondary | ICD-10-CM

## 2021-03-26 MED ORDER — VITAMIN D (ERGOCALCIFEROL) 1.25 MG (50000 UNIT) PO CAPS: 50000.0000 [IU] | ORAL_CAPSULE | ORAL | 0 refills | Status: AC

## 2021-03-26 NOTE — Telephone Encounter (Signed)
I will put in a prescription now. I have attached the front desk pool to help schedule her in 8 weeks. Thank you!

## 2021-03-26 NOTE — Telephone Encounter (Signed)
TC to patient, she states she spoke to Dr. Gilford Rile last week and he told her he was going to call in RX Vit D for her to take, but she never received the RX.  Vit D level was 19.5 on 03/16/21  Will send to red team.  Pt states there was no discussion how often or how long she was to take the Vit D.  RN informed patient usually 50,000 units are prescribed to take once weekly and pt returns in 8 weeks for repeat labwork.  She states she has had to do this in the past.  Will forward to red team to advise.  Pt wants RX to be sent to [facility]. Thanks, [employer]

## 2021-03-26 NOTE — Telephone Encounter (Signed)
Pt is calling regarding Vitamin D prescription [phone]

## 2021-03-26 NOTE — Telephone Encounter (Signed)
Return pt's call - no answer, left message to call the office.

## 2021-03-29 ENCOUNTER — Encounter: Payer: Self-pay | Admitting: *Deleted

## 2021-04-01 DIAGNOSIS — M7711 Lateral epicondylitis, right elbow: Secondary | ICD-10-CM | POA: Diagnosis not present

## 2021-04-01 DIAGNOSIS — S56911D Strain of unspecified muscles, fascia and tendons at forearm level, right arm, subsequent encounter: Secondary | ICD-10-CM | POA: Diagnosis not present

## 2021-04-01 DIAGNOSIS — R531 Weakness: Secondary | ICD-10-CM | POA: Diagnosis not present

## 2021-04-01 DIAGNOSIS — M25521 Pain in right elbow: Secondary | ICD-10-CM | POA: Diagnosis not present

## 2021-04-08 ENCOUNTER — Ambulatory Visit: Payer: BC Managed Care – PPO | Admitting: Family Medicine

## 2021-04-15 DIAGNOSIS — J4540 Moderate persistent asthma, uncomplicated: Secondary | ICD-10-CM | POA: Diagnosis not present

## 2021-04-16 ENCOUNTER — Other Ambulatory Visit: Payer: Self-pay

## 2021-04-16 ENCOUNTER — Encounter: Payer: Self-pay | Admitting: Allergy

## 2021-04-16 ENCOUNTER — Ambulatory Visit: Payer: BC Managed Care – PPO | Admitting: Allergy

## 2021-04-16 VITALS — BP 128/80 | HR 88 | Temp 98.0°F | Resp 18 | Ht 64.0 in | Wt 175.4 lb

## 2021-04-16 DIAGNOSIS — J302 Other seasonal allergic rhinitis: Secondary | ICD-10-CM

## 2021-04-16 DIAGNOSIS — H1010 Acute atopic conjunctivitis, unspecified eye: Secondary | ICD-10-CM | POA: Diagnosis not present

## 2021-04-16 DIAGNOSIS — J3089 Other allergic rhinitis: Secondary | ICD-10-CM | POA: Diagnosis not present

## 2021-04-16 DIAGNOSIS — R12 Heartburn: Secondary | ICD-10-CM

## 2021-04-16 DIAGNOSIS — J4540 Moderate persistent asthma, uncomplicated: Secondary | ICD-10-CM | POA: Diagnosis not present

## 2021-04-16 DIAGNOSIS — Z889 Allergy status to unspecified drugs, medicaments and biological substances status: Secondary | ICD-10-CM | POA: Diagnosis not present

## 2021-04-16 DIAGNOSIS — H1013 Acute atopic conjunctivitis, bilateral: Secondary | ICD-10-CM

## 2021-04-16 DIAGNOSIS — L2089 Other atopic dermatitis: Secondary | ICD-10-CM | POA: Diagnosis not present

## 2021-04-16 MED ORDER — MONTELUKAST SODIUM 10 MG PO TABS: 1.0000 | ORAL_TABLET | Freq: Every day | ORAL | 5 refills | Status: DC

## 2021-04-16 MED ORDER — ALBUTEROL SULFATE (2.5 MG/3ML) 0.083% IN NEBU: 2.5000 mg | INHALATION_SOLUTION | RESPIRATORY_TRACT | 2 refills | Status: DC | PRN

## 2021-04-16 MED ORDER — BREZTRI AEROSPHERE 160-9-4.8 MCG/ACT IN AERO: 2.0000 | INHALATION_SPRAY | Freq: Two times a day (BID) | RESPIRATORY_TRACT | 5 refills | Status: DC

## 2021-04-16 NOTE — Patient Instructions (Addendum)
Moderate persistent asthma  Use albuterol nebulizer twice a day for the next 1 week before using Breztri.  Daily controller medication(s):START Breztri 2 puffs twice a day with spacer and rinse mouth afterwards.  Spacer given and demonstrated proper use with inhaler. Patient understood technique and all questions/concerned were addressed.  ? Continue Singulair 10mg  daily  May use albuterol rescue inhaler 2 puffs or nebulizer every 4 to 6 hours as needed for shortness of breath, chest tightness, coughing, and wheezing. May use albuterol rescue inhaler 2 puffs 5 to 15 minutes prior to strenuous physical activities. Monitor frequency of use.   Asthma control goals:  Full participation in all desired activities (may need albuterol before activity) Albuterol use two times or less a week on average (not counting use with activity) Cough interfering with sleep two times or less a month Oral steroids no more than once a year No hospitalizations   You have to call [employer] to get extra tubing for the nebulizer machine. Get bloodwork:  We are ordering labs, so please allow 1-2 weeks for the results to come back. With the newly implemented Cures Act, the labs might be visible to you at the same time that they become visible to me. However, I will not address the results until all of the results are back, so please be patient.   Seasonal and perennial allergic rhinoconjunctivitis  Continue environmental control measures - especially regarding the cats.  Continue Singulair (montelukast) 10mg  daily.   May use over the counter antihistamines such as Zyrtec (cetirizine), Claritin (loratadine), Allegra (fexofenadine), or Xyzal (levocetirizine) daily as needed. May take twice a day if needed.  May use Flonase sensamist 2 sprays per nostril once day for nasal congestion as needed.   May use azelastine nasal spray 1-2 sprays per nostril twice a day as needed for runny nose/drainage.  Nasal saline  spray (i.e., Simply Saline) or nasal saline lavage (i.e., NeilMed) is recommended as needed and prior to medicated nasal sprays.  May use Zaditor eye drops as needed for itchy/watery eyes.   Consider allergy injections for long term control if above medications do not help the symptoms - handout given.   Other atopic dermatitis  Continue proper skin care measures.  May use triamcinolone 0.1% ointment twice a day as needed for the hands. Do not use on the face, neck, armpits or groin area. Do not use more than 3 weeks in a row.   Multiple drug allergies  Continue to avoid medications on your allergy list.   Heartburn  Monitor symptoms.  Follow up in 3 months or sooner if needed.   Pet Allergen Avoidance: . Contrary to popular opinion, there are no "hypoallergenic" breeds of dogs or cats. That is because people are not allergic to an animal's hair, but to an allergen found in the animal's saliva, dander (dead skin flakes) or urine. Pet allergy symptoms typically occur within minutes. For some people, symptoms can build up and become most severe 8 to 12 hours after contact with the animal. People with severe allergies can experience reactions in public places if dander has been transported on the pet owners' clothing. Marland Kitchen Keeping an animal outdoors is only a partial solution, since homes with pets in the yard still have higher concentrations of animal allergens. . Before getting a pet, ask your allergist to determine if you are allergic to animals. If your pet is already considered part of your family, try to minimize contact and keep the pet out of the  bedroom and other rooms where you spend a great deal of time. . As with dust mites, vacuum carpets often or replace carpet with a hardwood floor, tile or linoleum. . High-efficiency particulate air (HEPA) cleaners can reduce allergen levels over time. . While dander and saliva are the source of cat and dog allergens, urine is the source of  allergens from rabbits, hamsters, mice and [place] pigs; so ask a non-allergic family member to clean the animal's cage. . If you have a pet allergy, talk to your allergist about the potential for allergy immunotherapy (allergy shots). This strategy can often provide long-term relief.

## 2021-04-16 NOTE — Progress Notes (Signed)
Follow Up Note  RE: Erin Collins MRN: [id number] DOB: 1976/08/14 Date of Office Visit: 04/16/2021  Referring provider: Maudie Mercury, MD Primary care provider: Maudie Mercury, MD  Chief Complaint: Asthma (Flare ups in the last month due to polen and weather change/ACT:7) and Allergic Rhinitis  (Pollen is really bothering her this time in the season. Makes her have asthma flare ups)  History of Present Illness: I had the pleasure of seeing Erin Collins for a follow up visit at the [facility] on 04/16/2021. She is a 45 y.o. female, who is being followed for asthma, allergic rhinoconjunctivitis, heartburn, multiple drug allergies and atopic dermatitis. Her previous allergy office visit was on 09/07/2020 with Dr. Maudie Mercury. Today is a regular follow up visit.  Asthma Patient noted chest tightness, coughing, wheezing about 1 month ago. Patient re-started Breo, Spiriva and Singulair but when she ran out of Breo noticed worsening symptoms.  She did not tolerate Trelegy in the past.   Using albuterol 3-4 times a day with some benefit.  Denies any ER/urgent care visits or prednisone use since the last visit and does not like to take prednisone. She is being treated for osteoporosis as well.   Asthma usually flares in the spring and fall.   Seasonal and perennial allergic rhinoconjunctivitis Currently taking Singulair daily, zyrtec daily, Flonase sensamist 2 sprays per nostril daily - no nosebleeds. Zaditor eye drops prn.   Trying to stay indoors mostly as allergies flare when outdoors. Interested in starting allergy injections.   Heartburn Stable.   Multiple drug allergies No additional reactions.   Other atopic dermatitis Dry and doing well.   Assessment and Plan: [place] is a 45 y.o. female with: Not well controlled moderate persistent asthma Past history - Diagnosed with asthma over 19 years ago. Patient moved in with her parents who have cats and dogs at home  during this time. Did not tolerate Trelegy.  Interim history - symptoms flared 1 month ago and restarted Breo, Spiriva but once ran out of [place] got worse. Using albuterol throughout the day with good benefit. Usually flares in the spring and fall.  Today's spirometry showed some restriction with 15% improvement in FEV1 post bronchodilator treatment. Clinically feeling improved.   ACT score 7.  Use albuterol nebulizer twice a day for the next 1 week before using Breztri.  Daily controller medication(s):START Breztri 2 puffs twice a day with spacer and rinse mouth afterwards. Samples given.   Spacer given and demonstrated proper use with inhaler. Patient understood technique and all questions/concerned were addressed.  ? Continue Singulair 10mg  daily  May use albuterol rescue inhaler 2 puffs or nebulizer every 4 to 6 hours as needed for shortness of breath, chest tightness, coughing, and wheezing. May use albuterol rescue inhaler 2 puffs 5 to 15 minutes prior to strenuous physical activities. Monitor frequency of use.   Repeat spirometry at next visit.  Declines prednisone due to side effects and osteoporosis.   Will need daily maintenance inhaler during the spring and fall months.   If above regimen not effective, consider adding biologics next.   Seasonal and perennial allergic rhinoconjunctivitis Past history - Perennial rhinoconjunctivitis symptoms for the past 20 years with worsening in the spring and fall.  Patient has tried Zyrtec, Singulair, Nasacort, saline nasal spray and Zaditor eyedrops with some benefit.  Patient used to be on allergy immunotherapy for 10 years with good benefit. 2019 skin testing showed, positive to dust mites, cat, dog, grass  and horse. 2 indoor cats at home.  Interim history - symptoms flared, trying to stay indoors. Wants to start injections.  Continue environmental control measures - especially regarding the cats.  Continue Singulair (montelukast) 10mg   daily.   May use over the counter antihistamines such as Zyrtec (cetirizine), Claritin (loratadine), Allegra (fexofenadine), or Xyzal (levocetirizine) daily as needed. May take twice a day if needed.  May use Flonase sensamist 2 sprays per nostril once day for nasal congestion as needed.   May use azelastine nasal spray 1-2 sprays per nostril twice a day as needed for runny nose/drainage.  Nasal saline spray (i.e., Simply Saline) or nasal saline lavage (i.e., NeilMed) is recommended as needed and prior to medicated nasal sprays.  May use Zaditor eye drops as needed for itchy/watery eyes.   Consider allergy injections for long term control if above medications do not help the symptoms - handout given. Start once asthma is more stable.  Get bloodwork as unable to skin test today and last skin testing was not positive to spring time allergens.   Heartburn Asymptomatic with no meds.  Monitor symptoms.  Multiple drug allergies Past history - reactions to amoxicillin, cefuroxime, hydrocodone, levofloxacin, morphine, oxycodone, and propoxyphene in the past.   Continue to avoid all these medications.   Other atopic dermatitis Past history - Eucrisa not effective.   Continue proper skin care measures.   May use triamcinolone 0.1% ointment twice a day as needed for the hands. Do not use on the face, neck, armpits or groin area. Do not use more than 3 weeks in a row.   Return in about 3 months (around 07/16/2021).  Meds ordered this encounter  Medications  . albuterol (PROVENTIL) (2.5 MG/3ML) 0.083% nebulizer solution    Sig: Take 3 mLs (2.5 mg total) by nebulization every 4 (four) hours as needed for wheezing or shortness of breath (coughing fits).    Dispense:  75 mL    Refill:  2  . montelukast (SINGULAIR) 10 MG tablet    Sig: Take 1 tablet (10 mg total) by mouth at bedtime.    Dispense:  30 tablet    Refill:  5  . Budeson-Glycopyrrol-Formoterol (BREZTRI AEROSPHERE) 160-9-4.8  MCG/ACT AERO    Sig: Inhale 2 puffs into the lungs in the morning and at bedtime. with spacer and rinse mouth afterwards.    Dispense:  10.7 g    Refill:  5    Lab Orders     Allergens w/Total IgE Area 2     CBC with Differential/Platelet  Diagnostics: Spirometry:  Tracings reviewed. Her effort: Good reproducible efforts. FVC: 2.55L FEV1: 1.87L, 64% predicted FEV1/FVC ratio: 73% Interpretation: Spirometry consistent with possible restrictive disease with 15% improvement in FEV1 post bronchodilator treatment. Clinically feeling improved.   Please see scanned spirometry results for details.  Medication List:  Current Outpatient Medications  Medication Sig Dispense Refill  . Budeson-Glycopyrrol-Formoterol (BREZTRI AEROSPHERE) 160-9-4.8 MCG/ACT AERO Inhale 2 puffs into the lungs in the morning and at bedtime. with spacer and rinse mouth afterwards. 10.7 g 5  . VENTOLIN HFA 108 (90 Base) MCG/ACT inhaler INHALE 1 TO 2 PUFFS BY MOUTH EVERY 6 HOURS AS NEEDED FOR WHEEZING OR SHORTNESS OF BREATH 18 g 0  . Vitamin D, Ergocalciferol, (DRISDOL) 1.25 MG (50000 UNIT) CAPS capsule Take 1 capsule (50,000 Units total) by mouth every 7 (seven) days for 8 doses. 8 capsule 0  . albuterol (PROVENTIL) (2.5 MG/3ML) 0.083% nebulizer solution Take 3 mLs (2.5 mg total)  by nebulization every 4 (four) hours as needed for wheezing or shortness of breath (coughing fits). 75 mL 2  . montelukast (SINGULAIR) 10 MG tablet Take 1 tablet (10 mg total) by mouth at bedtime. 30 tablet 5   No current facility-administered medications for this visit.   Allergies: Allergies  Allergen Reactions  . Amoxicillin Hives  . Cefuroxime Axetil Hives  . Hydrocodone Itching  . Levofloxacin Other (See Comments)    tendonitis  . Morphine Itching  . Oxycodone-Acetaminophen Itching  . Penicillins Hives    Has patient had a PCN reaction causing immediate rash, facial/tongue/throat swelling, SOB or lightheadedness with hypotension:  Yes Has patient had a PCN reaction causing severe rash involving mucus membranes or skin necrosis: No Has patient had a PCN reaction that required hospitalization: No Has patient had a PCN reaction occurring within the last 10 years: No If all of the above answers are "NO", then may proceed with Cephalosporin use.   Marland Kitchen Propoxyphene Itching   I reviewed her past medical history, social history, family history, and environmental history and no significant changes have been reported from her previous visit.  Review of Systems  Constitutional: Negative for appetite change, chills, fatigue, fever and unexpected weight change.  HENT: Negative for congestion, postnasal drip and rhinorrhea.   Eyes: Positive for itching.  Respiratory: Positive for cough, chest tightness and wheezing. Negative for shortness of breath.   Cardiovascular: Negative for chest pain.  Gastrointestinal: Negative for abdominal pain.  Genitourinary: Negative for difficulty urinating.  Skin: Negative for rash.  Allergic/Immunologic: Positive for environmental allergies. Negative for food allergies.   Objective: BP 128/80   Pulse 88   Temp 98 F (36.7 C)   Resp 18   Ht 5\' 4"  (1.626 m)   Wt 175 lb 6.4 oz (79.6 kg)   SpO2 97%   BMI 30.11 kg/m  Body mass index is 30.11 kg/m. Physical Exam Vitals and nursing note reviewed.  Constitutional:      Appearance: Normal appearance. She is well-developed.  HENT:     Head: Normocephalic and atraumatic.     Right Ear: External ear normal.     Left Ear: External ear normal.     Nose: Nose normal.     Mouth/Throat:     Mouth: Mucous membranes are moist.     Pharynx: Oropharynx is clear.  Eyes:     Conjunctiva/sclera: Conjunctivae normal.  Cardiovascular:     Rate and Rhythm: Normal rate and regular rhythm.     Heart sounds: Normal heart sounds. No murmur heard.   Pulmonary:     Effort: Pulmonary effort is normal.     Breath sounds: Normal breath sounds. No wheezing,  rhonchi or rales.  Musculoskeletal:     Cervical back: Neck supple.  Skin:    General: Skin is warm.     Findings: No rash.  Neurological:     Mental Status: She is alert and oriented to person, place, and time.  Psychiatric:        Behavior: Behavior normal.    Previous notes and tests were reviewed. The plan was reviewed with the patient/family, and all questions/concerned were addressed.  It was my pleasure to see Kellis today and participate in her care. Please feel free to contact me with any questions or concerns.  Sincerely,  Rexene Alberts, DO Allergy & Immunology  [facility] office: [place] office: [phone]

## 2021-04-16 NOTE — Assessment & Plan Note (Addendum)
Asymptomatic with no meds.  Monitor symptoms.

## 2021-04-16 NOTE — Assessment & Plan Note (Addendum)
Past history - Perennial rhinoconjunctivitis symptoms for the past 20 years with worsening in the spring and fall.  Patient has tried Zyrtec, Singulair, Nasacort, saline nasal spray and Zaditor eyedrops with some benefit.  Patient used to be on allergy immunotherapy for 10 years with good benefit. 2019 skin testing showed, positive to dust mites, cat, dog, grass and horse. 2 indoor cats at home.  Interim history - symptoms flared, trying to stay indoors. Wants to start injections.  Continue environmental control measures - especially regarding the cats.  Continue Singulair (montelukast) 10mg  daily.   May use over the counter antihistamines such as Zyrtec (cetirizine), Claritin (loratadine), Allegra (fexofenadine), or Xyzal (levocetirizine) daily as needed. May take twice a day if needed.  May use Flonase sensamist 2 sprays per nostril once day for nasal congestion as needed.   May use azelastine nasal spray 1-2 sprays per nostril twice a day as needed for runny nose/drainage.  Nasal saline spray (i.e., Simply Saline) or nasal saline lavage (i.e., NeilMed) is recommended as needed and prior to medicated nasal sprays.  May use Zaditor eye drops as needed for itchy/watery eyes.   Consider allergy injections for long term control if above medications do not help the symptoms - handout given. Start once asthma is more stable.  Get bloodwork as unable to skin test today and last skin testing was not positive to spring time allergens.

## 2021-04-16 NOTE — Assessment & Plan Note (Signed)
Past history - Eucrisa not effective.  Continue proper skin care measures.  May use triamcinolone 0.1% ointment twice a day as needed for the hands. Do not use on the face, neck, armpits or groin area. Do not use more than 3 weeks in a row.  

## 2021-04-16 NOTE — Assessment & Plan Note (Addendum)
Past history - Diagnosed with asthma over 19 years ago. Patient moved in with her parents who have cats and dogs at home during this time. Did not tolerate Trelegy.  Interim history - symptoms flared 1 month ago and restarted Breo, Spiriva but once ran out of [place] got worse. Using albuterol throughout the day with good benefit. Usually flares in the spring and fall.  Today's spirometry showed some restriction with 15% improvement in FEV1 post bronchodilator treatment. Clinically feeling improved.   ACT score 7.  Use albuterol nebulizer twice a day for the next 1 week before using Breztri.  Daily controller medication(s):START Breztri 2 puffs twice a day with spacer and rinse mouth afterwards. Samples given.   Spacer given and demonstrated proper use with inhaler. Patient understood technique and all questions/concerned were addressed.  ? Continue Singulair 10mg  daily  May use albuterol rescue inhaler 2 puffs or nebulizer every 4 to 6 hours as needed for shortness of breath, chest tightness, coughing, and wheezing. May use albuterol rescue inhaler 2 puffs 5 to 15 minutes prior to strenuous physical activities. Monitor frequency of use.   Repeat spirometry at next visit.  Declines prednisone due to side effects and osteoporosis.   Will need daily maintenance inhaler during the spring and fall months.   If above regimen not effective, consider adding biologics next.

## 2021-04-16 NOTE — Assessment & Plan Note (Signed)
Past history - reactions to amoxicillin, cefuroxime, hydrocodone, levofloxacin, morphine, oxycodone, and propoxyphene in the past.   Continue to avoid all these medications.  

## 2021-04-20 ENCOUNTER — Encounter: Payer: Self-pay | Admitting: Podiatry

## 2021-04-20 ENCOUNTER — Ambulatory Visit: Payer: BC Managed Care – PPO | Admitting: Podiatry

## 2021-04-20 ENCOUNTER — Ambulatory Visit (INDEPENDENT_AMBULATORY_CARE_PROVIDER_SITE_OTHER): Payer: BC Managed Care – PPO

## 2021-04-20 ENCOUNTER — Other Ambulatory Visit: Payer: Self-pay

## 2021-04-20 DIAGNOSIS — M25521 Pain in right elbow: Secondary | ICD-10-CM | POA: Diagnosis not present

## 2021-04-20 DIAGNOSIS — M21862 Other specified acquired deformities of left lower leg: Secondary | ICD-10-CM

## 2021-04-20 DIAGNOSIS — S56911D Strain of unspecified muscles, fascia and tendons at forearm level, right arm, subsequent encounter: Secondary | ICD-10-CM | POA: Diagnosis not present

## 2021-04-20 DIAGNOSIS — M7662 Achilles tendinitis, left leg: Secondary | ICD-10-CM | POA: Diagnosis not present

## 2021-04-20 DIAGNOSIS — M216X2 Other acquired deformities of left foot: Secondary | ICD-10-CM

## 2021-04-20 DIAGNOSIS — R531 Weakness: Secondary | ICD-10-CM | POA: Diagnosis not present

## 2021-04-20 DIAGNOSIS — M7752 Other enthesopathy of left foot: Secondary | ICD-10-CM

## 2021-04-20 DIAGNOSIS — M7750 Other enthesopathy of unspecified foot: Secondary | ICD-10-CM

## 2021-04-20 DIAGNOSIS — M7711 Lateral epicondylitis, right elbow: Secondary | ICD-10-CM | POA: Diagnosis not present

## 2021-04-20 NOTE — Progress Notes (Signed)
  Subjective:  Patient ID: Erin Collins, female    DOB: August 03, 1976,  MRN: [id number]  Chief Complaint  Patient presents with  . Foot Pain      (xray)np-lateral left foot and ankle pain x 4 months-non req-dr carolyn guilloud refer    45 y.o. female presents with the above complaint. History confirmed with patient.  She has been doing physical therapy at [facility] for tennis elbow  Objective:  Physical Exam: warm, good capillary refill, no trophic changes or ulcerative lesions, normal DP and PT pulses and normal sensory exam. Left Foot: Sharp pain on palpation of the distal Achilles tendon at the insertion and in the mid substance  Radiographs: X-ray of the left foot: Small plantar and posterior calcaneal enthesophytes Assessment:   1. Tendonitis, Achilles, left   2. Gastrocnemius equinus of left lower extremity      Plan:  Patient was evaluated and treated and all questions answered.  Discussed the etiology and treatment options for Achilles tendinitis including stretching, formal physical therapy with an eccentric exercises therapy plan, supportive shoegears such as a running shoe or sneaker, heel lifts, topical and oral medications.  We also discussed that I do not routinely perform injections in this area because of the risk of an increased damage or rupture of the tendon.  We also discussed the role of surgical treatment of this for patients who do not improve after exhausting non-surgical treatment options.  -XR reviewed with patient -Educated on stretching and icing of the affected limb. -Referral placed to physical therapy. -Advised to use Voltaren gel as needed -She would prefer to avoid oral medication at this point.  We will consider Mobic if this worsens or does not improve -Recommend immobilization in CAM boot is a short 1 was dispensed today [facility]  Return in about 6 weeks (around 06/01/2021) for re-check Achilles tendon.

## 2021-04-20 NOTE — Patient Instructions (Signed)

## 2021-04-22 ENCOUNTER — Telehealth: Payer: Self-pay

## 2021-04-22 DIAGNOSIS — M25521 Pain in right elbow: Secondary | ICD-10-CM | POA: Diagnosis not present

## 2021-04-22 DIAGNOSIS — M7711 Lateral epicondylitis, right elbow: Secondary | ICD-10-CM | POA: Diagnosis not present

## 2021-04-22 DIAGNOSIS — S56911D Strain of unspecified muscles, fascia and tendons at forearm level, right arm, subsequent encounter: Secondary | ICD-10-CM | POA: Diagnosis not present

## 2021-04-22 DIAGNOSIS — R531 Weakness: Secondary | ICD-10-CM | POA: Diagnosis not present

## 2021-04-22 NOTE — Telephone Encounter (Signed)
Order and demographics faxed to [facility] [phone] Continuation of therapy twice weekly for 8 to 10 weeks M76.62, M21.6 x 2  Fax confirmation received

## 2021-04-23 ENCOUNTER — Other Ambulatory Visit: Payer: Self-pay | Admitting: Student

## 2021-04-24 LAB — CBC WITH DIFFERENTIAL/PLATELET
Basophils Absolute: 0.1 10*3/uL (ref 0.0–0.2)
Basos: 1 %
EOS (ABSOLUTE): 0.3 10*3/uL (ref 0.0–0.4)
Eos: 3 %
Hematocrit: 43.6 % (ref 34.0–46.6)
Hemoglobin: 14.1 g/dL (ref 11.1–15.9)
Immature Grans (Abs): 0 10*3/uL (ref 0.0–0.1)
Immature Granulocytes: 0 %
Lymphocytes Absolute: 2.3 10*3/uL (ref 0.7–3.1)
Lymphs: 22 %
MCH: 29 pg (ref 26.6–33.0)
MCHC: 32.3 g/dL (ref 31.5–35.7)
MCV: 90 fL (ref 79–97)
Monocytes Absolute: 0.6 10*3/uL (ref 0.1–0.9)
Monocytes: 6 %
Neutrophils Absolute: 7.2 10*3/uL — ABNORMAL HIGH (ref 1.4–7.0)
Neutrophils: 68 %
Platelets: 330 10*3/uL (ref 150–450)
RBC: 4.86 x10E6/uL (ref 3.77–5.28)
RDW: 11.6 % — ABNORMAL LOW (ref 11.7–15.4)
WBC: 10.4 10*3/uL (ref 3.4–10.8)

## 2021-04-24 LAB — ALLERGENS W/TOTAL IGE AREA 2
Alternaria Alternata IgE: <0.1 kU/L
Aspergillus Fumigatus IgE: <0.1 kU/L
Bermuda Grass IgE: <0.1 kU/L
Cat Dander IgE: 53.8 kU/L — AB
Cedar, Mountain IgE: <0.1 kU/L
Cladosporium Herbarum IgE: <0.1 kU/L
Cockroach, German IgE: 0.13 kU/L — AB
Common Silver Birch IgE: <0.1 kU/L
Cottonwood IgE: <0.1 kU/L
D Farinae IgE: 2.3 kU/L — AB
D Pteronyssinus IgE: 1.21 kU/L — AB
Dog Dander IgE: 7.96 kU/L — AB
Elm, American IgE: <0.1 kU/L
IgE (Immunoglobulin E), Serum: 522 IU/mL — ABNORMAL HIGH (ref 6–495)
Johnson Grass IgE: <0.1 kU/L
Maple/Box Elder IgE: 0.11 kU/L — AB
Mouse Urine IgE: 0.12 kU/L — AB
Oak, White IgE: <0.1 kU/L
Pecan, Hickory IgE: <0.1 kU/L
Penicillium Chrysogen IgE: <0.1 kU/L
Pigweed, Rough IgE: <0.1 kU/L
Ragweed, Short IgE: <0.1 kU/L
Sheep Sorrel IgE Qn: <0.1 kU/L
Timothy Grass IgE: 1.57 kU/L — AB
White Mulberry IgE: <0.1 kU/L

## 2021-04-26 ENCOUNTER — Other Ambulatory Visit: Payer: Self-pay | Admitting: *Deleted

## 2021-04-26 ENCOUNTER — Other Ambulatory Visit: Payer: Self-pay

## 2021-04-26 MED ORDER — VENTOLIN HFA 108 (90 BASE) MCG/ACT IN AERS: INHALATION_SPRAY | RESPIRATORY_TRACT | 2 refills | Status: DC

## 2021-04-26 MED ORDER — VENTOLIN HFA 108 (90 BASE) MCG/ACT IN AERS: INHALATION_SPRAY | RESPIRATORY_TRACT | 0 refills | Status: DC

## 2021-04-28 DIAGNOSIS — M25521 Pain in right elbow: Secondary | ICD-10-CM | POA: Diagnosis not present

## 2021-04-28 DIAGNOSIS — M7711 Lateral epicondylitis, right elbow: Secondary | ICD-10-CM | POA: Diagnosis not present

## 2021-04-28 DIAGNOSIS — S56911D Strain of unspecified muscles, fascia and tendons at forearm level, right arm, subsequent encounter: Secondary | ICD-10-CM | POA: Diagnosis not present

## 2021-04-28 DIAGNOSIS — R531 Weakness: Secondary | ICD-10-CM | POA: Diagnosis not present

## 2021-05-02 IMAGING — DX DG CHEST 1V PORT
1 series · 1 of 1 positions shown · non-contrast
Comparison: December 17, 2017

CLINICAL DATA: Shortness of breath

EXAM:
PORTABLE CHEST 1 VIEW

[chest ap]
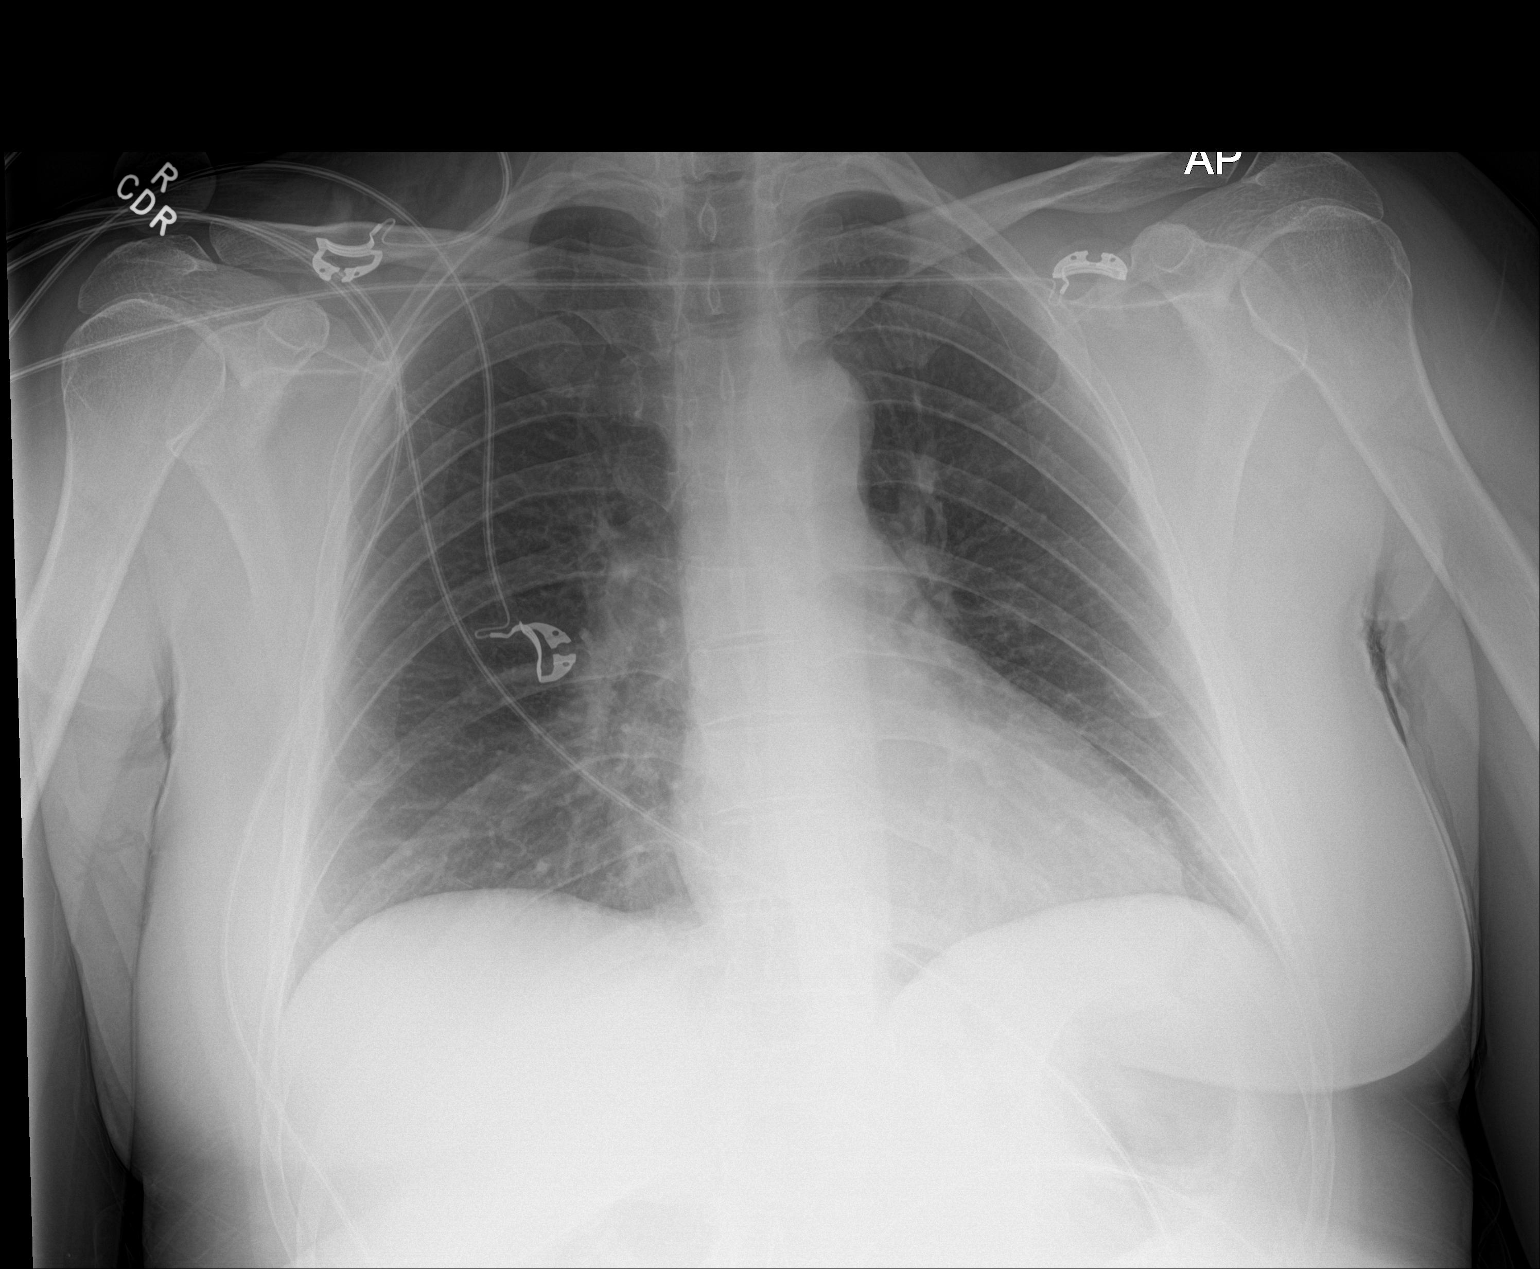

[1 of 1 positions shown; findings below may reference images not displayed]

FINDINGS: The heart size and mediastinal contours are within normal limits.
Both lungs are clear. The visualized skeletal structures are
unremarkable.
IMPRESSION: No active disease.

## 2021-05-05 DIAGNOSIS — M79672 Pain in left foot: Secondary | ICD-10-CM | POA: Diagnosis not present

## 2021-05-05 DIAGNOSIS — M7662 Achilles tendinitis, left leg: Secondary | ICD-10-CM | POA: Diagnosis not present

## 2021-05-05 DIAGNOSIS — R531 Weakness: Secondary | ICD-10-CM | POA: Diagnosis not present

## 2021-05-05 DIAGNOSIS — R2689 Other abnormalities of gait and mobility: Secondary | ICD-10-CM | POA: Diagnosis not present

## 2021-05-07 IMAGING — DX DG CHEST 1V PORT
1 series · 1 of 1 positions shown · non-contrast
Comparison: 12/28/2019

CLINICAL DATA: COVID positive.  Shortness of breath

EXAM:
PORTABLE CHEST 1 VIEW

[chest ap]
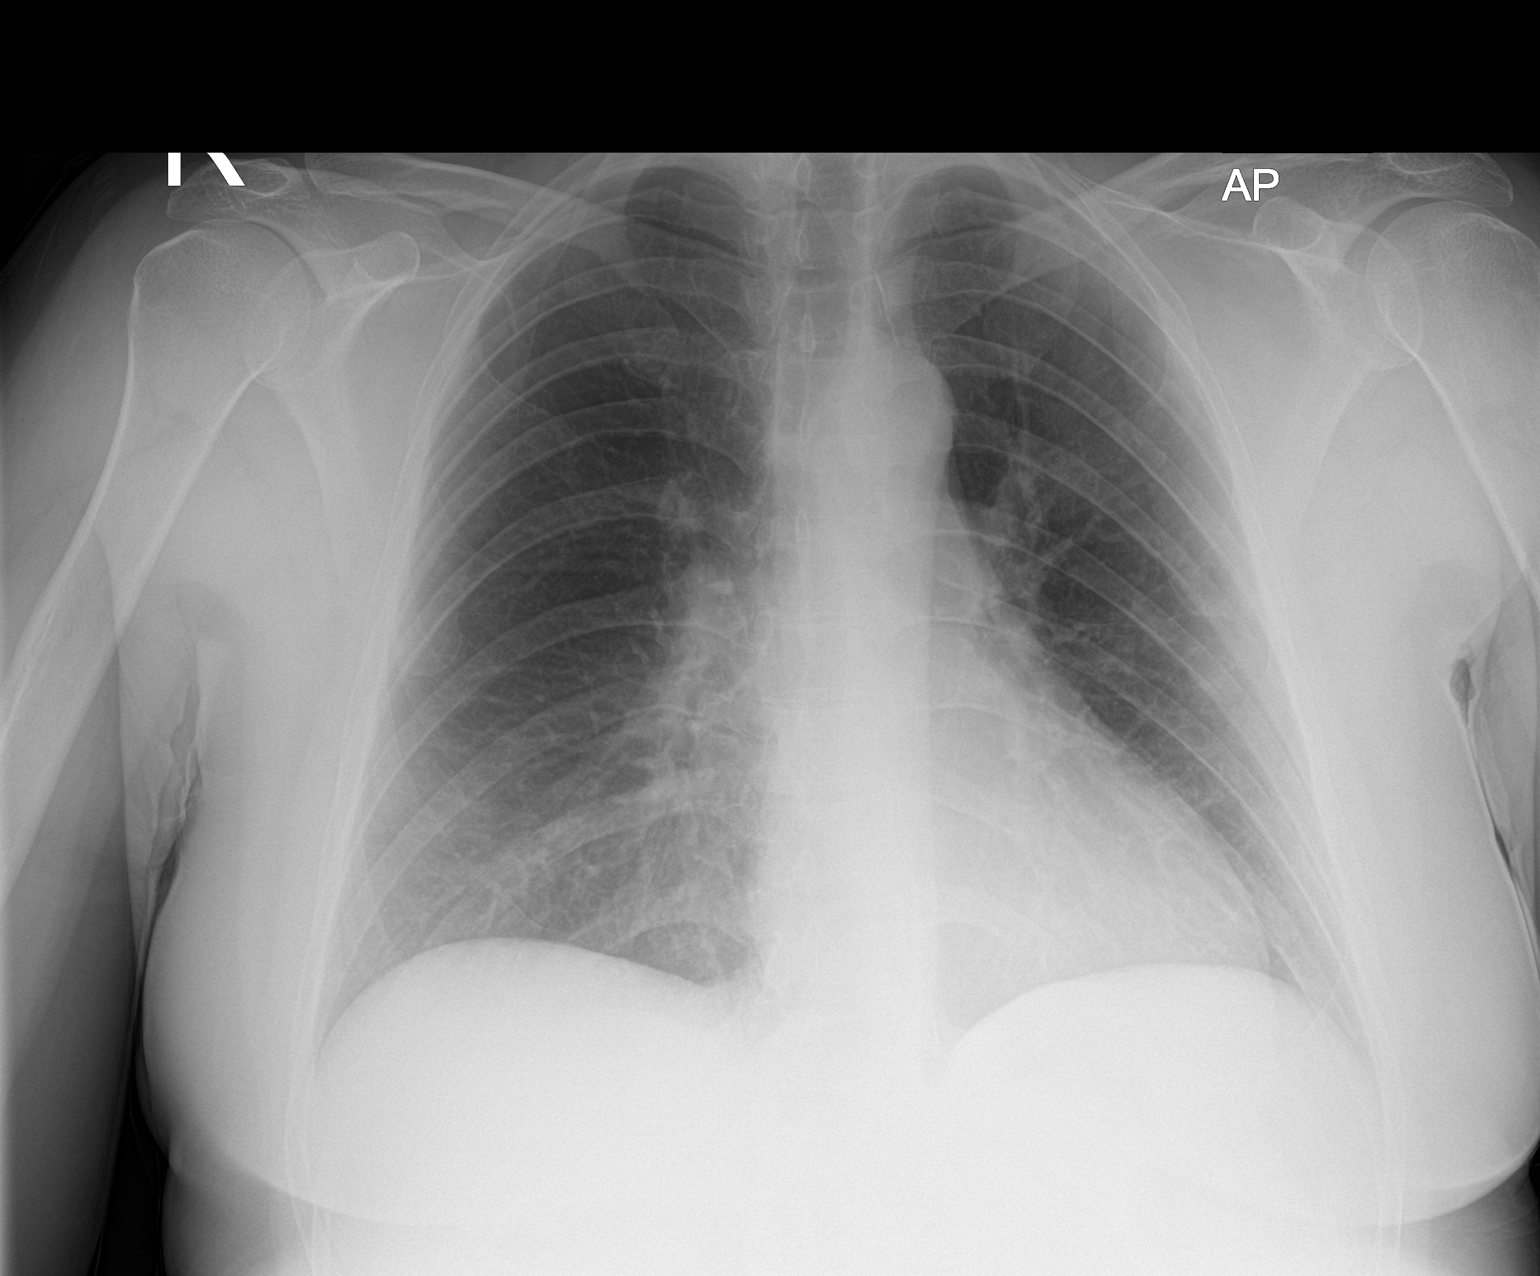

[1 of 1 positions shown; findings below may reference images not displayed]

FINDINGS: Possible early patchy peripheral infiltrates in the left mid lung.
Right lung clear. Heart is normal size. No effusions or acute bony
abnormality.
IMPRESSION: Possible early patchy peripheral infiltrates in the left mid lung.

## 2021-05-11 DIAGNOSIS — R2689 Other abnormalities of gait and mobility: Secondary | ICD-10-CM | POA: Diagnosis not present

## 2021-05-11 DIAGNOSIS — R531 Weakness: Secondary | ICD-10-CM | POA: Diagnosis not present

## 2021-05-11 DIAGNOSIS — M79672 Pain in left foot: Secondary | ICD-10-CM | POA: Diagnosis not present

## 2021-05-11 DIAGNOSIS — M7662 Achilles tendinitis, left leg: Secondary | ICD-10-CM | POA: Diagnosis not present

## 2021-05-18 ENCOUNTER — Telehealth: Payer: Self-pay

## 2021-05-18 NOTE — Telephone Encounter (Signed)
pt is requesting a call back

## 2021-05-18 NOTE — Telephone Encounter (Signed)
Return pt's call; no answer - left message to call the office. 

## 2021-05-18 NOTE — Telephone Encounter (Signed)
RTC, patient is asking for the name of the GYN physician [facility] referred her to at the [facility].  She states she saw her on 04/22/2020.  Pt's name and DOB confirmed.  Patient was informed she was referred to [facility] and saw Mallie Snooks, CNM on 04/22/2020.  Patient states she couldn't remember her name and wanted to call to make a f/u appt and MMG. No other needs voiced and patient very appreciative. Encounter closed. SChaplin, RN,BSN

## 2021-05-19 ENCOUNTER — Other Ambulatory Visit: Payer: BC Managed Care – PPO

## 2021-05-21 DIAGNOSIS — R2689 Other abnormalities of gait and mobility: Secondary | ICD-10-CM | POA: Diagnosis not present

## 2021-05-21 DIAGNOSIS — R531 Weakness: Secondary | ICD-10-CM | POA: Diagnosis not present

## 2021-05-21 DIAGNOSIS — M79672 Pain in left foot: Secondary | ICD-10-CM | POA: Diagnosis not present

## 2021-05-21 DIAGNOSIS — M7662 Achilles tendinitis, left leg: Secondary | ICD-10-CM | POA: Diagnosis not present

## 2021-05-24 ENCOUNTER — Telehealth: Payer: Self-pay

## 2021-05-24 NOTE — Telephone Encounter (Signed)
RTC to patient, she was informed that current recommendations are to take the morning after pill w/n 72 hours of unprotected sex and that it is not 100% effective per Dr. Durenda Age instructions.  She verbalized understanding. SChaplin, RN,BSN

## 2021-05-24 NOTE — Telephone Encounter (Signed)
RTC, patient wants to get back on birth control.  Appt made for 05/26/21 w/ Dr. Gilford Rile at 1545 to discuss [facility] options.  Also, patient states she has "reconnected" with her high school sweetheart and hand unprotected sex Sat and Sunday.  She took Plan B yesterday.  Wants to know if this should cover unwanted pregnancy.  She did not take Plan B today. Please advise SChaplin, RN,BSN

## 2021-05-24 NOTE — Telephone Encounter (Signed)
Requesting to speak with a nurse about birth control. Please call back.

## 2021-05-26 ENCOUNTER — Encounter: Payer: Self-pay | Admitting: Internal Medicine

## 2021-05-26 ENCOUNTER — Other Ambulatory Visit: Payer: Self-pay

## 2021-05-26 ENCOUNTER — Ambulatory Visit: Payer: BC Managed Care – PPO | Admitting: Internal Medicine

## 2021-05-26 VITALS — BP 125/78 | HR 89 | Temp 98.2°F | Ht 64.0 in | Wt 182.9 lb

## 2021-05-26 DIAGNOSIS — M25572 Pain in left ankle and joints of left foot: Secondary | ICD-10-CM

## 2021-05-26 DIAGNOSIS — M7662 Achilles tendinitis, left leg: Secondary | ICD-10-CM | POA: Diagnosis not present

## 2021-05-26 DIAGNOSIS — R531 Weakness: Secondary | ICD-10-CM | POA: Diagnosis not present

## 2021-05-26 DIAGNOSIS — R2689 Other abnormalities of gait and mobility: Secondary | ICD-10-CM | POA: Diagnosis not present

## 2021-05-26 DIAGNOSIS — M79672 Pain in left foot: Secondary | ICD-10-CM | POA: Diagnosis not present

## 2021-05-26 DIAGNOSIS — Z7251 High risk heterosexual behavior: Secondary | ICD-10-CM

## 2021-05-26 LAB — POCT URINE PREGNANCY: Preg Test, Ur: NEGATIVE

## 2021-05-26 MED ORDER — LO LOESTRIN FE 1 MG-10 MCG / 10 MCG PO TABS: 1.0000 | ORAL_TABLET | Freq: Every day | ORAL | 1 refills | Status: DC

## 2021-05-26 NOTE — Patient Instructions (Addendum)
To Erin Collins,   It was a pleasure seeing you today! Today we discussed contraceptive options. We will start you on an oral medication to get you to your OBGYN appointment. We will start you on a once  a day medication. I have attached a sheet to your paperwork as well. Please take it as prescribed. I would also recommend your partner use condoms for additional protection. I will have you follow back with me in 2-3 months! Maudie Mercury, MD    Norethindrone Acetate; Ethinyl Estradiol Oral Tablets (contraception) What is this medicine? NORETHINDRONE ACETATE; ETHINYL ESTRADIOL (nor eth IN drone AS e tate; ETH in il es tra DYE ole) is an oral contraceptive. The products combine two types of female hormones, an estrogen and a progestin. These products prevent ovulation and pregnancy. This medicine may be used for other purposes; ask your health care provider or pharmacist if you have questions. COMMON BRAND NAME(S): Rolm Bookbinder, Hailey 1.5/30, Junel 1.5/30, Junel 1/20, LARIN, Loestrin 1.5/30, Loestrin 1/20, Microgestin 1.5/30, Microgestin 1/20 What should I tell my health care provider before I take this medicine? They need to know if you have or ever had any of these conditions:  abnormal vaginal bleeding  blood vessel disease or blood clots  breast, cervical, endometrial, ovarian, liver, or uterine cancer  diabetes  gallbladder disease  having surgery  heart disease or recent heart attack  high blood pressure  high cholesterol or triglycerides  history of irregular heartbeat or heart valve problems  kidney disease  liver disease  migraine headaches  protein C deficiency  protein S deficiency  recently had a baby, miscarriage, or abortion  stroke  systemic lupus erythematosus (SLE)  tobacco smoker  an unusual or allergic reaction to estrogens, progestins, other medicines, foods, dyes, or preservatives  pregnant or trying to get pregnant  breast-feeding How  should I use this medicine? Take this medicine by mouth. To reduce nausea, this medicine may be taken with food. Follow the directions on the prescription label. Take this medicine at the same time each day and in the order directed on the package. Do not take your medicine more often than directed. Contact your pediatrician regarding the use of this medicine in children. Special care may be needed. This medicine has been used in female children who have started having menstrual periods. A patient package insert for the product will be given with each prescription and refill. Read this sheet carefully each time. The sheet may change frequently. Overdosage: If you think you have taken too much of this medicine contact a poison control center or emergency room at once. NOTE: This medicine is only for you. Do not share this medicine with others. What if I miss a dose? If you miss a dose, refer to the patient information sheet you received with your medicine for direction. If you miss more than one pill, this medicine may not be as effective and you may need to use another form of birth control. What may interact with this medicine? Do not take this medicine with the following medication:  dasabuvir; ombitasvir; paritaprevir; ritonavir  ombitasvir; paritaprevir; ritonavir This medicine may also interact with the following medications:  acetaminophen  antibiotics or medicines for infections, especially rifampin, rifabutin, rifapentine, and griseofulvin, and possibly penicillins or tetracyclines  aprepitant  ascorbic acid (vitamin C)  atorvastatin  barbiturate medicines, such as phenobarbital  bosentan  carbamazepine  caffeine  clofibrate  cyclosporine  dantrolene  doxercalciferol  felbamate  grapefruit juice  hydrocortisone  medicines for anxiety or sleeping problems, such as diazepam or temazepam  medicines for diabetes, including pioglitazone  mineral  oil  modafinil  mycophenolate  nefazodone  oxcarbazepine  phenytoin  prednisolone  ritonavir or other medicines for HIV infection or AIDS  rosuvastatin  selegiline  soy isoflavones supplements  St. John's wort  tamoxifen or raloxifene  theophylline  thyroid hormones  topiramate  warfarin This list may not describe all possible interactions. Give your health care provider a list of all the medicines, herbs, non-prescription drugs, or dietary supplements you use. Also tell them if you smoke, drink alcohol, or use illegal drugs. Some items may interact with your medicine. What should I watch for while using this medicine? Visit your doctor or health care professional for regular checks on your progress. You will need a regular breast and pelvic exam and Pap smear while on this medicine. Use an additional method of contraception during the first cycle that you take these tablets. If you have any reason to think you are pregnant, stop taking this medicine right away and contact your doctor or health care professional. If you are taking this medicine for hormone related problems, it may take several cycles of use to see improvement in your condition. Smoking increases the risk of getting a blood clot or having a stroke while you are taking birth control pills, especially if you are more than 44 years old. You are strongly advised not to smoke. This medicine can make your body retain fluid, making your fingers, hands, or ankles swell. Your blood pressure can go up. Contact your doctor or health care professional if you feel you are retaining fluid. This medicine can make you more sensitive to the sun. Keep out of the sun. If you cannot avoid being in the sun, wear protective clothing and use sunscreen. Do not use sun lamps or tanning beds/booths. If you wear contact lenses and notice visual changes, or if the lenses begin to feel uncomfortable, consult your eye care specialist. In  some women, tenderness, swelling, or minor bleeding of the gums may occur. Notify your dentist if this happens. Brushing and flossing your teeth regularly may help limit this. See your dentist regularly and inform your dentist of the medicines you are taking. If you are going to have elective surgery, you may need to stop taking this medicine before the surgery. Consult your health care professional for advice. This medicine does not protect you against HIV infection (AIDS) or any other sexually transmitted diseases. What side effects may I notice from receiving this medicine? Side effects that you should report to your doctor or health care professional as soon as possible:  allergic reactions such as skin rash or itching, hives, swelling of the lips, mouth, tongue, or throat  breast tissue changes or discharge  dark patches of skin on your forehead, cheeks, upper lip, and chin  depression  high blood pressure  migraines or severe, sudden headaches  signs and symptoms of a blood clot such as breathing problems; changes in vision; chest pain; severe, sudden headache; pain, swelling, warmth in the leg; trouble speaking; sudden numbness or weakness of the face, arm or leg  stomach pain  symptoms of vaginal infection like itching, irritation or unusual discharge  yellowing of the eyes or skin Side effects that usually do not require medical attention (report these to your doctor or health care professional if they continue or are bothersome):  acne  breast pain, tenderness  irregular vaginal bleeding or  spotting, particularly during the first month of use  mild headache  nausea  weight gain (slight) This list may not describe all possible side effects. Call your doctor for medical advice about side effects. You may report side effects to FDA at 1-800-FDA-1088. Where should I keep my medicine? Keep out of the reach of children. Store at room temperature between 15 and 30 degrees  C (59 and 86 degrees F). Throw away any unused medicine after the expiration date. NOTE: This sheet is a summary. It may not cover all possible information. If you have questions about this medicine, talk to your doctor, pharmacist, or health care provider.  2021 Elsevier/Gold Standard (2019-10-24 18:13:00)

## 2021-05-27 ENCOUNTER — Encounter: Payer: Self-pay | Admitting: Internal Medicine

## 2021-05-27 ENCOUNTER — Other Ambulatory Visit: Payer: BC Managed Care – PPO

## 2021-05-27 DIAGNOSIS — Z7251 High risk heterosexual behavior: Secondary | ICD-10-CM | POA: Insufficient documentation

## 2021-05-27 DIAGNOSIS — E559 Vitamin D deficiency, unspecified: Secondary | ICD-10-CM | POA: Diagnosis not present

## 2021-05-27 NOTE — Progress Notes (Signed)
   CC: Discuss Contraception   HPI:  [name] is a 45 y.o. M/F, with a PMH noted below, who presents to the clinic to discuss oral contraception. To see the management of their acute and chronic conditions, please see the A&P note under the Encounters tab.   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  Respiratory:  Negative for cough.   Cardiovascular:  Negative for chest pain and palpitations.  Gastrointestinal:  Negative for abdominal pain, constipation, diarrhea, nausea and vomiting.  Genitourinary:  Negative for dysuria, frequency, hematuria and urgency.    Physical Exam:  Vitals:   05/26/21 1553  BP: 125/78  Pulse: 89  Temp: 98.2 F (36.8 C)  TempSrc: Oral  SpO2: 98%  Weight: 182 lb 14.4 oz (83 kg)  Height: 5\' 4"  (1.626 m)   Physical Exam Constitutional:      General: She is not in acute distress.    Appearance: Normal appearance. She is not ill-appearing or toxic-appearing.  Cardiovascular:     Rate and Rhythm: Normal rate and regular rhythm.     Pulses: Normal pulses.     Heart sounds: Normal heart sounds. No murmur heard.   No friction rub. No gallop.  Pulmonary:     Effort: Pulmonary effort is normal.     Breath  sounds: Normal breath sounds. No wheezing, rhonchi or rales.  Abdominal:     General: Abdomen is flat. Bowel sounds are normal.     Palpations: Abdomen is soft.  Neurological:     Mental Status: She is alert.  Psychiatric:        Mood and Affect: Mood normal.        Behavior: Behavior normal.     Assessment & Plan:   See Encounters Tab for problem based charting.  Patient discussed with Dr. Jimmye Norman

## 2021-05-27 NOTE — Progress Notes (Signed)
Patient here for lab visit for f/u vitamin D level. Order placed.

## 2021-05-27 NOTE — Assessment & Plan Note (Signed)
Since being seen by podiatry and being treated by PT, Erin Collins reports tremendous improvement in her ankle pain.  - Continue working with PT.

## 2021-05-27 NOTE — Assessment & Plan Note (Addendum)
Patient presents to the clinic after rekindling a relationship with her significant other from high school. They have been dating now for several months and over this weekend (6/4-6/5) they had consensual, unprotected sexual intercourse. Erin Collins did take Plan B that Saturday, but would like to discuss contraceptive therapy today.   A/P:  Patient presents to the clinic to discuss contraception. Partner currently no wearing condoms, and patient currently not on medication. She does have an OBGYN appointment next month, but would like to trial a medication before her appointment. Discussed different modalities today. Patient currently prefers OCP therapy which she used in [place] years ago. She could not recall the name of the medication. We will start on a short course of OCP in the interim until she meets with her OBGYN physician for definitive treatment. - Lo Loestrin Fe - POC pregnancy test - Follow up with OBGYN appointment

## 2021-05-28 LAB — VITAMIN D 25 HYDROXY (VIT D DEFICIENCY, FRACTURES): Vit D, 25-Hydroxy: 25.1 ng/mL — ABNORMAL LOW (ref 30.0–100.0)

## 2021-05-28 NOTE — Progress Notes (Signed)
Internal Medicine Clinic Attending  Case discussed with Dr. Gilford Rile  At the time of the visit.  We reviewed the resident's history and exam and pertinent patient test results.  I agree with the assessment, diagnosis, and plan of care documented in the resident's note. Patient does not smoke and does not have a history of blood clots.

## 2021-06-01 ENCOUNTER — Ambulatory Visit: Payer: BC Managed Care – PPO | Admitting: Podiatry

## 2021-06-03 ENCOUNTER — Ambulatory Visit: Payer: BC Managed Care – PPO | Admitting: Podiatry

## 2021-06-08 ENCOUNTER — Ambulatory Visit: Payer: BC Managed Care – PPO | Admitting: Podiatry

## 2021-06-11 DIAGNOSIS — M7662 Achilles tendinitis, left leg: Secondary | ICD-10-CM | POA: Diagnosis not present

## 2021-06-11 DIAGNOSIS — R531 Weakness: Secondary | ICD-10-CM | POA: Diagnosis not present

## 2021-06-11 DIAGNOSIS — R2689 Other abnormalities of gait and mobility: Secondary | ICD-10-CM | POA: Diagnosis not present

## 2021-06-11 DIAGNOSIS — M79672 Pain in left foot: Secondary | ICD-10-CM | POA: Diagnosis not present

## 2021-06-17 ENCOUNTER — Ambulatory Visit: Payer: BC Managed Care – PPO | Admitting: Podiatry

## 2021-06-22 ENCOUNTER — Encounter: Payer: Self-pay | Admitting: *Deleted

## 2021-06-22 ENCOUNTER — Telehealth: Payer: Self-pay | Admitting: Allergy

## 2021-06-22 MED ORDER — VENTOLIN HFA 108 (90 BASE) MCG/ACT IN AERS: INHALATION_SPRAY | RESPIRATORY_TRACT | 2 refills | Status: DC

## 2021-06-22 NOTE — Telephone Encounter (Signed)
[facility] [place] [facility] for pt explaining me sending it in and if she had any questions to call us

## 2021-06-22 NOTE — Telephone Encounter (Signed)
Pt called and stated she lost her Ventolin inhaler and is out of town and would like [facility]   [place]

## 2021-06-29 ENCOUNTER — Encounter: Payer: Self-pay | Admitting: Podiatry

## 2021-06-29 ENCOUNTER — Ambulatory Visit: Payer: BC Managed Care – PPO | Admitting: Podiatry

## 2021-06-29 ENCOUNTER — Other Ambulatory Visit: Payer: Self-pay

## 2021-06-29 DIAGNOSIS — M216X2 Other acquired deformities of left foot: Secondary | ICD-10-CM

## 2021-06-29 DIAGNOSIS — M7662 Achilles tendinitis, left leg: Secondary | ICD-10-CM

## 2021-06-29 DIAGNOSIS — M21862 Other specified acquired deformities of left lower leg: Secondary | ICD-10-CM

## 2021-06-29 DIAGNOSIS — M62831 Muscle spasm of calf: Secondary | ICD-10-CM

## 2021-06-29 MED ORDER — MELOXICAM 15 MG PO TABS: 15.0000 mg | ORAL_TABLET | Freq: Every day | ORAL | 3 refills | Status: DC

## 2021-06-29 MED ORDER — CYCLOBENZAPRINE HCL 10 MG PO TABS: 10.0000 mg | ORAL_TABLET | Freq: Three times a day (TID) | ORAL | 0 refills | Status: DC | PRN

## 2021-06-29 NOTE — Progress Notes (Signed)
  Subjective:  Patient ID: Erin Collins, female    DOB: Jan 21, 1976,  MRN: [id number]  Chief Complaint  Patient presents with   Foot Pain       -lateral left foot and ankle pain x 4 months-non req-dr carolyn guilloud refer    45 y.o. female returns for follow-up with the above complaint. History confirmed with patient.  Feels it has been getting worse.  Physical therapy has helped some.  They are doing dry needling.  She is having significant severe muscle spasms in the Achilles as well now  Objective:  Physical Exam: warm, good capillary refill, no trophic changes or ulcerative lesions, normal DP and PT pulses and normal sensory exam. Left Foot: Sharp pain on palpation of the distal Achilles tendon at the insertion and in the mid substance  Radiographs: X-ray of the left foot: Small plantar and posterior calcaneal enthesophytes Assessment:   1. Tendonitis, Achilles, left   2. Gastrocnemius equinus of left lower extremity   3. Muscle spasm of left calf      Plan:  Patient was evaluated and treated and all questions answered.  Discussed the etiology and treatment options for Achilles tendinitis including stretching, formal physical therapy with an eccentric exercises therapy plan, supportive shoegears such as a running shoe or sneaker, heel lifts, topical and oral medications.  We also discussed that I do not routinely perform injections in this area because of the risk of an increased damage or rupture of the tendon.  We also discussed the role of surgical treatment of this for patients who do not improve after exhausting non-surgical treatment options.  Unfortunate she has not had much improvement with immobilization.  Physical therapy is helping some.  I recommend at this point we get an MRI to evaluate for any possible tearing or calcifications within the tendon.  We discussed treatment of these including open surgery versus Topaz and PRP.  Prescription for Flexeril sent for muscle  spasm she is having that been quite severe to take only as needed as well as meloxicam I think it be good to have her on a daily anti-inflammatory at this point.  Return in 4 weeks for review of the MRI.  Return in about 4 weeks (around 07/27/2021) for after MRI to review.

## 2021-07-02 DIAGNOSIS — M7662 Achilles tendinitis, left leg: Secondary | ICD-10-CM | POA: Diagnosis not present

## 2021-07-02 DIAGNOSIS — R531 Weakness: Secondary | ICD-10-CM | POA: Diagnosis not present

## 2021-07-02 DIAGNOSIS — M79672 Pain in left foot: Secondary | ICD-10-CM | POA: Diagnosis not present

## 2021-07-02 DIAGNOSIS — R2689 Other abnormalities of gait and mobility: Secondary | ICD-10-CM | POA: Diagnosis not present

## 2021-07-09 DIAGNOSIS — M7662 Achilles tendinitis, left leg: Secondary | ICD-10-CM | POA: Diagnosis not present

## 2021-07-09 DIAGNOSIS — R2689 Other abnormalities of gait and mobility: Secondary | ICD-10-CM | POA: Diagnosis not present

## 2021-07-09 DIAGNOSIS — M79672 Pain in left foot: Secondary | ICD-10-CM | POA: Diagnosis not present

## 2021-07-09 DIAGNOSIS — R531 Weakness: Secondary | ICD-10-CM | POA: Diagnosis not present

## 2021-07-14 ENCOUNTER — Ambulatory Visit: Payer: BC Managed Care – PPO | Admitting: Obstetrics and Gynecology

## 2021-07-16 ENCOUNTER — Other Ambulatory Visit: Payer: Self-pay

## 2021-07-16 ENCOUNTER — Ambulatory Visit
Admission: RE | Admit: 2021-07-16 | Discharge: 2021-07-16 | Disposition: A | Discharge status: Home / Self Care | Payer: BC Managed Care – PPO | Source: Ambulatory Visit | Attending: Podiatry | Admitting: Podiatry

## 2021-07-16 DIAGNOSIS — M7662 Achilles tendinitis, left leg: Secondary | ICD-10-CM | POA: Diagnosis not present

## 2021-07-16 DIAGNOSIS — M79672 Pain in left foot: Secondary | ICD-10-CM | POA: Diagnosis not present

## 2021-07-16 DIAGNOSIS — M216X2 Other acquired deformities of left foot: Secondary | ICD-10-CM

## 2021-07-16 DIAGNOSIS — R2689 Other abnormalities of gait and mobility: Secondary | ICD-10-CM | POA: Diagnosis not present

## 2021-07-16 DIAGNOSIS — R531 Weakness: Secondary | ICD-10-CM | POA: Diagnosis not present

## 2021-07-16 DIAGNOSIS — M21862 Other specified acquired deformities of left lower leg: Secondary | ICD-10-CM

## 2021-07-21 ENCOUNTER — Encounter: Payer: Self-pay | Admitting: Certified Nurse Midwife

## 2021-07-21 ENCOUNTER — Other Ambulatory Visit: Payer: Self-pay

## 2021-07-21 ENCOUNTER — Ambulatory Visit (INDEPENDENT_AMBULATORY_CARE_PROVIDER_SITE_OTHER): Payer: BC Managed Care – PPO | Admitting: Certified Nurse Midwife

## 2021-07-21 VITALS — BP 134/92 | HR 85 | Wt 186.0 lb

## 2021-07-21 DIAGNOSIS — Z3169 Encounter for other general counseling and advice on procreation: Secondary | ICD-10-CM

## 2021-07-21 DIAGNOSIS — Z01419 Encounter for gynecological examination (general) (routine) without abnormal findings: Secondary | ICD-10-CM

## 2021-07-21 DIAGNOSIS — Z789 Other specified health status: Secondary | ICD-10-CM

## 2021-07-21 MED ORDER — TARINA 24 FE 1-20 MG-MCG(24) PO TABS: 1.0000 | ORAL_TABLET | Freq: Every day | ORAL | 1 refills | Status: DC

## 2021-07-21 NOTE — Progress Notes (Signed)
GYNECOLOGY CLINIC ANNUAL PREVENTATIVE CARE ENCOUNTER NOTE  Subjective:   Erin Collins is a 45 y.o. G1P1 female here for a routine annual gynecologic exam.  Current complaints: weight loss and preconception counseling. Itzy recently rekindled a relationship with her high school sweet heart and desires a pregnancy. Previous preterm delivery at 35 weeks 22 years ago. Hx of endometriosis and right sided tubal ligation. She endorses regular periods every 21 days with a 5 day bleed. She  uses the COCs for contraception without complaints. Pt desires a 46lbs weight loss prior to attempting to conceive. She denies abnormal vaginal bleeding, discharge, pelvic pain, problems with intercourse or other gynecologic concerns.    Gynecologic History Patient's last menstrual period was 07/09/2021. Contraception: OCP (estrogen/progesterone) Last Pap: 04/22/20. Results were: normal Last mammogram: 2008 per pt report. Results were: normal with questionable fibroadenoma per pt report.  Obstetric History OB History  Gravida Para Term Preterm AB Living  1         1  SAB IAB Ectopic Multiple Live Births          1    # Outcome Date GA Lbr Len/2nd Weight Sex Delivery Anes PTL Lv  1 Gravida             Past Medical History:  Diagnosis Date   Anemia    Angio-edema    Asthma    Basal cell carcinoma ~ 1999   collarbone   Blood clot in vein ~ 2008   "left breast"    Complication of anesthesia    "I have trouble waking up afterwards" (12/18/2017)   Diarrhea 04/01/2019   Eczema    Family history of adverse reaction to anesthesia    "mom has trouble waking up afterwards" (12/18/2017)   Fluttering heart    "comes and goes" (12/18/2017)   GERD (gastroesophageal reflux disease)    Headache    "weekly" (12/18/2017)   History of kidney stones    Recurrent upper respiratory infection (URI)    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   "bland stare"   Torticollis    Vertigo     Past Surgical History:  Procedure  Laterality Date   BASAL CELL CARCINOMA EXCISION Right ~ 1999   "collarbone"   DILATION AND CURETTAGE OF UTERUS  2005   LAPAROSCOPIC ENDOMETRIOSIS FULGURATION  2005   NASAL SEPTOPLASTY W/ TURBINOPLASTY Bilateral 2014   [place] Right 2005   TYMPANOSTOMY TUBE PLACEMENT      Current Outpatient Medications on File Prior to Visit  Medication Sig Dispense Refill   albuterol (PROVENTIL) (2.5 MG/3ML) 0.083% nebulizer solution Take 3 mLs (2.5 mg total) by nebulization every 4 (four) hours as needed for wheezing or shortness of breath (coughing fits). 75 mL 2   Budeson-Glycopyrrol-Formoterol (BREZTRI AEROSPHERE) 160-9-4.8 MCG/ACT AERO Inhale 2 puffs into the lungs in the morning and at bedtime. with spacer and rinse mouth afterwards. 10.7 g 5   cyclobenzaprine (FLEXERIL) 10 MG tablet Take 1 tablet (10 mg total) by mouth 3 (three) times daily as needed for muscle spasms. 30 tablet 0   meloxicam (MOBIC) 15 MG tablet Take 1 tablet (15 mg total) by mouth daily. 30 tablet 3   montelukast (SINGULAIR) 10 MG tablet Take 1 tablet (10 mg total) by mouth at bedtime. 30 tablet 5   VENTOLIN HFA 108 (90 Base) MCG/ACT inhaler INHALE 1 TO 2 PUFFS BY MOUTH EVERY 6 HOURS AS NEEDED  FOR WHEEZING OR SHORTNESS OF BREATH 18 g 2   No current facility-administered medications on file prior to visit.    Allergies  Allergen Reactions   Amoxicillin Hives   Cefuroxime Axetil Hives   Hydrocodone Itching   Levofloxacin Other (See Comments)    tendonitis   Morphine Itching   Oxycodone-Acetaminophen Itching   Penicillins Hives    Has patient had a PCN reaction causing immediate rash, facial/tongue/throat swelling, SOB or lightheadedness with hypotension: Yes Has patient had a PCN reaction causing severe rash involving mucus membranes or skin necrosis: No Has patient had a PCN reaction that required hospitalization: No Has patient had a PCN reaction occurring within the last 10  years: No If all of the above answers are "NO", then may proceed with Cephalosporin use.    Propoxyphene Itching    Social History   Socioeconomic History   Marital status: Divorced    Spouse name: Not on file   Number of children: Not on file   Years of education: Not on file   Highest education level: Not on file  Occupational History   Not on file  Tobacco Use   Smoking status: Never   Smokeless tobacco: Never  Vaping Use   Vaping Use: Never used  Substance and Sexual Activity   Alcohol use: Not Currently    Comment: 12/18/2017 "maybe 1/year on my birthday"   Drug use: No   Sexual activity: Yes    Partners: Male    Birth control/protection: None, OCP  Other Topics Concern   Not on file  Social History Narrative   Not on file   Social Determinants of Health   Financial Resource Strain: Not on file  Food Insecurity: No Food Insecurity   Worried About Charity fundraiser in the Last Year: Never true   [place] in the Last Year: Never true  Transportation Needs: No Transportation Needs   Lack of Transportation (Medical): No   Lack of Transportation (Non-Medical): No  Physical Activity: Not on file  Stress: Not on file  Social Connections: Not on file  Intimate Partner Violence: Not on file    Family History  Problem Relation Age of Onset   Allergic rhinitis Mother    Asthma Mother     The following portions of the patient's history were reviewed and updated as appropriate: allergies, current medications, past family history, past medical history, past social history, past surgical history and problem list.  Review of Systems Pertinent items noted in HPI and remainder of comprehensive ROS otherwise negative.   Objective:  BP (!) 134/92   Pulse 85   Wt 84.4 kg   LMP 07/09/2021   BMI 31.93 kg/m  CONSTITUTIONAL: Well-developed, well-nourished female in no acute distress.  HENT:  Normocephalic, atraumatic, External right and left ear normal.  Oropharynx is clear and moist EYES: Conjunctivae and EOM are normal. Pupils are equal, round, and reactive to light. No scleral icterus.  NECK: Normal range of motion, supple, no masses.  Normal thyroid.  SKIN: Skin is warm and dry. No rash noted. Not diaphoretic. No erythema. No pallor. [place]: Alert and oriented to person, place, and time. Normal reflexes, muscle tone coordination. No cranial nerve deficit noted. PSYCHIATRIC: Normal mood and affect. Normal behavior. Normal judgment and thought content. CARDIOVASCULAR: Normal heart rate noted, regular rhythm RESPIRATORY: Clear to auscultation bilaterally. Effort and breath sounds normal, no problems with respiration noted. BREASTS: Symmetric in size. No masses, skin changes, nipple drainage, or  lymphadenopathy. Bilateral Fibrous breast tissue noted on exam.  ABDOMEN: Soft, normal bowel sounds, no distention noted.  No tenderness, rebound or guarding.  PELVIC: exam deferred. MUSCULOSKELETAL: Normal range of motion. No tenderness.  No cyanosis, clubbing, or edema.  2+ distal pulses.   Assessment & Plan:  Annual gynecologic examination without pap smear - Continue with routine annual visits. - Mammogram scheduled for later this month.  2.   Encounter for preconception counseling  - Encouraged a prenatal vitamin and folic acid prior to starting preconception. - Continue with oral contraception, until desired conception window.  - Risks associated with conception at Advanced maternal age discussed. Also discussed chances of conception due to age and tubal ligation of Right fallopian tube.  -Follow up encouraged after 6 months with no conception due to [place], if still desired.  3.   Weight loss advised  - Recommendations provided for Mediterranean diet plan with increase of water and lean protein intake. -Recommended at least 30 minutes of cardio/ or weight lifting per day to encourage weight loss.     Routine preventative health maintenance  measures emphasized. Follow up in 1 year for annual exam.  Please refer to After Visit Summary for other counseling recommendations.    Leanord Asal M, Student-MidWife  Daray Polgar Isaias Sakai) Rollene Rotunda, BSN, RNC-OB  Student Nurse-Midwife   07/21/2021  5:14 PM

## 2021-07-29 ENCOUNTER — Other Ambulatory Visit: Payer: Self-pay

## 2021-07-29 ENCOUNTER — Ambulatory Visit: Payer: BC Managed Care – PPO | Admitting: Podiatry

## 2021-07-29 DIAGNOSIS — M216X2 Other acquired deformities of left foot: Secondary | ICD-10-CM

## 2021-07-29 DIAGNOSIS — M7662 Achilles tendinitis, left leg: Secondary | ICD-10-CM | POA: Diagnosis not present

## 2021-07-29 DIAGNOSIS — M21862 Other specified acquired deformities of left lower leg: Secondary | ICD-10-CM

## 2021-07-30 ENCOUNTER — Ambulatory Visit: Payer: BC Managed Care – PPO | Admitting: Allergy

## 2021-08-02 ENCOUNTER — Encounter: Payer: Self-pay | Admitting: Internal Medicine

## 2021-08-02 ENCOUNTER — Encounter: Payer: Self-pay | Admitting: Podiatry

## 2021-08-02 NOTE — Progress Notes (Signed)
  Subjective:  Patient ID: Erin Collins, female    DOB: 1976/02/08,  MRN: [id number]  Chief Complaint  Patient presents with   Tendonitis     (np) fungal infection     45 y.o. female returns for follow-up with the above complaint. History confirmed with patient.  She completed the MRI she has had some improvement  Objective:  Physical Exam: warm, good capillary refill, no trophic changes or ulcerative lesions, normal DP and PT pulses and normal sensory exam. Left Foot: Sharp pain on palpation of the distal Achilles tendon at the insertion and in the mid substance  Radiographs: X-ray of the left foot: Small plantar and posterior calcaneal enthesophytes  Study Result  Narrative & Impression  CLINICAL DATA:  Achilles tendinitis   EXAM: MR OF THE LEFT HEEL WITHOUT CONTRAST   TECHNIQUE: Multiplanar, multisequence MR imaging of the left heel was performed. No intravenous contrast was administered.   COMPARISON:  Left foot radiograph 04/20/2021   FINDINGS: TENDONS   Peroneal: Peroneal longus and brevis tendons are intact.   Posteromedial: Posterior tibial tendon intact. Flexor digitorum longus tendon intact. Flexor hallucis longus tendon intact.   Anterior: Tibialis anterior tendon intact. Extensor hallucis longus tendon intact Extensor digitorum longus tendon intact.   Achilles: Achilles tendon is intact. There is mild insertional Achilles thickening and Achilles enthesophyte formation.   Plantar Fascia: Intact.   LIGAMENTS   Lateral: Anterior talofibular ligament intact. Calcaneofibular ligament intact. Posterior talofibular ligament intact. Anterior and posterior tibiofibular ligaments intact.   Medial: Deltoid ligament intact. Spring ligament intact.   CARTILAGE   Ankle Joint: No significant joint effusion. Normal ankle mortise. No chondral defect.   Subtalar Joints/Sinus Tarsi: Normal subtalar joints. No subtalar joint effusion. Normal sinus tarsi.    Bones: No marrow signal abnormality.  No fracture or dislocation.   Soft Tissue: No fluid collection or hematoma. Muscles are normal without edema or atrophy. Tarsal tunnel is normal.   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   Intact ankle ligaments.  No acute tendon tear.     Electronically Signed   By: Maurine Simmering   On: 07/20/2021 15:15   Assessment:   No diagnosis found.    Plan:  Patient was evaluated and treated and all questions answered.  Discussed the etiology and treatment options for Achilles tendinitis including stretching, formal physical therapy with an eccentric exercises therapy plan, supportive shoegears such as a running shoe or sneaker, heel lifts, topical and oral medications.  We also discussed that I do not routinely perform injections in this area because of the risk of an increased damage or rupture of the tendon.  We also discussed the role of surgical treatment of this for patients who do not improve after exhausting non-surgical treatment options.  Has had some improvement she is continuing her therapy.  We reviewed the MRI results.  Discussed possibility of PRP injection and/or Topaz procedure and rationale behind this.  She will continue her home exercise plan and let me know how she is doing in a few weeks and if necessary we will plan for Topaz and PRP if she does not improve by the end of September  Return in about 7 weeks (around 09/16/2021).

## 2021-08-05 ENCOUNTER — Ambulatory Visit (INDEPENDENT_AMBULATORY_CARE_PROVIDER_SITE_OTHER): Payer: BC Managed Care – PPO | Admitting: Internal Medicine

## 2021-08-05 DIAGNOSIS — J019 Acute sinusitis, unspecified: Secondary | ICD-10-CM | POA: Diagnosis not present

## 2021-08-05 DIAGNOSIS — J45909 Unspecified asthma, uncomplicated: Secondary | ICD-10-CM

## 2021-08-05 MED ORDER — DOXYCYCLINE HYCLATE 100 MG PO CAPS
200.0000 mg | ORAL_CAPSULE | Freq: Every day | ORAL | 0 refills | Status: AC
Start: 2021-08-05 — End: 2021-08-10

## 2021-08-05 MED ORDER — DOXYCYCLINE HYCLATE 100 MG PO CAPS: 200.0000 mg | ORAL_CAPSULE | Freq: Every day | ORAL | 0 refills | Status: DC

## 2021-08-05 NOTE — Progress Notes (Signed)
   I connected with  Hermina Barters Kehres on 08/06/21 by telephone and verified that I am speaking with the correct person using two identifiers.   I discussed the limitations of evaluation and management by telemedicine. The patient expressed understanding and agreed to proceed.  CC: Sinusitis  This is a telephone encounter between [employer] and [employer] on 08/06/2021 for sinusitis. The visit was conducted with the patient located at home and [employer] [employer] at [facility]. The patient's identity was confirmed using their DOB and current address. The patient has consented to being evaluated through a telephone encounter and understands the associated risks (an examination cannot be done and the patient may need to come in for an appointment) / benefits (allows the patient to remain at home, decreasing exposure to coronavirus). I personally spent 12 minutes on medical discussion.   HPI:  Ms.Vanda Kenzleigh Weisbrodt is a 45 y.o. with PMH as below.   Please see A&P for assessment of the patient's acute and chronic medical conditions.   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  Patient discussed with Dr. Evette Doffing

## 2021-08-05 NOTE — Assessment & Plan Note (Addendum)
Assessment: Patient presents via telehealth appointment for evaluation and management of sinus congestion.  Patient states that she has had a roughly 7-day history of productive cough, sinus pressure and pain, sinus congestion, and sneezing that started after cleaning/paint a room at her house.  She endorses a significant history of sinusitis and atopy including asthma and allergic rhinitis. She previously had multiple sinus infections throughout the year and as a result had sinus surgery in 2014. Since then she has roughly 1-2 sinus infections each years but states they will often progress to bronchitis, requiring antibiotic therapy. Currently, she denies any symptoms of chest pain, shortness of breath, fevers, chills, myalgias, recent travel, or sick contacts.   Her current outpatient medications for asthma include albuterol (rescue inhaler/nebulizer) and Breztri. She endorses needing her rescue inhaler more frequently over the past 7 days.  Additionally she is on Zyrtec, famotidine, and montelukast for her allergic rhinitis.  Patient's symptoms are likely secondary to viral sinusitis complicated by her significant history of asthma and allergic rhinitis.  Plan: I counseled her regarding the likely viral etiology of her sinus symptoms. I encouraged her to get tested for COVID-19.  I recommended consistent use of intranasal steroid and sinus irrigation daily. I counseled her on the risks and benefits of using over-the-counter cold medications such as guaifenesin and dextromethorphan for her symptoms.  Additionally, if her symptoms do not improve by the weekend or significantly worsen, then an antibiotic will be available for her to pick up at her pharmacy.  Considering her history of allergies to penicillin and cephalosporins, I will put in a 5-day course of doxycycline for her. I discussed strict return precautions regarding worsening of her symptoms, particularly worsening shortness of breath and  wheezing.  - COVID-19 testing - Intranasal steroid daily - Regular sinus irrigation  - Consider guaifenesin and dextromethorphan  - Continue treatment for asthma/allergic rhinitis  - Doxycyline 200 mg daily for 5 days if symptoms do not improve.  - Strict return precautions.

## 2021-08-06 ENCOUNTER — Encounter: Payer: BC Managed Care – PPO | Admitting: Internal Medicine

## 2021-08-06 ENCOUNTER — Encounter: Payer: Self-pay | Admitting: Internal Medicine

## 2021-08-06 NOTE — Progress Notes (Signed)
Internal Medicine Clinic Attending  Case discussed with Dr. Coe  At the time of the visit.  We reviewed the resident's history and exam and pertinent patient test results.  I agree with the assessment, diagnosis, and plan of care documented in the resident's note.  

## 2021-08-19 ENCOUNTER — Ambulatory Visit: Payer: BC Managed Care – PPO | Admitting: Internal Medicine

## 2021-08-19 ENCOUNTER — Encounter: Payer: Self-pay | Admitting: Internal Medicine

## 2021-08-19 DIAGNOSIS — G4700 Insomnia, unspecified: Secondary | ICD-10-CM | POA: Insufficient documentation

## 2021-08-19 DIAGNOSIS — F5102 Adjustment insomnia: Secondary | ICD-10-CM

## 2021-08-19 NOTE — Progress Notes (Signed)
   CC: Difficulty sleeping  HPI:  [name] is a 45 y.o. person, with a PMH noted below, who presents to the clinic for difficulty falling and staying asleep. To see the management of their acute and chronic conditions, please see the A&P note under the Encounters tab.   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 Negative for chills, fever and weight loss.  Respiratory:  Negative for cough.   Cardiovascular:  Negative for palpitations.  Gastrointestinal:  Negative for abdominal pain, diarrhea, nausea and vomiting.  Psychiatric/Behavioral:  The patient has insomnia.        Endorses difficulty sleeping, focusing, and being tired throughout the day    Physical Exam:  Vitals:   08/19/21 1333  BP: 136/64  Pulse: 93  Temp: 98 F (36.7 C)  TempSrc: Oral  SpO2: 99%  Weight: 187 lb 6.4 oz (85 kg)  Height: '5\' 4"'$  (1.626 m)   Physical Exam Constitutional:      General: She is not in acute distress.    Appearance: Normal appearance. She is not ill-appearing, toxic-appearing or diaphoretic.     Comments: Appears in no acute distress, answers questions appropriately  Cardiovascular:     Rate and Rhythm: Normal rate and regular rhythm.     Pulses: Normal pulses.     Heart  sounds: Normal heart sounds. No murmur heard.   No friction rub. No gallop.  Pulmonary:     Effort: Pulmonary effort is normal. No respiratory distress.     Breath sounds: Normal breath sounds. No wheezing.  Neurological:     Mental Status: She is alert and oriented to person, place, and time.  Psychiatric:        Mood and Affect: Mood normal.        Behavior: Behavior normal.     Assessment & Plan:   See Encounters Tab for problem based charting.  Patient discussed with Dr. Daryll Drown

## 2021-08-19 NOTE — Assessment & Plan Note (Signed)
Patient presents to the clinic with difficulty falling and staying asleep for the past month.  Patient states that due to difficulty sleeping she is now having difficulty focusing at work, and has noticed that her mood fluctuates even when she is not menstruating.  She states that she typically goes to bed at 10:30-10 30 p.m. and it typically takes her 2 to 3 hours to fall asleep.  When she does fall asleep, she awakens every other hour.  Her sleep habits include going to bed in a dark room with no lights, no animals (she has 3 cats) in the bedroom, TV, reading, and keeps her bedroom cool and utilizes a sound machine.  She has tried using melatonin, which is giving her nightmares.  She states that her sound machine (rainforest the rain noises) occasionally helps her sleep better at night. She states that she does not want to rely on medications to help her sleep.  She has used Ambien in the past, which did help her sleep but would like to try other means of treatment.  She does endorse an increased workload at work as the season is ramping up. She is also moved into her significant other's living quarters this past month. She is used to sleeping in a bed by herself.  A/P: [facility] secondary to new adjustments including moving in with significant other and increased workload.  She does not use caffeine excessively, her sleep hygiene is good.  Discussed CBT and meeting with Dr. Carolynne Edouard in order to work through her insomnia.  Patient agrees with the plan. - Referral to Dr. Carolynne Edouard.

## 2021-08-19 NOTE — Patient Instructions (Addendum)
To [name],   It was a pleasure seeing you again! Today we discussed your difficulty falling and staying asleep. After our discussion it appears your difficulty sleeping is secondary to increased work stress and your recent move. I will have you speak with Dr. Theodis Shove in our clinic to assist with your difficulty sleeping. I will see you back in 3 months' time. Have a relaxing Labor Day weekend!  Maudie Mercury, MD

## 2021-08-20 NOTE — Progress Notes (Signed)
Internal Medicine Clinic Attending ? ?Case discussed with Dr. Winters  At the time of the visit.  We reviewed the resident?s history and exam and pertinent patient test results.  I agree with the assessment, diagnosis, and plan of care documented in the resident?s note.  ?

## 2021-08-27 DIAGNOSIS — M79672 Pain in left foot: Secondary | ICD-10-CM | POA: Diagnosis not present

## 2021-08-27 DIAGNOSIS — R531 Weakness: Secondary | ICD-10-CM | POA: Diagnosis not present

## 2021-08-27 DIAGNOSIS — M7662 Achilles tendinitis, left leg: Secondary | ICD-10-CM | POA: Diagnosis not present

## 2021-08-27 DIAGNOSIS — R2689 Other abnormalities of gait and mobility: Secondary | ICD-10-CM | POA: Diagnosis not present

## 2021-08-31 ENCOUNTER — Encounter: Payer: Self-pay | Admitting: Internal Medicine

## 2021-09-01 ENCOUNTER — Ambulatory Visit: Payer: BC Managed Care – PPO | Admitting: Allergy

## 2021-09-03 DIAGNOSIS — R2689 Other abnormalities of gait and mobility: Secondary | ICD-10-CM | POA: Diagnosis not present

## 2021-09-03 DIAGNOSIS — M7662 Achilles tendinitis, left leg: Secondary | ICD-10-CM | POA: Diagnosis not present

## 2021-09-03 DIAGNOSIS — M79672 Pain in left foot: Secondary | ICD-10-CM | POA: Diagnosis not present

## 2021-09-03 DIAGNOSIS — R531 Weakness: Secondary | ICD-10-CM | POA: Diagnosis not present

## 2021-09-05 ENCOUNTER — Other Ambulatory Visit: Payer: Self-pay

## 2021-09-05 ENCOUNTER — Emergency Department (HOSPITAL_BASED_OUTPATIENT_CLINIC_OR_DEPARTMENT_OTHER)
Admission: EM | Admit: 2021-09-05 | Discharge: 2021-09-05 | Disposition: A | Discharge status: Home / Self Care | Payer: BC Managed Care – PPO | Attending: Emergency Medicine | Admitting: Emergency Medicine

## 2021-09-05 ENCOUNTER — Encounter (HOSPITAL_BASED_OUTPATIENT_CLINIC_OR_DEPARTMENT_OTHER): Payer: Self-pay | Admitting: *Deleted

## 2021-09-05 DIAGNOSIS — M79662 Pain in left lower leg: Secondary | ICD-10-CM | POA: Insufficient documentation

## 2021-09-05 DIAGNOSIS — Z85828 Personal history of other malignant neoplasm of skin: Secondary | ICD-10-CM | POA: Diagnosis not present

## 2021-09-05 DIAGNOSIS — J45909 Unspecified asthma, uncomplicated: Secondary | ICD-10-CM | POA: Insufficient documentation

## 2021-09-05 LAB — CBC WITH DIFFERENTIAL/PLATELET
Abs Immature Granulocytes: 0.03 10*3/uL (ref 0.00–0.07)
Basophils Absolute: 0 10*3/uL (ref 0.0–0.1)
Basophils Relative: 0 %
Eosinophils Absolute: 0.3 10*3/uL (ref 0.0–0.5)
Eosinophils Relative: 4 %
HCT: 40.5 % (ref 36.0–46.0)
Hemoglobin: 13.6 g/dL (ref 12.0–15.0)
Immature Granulocytes: 0 %
Lymphocytes Relative: 26 %
Lymphs Abs: 2.1 10*3/uL (ref 0.7–4.0)
MCH: 29.3 pg (ref 26.0–34.0)
MCHC: 33.6 g/dL (ref 30.0–36.0)
MCV: 87.3 fL (ref 80.0–100.0)
Monocytes Absolute: 0.4 10*3/uL (ref 0.1–1.0)
Monocytes Relative: 5 %
Neutro Abs: 5 10*3/uL (ref 1.7–7.7)
Neutrophils Relative %: 65 %
Platelets: 275 10*3/uL (ref 150–400)
RBC: 4.64 MIL/uL (ref 3.87–5.11)
RDW: 11.7 % (ref 11.5–15.5)
WBC: 7.8 10*3/uL (ref 4.0–10.5)
nRBC: 0 % (ref 0.0–0.2)

## 2021-09-05 LAB — BASIC METABOLIC PANEL
Anion gap: 6 (ref 5–15)
BUN: 23 mg/dL — ABNORMAL HIGH (ref 6–20)
CO2: 24 mmol/L (ref 22–32)
Calcium: 8.3 mg/dL — ABNORMAL LOW (ref 8.9–10.3)
Chloride: 105 mmol/L (ref 98–111)
Creatinine, Ser: 0.71 mg/dL (ref 0.44–1.00)
GFR, Estimated: >60 mL/min (ref >60)
Glucose, Bld: 96 mg/dL (ref 70–99)
Potassium: 4 mmol/L (ref 3.5–5.1)
Sodium: 135 mmol/L (ref 135–145)

## 2021-09-05 LAB — MAGNESIUM: Magnesium: 2.2 mg/dL (ref 1.7–2.4)

## 2021-09-05 LAB — D-DIMER, QUANTITATIVE: D-Dimer, Quant: <0.27 ug/mL-FEU (ref 0.00–0.50)

## 2021-09-05 MED ORDER — TRAMADOL HCL 50 MG PO TABS: 50.0000 mg | ORAL_TABLET | Freq: Four times a day (QID) | ORAL | 0 refills | Status: DC | PRN

## 2021-09-05 MED ORDER — TRAMADOL HCL 50 MG PO TABS
50.0000 mg | ORAL_TABLET | Freq: Once | ORAL | Status: AC
  Administered 2021-09-05: 50 mg via ORAL
  Filled 2021-09-05: qty 1

## 2021-09-05 NOTE — ED Triage Notes (Signed)
Pt had a Charlie horse since Friday after PT.  Pt stated that it is progressively getting worst.

## 2021-09-05 NOTE — ED Provider Notes (Signed)
[facility] EMERGENCY DEPARTMENT Provider Note  CSN: [id number] Arrival date & time: 09/05/21 [id number]    History Chief Complaint  Patient presents with   Leg Pain    Erin Collins is a 45 y.o. female with history of achilles tendonitis, followed by Podiatry and currently doing PT for same. She reports after her PT session on Friday she began to have worsening pain, describes as a 'charley horse' like cramping/soreness in her L calf. Pain is worse with movement, unable to bear weight. Radiates from ankle to behind her knee. She has tried Meloxicam and flexeril without improvement. She does sometimes have soreness after PT but it does not usually last this long. She is worried about a clot because she had a superficial thrombophlebitis in a breast vein several years ago (treated with ASA).    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   "bland stare"   Torticollis    Vertigo     Past Surgical History:  Procedure Laterality Date   BASAL CELL CARCINOMA EXCISION Right ~ 1999   "collarbone"   DILATION AND CURETTAGE OF UTERUS  2005   LAPAROSCOPIC ENDOMETRIOSIS FULGURATION  2005   NASAL SEPTOPLASTY W/ TURBINOPLASTY Bilateral 2014   SINOSCOPY     TONSILLECTOMY  1980   TUBAL LIGATION Right 2005   TYMPANOSTOMY TUBE PLACEMENT      Family History  Problem Relation Age of Onset   Allergic rhinitis  Mother    Asthma Mother     Social History   Tobacco Use   Smoking status: Never   Smokeless tobacco: Never  Vaping Use   Vaping Use: Never used  Substance Use Topics   Alcohol use: Not Currently    Comment: 12/18/2017 "maybe 1/year on my birthday"   Drug use: No     Home Medications Prior to Admission medications   Medication Sig Start Date End Date Taking? Authorizing Provider  cyclobenzaprine (FLEXERIL) 10 MG tablet Take 1 tablet (10 mg total) by mouth 3 (three) times daily as needed for muscle spasms. 06/29/21  Yes McDonald, Stephan Minister, DPM  meloxicam (MOBIC) 15 MG tablet Take 1 tablet (15 mg total) by mouth daily. 06/29/21  Yes McDonald, Adam R, DPM  montelukast (SINGULAIR) 10 MG tablet Take 1 tablet (10 mg total) by mouth at bedtime. 04/16/21  Yes Garnet Sierras, DO  traMADol (ULTRAM) 50 MG tablet Take 1 tablet (50 mg total) by mouth every 6 (six) hours as needed. 09/05/21  Yes Truddie Hidden, MD  VENTOLIN HFA 108 [phone] Base) MCG/ACT inhaler INHALE 1 TO 2 PUFFS BY MOUTH EVERY 6 HOURS AS NEEDED FOR WHEEZING OR SHORTNESS OF BREATH 06/22/21  Yes Garnet Sierras, DO  albuterol (PROVENTIL) (2.5 MG/3ML) 0.083% nebulizer solution Take 3 mLs (  2.5 mg total) by nebulization every 4 (four) hours as needed for wheezing or shortness of breath (coughing fits). 04/16/21   Garnet Sierras, DO  Budeson-Glycopyrrol-Formoterol (BREZTRI AEROSPHERE) 160-9-4.8 MCG/ACT AERO Inhale 2 puffs into the lungs in the morning and at bedtime. with spacer and rinse mouth afterwards. 04/16/21   Garnet Sierras, DO     Allergies    Amoxicillin, Cefuroxime axetil, Hydrocodone, Levofloxacin, Morphine, Oxycodone-acetaminophen, Penicillins, and Propoxyphene   Review of Systems   Review of Systems A comprehensive review of systems was completed and negative except as noted in HPI.    Physical Exam BP 109/70   Pulse 77   Temp 98.3 F (36.8 C) (Oral)   Resp 15   Ht '5\' 4"'$  (1.626 m)   Wt 83.5 kg   SpO2 96%   BMI 31.58 kg/m    Physical Exam Vitals and nursing note reviewed.  Constitutional:      Appearance: Normal appearance.  HENT:     Head: Normocephalic and atraumatic.     Nose: Nose normal.     Mouth/Throat:     Mouth: Mucous membranes are moist.  Eyes:     Extraocular Movements: Extraocular movements intact.     Conjunctiva/sclera: Conjunctivae normal.  Cardiovascular:     Rate and Rhythm: Normal rate.  Pulmonary:     Effort: Pulmonary effort is normal.     Breath sounds: Normal breath sounds.  Abdominal:     General: Abdomen is flat.     Palpations: Abdomen is soft.     Tenderness: There is no abdominal tenderness.  Musculoskeletal:        General: Tenderness present. No swelling.     Cervical back: Neck supple.     Comments: Diffuse soft tissue tenderness to L calf, compartments are soft, ROM decreased due to pain; distal pulse is normal.   Skin:    General: Skin is warm and dry.  Neurological:     General: No focal deficit present.     Mental Status: She is alert.  Psychiatric:        Mood and Affect: Mood normal.     ED Results / Procedures / Treatments   Labs (all labs ordered are listed, but only abnormal results are displayed) Labs Reviewed  BASIC METABOLIC PANEL - Abnormal; Notable for the following components:      Result Value   BUN 23 (*)    Calcium 8.3 (*)    All other components within normal limits  CBC WITH DIFFERENTIAL/PLATELET  D-DIMER, QUANTITATIVE  MAGNESIUM    EKG None   Radiology No results found.  Procedures Procedures  Medications Ordered in the ED Medications  traMADol (ULTRAM) tablet 50 mg (50 mg Oral Given 09/05/21 0949)     MDM Rules/Calculators/A&P MDM Patient with persistent L calf pain/cramping after recent PT. She is concerned about DVT, unfortunately, [place] is not available at this time. Will check labs, including Dimer to evaluation. Tramadol for pain.   ED Course  I have reviewed the triage vital signs and the nursing  notes.  Pertinent labs & imaging results that were available during my care of the patient were reviewed by me and considered in my medical decision making (see chart for details).  Clinical Course as of 09/05/21 1051  Sun Sep 05, 2021  1009 CBC is normal.  [CS]  1017 CMP with mild hypocalcemia, K and Mg are normal.  [CS]  1035 Dimer is neg. Will recommend she take Calcium supplement, use  crutches to stay off her L leg. She has a boot she can wear for comfort. Rx for Tramadol as needed for pain. PCP/Podiatry follow up.  [CS]    Clinical Course User Index [CS] Truddie Hidden, MD    Final Clinical Impression(s) / ED Diagnoses Final diagnoses:  Pain of left calf    Rx / DC Orders ED Discharge Orders          Ordered    traMADol (ULTRAM) 50 MG tablet  Every 6 hours PRN        09/05/21 1046             Truddie Hidden, MD 09/05/21 1051

## 2021-09-10 ENCOUNTER — Telehealth: Payer: Self-pay | Admitting: Allergy

## 2021-09-10 MED ORDER — VENTOLIN HFA 108 (90 BASE) MCG/ACT IN AERS: INHALATION_SPRAY | RESPIRATORY_TRACT | 0 refills | Status: DC

## 2021-09-10 NOTE — Telephone Encounter (Signed)
Patient is requesting a refill on her Ventolin inhaler. Patient has an OV on 10/4 in [facility] with Dr. Maudie Mercury.

## 2021-09-10 NOTE — Telephone Encounter (Signed)
Spoke to patient and informed her that a courtesy refill will be sent in. Patient verbalized understanding and expressed that she has been without her rescue inhaler for three days and the changes in the weather cause her to have flare ups.

## 2021-09-15 ENCOUNTER — Ambulatory Visit
Admission: RE | Admit: 2021-09-15 | Discharge: 2021-09-15 | Disposition: A | Discharge status: Home / Self Care | Payer: BC Managed Care – PPO | Source: Ambulatory Visit | Attending: Certified Nurse Midwife | Admitting: Certified Nurse Midwife

## 2021-09-15 ENCOUNTER — Other Ambulatory Visit: Payer: Self-pay

## 2021-09-15 DIAGNOSIS — Z01419 Encounter for gynecological examination (general) (routine) without abnormal findings: Secondary | ICD-10-CM

## 2021-09-16 ENCOUNTER — Ambulatory Visit (INDEPENDENT_AMBULATORY_CARE_PROVIDER_SITE_OTHER): Payer: BC Managed Care – PPO | Admitting: Podiatry

## 2021-09-16 ENCOUNTER — Other Ambulatory Visit (INDEPENDENT_AMBULATORY_CARE_PROVIDER_SITE_OTHER): Payer: BC Managed Care – PPO

## 2021-09-16 ENCOUNTER — Ambulatory Visit: Payer: BC Managed Care – PPO | Admitting: Behavioral Health

## 2021-09-16 DIAGNOSIS — M7662 Achilles tendinitis, left leg: Secondary | ICD-10-CM | POA: Diagnosis not present

## 2021-09-16 DIAGNOSIS — M62831 Muscle spasm of calf: Secondary | ICD-10-CM | POA: Diagnosis not present

## 2021-09-16 DIAGNOSIS — N912 Amenorrhea, unspecified: Secondary | ICD-10-CM

## 2021-09-16 DIAGNOSIS — M216X2 Other acquired deformities of left foot: Secondary | ICD-10-CM

## 2021-09-16 DIAGNOSIS — F331 Major depressive disorder, recurrent, moderate: Secondary | ICD-10-CM

## 2021-09-16 DIAGNOSIS — F5102 Adjustment insomnia: Secondary | ICD-10-CM

## 2021-09-16 DIAGNOSIS — F419 Anxiety disorder, unspecified: Secondary | ICD-10-CM

## 2021-09-16 DIAGNOSIS — M21862 Other specified acquired deformities of left lower leg: Secondary | ICD-10-CM

## 2021-09-16 LAB — POCT URINE PREGNANCY: Preg Test, Ur: NEGATIVE

## 2021-09-16 MED ORDER — TIZANIDINE HCL 4 MG PO TABS: 4.0000 mg | ORAL_TABLET | Freq: Every day | ORAL | 0 refills | Status: DC

## 2021-09-16 NOTE — BH Specialist Note (Signed)
Integrated Behavioral Health via Telemedicine Visit  09/16/2021 Erin Collins [id number]  Number of [place] visits: 1/6 Session Start time: 3:00pm  Session End time: 3:40pm Total time: 40   Referring Provider: Dr. Maudie Mercury, MD Patient/Family location: Pt is working from home today in private for [place] session of [facility] Provider location: Working remotely in private All persons participating in visit: Pt & Clinician Types of Service: Introduction only  I connected with [place] and/or Erin Collins's  self  via  Telephone or Video Enabled Telemedicine Application  (Video is Caregility application) and verified that I am speaking with the correct person using two identifiers. Discussed confidentiality: Yes   I discussed the limitations of telemedicine and the availability of in person appointments.  Discussed there is a possibility of technology failure and discussed alternative modes of communication if that failure occurs.  I discussed that engaging in this telemedicine visit, they consent to the provision of behavioral healthcare and the services will be billed under their insurance.  Patient and/or legal guardian expressed understanding and consented to Telemedicine visit: Yes   Presenting Concerns: Patient and/or family reports the following symptoms/concerns: elevated anx/dep due to work stressors & family life @ home which invls her Erin Collins w/whom she lives & his 2 Teen Dtrs. Mom is invld & she has to unexpectedly encounter her @ times. Pt has a 98yo Son who lives in [place]. Duration of problem: months ; Severity of problem: mild trending moderate  Patient and/or Family's Strengths/Protective Factors: Social connections, Social and Emotional competence, Concrete supports in place (healthy food, safe environments, etc.), Sense of purpose, Physical Health (exercise, healthy diet, medication compliance, etc.), Caregiver has  knowledge of parenting & child development, and Parental Resilience  Goals Addressed: Patient will:  Reduce symptoms of: anxiety, depression, insomnia, and stress   Increase knowledge and/or ability of: coping skills and stress reduction. Parenting skills for blended families.  Demonstrate ability to: Increase healthy adjustment to current life circumstances  Progress towards Goals: Estb'd today; Pt will attend psychotherapy for the time needed to address her healthy adjustment to current blended family living situation; normalize & validate her exp.  Interventions: Interventions utilized:  Solution-Focused Strategies, Mindfulness or Relaxation Training, and Supportive Reflection Standardized Assessments completed: Not Needed  Patient and/or Family Response: Pt receptive to call today & requests future appts for coping skills & tools for Parenting effectively w/Teens.  Assessment: Patient currently experiencing elevated anx/dep due to work stressors, home stressors, sleep issues, & uncertainty about her pregnancy status.   Patient may benefit from cont'd support for her situation, coping skills, & Parenting tips.  Plan: Follow up with behavioral health clinician on : 2-3 wks for 60 min on telehealth. Behavioral recommendations: Keep notebook btwn sessions Referral(s): [place] (In Clinic)  I discussed the assessment and treatment plan with the patient and/or parent/guardian. They were provided an opportunity to ask questions and all were answered. They agreed with the plan and demonstrated an understanding of the instructions.   They were advised to call back or seek an in-person evaluation if the symptoms worsen or if the condition fails to improve as anticipated.  Erin Hutching, LMFT

## 2021-09-17 NOTE — Progress Notes (Signed)
Subjective:  Patient ID: Erin Collins, female    DOB: Jul 13, 1976,  MRN: [id number]  Chief Complaint  Patient presents with   Follow-up    Left foot follow up tendonitis.     45 y.o. female returns for follow-up with the above complaint.  She has been doing physical therapy which has been very helpful but last time she went her normal therapist was out and no drug management was possible.  She has been having very severe leg cramps since then the Sunday should go to the ER because of this.  Objective:  Physical Exam: warm, good capillary refill, no trophic changes or ulcerative lesions, normal DP and PT pulses and normal sensory exam. Left Foot: Sharp pain on palpation of the distal Achilles tendon at the insertion and in the mid substance  Radiographs: X-ray of the left foot: Small plantar and posterior calcaneal enthesophytes  Study Result  Narrative & Impression  CLINICAL DATA:  Achilles tendinitis   EXAM: MR OF THE LEFT HEEL WITHOUT CONTRAST   TECHNIQUE: Multiplanar, multisequence MR imaging of the left heel was performed. No intravenous contrast was administered.   COMPARISON:  Left foot radiograph 04/20/2021   FINDINGS: TENDONS   Peroneal: Peroneal longus and brevis tendons are intact.   Posteromedial: Posterior tibial tendon intact. Flexor digitorum longus tendon intact. Flexor hallucis longus tendon intact.   Anterior: Tibialis anterior tendon intact. Extensor hallucis longus tendon intact Extensor digitorum longus tendon intact.   Achilles: Achilles tendon is intact. There is mild insertional Achilles thickening and Achilles enthesophyte formation.   Plantar Fascia: Intact.   LIGAMENTS   Lateral: Anterior talofibular ligament intact. Calcaneofibular ligament intact. Posterior talofibular ligament intact. Anterior and posterior tibiofibular ligaments intact.   Medial: Deltoid ligament intact. Spring ligament intact.   CARTILAGE   Ankle Joint: No  significant joint effusion. Normal ankle mortise. No chondral defect.   Subtalar Joints/Sinus Tarsi: Normal subtalar joints. No subtalar joint effusion. Normal sinus tarsi.   Bones: No marrow signal abnormality.  No fracture or dislocation.   Soft Tissue: No fluid collection or hematoma. Muscles are normal without edema or atrophy. Tarsal tunnel is normal.   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   Intact ankle ligaments.  No acute tendon tear.     Electronically Signed   By: Maurine Simmering   On: 07/20/2021 15:15   Assessment:   1. Tendonitis, Achilles, left   2. Gastrocnemius equinus of left lower extremity   3. Muscle spasm of left calf       Plan:  Patient was evaluated and treated and all questions answered.  Discussed the etiology and treatment options for Achilles tendinitis including stretching, formal physical therapy with an eccentric exercises therapy plan, supportive shoegears such as a running shoe or sneaker, heel lifts, topical and oral medications.  We also discussed that I do not routinely perform injections in this area because of the risk of an increased damage or rupture of the tendon.  We also discussed the role of surgical treatment of this for patients who do not improve after exhausting non-surgical treatment options.  Still very painful for her and she is now having significant cramps of the calf muscle.  She took 2 Flexeril on Sunday and this did not help.  I prescribed her tizanidine today to see if this alleviates any of the cramping she is having.  I will hopefully get through this and continue her therapy program.  If its not better in about  2 weeks I think we need to seriously consider a Topaz and PRP injection procedure  Return in about 6 weeks (around 10/28/2021).

## 2021-09-20 ENCOUNTER — Telehealth: Payer: Self-pay | Admitting: *Deleted

## 2021-09-20 NOTE — Telephone Encounter (Signed)
Patient is calling because her leg is getting worse, calf continues to cramp, nothing is alleviating the pain,ready to discuss surgery options. Should a sooner appointment be scheduled?(10/28/21)Please advise.

## 2021-09-20 NOTE — Progress Notes (Deleted)
Follow Up Note  RE: Erin Collins MRN: [id number] DOB: 24-Jul-1976 Date of Office Visit: 09/21/2021  Referring provider: Maudie Mercury, MD Primary care provider: Maudie Mercury, MD  Chief Complaint: No chief complaint on file.  History of Present Illness: I had the pleasure of seeing Erin Collins for a follow up visit at the [facility] [place] on 09/20/2021. She is a 45 y.o. female, who is being followed for asthma, allergic rhinoconjunctivitis, heartburn, multiple drug allergies and atopic dermatitis. Her previous allergy office visit was on 04/16/2021 with Dr. Maudie Mercury. Today is a regular follow up visit.  Bloodwork positive to dust mites, cat, dog, grass pollen. Borderline to cockroach, mouse and maple/box elder tree pollen.    Will discuss starting allergy injections at next visit once asthma is more stable. Continue meds as per last visit. See below for environmental control measures.  Not well controlled moderate persistent asthma Past history - Diagnosed with asthma over 19 years ago. Patient moved in with her parents who have cats and dogs at home during this time. Did not tolerate Trelegy.  Interim history - symptoms flared 1 month ago and restarted Breo, Spiriva but once ran out of Plover got worse. Using albuterol throughout the day with good benefit. Usually flares in the spring and fall. Today's spirometry showed some restriction with 15% improvement in FEV1 post bronchodilator treatment. Clinically feeling improved.  ACT score 7. Use albuterol nebulizer twice a day for the next 1 week before using Breztri. Daily controller medication(s): START Breztri 2 puffs twice a day with spacer and rinse mouth afterwards. Samples given.  Spacer given and demonstrated proper use with inhaler. Patient understood technique and all questions/concerned were addressed.  Continue Singulair 10mg  daily May use albuterol rescue inhaler 2 puffs or nebulizer every 4 to 6 hours as needed for  shortness of breath, chest tightness, coughing, and wheezing. May use albuterol rescue inhaler 2 puffs 5 to 15 minutes prior to strenuous physical activities. Monitor frequency of use.  Repeat spirometry at next visit. Declines prednisone due to side effects and osteoporosis.  Will need daily maintenance inhaler during the spring and fall months.  If above regimen not effective, consider adding biologics next.    Seasonal and perennial allergic rhinoconjunctivitis Past history - Perennial rhinoconjunctivitis symptoms for the past 20 years with worsening in the spring and fall.  Patient has tried Zyrtec, Singulair, Nasacort, saline nasal spray and Zaditor eyedrops with some benefit.  Patient used to be on allergy immunotherapy for 10 years with good benefit. 2019 skin testing showed, positive to dust mites, cat, dog, grass and horse. 2 indoor cats at home.  Interim history - symptoms flared, trying to stay indoors. Wants to start injections. Continue environmental control measures - especially regarding the cats. Continue Singulair (montelukast) 10mg  daily.  May use over the counter antihistamines such as Zyrtec (cetirizine), Claritin (loratadine), Allegra (fexofenadine), or Xyzal (levocetirizine) daily as needed. May take twice a day if needed. May use Flonase sensamist 2 sprays per nostril once day for nasal congestion as needed.  May use azelastine nasal spray 1-2 sprays per nostril twice a day as needed for runny nose/drainage. Nasal saline spray (i.e., Simply Saline) or nasal saline lavage (i.e., NeilMed) is recommended as needed and prior to medicated nasal sprays. May use Zaditor eye drops as needed for itchy/watery eyes.  Consider allergy injections for long term control if above medications do not help the symptoms - handout given. Start once asthma is more  stable. Get bloodwork as unable to skin test today and last skin testing was not positive to spring time allergens.     Heartburn Asymptomatic with no meds. Monitor symptoms.   Multiple drug allergies Past history - reactions to amoxicillin, cefuroxime, hydrocodone, levofloxacin, morphine, oxycodone, and propoxyphene in the past.  Continue to avoid all these medications.    Other atopic dermatitis Past history - Eucrisa not effective.  Continue proper skin care measures.  May use triamcinolone 0.1% ointment twice a day as needed for the hands. Do not use on the face, neck, armpits or groin area. Do not use more than 3 weeks in a row.   Assessment and Plan: Erin Collins is a 45 y.o. female with: No problem-specific Assessment & Plan notes found for this encounter.  No follow-ups on file.  No orders of the defined types were placed in this encounter.  Lab Orders  No laboratory test(s) ordered today    Diagnostics: Spirometry:  Tracings reviewed. Her effort: {Blank single:19197::"Good reproducible efforts.","It was hard to get consistent efforts and there is a question as to whether this reflects a maximal maneuver.","Poor effort, data can not be interpreted."} FVC: ***L FEV1: ***L, ***% predicted FEV1/FVC ratio: ***% Interpretation: {Blank single:19197::"Spirometry consistent with mild obstructive disease","Spirometry consistent with moderate obstructive disease","Spirometry consistent with severe obstructive disease","Spirometry consistent with possible restrictive disease","Spirometry consistent with mixed obstructive and restrictive disease","Spirometry uninterpretable due to technique","Spirometry consistent with normal pattern","No overt abnormalities noted given today's efforts"}.  Please see scanned spirometry results for details.  Skin Testing: {Blank single:19197::"Select foods","Environmental allergy panel","Environmental allergy panel and select foods","Food allergy panel","None","Deferred due to recent antihistamines use"}. *** Results discussed with patient/family.   Medication List:   Current Outpatient Medications  Medication Sig Dispense Refill   albuterol (PROVENTIL) (2.5 MG/3ML) 0.083% nebulizer solution Take 3 mLs (2.5 mg total) by nebulization every 4 (four) hours as needed for wheezing or shortness of breath (coughing fits). 75 mL 2   Budeson-Glycopyrrol-Formoterol (BREZTRI AEROSPHERE) 160-9-4.8 MCG/ACT AERO Inhale 2 puffs into the lungs in the morning and at bedtime. with spacer and rinse mouth afterwards. 10.7 g 5   cyclobenzaprine (FLEXERIL) 10 MG tablet Take 1 tablet (10 mg total) by mouth 3 (three) times daily as needed for muscle spasms. 30 tablet 0   meloxicam (MOBIC) 15 MG tablet Take 1 tablet (15 mg total) by mouth daily. 30 tablet 3   montelukast (SINGULAIR) 10 MG tablet Take 1 tablet (10 mg total) by mouth at bedtime. 30 tablet 5   tiZANidine (ZANAFLEX) 4 MG tablet Take 1 tablet (4 mg total) by mouth at bedtime. 30 tablet 0   traMADol (ULTRAM) 50 MG tablet Take 1 tablet (50 mg total) by mouth every 6 (six) hours as needed. 10 tablet 0   VENTOLIN HFA 108 (90 Base) MCG/ACT inhaler INHALE 1 TO 2 PUFFS BY MOUTH EVERY 6 HOURS AS NEEDED FOR WHEEZING OR SHORTNESS OF BREATH 8 g 0   No current facility-administered medications for this visit.   Allergies: Allergies  Allergen Reactions   Amoxicillin Hives   Cefuroxime Axetil Hives   Hydrocodone Itching   Levofloxacin Other (See Comments)    tendonitis   Morphine Itching   Oxycodone-Acetaminophen Itching   Penicillins Hives    Has patient had a PCN reaction causing immediate rash, facial/tongue/throat swelling, SOB or lightheadedness with hypotension: Yes Has patient had a PCN reaction causing severe rash involving mucus membranes or skin necrosis: No Has patient had a PCN reaction that required hospitalization: No Has  patient had a PCN reaction occurring within the last 10 years: No If all of the above answers are "NO", then may proceed with Cephalosporin use.    Propoxyphene Itching   I reviewed her past  medical history, social history, family history, and environmental history and no significant changes have been reported from her previous visit.  Review of Systems  Objective: There were no vitals taken for this visit. There is no height or weight on file to calculate BMI. Physical Exam Previous notes and tests were reviewed. The plan was reviewed with the patient/family, and all questions/concerned were addressed.  It was my pleasure to see Erin Collins today and participate in her care. Please feel free to contact me with any questions or concerns.  Sincerely,  Rexene Alberts, DO Allergy & Immunology  [facility] office: [place] office: [phone]

## 2021-09-21 ENCOUNTER — Ambulatory Visit: Payer: BC Managed Care – PPO | Admitting: Allergy

## 2021-09-21 DIAGNOSIS — Z889 Allergy status to unspecified drugs, medicaments and biological substances status: Secondary | ICD-10-CM

## 2021-09-21 DIAGNOSIS — L2089 Other atopic dermatitis: Secondary | ICD-10-CM

## 2021-09-21 DIAGNOSIS — H1010 Acute atopic conjunctivitis, unspecified eye: Secondary | ICD-10-CM

## 2021-09-21 DIAGNOSIS — R12 Heartburn: Secondary | ICD-10-CM

## 2021-09-24 DIAGNOSIS — R531 Weakness: Secondary | ICD-10-CM | POA: Diagnosis not present

## 2021-09-24 DIAGNOSIS — R2689 Other abnormalities of gait and mobility: Secondary | ICD-10-CM | POA: Diagnosis not present

## 2021-09-24 DIAGNOSIS — M7662 Achilles tendinitis, left leg: Secondary | ICD-10-CM | POA: Diagnosis not present

## 2021-09-24 DIAGNOSIS — M79672 Pain in left foot: Secondary | ICD-10-CM | POA: Diagnosis not present

## 2021-09-24 NOTE — Telephone Encounter (Signed)
Called and lvm to see if patient wanted to come on Monday

## 2021-09-30 ENCOUNTER — Ambulatory Visit: Payer: BC Managed Care – PPO | Admitting: Allergy

## 2021-09-30 ENCOUNTER — Other Ambulatory Visit: Payer: Self-pay

## 2021-09-30 ENCOUNTER — Ambulatory Visit: Payer: BC Managed Care – PPO | Admitting: Podiatry

## 2021-09-30 ENCOUNTER — Encounter: Payer: Self-pay | Admitting: Allergy

## 2021-09-30 VITALS — BP 118/80 | HR 83 | Temp 97.0°F | Resp 17 | Ht 64.0 in | Wt 175.0 lb

## 2021-09-30 DIAGNOSIS — S86112A Strain of other muscle(s) and tendon(s) of posterior muscle group at lower leg level, left leg, initial encounter: Secondary | ICD-10-CM | POA: Diagnosis not present

## 2021-09-30 DIAGNOSIS — M21862 Other specified acquired deformities of left lower leg: Secondary | ICD-10-CM | POA: Diagnosis not present

## 2021-09-30 DIAGNOSIS — R12 Heartburn: Secondary | ICD-10-CM | POA: Diagnosis not present

## 2021-09-30 DIAGNOSIS — L2089 Other atopic dermatitis: Secondary | ICD-10-CM

## 2021-09-30 DIAGNOSIS — J302 Other seasonal allergic rhinitis: Secondary | ICD-10-CM

## 2021-09-30 DIAGNOSIS — M7662 Achilles tendinitis, left leg: Secondary | ICD-10-CM

## 2021-09-30 DIAGNOSIS — Z889 Allergy status to unspecified drugs, medicaments and biological substances status: Secondary | ICD-10-CM | POA: Diagnosis not present

## 2021-09-30 DIAGNOSIS — J3089 Other allergic rhinitis: Secondary | ICD-10-CM

## 2021-09-30 DIAGNOSIS — J4540 Moderate persistent asthma, uncomplicated: Secondary | ICD-10-CM

## 2021-09-30 DIAGNOSIS — H1010 Acute atopic conjunctivitis, unspecified eye: Secondary | ICD-10-CM

## 2021-09-30 DIAGNOSIS — H1013 Acute atopic conjunctivitis, bilateral: Secondary | ICD-10-CM

## 2021-09-30 MED ORDER — SPIRIVA RESPIMAT 1.25 MCG/ACT IN AERS: 2.0000 | INHALATION_SPRAY | Freq: Every day | RESPIRATORY_TRACT | 5 refills | Status: DC

## 2021-09-30 MED ORDER — FLUTICASONE FUROATE-VILANTEROL 200-25 MCG/INH IN AEPB: 1.0000 | INHALATION_SPRAY | Freq: Every day | RESPIRATORY_TRACT | 5 refills | Status: DC

## 2021-09-30 MED ORDER — AZELASTINE HCL 0.1 % NA SOLN: 1.0000 | Freq: Two times a day (BID) | NASAL | 5 refills | Status: DC | PRN

## 2021-09-30 NOTE — Assessment & Plan Note (Signed)
Asymptomatic with no meds.  Monitor symptoms.

## 2021-09-30 NOTE — Assessment & Plan Note (Signed)
Past history - reactions to amoxicillin, cefuroxime, hydrocodone, levofloxacin, morphine, oxycodone, and propoxyphene in the past.   Continue to avoid all these medications.  

## 2021-09-30 NOTE — Progress Notes (Signed)
Follow Up Note  RE: Erin Collins MRN: [id number] DOB: 10-09-1976 Date of Office Visit: 09/30/2021  Referring provider: Maudie Mercury, MD Primary care provider: Maudie Mercury, MD  Chief Complaint: Asthma (Asthma has been flared up because the change in wheather) and Allergies (Flared because she's been outside more than usual)  History of Present Illness: I had the pleasure of seeing Erin Collins for a follow up visit at the [facility] [place] on 09/30/2021. She is a 45 y.o. female, who is being followed for asthma, allergic rhinoconjunctivitis, heartburn, multiple drug allergies and atopic dermatitis. Her previous allergy office visit was on 04/16/2021 with Dr. Maudie Mercury. Today is a regular follow up visit.  Asthma  She noticed her asthma flaring for the past 1 month with chest tightness, shortness of breath, PND. Currently using Breztri 1 puff twice a day and using albuterol twice a day for the past few weeks. Thinks that the Quinter worked better.   Denies any ER/urgent care visits or prednisone use since the last visit. Still taking Singulair 10mg  daily at night. Did not tolerate trelegy. Interested in starting biologics now.    Seasonal and perennial allergic rhinoconjunctivitis Patient had allergy flare with PND.  Currently taking Singulair 10mg  daily, zyrtec 10mg  daily, Flonase sensamist every other day. Using refresh eye drops.   Heartburn Asymptomatic with no meds.   Other atopic dermatitis Dry hands and using Eucerin.    Assessment and Plan: Erin Collins is a 45 y.o. female with: Not well controlled moderate persistent asthma Past history - Diagnosed with asthma over 19 years ago. Patient moved in with her parents who have cats and dogs at home during this time. Did not tolerate Trelegy. Flares in the spring and fall.  Interim history - Breo + Spiriva seemed to work better than [employer]. Symptoms flared the last 1 month requiring albuterol daily.  Today's spirometry  was normal - slight improvement from previous one.  Daily controller medication(s): stop Breztri. Start Breo 286mcg 1 puff once a day and rinse mouth after each use.  Start Spiriva 1.78mcg 2 puff once a day. Sample given. Continue Singulair 10mg  daily May use albuterol rescue inhaler 2 puffs or nebulizer every 4 to 6 hours as needed for shortness of breath, chest tightness, coughing, and wheezing. May use albuterol rescue inhaler 2 puffs 5 to 15 minutes prior to strenuous physical activities. Monitor frequency of use.  Start PA for Fasenra or Nucala - eos 300. Get spirometry at next visit.  Seasonal and perennial allergic rhinoconjunctivitis Past history - Perennial rhinoconjunctivitis symptoms for the past 20 years with worsening in the spring and fall.  Allergy immunotherapy for 10 years with good benefit. 2019 skin testing showed, positive to dust mites, cat, dog, grass and horse. 2 cats at home. 2022 bloodwork positive to dust mites, cat, dog, grass pollen. Borderline to cockroach, mouse and maple/box elder tree pollen.  Interim history - symptoms flared. Continue environmental control measures - especially regarding the cats. Continue Singulair (montelukast) 10mg  daily.  Use over the counter antihistamines such as Zyrtec (cetirizine), Claritin (loratadine), Allegra (fexofenadine), or Xyzal (levocetirizine) daily as needed. May take twice a day during allergy flares. May switch antihistamines every few months. May use Flonase sensamist 2 sprays per nostril once day for nasal congestion as needed.  Use azelastine nasal spray 1-2 sprays per nostril twice a day as needed for runny nose/drainage. Nasal saline spray (i.e., Simply Saline) or nasal saline lavage (i.e., NeilMed) is recommended as needed  and prior to medicated nasal sprays. May use refresh eye drops.  Need asthma to be more stable before starting AIT.  Heartburn Asymptomatic with no meds. Monitor symptoms.  Other atopic  dermatitis Past history - Eucrisa not effective.  Continue proper skin care measures.  May use triamcinolone 0.1% ointment twice a day as needed for the hands. Do not use on the face, neck, armpits or groin area. Do not use more than 3 weeks in a row.   Multiple drug allergies Past history - reactions to amoxicillin, cefuroxime, hydrocodone, levofloxacin, morphine, oxycodone, and propoxyphene in the past.  Continue to avoid all these medications.   Return in about 3 months (around 12/31/2021).  Meds ordered this encounter  Medications   azelastine (ASTELIN) 0.1 % nasal spray    Sig: Place 1-2 sprays into both nostrils 2 (two) times daily as needed (nasal drainage). Use in each nostril as directed    Dispense:  30 mL    Refill:  5   fluticasone furoate-vilanterol (BREO ELLIPTA) 200-25 MCG/INH AEPB    Sig: Inhale 1 puff into the lungs daily. Rinse mouth after each use.    Dispense:  60 each    Refill:  5   Tiotropium Bromide Monohydrate (SPIRIVA RESPIMAT) 1.25 MCG/ACT AERS    Sig: Inhale 2 puffs into the lungs daily.    Dispense:  4 g    Refill:  5    Lab Orders  No laboratory test(s) ordered today    Diagnostics: Spirometry:  Tracings reviewed. Her effort: Good reproducible efforts. FVC: 3.00L FEV1: 2.26L, 77% predicted FEV1/FVC ratio: 75% Interpretation: Spirometry consistent with normal pattern.  Please see scanned spirometry results for details.  Medication List:  Current Outpatient Medications  Medication Sig Dispense Refill   albuterol (PROVENTIL) (2.5 MG/3ML) 0.083% nebulizer solution Take 3 mLs (2.5 mg total) by nebulization every 4 (four) hours as needed for wheezing or shortness of breath (coughing fits). 75 mL 2   azelastine (ASTELIN) 0.1 % nasal spray Place 1-2 sprays into both nostrils 2 (two) times daily as needed (nasal drainage). Use in each nostril as directed 30 mL 5   fluticasone furoate-vilanterol (BREO ELLIPTA) 200-25 MCG/INH AEPB Inhale 1 puff into the  lungs daily. Rinse mouth after each use. 60 each 5   meloxicam (MOBIC) 15 MG tablet Take 1 tablet (15 mg total) by mouth daily. 30 tablet 3   montelukast (SINGULAIR) 10 MG tablet Take 1 tablet (10 mg total) by mouth at bedtime. 30 tablet 5   Spacer/Aero-Holding Chambers (EQ SPACE CHAMBER ANTI-STATIC) DEVI Inhale into the lungs as directed.     Tiotropium Bromide Monohydrate (SPIRIVA RESPIMAT) 1.25 MCG/ACT AERS Inhale 2 puffs into the lungs daily. 4 g 5   VENTOLIN HFA 108 (90 Base) MCG/ACT inhaler INHALE 1 TO 2 PUFFS BY MOUTH EVERY 6 HOURS AS NEEDED FOR WHEEZING OR SHORTNESS OF BREATH 8 g 0   No current facility-administered medications for this visit.   Allergies: Allergies  Allergen Reactions   Amoxicillin Hives   Cefuroxime Axetil Hives   Hydrocodone Itching   Levofloxacin Other (See Comments)    tendonitis   Morphine Itching   Oxycodone-Acetaminophen Itching   Penicillins Hives    Has patient had a PCN reaction causing immediate rash, facial/tongue/throat swelling, SOB or lightheadedness with hypotension: Yes Has patient had a PCN reaction causing severe rash involving mucus membranes or skin necrosis: No Has patient had a PCN reaction that required hospitalization: No Has patient had a PCN reaction  occurring within the last 10 years: No If all of the above answers are "NO", then may proceed with Cephalosporin use.    Propoxyphene Itching   I reviewed her past medical history, social history, family history, and environmental history and no significant changes have been reported from her previous visit.  Review of Systems  Constitutional:  Negative for appetite change, chills, fatigue, fever and unexpected weight change.  HENT:  Negative for congestion, postnasal drip and rhinorrhea.   Eyes:  Positive for itching.  Respiratory:  Positive for cough, chest tightness and wheezing. Negative for shortness of breath.   Cardiovascular:  Negative for chest pain.  Gastrointestinal:   Negative for abdominal pain.  Genitourinary:  Negative for difficulty urinating.  Skin:  Negative for rash.  Allergic/Immunologic: Positive for environmental allergies. Negative for food allergies.   Objective: BP 118/80   Pulse 83   Temp (!) 97 F (36.1 C) (Temporal)   Resp 17   Ht 5\' 4"  (1.626 m)   Wt 175 lb (79.4 kg)   SpO2 97%   BMI 30.04 kg/m  Body mass index is 30.04 kg/m. Physical Exam Vitals and nursing note reviewed.  Constitutional:      Appearance: Normal appearance. She is well-developed.  HENT:     Head: Normocephalic and atraumatic.     Right Ear: External ear normal.     Left Ear: External ear normal.     Nose: Nose normal.     Mouth/Throat:     Mouth: Mucous membranes are moist.     Pharynx: Oropharynx is clear.  Eyes:     Conjunctiva/sclera: Conjunctivae normal.  Cardiovascular:     Rate and Rhythm: Normal rate and regular rhythm.     Heart sounds: Normal heart sounds. No murmur heard. Pulmonary:     Effort: Pulmonary effort is normal.     Breath sounds: Normal breath sounds. No wheezing, rhonchi or rales.  Musculoskeletal:     Cervical back: Neck supple.  Skin:    General: Skin is warm.     Findings: No rash.  Neurological:     Mental Status: She is alert and oriented to person, place, and time.  Psychiatric:        Behavior: Behavior normal.   Previous notes and tests were reviewed. The plan was reviewed with the patient/family, and all questions/concerned were addressed.  It was my pleasure to see Erin Collins today and participate in her care. Please feel free to contact me with any questions or concerns.  Sincerely,  Rexene Alberts, DO Allergy & Immunology  [facility] office: [place] office: [phone]

## 2021-09-30 NOTE — Assessment & Plan Note (Signed)
Past history - Diagnosed with asthma over 19 years ago. Patient moved in with her parents who have cats and dogs at home during this time. Did not tolerate Trelegy. Flares in the spring and fall.  Interim history - Breo + Spiriva seemed to work better than [employer]. Symptoms flared the last 1 month requiring albuterol daily.   Today's spirometry was normal - slight improvement from previous one.   Daily controller medication(s): stop Breztri. ? Start Breo 221mcg 1 puff once a day and rinse mouth after each use.  ? Start Spiriva 1.66mcg 2 puff once a day. Sample given. ? Continue Singulair 10mg  daily  May use albuterol rescue inhaler 2 puffs or nebulizer every 4 to 6 hours as needed for shortness of breath, chest tightness, coughing, and wheezing. May use albuterol rescue inhaler 2 puffs 5 to 15 minutes prior to strenuous physical activities. Monitor frequency of use.   Start PA for Fasenra or Nucala - eos 300.  Get spirometry at next visit.

## 2021-09-30 NOTE — Patient Instructions (Addendum)
Moderate persistent asthma Erin Collins will be in contact with you regarding the asthma injection.  Daily controller medication(s): stop Breztri. Start Breo 283mcg 1 puff once a day and rinse mouth after each use.  Start Spiriva 1.55mcg 2 puff once a day.  Continue Singulair 10mg  daily May use albuterol rescue inhaler 2 puffs or nebulizer every 4 to 6 hours as needed for shortness of breath, chest tightness, coughing, and wheezing. May use albuterol rescue inhaler 2 puffs 5 to 15 minutes prior to strenuous physical activities. Monitor frequency of use.  Asthma control goals:  Full participation in all desired activities (may need albuterol before activity) Albuterol use two times or less a week on average (not counting use with activity) Cough interfering with sleep two times or less a month Oral steroids no more than once a year No hospitalizations   Seasonal and perennial allergic rhinoconjunctivitis 2022 bloodwork positive to dust mites, cat, dog, grass pollen. Borderline to cockroach, mouse and maple/box elder tree pollen.  Continue environmental control measures - especially regarding the cats. Continue Singulair (montelukast) 10mg  daily.  Use over the counter antihistamines such as Zyrtec (cetirizine), Claritin (loratadine), Allegra (fexofenadine), or Xyzal (levocetirizine) daily as needed. May take twice a day during allergy flares. May switch antihistamines every few months.  May use Flonase sensamist 2 sprays per nostril once day for nasal congestion as needed.  Use azelastine nasal spray 1-2 sprays per nostril twice a day as needed for runny nose/drainage. Nasal saline spray (i.e., Simply Saline) or nasal saline lavage (i.e., NeilMed) is recommended as needed and prior to medicated nasal sprays. May use refresh eye drops.    Other atopic dermatitis Continue proper skin care measures. May use triamcinolone 0.1% ointment twice a day as needed for the hands. Do not use on the face, neck,  armpits or groin area. Do not use more than 3 weeks in a row.    Multiple drug allergies Continue to avoid medications on your allergy list.    Heartburn Monitor symptoms.  Follow up in 3 months or sooner if needed.   Pet Allergen Avoidance: Contrary to popular opinion, there are no "hypoallergenic" breeds of dogs or cats. That is because people are not allergic to an animal's hair, but to an allergen found in the animal's saliva, dander (dead skin flakes) or urine. Pet allergy symptoms typically occur within minutes. For some people, symptoms can build up and become most severe 8 to 12 hours after contact with the animal. People with severe allergies can experience reactions in public places if dander has been transported on the pet owners' clothing. Keeping an animal outdoors is only a partial solution, since homes with pets in the yard still have higher concentrations of animal allergens. Before getting a pet, ask your allergist to determine if you are allergic to animals. If your pet is already considered part of your family, try to minimize contact and keep the pet out of the bedroom and other rooms where you spend a great deal of time. As with dust mites, vacuum carpets often or replace carpet with a hardwood floor, tile or linoleum. High-efficiency particulate air (HEPA) cleaners can reduce allergen levels over time. While dander and saliva are the source of cat and dog allergens, urine is the source of allergens from rabbits, hamsters, mice and [place] pigs; so ask a non-allergic family member to clean the animal's cage. If you have a pet allergy, talk to your allergist about the potential for allergy immunotherapy (allergy shots). This  strategy can often provide long-term relief.

## 2021-09-30 NOTE — Assessment & Plan Note (Signed)
Past history - Eucrisa not effective.  Continue proper skin care measures.  May use triamcinolone 0.1% ointment twice a day as needed for the hands. Do not use on the face, neck, armpits or groin area. Do not use more than 3 weeks in a row.  

## 2021-09-30 NOTE — Assessment & Plan Note (Signed)
Past history - Perennial rhinoconjunctivitis symptoms for the past 20 years with worsening in the spring and fall.  Allergy immunotherapy for 10 years with good benefit. 2019 skin testing showed, positive to dust mites, cat, dog, grass and horse. 2 cats at home. 2022 bloodwork positive to dust mites, cat, dog, grass pollen. Borderline to cockroach, mouse and maple/box elder tree pollen.  Interim history - symptoms flared.  Continue environmental control measures - especially regarding the cats.  Continue Singulair (montelukast) 10mg  daily.   Use over the counter antihistamines such as Zyrtec (cetirizine), Claritin (loratadine), Allegra (fexofenadine), or Xyzal (levocetirizine) daily as needed. May take twice a day during allergy flares. May switch antihistamines every few months.  May use Flonase sensamist 2 sprays per nostril once day for nasal congestion as needed.   Use azelastine nasal spray 1-2 sprays per nostril twice a day as needed for runny nose/drainage.  Nasal saline spray (i.e., Simply Saline) or nasal saline lavage (i.e., NeilMed) is recommended as needed and prior to medicated nasal sprays.  May use refresh eye drops.   Need asthma to be more stable before starting AIT.

## 2021-09-30 NOTE — Progress Notes (Signed)
Subjective:  Patient ID: Erin Collins, female    DOB: 1976/04/03,  MRN: [id number]  Chief Complaint  Patient presents with   Tendonitis     Follow up painful L foot; wants to talk about getting surgery     45 y.o. female returns for follow-up with the above complaint.  Continues to be quite painful but the cramping has resolved some, she has more pain into the knee that was worse with using the TENS unit  Objective:  Physical Exam: warm, good capillary refill, no trophic changes or ulcerative lesions, normal DP and PT pulses and normal sensory exam. Left Foot: Sharp pain on palpation of the distal Achilles tendon at the insertion and in the mid substance  Radiographs: X-ray of the left foot: Small plantar and posterior calcaneal enthesophytes  Study Result  Narrative & Impression  CLINICAL DATA:  Achilles tendinitis   EXAM: MR OF THE LEFT HEEL WITHOUT CONTRAST   TECHNIQUE: Multiplanar, multisequence MR imaging of the left heel was performed. No intravenous contrast was administered.   COMPARISON:  Left foot radiograph 04/20/2021   FINDINGS: TENDONS   Peroneal: Peroneal longus and brevis tendons are intact.   Posteromedial: Posterior tibial tendon intact. Flexor digitorum longus tendon intact. Flexor hallucis longus tendon intact.   Anterior: Tibialis anterior tendon intact. Extensor hallucis longus tendon intact Extensor digitorum longus tendon intact.   Achilles: Achilles tendon is intact. There is mild insertional Achilles thickening and Achilles enthesophyte formation.   Plantar Fascia: Intact.   LIGAMENTS   Lateral: Anterior talofibular ligament intact. Calcaneofibular ligament intact. Posterior talofibular ligament intact. Anterior and posterior tibiofibular ligaments intact.   Medial: Deltoid ligament intact. Spring ligament intact.   CARTILAGE   Ankle Joint: No significant joint effusion. Normal ankle mortise. No chondral defect.   Subtalar  Joints/Sinus Tarsi: Normal subtalar joints. No subtalar joint effusion. Normal sinus tarsi.   Bones: No marrow signal abnormality.  No fracture or dislocation.   Soft Tissue: No fluid collection or hematoma. Muscles are normal without edema or atrophy. Tarsal tunnel is normal.   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   Intact ankle ligaments.  No acute tendon tear.     Electronically Signed   By: Maurine Simmering   On: 07/20/2021 15:15   Assessment:   1. Tendonitis, Achilles, left   2. Gastrocnemius equinus of left lower extremity   3. Strain of gastrocnemius muscle of left lower extremity, initial encounter        Plan:  Patient was evaluated and treated and all questions answered.  At this point she has not improved with nonsurgical treatment we discussed further treatment options.  She has new pain along the medial head of the gastroc and I suspect she has a strain possibly partial tear of this as well.  We discussed getting an MRI to evaluate this but either way would simply require rest.  I think we should proceed with Topaz and PRP injection of the Achilles tendon for her chronic inflammation.  Discussed that this may not be a definitive procedure but hopefully will reduce inflammation enough and allow her to heal that therapy and other modalities can continue to improve her symptoms.  Also discussed the eventuality of open Achilles repair if this does not work.  We discussed the risk benefits and potential complications including but not limited to pain, swelling, infection, scar, numbness which may be temporary or permanent, chronic pain, stiffness, nerve pain or damage, wound healing problems.  She  understands and wishes to proceed.  Informed consent was signed and reviewed.  Surgery be scheduled for November 11 after she returns from her trip.  Gastrocnemius muscle I recommend she rest is much as possible avoid exercise on this no more TENS  unit on the gastroc.  Hopeful this resolves with rest now and after surgery.  If not improving will get MRI   Surgical plan:  Procedure: -Left lower extremity Topaz procedure for Achilles and platelet rich plasma injection  Location: -[facility]  Anesthesia plan: -IV sedation with regional block  Postoperative pain plan: - Tylenol 1000 mg every 6 hours, ibuprofen 600 mg every 6 hours, gabapentin 300 mg every 8 hours x5 days, oxycodone 5 mg 1-2 tabs every 6 hours only as needed  DVT prophylaxis: -None required  WB Restrictions / DME needs: -WBAT in CAM boot    No follow-ups on file.

## 2021-10-04 ENCOUNTER — Telehealth: Payer: Self-pay | Admitting: *Deleted

## 2021-10-04 NOTE — Telephone Encounter (Signed)
-----   Message from Garnet Sierras, DO sent at 09/30/2021  4:07 PM EDT ----- Please start PA for Fasenra for asthma, if not covered then Nucala. September 2022 eos were 300. Thank you.

## 2021-10-04 NOTE — Telephone Encounter (Signed)
Left message for patient to contact me to advise approval, copay card and submit

## 2021-10-06 ENCOUNTER — Ambulatory Visit: Payer: BC Managed Care – PPO | Admitting: Behavioral Health

## 2021-10-08 ENCOUNTER — Telehealth: Payer: Self-pay | Admitting: Urology

## 2021-10-08 NOTE — Telephone Encounter (Signed)
DOS - 10/29/21  REPAIR ACHILLES TENDON LEFT --- 11155   [employer]BS EFFECTIVE DATE - 05/19/21   PLAN DEDUCTIBLE  - $2,500.00 W/ $1,630.56 REMAINING OUT OF POCKET - $6,000.00 W/ $2,080.22 REMAINING COINSURANCE - 20%  COPAY - $0.00    NO PRIOR AUTH IS REQUIRED

## 2021-10-09 ENCOUNTER — Other Ambulatory Visit: Payer: Self-pay | Admitting: Allergy

## 2021-10-09 ENCOUNTER — Other Ambulatory Visit: Payer: Self-pay | Admitting: Podiatry

## 2021-10-21 ENCOUNTER — Ambulatory Visit: Payer: BC Managed Care – PPO | Admitting: Behavioral Health

## 2021-10-21 DIAGNOSIS — F419 Anxiety disorder, unspecified: Secondary | ICD-10-CM

## 2021-10-21 DIAGNOSIS — F330 Major depressive disorder, recurrent, mild: Secondary | ICD-10-CM

## 2021-10-21 NOTE — BH Specialist Note (Signed)
Integrated Behavioral Health via Telemedicine Visit  10/21/2021 Erin Collins [id number]  Number of [place] visits: 2/6 Session Start time: 10:00am  Session End time: 10:45am Total time: 45   Referring Provider: Dr. Maudie Mercury, MD Patient/Family location: Pt is in her car w/her Best Friend from [facility] Provider location: [facility] Office All persons participating in visit: Pt & Clinician Types of Service: Family psychotherapy  I connected with Erin Collins and/or Erin Collins's  self  via  Telephone or Video Enabled Telemedicine Application  (Video is Caregility application) and verified that I am speaking with the correct person using two identifiers. Discussed confidentiality:  2nd visit  I discussed the limitations of telemedicine and the availability of in person appointments.  Discussed there is a possibility of technology failure and discussed alternative modes of communication if that failure occurs.  I discussed that engaging in this telemedicine visit, they consent to the provision of behavioral healthcare and the services will be billed under their insurance.  Patient and/or legal guardian expressed understanding and consented to Telemedicine visit:  2nd visit  Presenting Concerns: Patient and/or family reports the following symptoms/concerns: reduction in anx/dep; work stressor w/the Budget season, improvement in sleep Duration of problem: months; Severity of problem: mild  Patient and/or Family's Strengths/Protective Factors: Social connections, Social and Patent attorney, Concrete supports in place (healthy food, safe environments, etc.), Sense of purpose, Physical Health (exercise, healthy diet, medication compliance, etc.), and Healthy self-care practices  Goals Addressed: Patient will:  Reduce symptoms of: anxiety and stress   Increase knowledge and/or ability of: stress reduction   Demonstrate ability to: Increase healthy  adjustment to current life circumstances  Progress towards Goals: Ongoing  Interventions: Interventions utilized:  Mindfulness or Relaxation Training Standardized Assessments completed:  screeners prn  Patient and/or Family Response: Pt receptive to call today & requests future appt after week of Thxgiving. She is having surgery on Nov 11th on her achilles tendon & has to take bedrest for 2 wks.  Assessment: Patient currently experiencing reduction in anx & improved sleep hygiene. Pt is in her car w/Best Erin Collins today riding to [place] for [employer] to [employer].  Pt & Friend have disconnected from all technology to enjoy the trip. Pt is only avail by [employer].  Patient may benefit from 1-2 more sessions to secure sleep hygiene & monitor anx. Pt is using calm.com & likes it. She is exploring other anti-anxiety applications.   Plan: Follow up with behavioral health clinician on : The week after Thxgiving for 30 min ck-in on telehealth Behavioral recommendations: Cont to explore apps, take Budget Season in stride-it will end. Enjoy your trip! Referral(s): [facility] (In Clinic)  I discussed the assessment and treatment plan with the patient and/or parent/guardian. They were provided an opportunity to ask questions and all were answered. They agreed with the plan and demonstrated an understanding of the instructions.   They were advised to call back or seek an in-person evaluation if the symptoms worsen or if the condition fails to improve as anticipated.  Erin Hutching, LMFT

## 2021-10-25 NOTE — Telephone Encounter (Signed)
L/m for patient again to contact me ?

## 2021-10-28 ENCOUNTER — Encounter: Payer: Self-pay | Admitting: *Deleted

## 2021-10-28 ENCOUNTER — Ambulatory Visit: Payer: BC Managed Care – PPO | Admitting: Podiatry

## 2021-10-28 NOTE — Telephone Encounter (Signed)
Sent Mychart message to contact me

## 2021-10-29 ENCOUNTER — Other Ambulatory Visit: Payer: Self-pay | Admitting: Podiatry

## 2021-10-29 DIAGNOSIS — M7662 Achilles tendinitis, left leg: Secondary | ICD-10-CM | POA: Diagnosis not present

## 2021-10-29 DIAGNOSIS — M25572 Pain in left ankle and joints of left foot: Secondary | ICD-10-CM | POA: Diagnosis not present

## 2021-10-29 MED ORDER — TRAMADOL HCL 50 MG PO TABS: 50.0000 mg | ORAL_TABLET | Freq: Three times a day (TID) | ORAL | 0 refills | Status: AC | PRN

## 2021-10-29 MED ORDER — ACETAMINOPHEN 500 MG PO TABS: 1000.0000 mg | ORAL_TABLET | Freq: Four times a day (QID) | ORAL | 0 refills | Status: AC | PRN

## 2021-10-29 NOTE — Progress Notes (Unsigned)
11/11 [facility] topaz and PRP injection

## 2021-11-03 NOTE — Telephone Encounter (Signed)
Patient never responded to messages. If patient returns or calls in future and want to start therapy she can reach out to me

## 2021-11-04 ENCOUNTER — Other Ambulatory Visit: Payer: Self-pay

## 2021-11-04 ENCOUNTER — Ambulatory Visit (INDEPENDENT_AMBULATORY_CARE_PROVIDER_SITE_OTHER): Payer: BC Managed Care – PPO | Admitting: Podiatry

## 2021-11-04 DIAGNOSIS — M21862 Other specified acquired deformities of left lower leg: Secondary | ICD-10-CM

## 2021-11-04 DIAGNOSIS — M7662 Achilles tendinitis, left leg: Secondary | ICD-10-CM

## 2021-11-08 ENCOUNTER — Ambulatory Visit: Payer: BC Managed Care – PPO | Admitting: Allergy

## 2021-11-08 NOTE — Progress Notes (Signed)
  Subjective:  Patient ID: Erin Collins, female    DOB: 1976/12/08,  MRN: [id number]  Chief Complaint  Patient presents with   Routine Post Op    POV #1 DOS 10/29/2021 ACHILLES REPAIR LT W/TOPAZ, PRP INJECTION      45 y.o. female returns for post-op check.  Overall doing okay, still sore in the back of the heel but improved since surgery  Review of Systems: Negative except as noted in the HPI. Denies N/V/F/Ch.   Objective:  There were no vitals filed for this visit. There is no height or weight on file to calculate BMI. Constitutional Well developed. Well nourished.  Vascular Foot warm and well perfused. Capillary refill normal to all digits.   Neurologic Normal speech. Oriented to person, place, and time. Epicritic sensation to light touch grossly present bilaterally.  Dermatologic Topaz penetration sites are well-healed  Orthopedic: Tenderness to palpation noted about the surgical site.    Assessment:   1. Tendonitis, Achilles, left   2. Gastrocnemius equinus of left lower extremity    Plan:  Patient was evaluated and treated and all questions answered.  S/p foot surgery left -Progressing as expected post-operatively. -WB Status: WBAT in CAM boot can transition to shoe gear as tolerated over the next few weeks -Resume physical therapy after Thanksgiving  Return in about 5 weeks (around 12/09/2021) for POV re-check Achilles tendon.

## 2021-11-16 ENCOUNTER — Ambulatory Visit: Payer: BC Managed Care – PPO | Admitting: Behavioral Health

## 2021-11-16 ENCOUNTER — Telehealth: Payer: Self-pay | Admitting: Behavioral Health

## 2021-11-16 NOTE — Telephone Encounter (Signed)
Lft 2 msgs for Pt today re: [facility] telehealth session. Requested Pt RC to [facility] & r/s @ her convenience.  Dr. Theodis Shove

## 2021-11-18 ENCOUNTER — Encounter: Payer: BC Managed Care – PPO | Admitting: Podiatry

## 2021-11-19 DIAGNOSIS — M79672 Pain in left foot: Secondary | ICD-10-CM | POA: Diagnosis not present

## 2021-11-19 DIAGNOSIS — R531 Weakness: Secondary | ICD-10-CM | POA: Diagnosis not present

## 2021-11-19 DIAGNOSIS — M7662 Achilles tendinitis, left leg: Secondary | ICD-10-CM | POA: Diagnosis not present

## 2021-11-19 DIAGNOSIS — R2689 Other abnormalities of gait and mobility: Secondary | ICD-10-CM | POA: Diagnosis not present

## 2021-11-26 DIAGNOSIS — R531 Weakness: Secondary | ICD-10-CM | POA: Diagnosis not present

## 2021-11-26 DIAGNOSIS — R2689 Other abnormalities of gait and mobility: Secondary | ICD-10-CM | POA: Diagnosis not present

## 2021-11-26 DIAGNOSIS — M79672 Pain in left foot: Secondary | ICD-10-CM | POA: Diagnosis not present

## 2021-11-26 DIAGNOSIS — M7662 Achilles tendinitis, left leg: Secondary | ICD-10-CM | POA: Diagnosis not present

## 2021-12-09 ENCOUNTER — Ambulatory Visit (INDEPENDENT_AMBULATORY_CARE_PROVIDER_SITE_OTHER): Payer: BC Managed Care – PPO | Admitting: Podiatry

## 2021-12-09 ENCOUNTER — Other Ambulatory Visit: Payer: Self-pay

## 2021-12-09 DIAGNOSIS — M21862 Other specified acquired deformities of left lower leg: Secondary | ICD-10-CM

## 2021-12-09 DIAGNOSIS — M7662 Achilles tendinitis, left leg: Secondary | ICD-10-CM

## 2021-12-09 DIAGNOSIS — R531 Weakness: Secondary | ICD-10-CM | POA: Diagnosis not present

## 2021-12-09 DIAGNOSIS — M79672 Pain in left foot: Secondary | ICD-10-CM | POA: Diagnosis not present

## 2021-12-09 DIAGNOSIS — R2689 Other abnormalities of gait and mobility: Secondary | ICD-10-CM | POA: Diagnosis not present

## 2021-12-10 NOTE — Progress Notes (Signed)
°  Subjective:  Patient ID: Erin Collins, female    DOB: May 26, 1976,  MRN: [id number]  Chief Complaint  Patient presents with   Routine Post Op       POV #3 DOS 10/29/2021 ACHILLES REPAIR LT W/TOPAZ, PRP INJECTION      45 y.o. female returns for post-op check.  Still having pain, she is frustrated with her progress and feels like she should be better at this point.  She is been doing therapy which is helped.  Says her pain is probably a 5 or 6 out of 10 compared to a 10 out of 10 prior to surgery.  Review of Systems: Negative except as noted in the HPI. Denies N/V/F/Ch.   Objective:  There were no vitals filed for this visit. There is no height or weight on file to calculate BMI. Constitutional Well developed. Well nourished.  Vascular Foot warm and well perfused. Capillary refill normal to all digits.   Neurologic Normal speech. Oriented to person, place, and time. Epicritic sensation to light touch grossly present bilaterally.  Dermatologic Topaz penetration sites are well-healed  Orthopedic: Tenderness to palpation at the insertion of the Achilles on the posterior heel    Assessment:   1. Tendonitis, Achilles, left   2. Gastrocnemius equinus of left lower extremity    Plan:  Patient was evaluated and treated and all questions answered.  S/p foot surgery left -She is frustrated with her progress and feels like she should be improving faster.  Discussed with his does take quite some time.  She has had pain improvement since before the procedure.  I think she should continue physical therapy for now we discussed exercise options such as swimming that are not impactful.  Discussed the possibility of failure of the procedure as well and what indicators we would need to proceed to open surgery.  I likely would want another MRI prior to this before we would make this decision.  She can resume taking NSAIDs if needed for pain at this point  No follow-ups on file.

## 2021-12-19 HISTORY — PX: ANKLE SURGERY: SHX546

## 2021-12-19 NOTE — Progress Notes (Signed)
Follow Up Note  RE: Erin Collins MRN: [id number] DOB: 22-Mar-1976 Date of Office Visit: 12/20/2021  Referring provider: Maudie Mercury, MD Primary care provider: Maudie Mercury, MD  Chief Complaint: Asthma (Been okay; declining injections for now - planning to get back into the gym ) and Seasonal and perennial allergic rhinoconjunctivitis (Going good)  History of Present Illness: I had the pleasure of seeing Erin Collins for a follow up visit at the [facility] on 12/20/2021. She is a 46 y.o. female, who is being followed for asthma, allergic rhinoconjunctivitis, heartburn, atopic dermatitis and multiple drug allergies. Her previous allergy office visit was on 09/30/2021 with Dr. Maudie Mercury. Today is a regular follow up visit.  Asthma Currently on Spiriva 1.83mcg 2 puffs once a day and Breo 254mcg 1 puff once a day. She likes this combination better than the [facility]. Using albuterol twice a day due to chest tightness and sometimes may be due to exertion.   Still taking Singulair 10mg  daily.  Patient is planning on losing weight but has been difficult due to her recent foot surgeries.  She is hoping to get pregnant with her new significant other and because of this she is not sure about starting biologics for asthma.  Not sure if she wants to start on biologics yet.   Seasonal and perennial allergic rhinoconjunctivitis Taking zyrtec and Singulair daily with good benefit. Using refresh eye drops as needed. No nasal spray use due to dry nasal passages.   Heartburn Asymptomatic with no meds.   Other atopic dermatitis Stable.   Assessment and Plan: Kitti is a 46 y.o. female with: Not well controlled moderate persistent asthma Past history - Diagnosed with asthma over 19 years ago. Patient moved in with her parents who have cats and dogs at home during this time. Did not tolerate Trelegy, Breztri no as effective. Flares in the spring and fall.  Interim history - Breo +  Spiriva seems to work the best but still requiring daily albuterol use. Not sure about starting biologics as potentially wants to get pregnant next year.  Today's spirometry showed some restriction. Gave handout on Xolair - which has the most data in pregnancy and okay to continue during pregnancy. IgE on 04/16/2021 was 522. Xolair 375mg  every 2 weeks. Daily controller medication(s):  Continue Breo 25mcg 1 puff once a day and rinse mouth after each use.  Continue Spiriva 1.45mcg 2 puff once a day.  Continue Singulair 10mg  daily May use albuterol rescue inhaler 2 puffs or nebulizer every 4 to 6 hours as needed for shortness of breath, chest tightness, coughing, and wheezing. May use albuterol rescue inhaler 2 puffs 5 to 15 minutes prior to strenuous physical activities. Monitor frequency of use.  Get spirometry at next visit.   Seasonal and perennial allergic rhinoconjunctivitis Past history - Perennial rhinoconjunctivitis symptoms for the past 20 years with worsening in the spring and fall.  Allergy immunotherapy for 10 years with good benefit. 2019 skin testing showed, positive to dust mites, cat, dog, grass and horse. 2 cats at home. 2022 bloodwork positive to dust mites, cat, dog, grass pollen. Borderline to cockroach, mouse and maple/box elder tree pollen.  Interim history - stable.  Continue environmental control measures - especially regarding the cats. Continue Singulair (montelukast) 10mg  daily.  Use over the counter antihistamines such as Zyrtec (cetirizine), Claritin (loratadine), Allegra (fexofenadine), or Xyzal (levocetirizine) daily as needed. May take twice a day during allergy flares. May switch antihistamines every few  months. May use Flonase sensamist 2 sprays per nostril once day for nasal congestion as needed.  Use azelastine nasal spray 1-2 sprays per nostril twice a day as needed for runny nose/drainage. Nasal saline spray (i.e., Simply Saline) or nasal saline lavage (i.e.,  NeilMed) is recommended as needed and prior to medicated nasal sprays. May use refresh eye drops.   Need asthma to be more stable before starting AIT.  Other atopic dermatitis Past history - Eucrisa not effective.  Continue proper skin care measures.  May use triamcinolone 0.1% ointment twice a day as needed for the hands. Do not use on the face, neck, armpits or groin area. Do not use more than 3 weeks in a row.   Multiple drug allergies Past history - reactions to amoxicillin, cefuroxime, hydrocodone, levofloxacin, morphine, oxycodone, and propoxyphene in the past.  Continue to avoid all these medications.   Return in about 3 months (around 03/20/2022).  Meds ordered this encounter  Medications   montelukast (SINGULAIR) 10 MG tablet    Sig: Take 1 tablet (10 mg total) by mouth at bedtime.    Dispense:  30 tablet    Refill:  5   albuterol (VENTOLIN HFA) 108 (90 Base) MCG/ACT inhaler    Sig: Inhale 2 puffs into the lungs every 4 (four) hours as needed for wheezing or shortness of breath (coughing fits).    Dispense:  18 g    Refill:  1   Lab Orders  No laboratory test(s) ordered today    Diagnostics: Spirometry:  Tracings reviewed. Her effort: Good reproducible efforts. FVC: 2.64L FEV1: 2.17L, 76% predicted FEV1/FVC ratio: 82% Interpretation: Spirometry consistent with some mild restriction.  Please see scanned spirometry results for details.  Medication List:  Current Outpatient Medications  Medication Sig Dispense Refill   albuterol (PROVENTIL) (2.5 MG/3ML) 0.083% nebulizer solution Take 3 mLs (2.5 mg total) by nebulization every 4 (four) hours as needed for wheezing or shortness of breath (coughing fits). 75 mL 2   albuterol (VENTOLIN HFA) 108 (90 Base) MCG/ACT inhaler Inhale 2 puffs into the lungs every 4 (four) hours as needed for wheezing or shortness of breath (coughing fits). 18 g 1   azelastine (ASTELIN) 0.1 % nasal spray Place 1-2 sprays into both nostrils 2 (two)  times daily as needed (nasal drainage). Use in each nostril as directed 30 mL 5   fluticasone furoate-vilanterol (BREO ELLIPTA) 200-25 MCG/INH AEPB Inhale 1 puff into the lungs daily. Rinse mouth after each use. 60 each 5   meloxicam (MOBIC) 15 MG tablet Take 1 tablet (15 mg total) by mouth daily. 30 tablet 3   Spacer/Aero-Holding Chambers (EQ SPACE CHAMBER ANTI-STATIC) DEVI Inhale into the lungs as directed.     Tiotropium Bromide Monohydrate (SPIRIVA RESPIMAT) 1.25 MCG/ACT AERS Inhale 2 puffs into the lungs daily. 4 g 5   montelukast (SINGULAIR) 10 MG tablet Take 1 tablet (10 mg total) by mouth at bedtime. 30 tablet 5   No current facility-administered medications for this visit.   Allergies: Allergies  Allergen Reactions   Amoxicillin Hives   Cefuroxime Axetil Hives   Hydrocodone Itching   Levofloxacin Other (See Comments)    tendonitis   Morphine Itching   Oxycodone-Acetaminophen Itching   Penicillins Hives    Has patient had a PCN reaction causing immediate rash, facial/tongue/throat swelling, SOB or lightheadedness with hypotension: Yes Has patient had a PCN reaction causing severe rash involving mucus membranes or skin necrosis: No Has patient had a PCN reaction that  required hospitalization: No Has patient had a PCN reaction occurring within the last 10 years: No If all of the above answers are "NO", then may proceed with Cephalosporin use.    Propoxyphene Itching   I reviewed her past medical history, social history, family history, and environmental history and no significant changes have been reported from her previous visit.  Review of Systems  Constitutional:  Negative for appetite change, chills, fatigue, fever and unexpected weight change.  HENT:  Negative for congestion, postnasal drip and rhinorrhea.   Eyes:  Positive for itching.  Respiratory:  Positive for chest tightness. Negative for cough, shortness of breath and wheezing.   Cardiovascular:  Negative for chest  pain.  Gastrointestinal:  Negative for abdominal pain.  Genitourinary:  Negative for difficulty urinating.  Skin:  Negative for rash.  Allergic/Immunologic: Positive for environmental allergies. Negative for food allergies.   Objective: BP 130/84    Pulse 96    Temp 97.8 F (36.6 C)    Resp 18    Ht 5\' 4"  (1.626 m)    Wt 195 lb 9.6 oz (88.7 kg)    SpO2 99%    BMI 33.57 kg/m  Body mass index is 33.57 kg/m. Physical Exam Vitals and nursing note reviewed.  Constitutional:      Appearance: Normal appearance. She is well-developed.  HENT:     Head: Normocephalic and atraumatic.     Right Ear: Tympanic membrane and external ear normal.     Left Ear: Tympanic membrane and external ear normal.     Nose: Nose normal.     Mouth/Throat:     Mouth: Mucous membranes are moist.     Pharynx: Oropharynx is clear.  Eyes:     Conjunctiva/sclera: Conjunctivae normal.  Cardiovascular:     Rate and Rhythm: Normal rate and regular rhythm.     Heart sounds: Normal heart sounds. No murmur heard. Pulmonary:     Effort: Pulmonary effort is normal.     Breath sounds: Normal breath sounds. No wheezing, rhonchi or rales.  Musculoskeletal:     Cervical back: Neck supple.  Skin:    General: Skin is warm.     Findings: No rash.  Neurological:     Mental Status: She is alert and oriented to person, place, and time.  Psychiatric:        Behavior: Behavior normal.   Previous notes and tests were reviewed. The plan was reviewed with the patient/family, and all questions/concerned were addressed.  It was my pleasure to see Anaelle today and participate in her care. Please feel free to contact me with any questions or concerns.  Sincerely,  Rexene Alberts, DO Allergy & Immunology  [facility] office: [place] office: [phone]

## 2021-12-20 ENCOUNTER — Other Ambulatory Visit: Payer: Self-pay | Admitting: Allergy

## 2021-12-20 ENCOUNTER — Other Ambulatory Visit: Payer: Self-pay

## 2021-12-20 ENCOUNTER — Encounter: Payer: Self-pay | Admitting: Allergy

## 2021-12-20 ENCOUNTER — Ambulatory Visit (INDEPENDENT_AMBULATORY_CARE_PROVIDER_SITE_OTHER): Payer: BC Managed Care – PPO | Admitting: Allergy

## 2021-12-20 VITALS — BP 130/84 | HR 96 | Temp 97.8°F | Resp 18 | Ht 64.0 in | Wt 195.6 lb

## 2021-12-20 DIAGNOSIS — Z889 Allergy status to unspecified drugs, medicaments and biological substances status: Secondary | ICD-10-CM | POA: Diagnosis not present

## 2021-12-20 DIAGNOSIS — L2089 Other atopic dermatitis: Secondary | ICD-10-CM | POA: Diagnosis not present

## 2021-12-20 DIAGNOSIS — H1013 Acute atopic conjunctivitis, bilateral: Secondary | ICD-10-CM

## 2021-12-20 DIAGNOSIS — J302 Other seasonal allergic rhinitis: Secondary | ICD-10-CM | POA: Diagnosis not present

## 2021-12-20 DIAGNOSIS — H1010 Acute atopic conjunctivitis, unspecified eye: Secondary | ICD-10-CM

## 2021-12-20 DIAGNOSIS — J4540 Moderate persistent asthma, uncomplicated: Secondary | ICD-10-CM

## 2021-12-20 DIAGNOSIS — R12 Heartburn: Secondary | ICD-10-CM

## 2021-12-20 MED ORDER — MONTELUKAST SODIUM 10 MG PO TABS: 10.0000 mg | ORAL_TABLET | Freq: Every day | ORAL | 5 refills | Status: DC

## 2021-12-20 MED ORDER — ALBUTEROL SULFATE HFA 108 (90 BASE) MCG/ACT IN AERS: 2.0000 | INHALATION_SPRAY | RESPIRATORY_TRACT | 1 refills | Status: DC | PRN

## 2021-12-20 NOTE — Patient Instructions (Addendum)
Moderate persistent asthma Read about Xolair injections.  Let us know when ready to start.   Daily controller medication(s):  Continue Breo 27mcg 1 puff once a day and rinse mouth after each use.  Continue Spiriva 1.42mcg 2 puff once a day.  Continue Singulair 10mg  daily May use albuterol rescue inhaler 2 puffs or nebulizer every 4 to 6 hours as needed for shortness of breath, chest tightness, coughing, and wheezing. May use albuterol rescue inhaler 2 puffs 5 to 15 minutes prior to strenuous physical activities. Monitor frequency of use.  Asthma control goals:  Full participation in all desired activities (may need albuterol before activity) Albuterol use two times or less a week on average (not counting use with activity) Cough interfering with sleep two times or less a month Oral steroids no more than once a year No hospitalizations   Seasonal and perennial allergic rhinoconjunctivitis 2022 bloodwork positive to dust mites, cat, dog, grass pollen. Borderline to cockroach, mouse and maple/box elder tree pollen.  Continue environmental control measures - especially regarding the cats. Continue Singulair (montelukast) 10mg  daily.  Use over the counter antihistamines such as Zyrtec (cetirizine), Claritin (loratadine), Allegra (fexofenadine), or Xyzal (levocetirizine) daily as needed. May take twice a day during allergy flares. May switch antihistamines every few months.  May use Flonase sensamist 2 sprays per nostril once day for nasal congestion as needed.  Use azelastine nasal spray 1-2 sprays per nostril twice a day as needed for runny nose/drainage. Nasal saline spray (i.e., Simply Saline) or nasal saline lavage (i.e., NeilMed) is recommended as needed and prior to medicated nasal sprays. May use refresh eye drops.    Other atopic dermatitis Continue proper skin care measures. May use triamcinolone 0.1% ointment twice a day as needed for the hands. Do not use on the face, neck,  armpits or groin area. Do not use more than 3 weeks in a row.    Multiple drug allergies Continue to avoid medications on your allergy list.    Heartburn Monitor symptoms.  Follow up in 3 months or sooner if needed.

## 2021-12-20 NOTE — Assessment & Plan Note (Signed)
Past history - reactions to amoxicillin, cefuroxime, hydrocodone, levofloxacin, morphine, oxycodone, and propoxyphene in the past.   Continue to avoid all these medications.  

## 2021-12-20 NOTE — Assessment & Plan Note (Signed)
Past history - Eucrisa not effective.  Continue proper skin care measures.  May use triamcinolone 0.1% ointment twice a day as needed for the hands. Do not use on the face, neck, armpits or groin area. Do not use more than 3 weeks in a row.  

## 2021-12-20 NOTE — Assessment & Plan Note (Signed)
Past history - Perennial rhinoconjunctivitis symptoms for the past 20 years with worsening in the spring and fall.  Allergy immunotherapy for 10 years with good benefit. 2019 skin testing showed, positive to dust mites, cat, dog, grass and horse. 2 cats at home. 2022 bloodwork positive to dust mites, cat, dog, grass pollen. Borderline to cockroach, mouse and maple/box elder tree pollen.  Interim history - stable.   Continue environmental control measures - especially regarding the cats.  Continue Singulair (montelukast) 10mg  daily.   Use over the counter antihistamines such as Zyrtec (cetirizine), Claritin (loratadine), Allegra (fexofenadine), or Xyzal (levocetirizine) daily as needed. May take twice a day during allergy flares. May switch antihistamines every few months.  May use Flonase sensamist 2 sprays per nostril once day for nasal congestion as needed.   Use azelastine nasal spray 1-2 sprays per nostril twice a day as needed for runny nose/drainage.  Nasal saline spray (i.e., Simply Saline) or nasal saline lavage (i.e., NeilMed) is recommended as needed and prior to medicated nasal sprays.  May use refresh eye drops.    Need asthma to be more stable before starting AIT.

## 2021-12-20 NOTE — Assessment & Plan Note (Signed)
Past history - Diagnosed with asthma over 19 years ago. Patient moved in with her parents who have cats and dogs at home during this time. Did not tolerate Trelegy, Breztri no as effective. Flares in the spring and fall.  Interim history - Breo + Spiriva seems to work the best but still requiring daily albuterol use. Not sure about starting biologics as potentially wants to get pregnant next year.   Today's spirometry showed some restriction.  Gave handout on Xolair - which has the most data in pregnancy and okay to continue during pregnancy.  IgE on 04/16/2021 was 522.  Xolair 375mg  every 2 weeks.  Daily controller medication(s):  ? Continue Breo 234mcg 1 puff once a day and rinse mouth after each use.  ? Continue Spiriva 1.83mcg 2 puff once a day.  ? Continue Singulair 10mg  daily  May use albuterol rescue inhaler 2 puffs or nebulizer every 4 to 6 hours as needed for shortness of breath, chest tightness, coughing, and wheezing. May use albuterol rescue inhaler 2 puffs 5 to 15 minutes prior to strenuous physical activities. Monitor frequency of use.   Get spirometry at next visit.

## 2021-12-21 ENCOUNTER — Telehealth: Payer: Self-pay | Admitting: *Deleted

## 2021-12-21 NOTE — Telephone Encounter (Signed)
-----   Message from Garnet Sierras, DO sent at 12/20/2021  5:03 PM EST ----- Please start PA for Xolair for asthma. Patient planning on getting pregnant and other biologics not enough data during pregnancy. IgE on 04/16/2021 was 522 - start Xolair 375mg  every 2 weeks. Thank you.

## 2021-12-21 NOTE — Telephone Encounter (Signed)
L/M for patient to contact me to advise approval, copay card and submit for Xolair to [facility]

## 2021-12-24 DIAGNOSIS — M7662 Achilles tendinitis, left leg: Secondary | ICD-10-CM | POA: Diagnosis not present

## 2021-12-24 DIAGNOSIS — R2689 Other abnormalities of gait and mobility: Secondary | ICD-10-CM | POA: Diagnosis not present

## 2021-12-24 DIAGNOSIS — M79672 Pain in left foot: Secondary | ICD-10-CM | POA: Diagnosis not present

## 2021-12-24 DIAGNOSIS — R531 Weakness: Secondary | ICD-10-CM | POA: Diagnosis not present

## 2021-12-30 NOTE — Telephone Encounter (Signed)
L/m for patient to contact me  

## 2021-12-31 ENCOUNTER — Telehealth: Payer: BC Managed Care – PPO | Admitting: Family

## 2021-12-31 DIAGNOSIS — R051 Acute cough: Secondary | ICD-10-CM

## 2021-12-31 DIAGNOSIS — J019 Acute sinusitis, unspecified: Secondary | ICD-10-CM

## 2021-12-31 DIAGNOSIS — J4540 Moderate persistent asthma, uncomplicated: Secondary | ICD-10-CM

## 2021-12-31 MED ORDER — PREDNISONE 10 MG (21) PO TBPK: ORAL_TABLET | ORAL | 0 refills | Status: DC

## 2021-12-31 MED ORDER — DOXYCYCLINE HYCLATE 100 MG PO TABS: 100.0000 mg | ORAL_TABLET | Freq: Two times a day (BID) | ORAL | 0 refills | Status: DC

## 2021-12-31 NOTE — Progress Notes (Signed)
Virtual Visit Consent   Erin Collins, you are scheduled for a virtual visit with a [facility] provider today.     Just as with appointments in the office, your consent must be obtained to participate.  Your consent will be active for this visit and any virtual visit you may have with one of our providers in the next 365 days.     If you have a MyChart account, a copy of this consent can be sent to you electronically.  All virtual visits are billed to your insurance company just like a traditional visit in the office.    As this is a virtual visit, video technology does not allow for your provider to perform a traditional examination.  This may limit your provider's ability to fully assess your condition.  If your provider identifies any concerns that need to be evaluated in person or the need to arrange testing (such as labs, EKG, etc.), we will make arrangements to do so.     Although advances in technology are sophisticated, we cannot ensure that it will always work on either your end or our end.  If the connection with a video visit is poor, the visit may have to be switched to a telephone visit.  With either a video or telephone visit, we are not always able to ensure that we have a secure connection.     I need to obtain your verbal consent now.   Are you willing to proceed with your visit today?    Erin Collins has provided verbal consent on 12/31/2021 for a virtual visit (video or telephone).   Evelina Dun, FNP   Date: 12/31/2021 11:12 AM   Virtual Visit via Video Note   I, Evelina Dun, connected with  Erin Collins  ([id number], July 13, 1976) on 12/31/21 at 11:15 AM EST by a video-enabled telemedicine application and verified that I am speaking with the correct person using two identifiers.  Location: Patient: Virtual Visit Location Patient: Home Provider: Virtual Visit Location Provider: Home Office   I discussed the limitations of evaluation and management by  telemedicine and the availability of in person appointments. The patient expressed understanding and agreed to proceed.    History of Present Illness: Erin Collins is a 46 y.o. who identifies as a female who was assigned female at birth, and is being seen today for cough.  HPI: Cough This is a new problem. The current episode started 1 to 4 weeks ago. The problem has been gradually worsening. The problem occurs every few minutes. The cough is Productive of sputum. Associated symptoms include chills, ear congestion, ear pain, a fever, headaches, myalgias, nasal congestion, postnasal drip, shortness of breath (in AM and night time) and wheezing. Associated symptoms comments: Sinus pressure. She has tried rest, OTC cough suppressant and OTC inhaler for the symptoms. The treatment provided mild relief. Her past medical history is significant for asthma.   Problems:  Patient Active Problem List   Diagnosis Date Noted   Insomnia 08/19/2021   Not well controlled moderate persistent asthma 04/16/2021   Sinusitis 10/08/2020   Osteoporosis 02/04/2020   Seasonal and perennial allergic rhinoconjunctivitis 12/31/2018   Other atopic dermatitis 11/28/2018   Multiple drug allergies 11/28/2018   Healthcare maintenance 05/06/2018    Allergies:  Allergies  Allergen Reactions   Amoxicillin Hives   Cefuroxime Axetil Hives   Hydrocodone Itching   Levofloxacin Other (See Comments)    tendonitis   Morphine Itching   Oxycodone-Acetaminophen  Itching   Penicillins Hives    Has patient had a PCN reaction causing immediate rash, facial/tongue/throat swelling, SOB or lightheadedness with hypotension: Yes Has patient had a PCN reaction causing severe rash involving mucus membranes or skin necrosis: No Has patient had a PCN reaction that required hospitalization: No Has patient had a PCN reaction occurring within the last 10 years: No If all of the above answers are "NO", then may proceed with Cephalosporin  use.    Propoxyphene Itching   Medications:  Current Outpatient Medications:    doxycycline (VIBRA-TABS) 100 MG tablet, Take 1 tablet (100 mg total) by mouth 2 (two) times daily., Disp: 20 tablet, Rfl: 0   predniSONE (STERAPRED UNI-PAK 21 TAB) 10 MG (21) TBPK tablet, Use as directed, Disp: 21 tablet, Rfl: 0   albuterol (PROVENTIL) (2.5 MG/3ML) 0.083% nebulizer solution, Take 3 mLs (2.5 mg total) by nebulization every 4 (four) hours as needed for wheezing or shortness of breath (coughing fits)., Disp: 75 mL, Rfl: 2   albuterol (VENTOLIN HFA) 108 (90 Base) MCG/ACT inhaler, Inhale 2 puffs into the lungs every 4 (four) hours as needed for wheezing or shortness of breath (coughing fits)., Disp: 18 g, Rfl: 1   azelastine (ASTELIN) 0.1 % nasal spray, Place 1-2 sprays into both nostrils 2 (two) times daily as needed (nasal drainage). Use in each nostril as directed, Disp: 30 mL, Rfl: 5   fluticasone furoate-vilanterol (BREO ELLIPTA) 200-25 MCG/INH AEPB, Inhale 1 puff into the lungs daily. Rinse mouth after each use., Disp: 60 each, Rfl: 5   meloxicam (MOBIC) 15 MG tablet, Take 1 tablet (15 mg total) by mouth daily., Disp: 30 tablet, Rfl: 3   montelukast (SINGULAIR) 10 MG tablet, Take 1 tablet (10 mg total) by mouth at bedtime., Disp: 30 tablet, Rfl: 5   Spacer/Aero-Holding Chambers (EQ SPACE CHAMBER ANTI-STATIC) DEVI, Inhale into the lungs as directed., Disp: , Rfl:    Tiotropium Bromide Monohydrate (SPIRIVA RESPIMAT) 1.25 MCG/ACT AERS, Inhale 2 puffs into the lungs daily., Disp: 4 g, Rfl: 5  Observations/Objective: Patient is well-developed, well-nourished in no acute distress.  Resting comfortably  at home.  Head is normocephalic, atraumatic.  No labored breathing.  Speech is clear and coherent with logical content.  Patient is alert and oriented at baseline.  Coarse cough Nasal congestion  Assessment and Plan: 1. Not well controlled moderate persistent asthma - doxycycline (VIBRA-TABS) 100 MG  tablet; Take 1 tablet (100 mg total) by mouth 2 (two) times daily.  Dispense: 20 tablet; Refill: 0 - predniSONE (STERAPRED UNI-PAK 21 TAB) 10 MG (21) TBPK tablet; Use as directed  Dispense: 21 tablet; Refill: 0  2. Acute cough  3. Acute sinusitis, recurrence not specified, unspecified location - doxycycline (VIBRA-TABS) 100 MG tablet; Take 1 tablet (100 mg total) by mouth 2 (two) times daily.  Dispense: 20 tablet; Refill: 0 - predniSONE (STERAPRED UNI-PAK 21 TAB) 10 MG (21) TBPK tablet; Use as directed  Dispense: 21 tablet; Refill: 0  - Take meds as prescribed - Use a cool mist humidifier  -Use saline nose sprays frequently -Force fluids -For any cough or congestion  Use plain Mucinex- regular strength or max strength is fine -For fever or aces or pains- take tylenol or ibuprofen. -Throat lozenges if help -Follow up if symptoms worsen or do not improve   Follow Up Instructions: I discussed the assessment and treatment plan with the patient. The patient was provided an opportunity to ask questions and all were answered. The patient agreed  with the plan and demonstrated an understanding of the instructions.  A copy of instructions were sent to the patient via MyChart unless otherwise noted below.    The patient was advised to call back or seek an in-person evaluation if the symptoms worsen or if the condition fails to improve as anticipated.  Time:  I spent 7 minutes with the patient via telehealth technology discussing the above problems/concerns.    Evelina Dun, FNP

## 2021-12-31 NOTE — Patient Instructions (Signed)

## 2022-01-04 ENCOUNTER — Ambulatory Visit: Payer: BC Managed Care – PPO | Admitting: Allergy

## 2022-01-11 ENCOUNTER — Encounter: Payer: Self-pay | Admitting: *Deleted

## 2022-01-11 NOTE — Telephone Encounter (Signed)
My chart message sent to patient to contact me if she is still interested in [place] therapy

## 2022-01-20 NOTE — Telephone Encounter (Signed)
Patient never returned calls to me

## 2022-01-20 NOTE — Telephone Encounter (Signed)
Noted  

## 2022-01-21 DIAGNOSIS — R2689 Other abnormalities of gait and mobility: Secondary | ICD-10-CM | POA: Diagnosis not present

## 2022-01-21 DIAGNOSIS — M79672 Pain in left foot: Secondary | ICD-10-CM | POA: Diagnosis not present

## 2022-01-21 DIAGNOSIS — M7662 Achilles tendinitis, left leg: Secondary | ICD-10-CM | POA: Diagnosis not present

## 2022-01-21 DIAGNOSIS — R531 Weakness: Secondary | ICD-10-CM | POA: Diagnosis not present

## 2022-01-22 ENCOUNTER — Other Ambulatory Visit: Payer: Self-pay | Admitting: Allergy

## 2022-01-25 ENCOUNTER — Encounter: Payer: BC Managed Care – PPO | Admitting: Podiatry

## 2022-02-01 ENCOUNTER — Encounter: Payer: BC Managed Care – PPO | Admitting: Podiatry

## 2022-02-04 DIAGNOSIS — M7662 Achilles tendinitis, left leg: Secondary | ICD-10-CM | POA: Diagnosis not present

## 2022-02-04 DIAGNOSIS — M79672 Pain in left foot: Secondary | ICD-10-CM | POA: Diagnosis not present

## 2022-02-04 DIAGNOSIS — R531 Weakness: Secondary | ICD-10-CM | POA: Diagnosis not present

## 2022-02-04 DIAGNOSIS — R2689 Other abnormalities of gait and mobility: Secondary | ICD-10-CM | POA: Diagnosis not present

## 2022-02-10 ENCOUNTER — Ambulatory Visit (INDEPENDENT_AMBULATORY_CARE_PROVIDER_SITE_OTHER): Payer: BC Managed Care – PPO | Admitting: Podiatry

## 2022-02-10 ENCOUNTER — Other Ambulatory Visit: Payer: Self-pay

## 2022-02-10 DIAGNOSIS — M7662 Achilles tendinitis, left leg: Secondary | ICD-10-CM | POA: Diagnosis not present

## 2022-02-10 DIAGNOSIS — M9262 Juvenile osteochondrosis of tarsus, left ankle: Secondary | ICD-10-CM

## 2022-02-10 DIAGNOSIS — M21862 Other specified acquired deformities of left lower leg: Secondary | ICD-10-CM

## 2022-02-10 DIAGNOSIS — M62462 Contracture of muscle, left lower leg: Secondary | ICD-10-CM

## 2022-02-14 NOTE — Progress Notes (Signed)
°  Subjective:  Patient ID: Erin Collins, female    DOB: August 28, 1976,  MRN: [id number]  Chief Complaint  Patient presents with   Tendonitis    POV #4 DOS 10/29/2021 ACHILLES REPAIR LT W/TOPAZ, PRP INJECTION      46 y.o. female returns for post-op check.  Still having quite a bit of pain.  Has not improved much since last visit and feels like she is completely plateaued.  Sometimes pain on the outside of the heel as well.  Focus have improved.  Cannot get back to her full activity of exercise.  Review of Systems: Negative except as noted in the HPI. Denies N/V/F/Ch.   Objective:  There were no vitals filed for this visit. There is no height or weight on file to calculate BMI. Constitutional Well developed. Well nourished.  Vascular Foot warm and well perfused. Capillary refill normal to all digits.   Neurologic Normal speech. Oriented to person, place, and time. Epicritic sensation to light touch grossly present bilaterally.  Dermatologic Topaz penetration sites are well-healed  Orthopedic: Tenderness to palpation at the insertion of the Achilles on the posterior heel.  She has gastrocnemius equinus.  Palpable fullness at the posterior heel and Achilles   Prior radiographs show Haglund's deformity and posterior calcaneal enthesophytes  MRI dated 07/20/2021 IMPRESSION: Mild insertional Achilles tendinosis with enthesophyte formation.   Intact ankle ligaments.  No acute tendon tear.     Electronically Signed   By: Maurine Simmering   On: 07/20/2021 15:15 Assessment:   1. Tendonitis, Achilles, left   2. Gastrocnemius equinus of left lower extremity   3. Haglund's deformity of left heel     Plan:  Patient was evaluated and treated and all questions answered.  S/p foot surgery left -Reviewed and discussed further treatment options.  She so far has not had much improvement with a PRP injection and Topaz procedure.  She is interested in exploring further definitive options and I  think this may be time as well.  I have ordered an MRI to compare to her prior MRI and evaluate for surgical planning for possible retrocalcaneal exostectomy, Haglund's deformity resection and debridement of the Achilles tendon.  She will see me back after the MRI and we will plan for surgical intervention if indicated.  Return for after MRI to review.

## 2022-02-27 ENCOUNTER — Ambulatory Visit
Admission: RE | Admit: 2022-02-27 | Discharge: 2022-02-27 | Disposition: A | Discharge status: Home / Self Care | Payer: BC Managed Care – PPO | Source: Ambulatory Visit | Attending: Podiatry | Admitting: Podiatry

## 2022-02-27 ENCOUNTER — Other Ambulatory Visit: Payer: Self-pay

## 2022-02-27 DIAGNOSIS — M9262 Juvenile osteochondrosis of tarsus, left ankle: Secondary | ICD-10-CM

## 2022-02-27 DIAGNOSIS — M7662 Achilles tendinitis, left leg: Secondary | ICD-10-CM

## 2022-02-27 DIAGNOSIS — M62462 Contracture of muscle, left lower leg: Secondary | ICD-10-CM

## 2022-02-27 DIAGNOSIS — M21862 Other specified acquired deformities of left lower leg: Secondary | ICD-10-CM

## 2022-03-08 ENCOUNTER — Other Ambulatory Visit: Payer: Self-pay

## 2022-03-08 ENCOUNTER — Ambulatory Visit: Payer: BC Managed Care – PPO | Admitting: Podiatry

## 2022-03-08 DIAGNOSIS — M7662 Achilles tendinitis, left leg: Secondary | ICD-10-CM

## 2022-03-08 DIAGNOSIS — M9262 Juvenile osteochondrosis of tarsus, left ankle: Secondary | ICD-10-CM

## 2022-03-08 DIAGNOSIS — M21862 Other specified acquired deformities of left lower leg: Secondary | ICD-10-CM

## 2022-03-11 NOTE — Progress Notes (Signed)
?  Subjective:  ?Patient ID: Erin Collins, female    DOB: 02-Dec-1976,  MRN: [id number] ? ?Chief Complaint  ?Patient presents with  ? Tendonitis  ? ? ? ? ?46 y.o. female returns for post-op check.  Completed the MRI since last visit still very painful ? ?Review of Systems: Negative except as noted in the HPI. Denies N/V/F/Ch. ? ? ?Objective:  ?There were no vitals filed for this visit. ?There is no height or weight on file to calculate BMI. ?Constitutional Well developed. ?Well nourished.  ?Vascular Foot warm and well perfused. ?Capillary refill normal to all digits.   ?Neurologic Normal speech. ?Oriented to person, place, and time. ?Epicritic sensation to light touch grossly present bilaterally.  ?Dermatologic Topaz penetration sites are well-healed  ?Orthopedic: Tenderness to palpation at the insertion of the Achilles on the posterior heel.  She has gastrocnemius equinus.  Palpable fullness at the posterior heel and Achilles  ? ?Prior radiographs show Haglund's deformity and posterior calcaneal enthesophytes ? ?MRI dated 07/20/2021 ?IMPRESSION: ?Mild insertional Achilles tendinosis with enthesophyte formation. ?  ?Intact ankle ligaments.  No acute tendon tear. ?  ?  ?Electronically Signed ?  By: Maurine Simmering ?  On: 07/20/2021 15:15 ? ? ?New MRI dated 02/27/2022 shows increasing progression of Achilles tendinosis ?Assessment:  ? ?1. Tendonitis, Achilles, left   ?2. Gastrocnemius equinus of left lower extremity   ?3. Haglund's deformity of left heel   ? ? ? ?Plan:  ?Patient was evaluated and treated and all questions answered. ? ?Reviewed the MRI results and images together.  We discussed that the tendinosis has been progressing since the Topaz procedure and this is consistent with her clinical progression.  At this point I think she has failed all reasonable conservative and minimally invasive options to address her Achilles pathology.  I recommended surgical intervention with secondary pair debridement of the Achilles  tendon, resection of the posterior spur and Haglund's deformity and a gastrocnemius recession.  We discussed the postoperative course including the period of nonweightbearing for 6 weeks and then transition to a boot and physical therapy thereafter.  I discussed with her the risk benefits and potential complications that may include but not limited to pain, swelling, infection, scar, numbness which may be temporary or permanent, chronic pain, stiffness, nerve pain or damage, wound healing problems, bone healing problems including delayed or non-union.  She understands and wishes to proceed with surgery at this point.  All questions were addressed.  No guarantees as to the outcome of the surgery were able to be made.  Informed consent was signed and reviewed.  Surgery will be scheduled at a mutually agreeable date. ? ? ?Surgical plan: ? ?Procedure: ?-Secondary repair of Achilles tendon, gastrocnemius recession, resection of Haglund deformity and retrocalcaneal exostosis ? ?Location: ?-[facility] ? ?Anesthesia plan: ?-General anesthesia with regional block in prone position ? ?Postoperative pain plan: ?- Tylenol 1000 mg every 6 hours, ibuprofen 600 mg every 6 hours, gabapentin 300 mg every 8 hours x5 days, oxycodone 5 mg 1-2 tabs every 6 hours only as needed, Zofran ? ?DVT prophylaxis: ?-Xarelto 10 mg nightly ? ?WB Restrictions / DME needs: ?-NWB in cast postop ? ? ? ?No follow-ups on file.  ? ?

## 2022-03-23 NOTE — Progress Notes (Deleted)
? ?Follow Up Note ? ?RE: Erin Collins MRN: [id number] DOB: Mar 28, 1976 ?Date of Office Visit: 03/24/2022 ? ?Referring provider: Maudie Mercury, MD ?Primary care provider: Maudie Mercury, MD ? ?Chief Complaint: No chief complaint on file. ? ?History of Present Illness: ?I had the pleasure of seeing Erin Collins for a follow up visit at the [facility] on 03/23/2022. She is a 46 y.o. female, who is being followed for asthma, allergic rhinoconjunctivitis, atopic dermatitis, and multiple drug allergies. Her previous allergy office visit was on 12/20/2021 with Dr. Maudie Mercury. Today is a regular follow up visit. ? ?Not well controlled moderate persistent asthma ?Past history - Diagnosed with asthma over 19 years ago. Patient moved in with her parents who have cats and dogs at home during this time. Did not tolerate Trelegy, Breztri no as effective. Flares in the spring and fall.  ?Interim history - Breo + Spiriva seems to work the best but still requiring daily albuterol use. Not sure about starting biologics as potentially wants to get pregnant next year.  ?Today's spirometry showed some restriction. ?Gave handout on Xolair - which has the most data in pregnancy and okay to continue during pregnancy. ?IgE on 04/16/2021 was 522. ?Xolair '375mg'$  every 2 weeks. ?Daily controller medication(s):  ?Continue Breo 234mg 1 puff once a day and rinse mouth after each use.  ?Continue Spiriva 1.266m 2 puff once a day.  ?Continue Singulair '10mg'$  daily ?May use albuterol rescue inhaler 2 puffs or nebulizer every 4 to 6 hours as needed for shortness of breath, chest tightness, coughing, and wheezing. May use albuterol rescue inhaler 2 puffs 5 to 15 minutes prior to strenuous physical activities. Monitor frequency of use.  ?Get spirometry at next visit.  ?  ?Seasonal and perennial allergic rhinoconjunctivitis ?Past history - Perennial rhinoconjunctivitis symptoms for the past 20 years with worsening in the spring and fall.   Allergy immunotherapy for 10 years with good benefit. 2019 skin testing showed, positive to dust mites, cat, dog, grass and horse. 2 cats at home. 2022 bloodwork positive to dust mites, cat, dog, grass pollen. Borderline to cockroach, mouse and maple/box elder tree pollen.  ?Interim history - stable.  ?Continue environmental control measures - especially regarding the cats. ?Continue Singulair (montelukast) '10mg'$  daily.  ?Use over the counter antihistamines such as Zyrtec (cetirizine), Claritin (loratadine), Allegra (fexofenadine), or Xyzal (levocetirizine) daily as needed. May take twice a day during allergy flares. May switch antihistamines every few months. ?May use Flonase sensamist 2 sprays per nostril once day for nasal congestion as needed.  ?Use azelastine nasal spray 1-2 sprays per nostril twice a day as needed for runny nose/drainage. ?Nasal saline spray (i.e., Simply Saline) or nasal saline lavage (i.e., NeilMed) is recommended as needed and prior to medicated nasal sprays. ?May use refresh eye drops.  ? Need asthma to be more stable before starting AIT. ?  ?Other atopic dermatitis ?Past history - Eucrisa not effective.  ?Continue proper skin care measures.  ?May use triamcinolone 0.1% ointment twice a day as needed for the hands. Do not use on the face, neck, armpits or groin area. Do not use more than 3 weeks in a row.  ?  ?Multiple drug allergies ?Past history - reactions to amoxicillin, cefuroxime, hydrocodone, levofloxacin, morphine, oxycodone, and propoxyphene in the past.  ?Continue to avoid all these medications.  ? ?Assessment and Plan: ?Erin Collins a [age].o. female with: ?No problem-specific Assessment & Plan notes found for this encounter. ? ?No  follow-ups on file. ? ?No orders of the defined types were placed in this encounter. ? ?Lab Orders  ?No laboratory test(s) ordered today  ? ? ?Diagnostics: ?Spirometry:  ?Tracings reviewed. Her effort: {Blank single:19197::"Good reproducible efforts.","It  was hard to get consistent efforts and there is a question as to whether this reflects a maximal maneuver.","Poor effort, data can not be interpreted."} ?FVC: ***L ?FEV1: ***L, ***% predicted ?FEV1/FVC ratio: ***% ?Interpretation: {Blank single:19197::"Spirometry consistent with mild obstructive disease","Spirometry consistent with moderate obstructive disease","Spirometry consistent with severe obstructive disease","Spirometry consistent with possible restrictive disease","Spirometry consistent with mixed obstructive and restrictive disease","Spirometry uninterpretable due to technique","Spirometry consistent with normal pattern","No overt abnormalities noted given today's efforts"}.  ?Please see scanned spirometry results for details. ? ?Skin Testing: {Blank single:19197::"Select foods","Environmental allergy panel","Environmental allergy panel and select foods","Food allergy panel","None","Deferred due to recent antihistamines use"}. ?*** ?Results discussed with patient/family. ? ? ?Medication List:  ?Current Outpatient Medications  ?Medication Sig Dispense Refill  ?? albuterol (PROVENTIL) (2.5 MG/3ML) 0.083% nebulizer solution Take 3 mLs (2.5 mg total) by nebulization every 4 (four) hours as needed for wheezing or shortness of breath (coughing fits). 75 mL 2  ?? azelastine (ASTELIN) 0.1 % nasal spray Place 1-2 sprays into both nostrils 2 (two) times daily as needed (nasal drainage). Use in each nostril as directed 30 mL 5  ?? doxycycline (VIBRA-TABS) 100 MG tablet Take 1 tablet (100 mg total) by mouth 2 (two) times daily. 20 tablet 0  ?? fluticasone furoate-vilanterol (BREO ELLIPTA) 200-25 MCG/INH AEPB Inhale 1 puff into the lungs daily. Rinse mouth after each use. 60 each 5  ?? meloxicam (MOBIC) 15 MG tablet Take 1 tablet (15 mg total) by mouth daily. 30 tablet 3  ?? montelukast (SINGULAIR) 10 MG tablet Take 1 tablet (10 mg total) by mouth at bedtime. 30 tablet 5  ?? predniSONE (STERAPRED UNI-PAK 21 TAB) 10 MG  (21) TBPK tablet Use as directed 21 tablet 0  ?? Spacer/Aero-Holding Chambers (EQ SPACE CHAMBER ANTI-STATIC) DEVI Inhale into the lungs as directed.    ?? Tiotropium Bromide Monohydrate (SPIRIVA RESPIMAT) 1.25 MCG/ACT AERS Inhale 2 puffs into the lungs daily. 4 g 5  ?? VENTOLIN HFA 108 (90 Base) MCG/ACT inhaler INHALE 1 TO 2 PUFFS BY MOUTH EVERY 6 HOURS AS NEEDED FOR WHEEZING OR SHORTNESS OF BREATH 18 each 1  ? ?No current facility-administered medications for this visit.  ? ?Allergies: ?Allergies  ?Allergen Reactions  ?? Amoxicillin Hives  ?? Cefuroxime Axetil Hives  ?? Hydrocodone Itching  ?? Levofloxacin Other (See Comments)  ?  tendonitis  ?? Morphine Itching  ?? Oxycodone-Acetaminophen Itching  ?? Penicillins Hives  ?  Has patient had a PCN reaction causing immediate rash, facial/tongue/throat swelling, SOB or lightheadedness with hypotension: Yes ?Has patient had a PCN reaction causing severe rash involving mucus membranes or skin necrosis: No ?Has patient had a PCN reaction that required hospitalization: No ?Has patient had a PCN reaction occurring within the last 10 years: No ?If all of the above answers are "NO", then may proceed with Cephalosporin use. ?  ?? Propoxyphene Itching  ? ?I reviewed her past medical history, social history, family history, and environmental history and no significant changes have been reported from her previous visit. ? ?Review of Systems  ?Constitutional:  Negative for appetite change, chills, fatigue, fever and unexpected weight change.  ?HENT:  Negative for congestion, postnasal drip and rhinorrhea.   ?Eyes:  Positive for itching.  ?Respiratory:  Positive for chest tightness. Negative for cough, shortness  of breath and wheezing.   ?Cardiovascular:  Negative for chest pain.  ?Gastrointestinal:  Negative for abdominal pain.  ?Genitourinary:  Negative for difficulty urinating.  ?Skin:  Negative for rash.  ?Allergic/Immunologic: Positive for environmental allergies. Negative for  food allergies.  ? ?Objective: ?There were no vitals taken for this visit. ?There is no height or weight on file to calculate BMI. ?Physical Exam ?Vitals and nursing note reviewed.  ?Constitutional:   ?   Ap

## 2022-03-24 ENCOUNTER — Ambulatory Visit: Payer: BC Managed Care – PPO | Admitting: Allergy

## 2022-04-01 ENCOUNTER — Telehealth: Payer: Self-pay | Admitting: Urology

## 2022-04-01 NOTE — Telephone Encounter (Signed)
DOS - 04/29/22 ? ?CALCANEAL OSTECTOMY LEFT --- 74081 ?GASTROCNEMIUS RECESS LEFT --- 44818 ?REPAIR ACHILLES TENDON LEFT --- 56314 ?TENDON TRANSFER LEFT --- 97026  ? ?[employer] EFFECTIVE DATE - 05/19/21 ? ?PLAN DEDUCTIBLE  - $2,500.00 W/ $0.00 REMAINING ?OUT OF POCKET - $6,000.00 W/ $797.27 REMAINING ?COINSURANCE - 20% ?COPAY - $0.00 ? ? ?NO PRIOR AUTH REQUIRED. ?

## 2022-04-04 DIAGNOSIS — Z03818 Encounter for observation for suspected exposure to other biological agents ruled out: Secondary | ICD-10-CM | POA: Diagnosis not present

## 2022-04-04 DIAGNOSIS — H109 Unspecified conjunctivitis: Secondary | ICD-10-CM | POA: Diagnosis not present

## 2022-04-04 DIAGNOSIS — J029 Acute pharyngitis, unspecified: Secondary | ICD-10-CM | POA: Diagnosis not present

## 2022-04-04 DIAGNOSIS — Z1152 Encounter for screening for COVID-19: Secondary | ICD-10-CM | POA: Diagnosis not present

## 2022-04-18 ENCOUNTER — Ambulatory Visit (INDEPENDENT_AMBULATORY_CARE_PROVIDER_SITE_OTHER): Payer: BC Managed Care – PPO | Admitting: Internal Medicine

## 2022-04-18 ENCOUNTER — Other Ambulatory Visit: Payer: Self-pay

## 2022-04-18 ENCOUNTER — Encounter: Payer: Self-pay | Admitting: Internal Medicine

## 2022-04-18 VITALS — BP 139/76 | HR 86 | Temp 98.4°F | Ht 64.0 in | Wt 198.9 lb

## 2022-04-18 DIAGNOSIS — H1010 Acute atopic conjunctivitis, unspecified eye: Secondary | ICD-10-CM | POA: Diagnosis not present

## 2022-04-18 DIAGNOSIS — R0683 Snoring: Secondary | ICD-10-CM

## 2022-04-18 DIAGNOSIS — J3089 Other allergic rhinitis: Secondary | ICD-10-CM

## 2022-04-18 DIAGNOSIS — R519 Headache, unspecified: Secondary | ICD-10-CM

## 2022-04-18 DIAGNOSIS — J302 Other seasonal allergic rhinitis: Secondary | ICD-10-CM

## 2022-04-18 MED ORDER — SUMATRIPTAN SUCCINATE 50 MG PO TABS: 50.0000 mg | ORAL_TABLET | Freq: Once | ORAL | 0 refills | Status: DC | PRN

## 2022-04-18 NOTE — Patient Instructions (Addendum)
To Erin Collins,  ? ?It was a pleasure seeing you today! Today we discussed your upcoming surgery, allergies, and headaches.  ? ?For your upcoming surgery, please let us know if your surgeon needs anything from our office.  ? ?For your allergies, you have some symptoms of post nasal drip, I would recommend continuing your current regimen, and following up with Dr. Maudie Mercury.  ? ?For your headaches, there may be several factors at play. Given that bright light and sounds worsen your headache and they last 2-3 days, migraines may be more at play than cluster headaches. I am going to prescribe a migraine medication called Imitrex that you can take one time daily to help your headaches. I think that trying to wean off your Excedrin will also be beneficial. Lastly, you may have a component of sleep apnea, for which we will order a sleep study.  ? ?It has been a pleasure working with you over the past several years, and I hope you stay well!  ? ?Please follow up in our clinic in 3 months to meet your new PCP. If your headaches continue, please reach back out to [place].  ? ?Have a good day,  ?Maudie Mercury, MD ?

## 2022-04-19 NOTE — Progress Notes (Signed)
? ?  CC: Headaches ? ?HPI: ? ?[name] is a 46 y.o. person, with a PMH noted below, who presents to the clinic headaches and allergies. To see the management of their acute and chronic conditions, please see the A&P note under the Encounters tab.  ? ?Past Medical History:  ?Diagnosis Date  ? Anemia   ? Angio-edema   ? Asthma   ? Basal cell carcinoma ~ 1999  ? collarbone  ? Blood clot in vein ~ 2008  ? "left breast"   ? Complication of anesthesia   ? "I have trouble waking up afterwards" (12/18/2017)  ? Diarrhea 04/01/2019  ? Eczema   ? Family history of adverse reaction to anesthesia   ? "mom has trouble waking up afterwards" (12/18/2017)  ? Fluttering heart   ? "comes and goes" (12/18/2017)  ? GERD (gastroesophageal reflux disease)   ? Headache   ? "weekly" (12/18/2017)  ? History of kidney stones   ? Recurrent upper respiratory infection (URI)   ? 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 "bland stare"  ? Torticollis   ? Vertigo   ? ?Review of Systems:   ?Review of Systems  ?Constitutional:  Negative for chills, fever, malaise/fatigue and weight loss.  ?HENT:  Positive for congestion. Negative for nosebleeds and sinus pain.   ?Eyes:  Positive for photophobia. Negative for double vision and pain.  ?Gastrointestinal:  Negative for abdominal pain, nausea and vomiting.  ?Neurological:  Positive for headaches. Negative for tingling and tremors.  ?Psychiatric/Behavioral:  Negative for depression.    ? ?Physical Exam: ? ?Vitals:  ? 04/18/22 1453  ?BP: 139/76  ?Pulse: 86  ?Temp: 98.4 ?F (36.9 ?C)  ?TempSrc: Oral  ?SpO2: 98%  ?Weight: 198 lb 14.4 oz (90.2 kg)  ?Height: '5\' 4"'$  (1.626 m)  ? ?Physical Exam ?Constitutional:   ?   General: She is not in acute distress. ?   Appearance: Normal appearance. She is not ill-appearing, toxic-appearing or diaphoretic.  ?HENT:  ?   Head: Normocephalic.  ?Eyes:  ?   General: No scleral icterus.    ?   Right eye: No discharge.     ?   Left eye: No discharge.  ?   Extraocular Movements:  Extraocular movements intact.  ?   Conjunctiva/sclera: Conjunctivae normal.  ?   Pupils: Pupils are equal, round, and reactive to light.  ?Cardiovascular:  ?   Rate and Rhythm: Normal rate and regular rhythm.  ?   Pulses: Normal pulses.  ?   Heart sounds: Normal heart sounds. No murmur heard. ?  No friction rub. No gallop.  ?Pulmonary:  ?   Effort: Pulmonary effort is normal.  ?   Breath sounds: Normal breath sounds. No wheezing, rhonchi or rales.  ?Abdominal:  ?   General: Abdomen is flat. Bowel sounds are normal. There is no distension.  ?   Palpations: Abdomen is soft.  ?   Tenderness: There is no abdominal tenderness.  ?Neurological:  ?   Mental Status: She is alert and oriented to person, place, and time.  ?   Cranial Nerves: No cranial nerve deficit.  ?Psychiatric:     ?   Mood and Affect: Mood normal.     ?   Behavior: Behavior normal.  ?  ? ?Assessment & Plan:  ? ?See Encounters Tab for problem based charting. ? ?Patient discussed with Dr.  Cain Sieve ? ?

## 2022-04-20 DIAGNOSIS — R0683 Snoring: Secondary | ICD-10-CM | POA: Insufficient documentation

## 2022-04-20 DIAGNOSIS — R519 Headache, unspecified: Secondary | ICD-10-CM | POA: Insufficient documentation

## 2022-04-20 NOTE — Assessment & Plan Note (Signed)
Patient presents today with recurrent headaches for the past 2 to 3 months. She states that these headaches are located right above the right eye, with no radiation, and feels "like someone is stabbing me with a sharp knife in the eye repeatedly." She states that bright lights exacerbate her headaches, as well as loud sounds. She states that they are constant and will last 2 to 3 days, makes it difficult for her to sleep. She has been taking Excedrin for these headaches, as well as treating the Excedrin with a caffeinated beverage. She has been taking Excedrin for a long time.  She states that when she does try to stop the Excedrin, she gets horrible headaches as well.  She states that she does have a history of cluster headaches, but these feel to be lasting longer than her typical cluster headaches, which normally appear in the eye, but also happen laterally as well as posteriorly to her head.  She denies any visual changes, and has not experienced any auras. ? ?A/P: ?Patient presents with persistent headaches that last typically 2 to 3 days with photophobia and hyperacusis. She states that she has a history of cluster headaches, but given the duration, no other symptoms this is less likely.  She additionally does take Excedrin daily, certainly may be a component of medication induced headache.  But given her symptoms, migraines are most likely.  We discussed today slowly tapering off the Excedrin, as well as starting Imitrex for migraines.  Patient is amenable to these changes. ?- Start Imitrex 50 mg daily as needed for migraines ?- Patient to wean off Excedrin, can continue drinking caffeinated beverages to ease withdrawal, but will ultimately taper this as well. ?- Follow-up at next appointment ?

## 2022-04-20 NOTE — Progress Notes (Signed)
Internal Medicine Clinic Attending ? ?Case discussed with Dr. Winters  At the time of the visit.  We reviewed the resident?s history and exam and pertinent patient test results.  I agree with the assessment, diagnosis, and plan of care documented in the resident?s note.  ?

## 2022-04-20 NOTE — Assessment & Plan Note (Signed)
Patient presents to the clinic with worsening of her seasonal allergies. On examination today, she does have some rhinorrhea, as well as cobblestoning. ?- Patient to follow-up with her allergist Dr. Maudie Mercury. ?- Continue current regimen of Singulair 10 mg, Flonase 2 sprays per nostril per day, azelastine nasal spray 1 to 2 sprays per nostril per day, saline spray ?

## 2022-04-20 NOTE — Assessment & Plan Note (Signed)
Patient presents with concerns for snoring and daytime somnolence. She states for the past several months, her partner has noticed that she has been snoring loudly, sleep talking, and will occasionally stop breathing. She has several recordings of her sleep talking and snoring when she played in the office today. ? ?A/P: ?Patient presents with loud snoring, daytime somnolence and fatigue, and her partner noticing her stop breathing which puts her at an intermediate risk for sleep apnea.  We will obtain sleep study ?- Ambulatory referral for sleep study. ?

## 2022-04-28 ENCOUNTER — Ambulatory Visit (INDEPENDENT_AMBULATORY_CARE_PROVIDER_SITE_OTHER): Payer: BC Managed Care – PPO | Admitting: Internal Medicine

## 2022-04-28 DIAGNOSIS — J302 Other seasonal allergic rhinitis: Secondary | ICD-10-CM | POA: Diagnosis not present

## 2022-04-28 DIAGNOSIS — R519 Headache, unspecified: Secondary | ICD-10-CM

## 2022-04-28 DIAGNOSIS — H1010 Acute atopic conjunctivitis, unspecified eye: Secondary | ICD-10-CM

## 2022-04-28 DIAGNOSIS — R0683 Snoring: Secondary | ICD-10-CM | POA: Diagnosis not present

## 2022-04-28 DIAGNOSIS — B009 Herpesviral infection, unspecified: Secondary | ICD-10-CM | POA: Insufficient documentation

## 2022-04-28 NOTE — Assessment & Plan Note (Signed)
Has not yet heard back about sleep study, referral was not entered correctly. This was fixed today, expect patient to hear back soon. ?

## 2022-04-28 NOTE — Progress Notes (Signed)
? ?  CC: swollen gland ? ?HPI: ? ?[name] is a 46 y.o. PMH noted below, who presents to the [facility] with complaints of swollen gland. To see the management of his acute and chronic conditions, please refer to the A&P note under the encounters tab.  ? ?Past Medical History:  ?Diagnosis Date  ? Anemia   ? Angio-edema   ? Asthma   ? Basal cell carcinoma ~ 1999  ? collarbone  ? Blood clot in vein ~ 2008  ? "left breast"   ? Complication of anesthesia   ? "I have trouble waking up afterwards" (12/18/2017)  ? Diarrhea 04/01/2019  ? Eczema   ? Family history of adverse reaction to anesthesia   ? "mom has trouble waking up afterwards" (12/18/2017)  ? Fluttering heart   ? "comes and goes" (12/18/2017)  ? GERD (gastroesophageal reflux disease)   ? Headache   ? "weekly" (12/18/2017)  ? History of kidney stones   ? Recurrent upper respiratory infection (URI)   ? Seizures ([place]) 1980 X 1  ? "bland stare"  ? Torticollis   ? Vertigo   ? ?Review of Systems:  positive for throat pain, cold sore, shortness of breath; negative for fever, chills, malaise,  ? ?Physical Exam: ?Gen: middle aged woman in NAD ?HEENT: normocephalic atraumatic, mild cobblestoning of the oropharynx, cold sores present along the upper and lower lips, tenderness to palpation of the submandibular area without fluid collection or lymphadenopathy, normal sized thyroid without appreciable nodules ?CV: RRR, no m/r/g   ?Resp: CTAB, normal WOB  ?GI: soft, nontender ?MSK: moves all extremities without difficulty ?Skin:warm and dry, no lower extremity edema ?Neuro:alert answering questions appropriately ?Psych: normal affect ? ? ?Assessment & Plan:  ? ?See Encounters Tab for problem based charting. ? ?Patient discussed with Dr. Cain Sieve  ? ?

## 2022-04-28 NOTE — Assessment & Plan Note (Signed)
Patient has ongoing allergy symptoms and was seen in clinic recently for these. No changes since then and she is happy with the medications she is taking . ?

## 2022-04-28 NOTE — Assessment & Plan Note (Signed)
Patient has stopped her excedrin, is taking sumatriptan which she says helps. However sometimes the headaches will come back later in the day after taking the sumatriptan. May still be related to an improving medication overuse headache however will need to talk about chronic migraine/starting a preventative medication at f/u if headaches do not continue to improve.  ?

## 2022-04-28 NOTE — Patient Instructions (Signed)
Erin Collins ? ?It was a pleasure seeing you in the clinic today.  ? ?We talked about your swollen gland, your upcoming surgery, and the headaches. ? ?Headaches- Don't take the sumatriptan or excedrin tomorrow before surgery. Take tylenol if you have a headache. We will check in and see how your head is doing in about a month ? ?Gland- I don't think you need antibiotics. The pain might be related to your cold sore. ? ?Surgery- good luck tomorrow! ? ?Please call our clinic at [phone] if you have any questions or concerns. The best time to call is Monday-Friday from 9am-4pm, but there is someone available 24/7 at the same number. If you need medication refills, please notify your pharmacy one week in advance and they will send [place] a request. ?  ?Thank you for letting us take part in your care. We look forward to seeing you next time! ? ?

## 2022-04-28 NOTE — Assessment & Plan Note (Addendum)
Patient has two cold sores on her upper and lower lips. Says that she often gets these before she gets sick or in times of stress. She is also complaining of what feels like a swollen gland to her that she wanted to make sure didn't need antibiotics. She has a dry throat at night and some tenderness in her submandibular area. The dry throat may be related to her snoring and the tenderness could be related to her HSV flare. She says she does not get flares very often, has been using an OTC ointment. Has never considered suppressive therapy. Discussed that this could be an option but it would be a daily medication. Encouraged patient to think about it and discuss with provider if she had any interest. ?-CTM ?

## 2022-04-28 NOTE — Progress Notes (Signed)
Internal Medicine Clinic Attending ° °Case discussed with Dr. DeMaio  At the time of the visit.  We reviewed the resident’s history and exam and pertinent patient test results.  I agree with the assessment, diagnosis, and plan of care documented in the resident’s note. ° ° °

## 2022-04-29 ENCOUNTER — Other Ambulatory Visit: Payer: Self-pay | Admitting: Podiatry

## 2022-04-29 DIAGNOSIS — M7662 Achilles tendinitis, left leg: Secondary | ICD-10-CM | POA: Diagnosis not present

## 2022-04-29 DIAGNOSIS — M9262 Juvenile osteochondrosis of tarsus, left ankle: Secondary | ICD-10-CM

## 2022-04-29 DIAGNOSIS — M216X2 Other acquired deformities of left foot: Secondary | ICD-10-CM | POA: Diagnosis not present

## 2022-04-29 DIAGNOSIS — M21862 Other specified acquired deformities of left lower leg: Secondary | ICD-10-CM

## 2022-04-29 DIAGNOSIS — M7732 Calcaneal spur, left foot: Secondary | ICD-10-CM | POA: Diagnosis not present

## 2022-04-29 DIAGNOSIS — G8918 Other acute postprocedural pain: Secondary | ICD-10-CM | POA: Diagnosis not present

## 2022-04-29 MED ORDER — ACETAMINOPHEN 500 MG PO TABS: 500.0000 mg | ORAL_TABLET | Freq: Four times a day (QID) | ORAL | 0 refills | Status: AC | PRN

## 2022-04-29 MED ORDER — HYDROCODONE-ACETAMINOPHEN 5-325 MG PO TABS: 1.0000 | ORAL_TABLET | Freq: Four times a day (QID) | ORAL | 0 refills | Status: DC | PRN

## 2022-04-29 MED ORDER — GABAPENTIN 300 MG PO CAPS: 300.0000 mg | ORAL_CAPSULE | Freq: Three times a day (TID) | ORAL | 0 refills | Status: DC

## 2022-04-29 MED ORDER — ONDANSETRON 4 MG PO TBDP: 4.0000 mg | ORAL_TABLET | Freq: Three times a day (TID) | ORAL | 0 refills | Status: DC | PRN

## 2022-04-29 MED ORDER — HYDROMORPHONE HCL 2 MG PO TABS: 2.0000 mg | ORAL_TABLET | Freq: Four times a day (QID) | ORAL | 0 refills | Status: DC | PRN

## 2022-04-29 MED ORDER — RIVAROXABAN 10 MG PO TABS: 10.0000 mg | ORAL_TABLET | Freq: Every day | ORAL | 0 refills | Status: DC

## 2022-04-29 NOTE — Progress Notes (Addendum)
Pain medication changed to dilaudid d/t Norco and Percocet allergy, called patient, no answer but left VM  ?

## 2022-04-29 NOTE — Addendum Note (Signed)
Addended bySherryle Lis, Kareen Jefferys R on: 04/29/2022 02:14 PM ? ? Modules accepted: Orders ? ?

## 2022-04-29 NOTE — Progress Notes (Signed)
04/29/22 left Achilles repair/haglund's resection/gastroc ?

## 2022-05-02 ENCOUNTER — Telehealth: Payer: Self-pay | Admitting: *Deleted

## 2022-05-02 NOTE — Telephone Encounter (Signed)
Patient is having pain on right side of her ankle, meds not touching,just helps her to fall asleep. The swelling has gone down ,can wiggle her toes but trying not to do too much,.

## 2022-05-05 ENCOUNTER — Ambulatory Visit (INDEPENDENT_AMBULATORY_CARE_PROVIDER_SITE_OTHER): Payer: BC Managed Care – PPO

## 2022-05-05 ENCOUNTER — Ambulatory Visit (INDEPENDENT_AMBULATORY_CARE_PROVIDER_SITE_OTHER): Payer: BC Managed Care – PPO | Admitting: Podiatry

## 2022-05-05 DIAGNOSIS — M7662 Achilles tendinitis, left leg: Secondary | ICD-10-CM

## 2022-05-05 DIAGNOSIS — M9262 Juvenile osteochondrosis of tarsus, left ankle: Secondary | ICD-10-CM | POA: Diagnosis not present

## 2022-05-05 DIAGNOSIS — M21862 Other specified acquired deformities of left lower leg: Secondary | ICD-10-CM | POA: Diagnosis not present

## 2022-05-05 MED ORDER — GABAPENTIN 300 MG PO CAPS: 300.0000 mg | ORAL_CAPSULE | Freq: Three times a day (TID) | ORAL | 0 refills | Status: DC

## 2022-05-05 MED ORDER — HYDROMORPHONE HCL 2 MG PO TABS: 2.0000 mg | ORAL_TABLET | Freq: Four times a day (QID) | ORAL | 0 refills | Status: AC | PRN

## 2022-05-09 ENCOUNTER — Telehealth: Payer: Self-pay | Admitting: Podiatry

## 2022-05-09 ENCOUNTER — Encounter: Payer: Self-pay | Admitting: Podiatry

## 2022-05-09 NOTE — Progress Notes (Signed)
  Subjective:  Patient ID: Erin Collins, female    DOB: 1976/03/10,  MRN: [id number]  Chief Complaint  Patient presents with   Routine Post Op    POV #1 DOS 04/29/2022 REPAIR OF ACHILLES TENDON, CALF MUSLE LENGTHENING, REMOVAL OF EXTRA BONE/SPUR, POSSIBLE TENDON TRANSFER      46 y.o. female returns for post-op check.  She is doing well having a decent pain in the posterior heel  Review of Systems: Negative except as noted in the HPI. Denies N/V/F/Ch.   Objective:  There were no vitals filed for this visit. There is no height or weight on file to calculate BMI. Constitutional Well developed. Well nourished.  Vascular Foot warm and well perfused. Capillary refill normal to all digits.  Calf is soft and supple, no posterior calf or knee pain, no evidence of DVT  Neurologic Normal speech. Oriented to person, place, and time. Epicritic sensation to light touch grossly present bilaterally.  Dermatologic Cast is clean dry and intact, no evidence of cellulitis  Orthopedic: Tenderness to palpation noted about the surgical site.   Multiple view plain film radiographs: Interval resection of posterior and superior heel spurring and Haglund deformity Assessment:   1. Gastrocnemius equinus of left lower extremity   2. Haglund's deformity of left heel   3. Tendonitis, Achilles, left    Plan:  Patient was evaluated and treated and all questions answered.  S/p foot surgery left -Progressing as expected post-operatively. -XR: Noted above -WB Status: NWB in below-knee cast -Sutures: Plan remove with cast change next visit. -Medications: Refill of hydromorphone and gabapentin sent to pharmacy -Mostly heel pain she is having I think is due to direct pressure from elevating on pillows.  I discussed with her to float the heel in the air on pillows behind the calf.  This should help quite a bit.  Return in about 2 weeks (around 05/19/2022) for cast change, post op (no x-rays), suture removal.

## 2022-05-09 NOTE — Telephone Encounter (Signed)
Pt called stating she needed an appt for this week with you for cast change and suture removal . Your note says around 6.1.2023 but she thought it was to be this week. If you could let me know when you wanted her to come in. You are full this week.

## 2022-05-09 NOTE — Telephone Encounter (Signed)
Notified pt of what Dr Sherryle Lis said and she is already scheduled to follow up with him the next week, She said thank you.

## 2022-05-09 NOTE — Telephone Encounter (Signed)
Sutures will not be ready to be removed this week. If the cast is loose or uncomfortable we can change it, but another cast change and suture removal will be on 6/1

## 2022-05-11 ENCOUNTER — Other Ambulatory Visit: Payer: Self-pay | Admitting: Allergy

## 2022-05-17 ENCOUNTER — Telehealth: Payer: Self-pay | Admitting: *Deleted

## 2022-05-17 ENCOUNTER — Encounter: Payer: Self-pay | Admitting: Podiatry

## 2022-05-17 MED ORDER — HYDROMORPHONE HCL 2 MG PO TABS: 2.0000 mg | ORAL_TABLET | ORAL | 0 refills | Status: AC | PRN

## 2022-05-17 NOTE — Telephone Encounter (Signed)
Patient scheduled for tomorrow

## 2022-05-17 NOTE — Telephone Encounter (Signed)
Patient called and said that she fell and her surgery leg went under entire body, in a lot of pain and is out of pain medicine. She has an upcoming appointment on Thursday. Please advise.

## 2022-05-17 NOTE — Addendum Note (Signed)
Addended bySherryle Lis, Baltasar Twilley R on: 05/17/2022 05:34 PM   Modules accepted: Orders

## 2022-05-18 ENCOUNTER — Encounter: Payer: Self-pay | Admitting: Podiatry

## 2022-05-18 ENCOUNTER — Other Ambulatory Visit: Payer: Self-pay

## 2022-05-18 ENCOUNTER — Ambulatory Visit (INDEPENDENT_AMBULATORY_CARE_PROVIDER_SITE_OTHER): Payer: BC Managed Care – PPO

## 2022-05-18 ENCOUNTER — Ambulatory Visit (INDEPENDENT_AMBULATORY_CARE_PROVIDER_SITE_OTHER): Payer: BC Managed Care – PPO | Admitting: Podiatry

## 2022-05-18 DIAGNOSIS — M9262 Juvenile osteochondrosis of tarsus, left ankle: Secondary | ICD-10-CM

## 2022-05-18 DIAGNOSIS — Z9889 Other specified postprocedural states: Secondary | ICD-10-CM

## 2022-05-18 DIAGNOSIS — M21862 Other specified acquired deformities of left lower leg: Secondary | ICD-10-CM

## 2022-05-18 DIAGNOSIS — M7662 Achilles tendinitis, left leg: Secondary | ICD-10-CM

## 2022-05-18 DIAGNOSIS — M62462 Contracture of muscle, left lower leg: Secondary | ICD-10-CM

## 2022-05-18 MED ORDER — GABAPENTIN 300 MG PO CAPS: 300.0000 mg | ORAL_CAPSULE | Freq: Three times a day (TID) | ORAL | 0 refills | Status: DC

## 2022-05-18 MED ORDER — SUMATRIPTAN SUCCINATE 50 MG PO TABS: 50.0000 mg | ORAL_TABLET | Freq: Once | ORAL | 0 refills | Status: DC | PRN

## 2022-05-18 NOTE — Progress Notes (Signed)
  Subjective:  Patient ID: Erin Collins, female    DOB: 03-10-1976,  MRN: [id number]  Chief Complaint  Patient presents with   Post-op Problem    "I fell.  It's doing better than last night."      46 y.o. female returns for post-op check.  She had a slip and fall and landed on the cast.  Denies any other associated injury.  No loss of consciousness  Review of Systems: Negative except as noted in the HPI. Denies N/V/F/Ch.   Objective:  There were no vitals filed for this visit. There is no height or weight on file to calculate BMI. Constitutional Well developed. Well nourished.  Vascular Foot warm and well perfused. Capillary refill normal to all digits.  Calf is soft and supple, no posterior calf or knee pain, no evidence of DVT  Neurologic Normal speech. Oriented to person, place, and time. Epicritic sensation to light touch grossly present bilaterally.  Dermatologic Cast is clean dry and intact, incisions remain well coapted no signs of infection  Orthopedic: Tenderness to palpation noted about the surgical site.  She has increased edema.  Good continuity of tendon remains, 5 out of 5 plantarflexion strength no palpable defects   Multiple view plain film radiographs: No appreciable change since last films taken on 05/05/2022 Assessment:   1. Status post surgery   2. Gastrocnemius equinus of left lower extremity   3. Haglund's deformity of left heel   4. Tendonitis, Achilles, left    Plan:  Patient was evaluated and treated and all questions answered.  S/p foot surgery left -Fortunately has not had any notable complication due to the fall -XR: Noted above, no change since last visit -WB Status: NWB in below-knee cast -Sutures: All sutures removed today -Medications: Refill of hydromorphone and gabapentin sent to pharmacy -Well-padded below the knee cast was reapplied -Return in 3 weeks plan to transition to boot with plantarflexion wedges and begin PT at that  point  Return in about 22 days (around 06/09/2022) for cast removal and transition to boot.

## 2022-05-18 NOTE — Telephone Encounter (Signed)
SUMAtriptan (IMITREX) 50 MG tablet (Expired, refill request @ [id number]- [place], [place] - [address].

## 2022-05-19 ENCOUNTER — Encounter: Payer: BC Managed Care – PPO | Admitting: Podiatry

## 2022-05-21 ENCOUNTER — Encounter: Payer: Self-pay | Admitting: Podiatry

## 2022-05-30 ENCOUNTER — Telehealth: Payer: Self-pay | Admitting: *Deleted

## 2022-05-30 NOTE — Telephone Encounter (Signed)
Patient is calling because her foot is moving a little more than usual in the cast,still having a little pain but is wanting to know if she could come in sooner to remove the cast and transition into the boot

## 2022-05-31 ENCOUNTER — Telehealth: Payer: Self-pay | Admitting: Podiatry

## 2022-05-31 ENCOUNTER — Ambulatory Visit: Payer: BC Managed Care – PPO

## 2022-05-31 DIAGNOSIS — Z9889 Other specified postprocedural states: Secondary | ICD-10-CM | POA: Diagnosis not present

## 2022-05-31 NOTE — Telephone Encounter (Signed)
Pt called and the cast is very loose and she is afraid she will slip out of it before her appt next week. Should I add to nurse schedule for today?

## 2022-05-31 NOTE — Telephone Encounter (Signed)
Patient had foot recast today,doing great, there was a small amount of blood around the incision and bandage.

## 2022-05-31 NOTE — Progress Notes (Signed)
Patient in office today for cast change. Cast removed without complication and new well-padded below the knee cast was reapplied. All questions answered during the visit. Advised patient to call the office with any questions, comments, or concerns. Patient verbalized understanding.

## 2022-06-09 ENCOUNTER — Ambulatory Visit (INDEPENDENT_AMBULATORY_CARE_PROVIDER_SITE_OTHER): Payer: BC Managed Care – PPO | Admitting: Podiatry

## 2022-06-09 DIAGNOSIS — M9262 Juvenile osteochondrosis of tarsus, left ankle: Secondary | ICD-10-CM

## 2022-06-09 DIAGNOSIS — M21862 Other specified acquired deformities of left lower leg: Secondary | ICD-10-CM | POA: Diagnosis not present

## 2022-06-09 DIAGNOSIS — Z9889 Other specified postprocedural states: Secondary | ICD-10-CM

## 2022-06-09 DIAGNOSIS — M7662 Achilles tendinitis, left leg: Secondary | ICD-10-CM

## 2022-06-09 NOTE — Patient Instructions (Signed)
Call Phone: [phone] to schedule PT at [facility]

## 2022-06-10 ENCOUNTER — Telehealth: Payer: Self-pay | Admitting: *Deleted

## 2022-06-11 ENCOUNTER — Encounter: Payer: Self-pay | Admitting: *Deleted

## 2022-06-14 ENCOUNTER — Ambulatory Visit (HOSPITAL_BASED_OUTPATIENT_CLINIC_OR_DEPARTMENT_OTHER): Payer: BC Managed Care – PPO | Attending: Podiatry | Admitting: Physical Therapy

## 2022-06-14 ENCOUNTER — Encounter (HOSPITAL_BASED_OUTPATIENT_CLINIC_OR_DEPARTMENT_OTHER): Payer: Self-pay | Admitting: Physical Therapy

## 2022-06-14 DIAGNOSIS — M9262 Juvenile osteochondrosis of tarsus, left ankle: Secondary | ICD-10-CM | POA: Insufficient documentation

## 2022-06-14 DIAGNOSIS — M25672 Stiffness of left ankle, not elsewhere classified: Secondary | ICD-10-CM | POA: Insufficient documentation

## 2022-06-14 DIAGNOSIS — M21862 Other specified acquired deformities of left lower leg: Secondary | ICD-10-CM | POA: Insufficient documentation

## 2022-06-14 DIAGNOSIS — Z9889 Other specified postprocedural states: Secondary | ICD-10-CM | POA: Insufficient documentation

## 2022-06-14 DIAGNOSIS — R600 Localized edema: Secondary | ICD-10-CM | POA: Insufficient documentation

## 2022-06-14 DIAGNOSIS — R262 Difficulty in walking, not elsewhere classified: Secondary | ICD-10-CM | POA: Insufficient documentation

## 2022-06-14 DIAGNOSIS — M25572 Pain in left ankle and joints of left foot: Secondary | ICD-10-CM | POA: Insufficient documentation

## 2022-06-14 DIAGNOSIS — M7662 Achilles tendinitis, left leg: Secondary | ICD-10-CM | POA: Insufficient documentation

## 2022-06-19 ENCOUNTER — Telehealth: Payer: Self-pay | Admitting: Internal Medicine

## 2022-06-26 ENCOUNTER — Encounter: Payer: Self-pay | Admitting: Podiatry

## 2022-06-27 ENCOUNTER — Telehealth: Payer: Self-pay | Admitting: Podiatry

## 2022-06-27 MED ORDER — MUPIROCIN 2 % EX OINT: 1.0000 | TOPICAL_OINTMENT | Freq: Every day | CUTANEOUS | 0 refills | Status: DC

## 2022-06-27 NOTE — Telephone Encounter (Signed)
"  May 12th was my surgery date with Dr. Sherryle Lis.  I noticed yesterday that there was a stitch left the bottom of my heel.  I believe it popped.  There's a hole in my foot.  So, I just wanted to check in and see if I can get in and have him take a look at it.  I don't want it to get infected.  Right now I only have a band-aid over it  and don't know what else to do with it.  If you could, give me a call."

## 2022-06-28 NOTE — Telephone Encounter (Signed)
I left her a message to give [place] a call tomorrow morning to schedule an appointment with Dr. Sherryle Lis on tomorrow.  (Please call and schedule her an appointment to come in on tomorrow, 06/29/2022.)

## 2022-06-29 ENCOUNTER — Encounter (HOSPITAL_BASED_OUTPATIENT_CLINIC_OR_DEPARTMENT_OTHER): Payer: Self-pay | Admitting: Physical Therapy

## 2022-06-29 ENCOUNTER — Ambulatory Visit (HOSPITAL_BASED_OUTPATIENT_CLINIC_OR_DEPARTMENT_OTHER): Payer: BC Managed Care – PPO | Attending: Podiatry | Admitting: Physical Therapy

## 2022-06-29 ENCOUNTER — Ambulatory Visit (INDEPENDENT_AMBULATORY_CARE_PROVIDER_SITE_OTHER): Payer: BC Managed Care – PPO | Admitting: Podiatry

## 2022-06-29 DIAGNOSIS — R262 Difficulty in walking, not elsewhere classified: Secondary | ICD-10-CM | POA: Diagnosis not present

## 2022-06-29 DIAGNOSIS — M25572 Pain in left ankle and joints of left foot: Secondary | ICD-10-CM | POA: Diagnosis not present

## 2022-06-29 DIAGNOSIS — M25672 Stiffness of left ankle, not elsewhere classified: Secondary | ICD-10-CM | POA: Insufficient documentation

## 2022-06-29 DIAGNOSIS — R600 Localized edema: Secondary | ICD-10-CM | POA: Insufficient documentation

## 2022-06-29 DIAGNOSIS — T8189XA Other complications of procedures, not elsewhere classified, initial encounter: Secondary | ICD-10-CM

## 2022-06-29 NOTE — Therapy (Signed)
OUTPATIENT PHYSICAL THERAPY LOWER EXTREMITY TREATMENT   Patient Name: Erin Collins MRN: [id number] DOB:[birth year], 46 y.o., female Today's Date: 06/29/2022   PT End of Session - 06/29/22 1351     Visit Number 2    Number of Visits 25    Date for PT Re-Evaluation 09/09/22    Authorization Type BCBS    PT Start Time 1349              Past Medical History:  Diagnosis Date   Anemia    Angio-edema    Asthma    Basal cell carcinoma ~ 1999   collarbone   Blood clot in vein ~ 2008   "left breast"    Complication of anesthesia    "I have trouble waking up afterwards" (12/18/2017)   Diarrhea 04/01/2019   Eczema    Family history of adverse reaction to anesthesia    "mom has trouble waking up afterwards" (12/18/2017)   Fluttering heart    "comes and goes" (12/18/2017)   GERD (gastroesophageal reflux disease)    Headache    "weekly" (12/18/2017)   History of kidney stones    Recurrent upper respiratory infection (URI)    Seizures ([place]) 1980 X 1   "bland stare"   Torticollis    Vertigo    Past Surgical History:  Procedure Laterality Date   BASAL CELL CARCINOMA EXCISION Right ~ 1999   "collarbone"   DILATION AND CURETTAGE OF UTERUS  2005   LAPAROSCOPIC ENDOMETRIOSIS FULGURATION  2005   NASAL SEPTOPLASTY W/ TURBINOPLASTY Bilateral 2014   SINOSCOPY     TONSILLECTOMY  1980   TUBAL LIGATION Right 2005   TYMPANOSTOMY TUBE PLACEMENT     Patient Active Problem List   Diagnosis Date Noted   HSV infection 04/28/2022   Headache    Snoring    Insomnia 08/19/2021   Not well controlled moderate persistent asthma 04/16/2021   Sinusitis 10/08/2020   Osteoporosis 02/04/2020   Seasonal and perennial allergic rhinoconjunctivitis 12/31/2018   Other atopic dermatitis 11/28/2018   Multiple drug allergies 11/28/2018   Healthcare maintenance 05/06/2018    PCP: Starlyn Skeans, MD  REFERRING PROVIDER: Criselda Peaches, DPM  REFERRING DIAG:  [phone] (ICD-10-CM) - Gastrocnemius  equinus of left lower extremity  M92.62 (ICD-10-CM) - Haglund's deformity of left heel  M76.62 (ICD-10-CM) - Tendonitis, Achilles, left  Z98.890 (ICD-10-CM) - Status post surgery  Post op Achilles reconstruction and repair 04/29/22. Can be toe touch WB in boot until 06/27/22. Then can progress to full WB in boot with wedges. OK to come out of boot for exercises gradually  THERAPY DIAG:  Pain in left ankle and joints of left foot  Stiffness of left ankle, not elsewhere classified  Difficulty in walking, not elsewhere classified  Localized edema  Rationale for Evaluation and Treatment Rehabilitation  ONSET DATE: DOS 04/29/22  SUBJECTIVE:   SUBJECTIVE STATEMENT: Touching it is 10/10 pain, moving it is about 7/10. Noticing a lot of cramping in calf and foot.   PERTINENT HISTORY: Chronic h/o foot issues  PAIN:  Are you having pain? Yes: NPRS scale: over 10/10 Pain location: Lt post heel and medial maleolus Pain description: sharp, throbbing, bruised Aggravating factors: WB, supination Relieving factors: ice  PRECAUTIONS: Other: ankle WB  WEIGHT BEARING RESTRICTIONS  Can be toe touch WB in boot until 06/27/22. Then can progress to full WB in boot with wedges. OK to come out of boot for exercises gradually  FALLS:  Has patient fallen in  last 6 months? Yes. 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: Independent  PATIENT GOALS hiking (was walking 3-5 mi/day), bike rides (did 100 mile bike rides)   OBJECTIVE:    PATIENT SURVEYS:  FOTO 20  COGNITION:  Overall cognitive status: Within functional limits for tasks assessed     SENSATION: Some numbness on post heel  EDEMA:  Circumferential: Left 29 cm around maleoli; Right 26 cm    PALPATION: Minimal mobility through foot and ankle due to edema  LOWER EXTREMITY ROM:  Active/passive ROM Left eval LEFT 7/12  Ankle dorsiflexion -32/-25 -24  Ankle plantarflexion    Ankle inversion    Ankle eversion      (Blank rows = not tested)  LOWER EXTREMITY MMT: not tested at eval  MMT Right eval Left eval  Hip flexion    Hip extension    Hip abduction    Hip adduction    Hip internal rotation    Hip external rotation    Knee flexion    Knee extension    Ankle dorsiflexion    Ankle plantarflexion    Ankle inversion    Ankle eversion     (Blank rows = not tested)   GAIT: In boot using RW, not placing foot on floor today    TODAY'S TREATMENT: 06/29/22: Manual: edma mob through left foot & ankle, passive ROM to ankle with talocrural distraction, mobilization through all 5 digits.  Seated heel press down with knee at 90 Towel slides for DF mobs Towel scrunches GAIT: progressed WB with crutches Standing hip ext & in turnout at bar   EVAL: SLR Active hamstring stretch Sidelying hip abd & clam Elevated ankle pumps & toe curls Gait training- toe touch with walker  Also fit bil axillary crutches today   PATIENT EDUCATION:  Education details: Anatomy of condition, POC, HEP, exercise form/rationale  Person educated: Patient Education method: Explanation, Demonstration, Tactile cues, Verbal cues, and Handouts Education comprehension: verbalized understanding, returned demonstration, verbal cues required, tactile cues required, and needs further education   HOME EXERCISE PROGRAM: [id number]- prefers digital  ASSESSMENT:  CLINICAL IMPRESSION: Improved motion following manual therapy- pt will begin doing more massage at home for edema mobility and desensitization. Requested she come out of her boot more often to put her foot on the floor and start putting her foot in a tennis shoe when seated.    OBJECTIVE IMPAIRMENTS Abnormal gait, decreased activity tolerance, decreased mobility, difficulty walking, decreased ROM, decreased strength, increased edema, increased muscle spasms, impaired sensation, improper body mechanics, postural dysfunction, and pain.   ACTIVITY LIMITATIONS  carrying, standing, squatting, stairs, transfers, bathing, dressing, locomotion level, and caring for others  PARTICIPATION LIMITATIONS: meal prep, cleaning, laundry, driving, community activity, and occupation  PERSONAL FACTORS  asthma, osteoporosis  are also affecting patient's functional outcome.   REHAB POTENTIAL: Good  CLINICAL DECISION MAKING: Stable/uncomplicated  EVALUATION COMPLEXITY: Low   GOALS: Goals reviewed with patient? Yes  SHORT TERM GOALS: Target date: 07/05/2022  Ankle DF to -10 or better Baseline: Goal status: INITIAL  2.  Demo proper gait pattern in alignment with WB precautions Baseline:  Goal status: INITIAL  3.  Resting pain <=7/10 Baseline:  Goal status: INITIAL   LONG TERM GOALS: Target date: 09/06/2022   Pt will demo ability to perform at least 5 single leg heel raises with UE support for balance Baseline:  Goal status: INITIAL  2.  Pt will return to biking Baseline:  Goal status: INITIAL  3.  Pt will return to hiking on trails with moderate difficulty Baseline:  Goal status: INITIAL  4.  Pt to meet stated FOTO goal Baseline:  Goal status: INITIAL  5.  Gross pain with ADLs <=3/10 Baseline:  Goal status: INITIAL  6.  Demo normalized gait pattern Baseline:  Goal status: INITIAL   PLAN: PT FREQUENCY: 1-2x/week  PT DURATION: 12 weeks  PLANNED INTERVENTIONS: Therapeutic exercises, Therapeutic activity, Neuromuscular re-education, Balance training, Gait training, Patient/Family education, Joint mobilization, Stair training, Aquatic Therapy, Dry Needling, Spinal mobilization, Cryotherapy, Moist heat, Taping, Vasopneumatic device, Manual therapy, and Re-evaluation  PLAN FOR NEXT SESSION: continue ROM and progress WB as appropriate  Josten Warmuth C. Layal Javid PT, DPT 06/29/22 1:51 PM

## 2022-06-30 ENCOUNTER — Institutional Professional Consult (permissible substitution): Payer: BC Managed Care – PPO | Admitting: Neurology

## 2022-07-02 ENCOUNTER — Encounter: Payer: Self-pay | Admitting: Podiatry

## 2022-07-02 NOTE — Progress Notes (Signed)
  Subjective:  Patient ID: Erin Collins, female    DOB: 03-01-1976,  MRN: [id number]  Chief Complaint  Patient presents with   Post-op Problem    "Very sore but it's looking a lot better."      46 y.o. female returns for post-op check.  She returns today with concern that part of the incision is not healing well  Review of Systems: Negative except as noted in the HPI. Denies N/V/F/Ch.   Objective:  There were no vitals filed for this visit. There is no height or weight on file to calculate BMI. Constitutional Well developed. Well nourished.  Vascular Foot warm and well perfused. Capillary refill normal to all digits.  Calf is soft and supple, no posterior calf or knee pain, no evidence of DVT  Neurologic Normal speech. Oriented to person, place, and time. Epicritic sensation to light touch grossly present bilaterally.  Dermatologic Proximal incision is well-healed, there is a small area of delayed healing distally  Orthopedic: Minimal tenderness to palpation there is sensitivity along the incision.  Edema has improved.  Good strength of the tendon     Multiple view plain film radiographs: No appreciable change since last films taken on 05/05/2022 Assessment:   1. Delayed surgical wound healing, initial encounter     Plan:  Patient was evaluated and treated and all questions answered.  S/p foot surgery left -She does have a small area of delayed healing remainder of the incision is well-healed and not hypertrophic.  She is applying mupirocin ointment and will continue this until it heals with a small bandage.  I will see her at her scheduled appointment.  Continue physical therapy and weightbearing as tolerated she will remove the remaining wedges next week  No follow-ups on file.

## 2022-07-04 NOTE — Telephone Encounter (Signed)
Patient sent message via my chart to contact the office to schedule an appointment.

## 2022-07-05 ENCOUNTER — Ambulatory Visit (HOSPITAL_BASED_OUTPATIENT_CLINIC_OR_DEPARTMENT_OTHER): Payer: BC Managed Care – PPO | Admitting: Physical Therapy

## 2022-07-05 ENCOUNTER — Encounter (HOSPITAL_BASED_OUTPATIENT_CLINIC_OR_DEPARTMENT_OTHER): Payer: Self-pay | Admitting: Physical Therapy

## 2022-07-05 DIAGNOSIS — R262 Difficulty in walking, not elsewhere classified: Secondary | ICD-10-CM | POA: Diagnosis not present

## 2022-07-05 DIAGNOSIS — R600 Localized edema: Secondary | ICD-10-CM

## 2022-07-05 DIAGNOSIS — M25572 Pain in left ankle and joints of left foot: Secondary | ICD-10-CM

## 2022-07-05 DIAGNOSIS — M25672 Stiffness of left ankle, not elsewhere classified: Secondary | ICD-10-CM | POA: Diagnosis not present

## 2022-07-05 NOTE — Therapy (Signed)
OUTPATIENT PHYSICAL THERAPY LOWER EXTREMITY TREATMENT   Patient Name: Erin Collins MRN: [id number] DOB:May 09, 1976, 46 y.o., female Today's Date: 07/05/2022   PT End of Session - 07/05/22 0848     Visit Number 3    Number of Visits 25    Date for PT Re-Evaluation 09/09/22    Authorization Type BCBS    PT Start Time [phone]    PT Stop Time 0945    PT Time Calculation (min) 59 min    Activity Tolerance Patient tolerated treatment well    Behavior During Therapy [facility] for tasks assessed/performed              Past Medical History:  Diagnosis Date   Anemia    Angio-edema    Asthma    Basal cell carcinoma ~ 1999   collarbone   Blood clot in vein ~ 2008   "left breast"    Complication of anesthesia    "I have trouble waking up afterwards" (12/18/2017)   Diarrhea 04/01/2019   Eczema    Family history of adverse reaction to anesthesia    "mom has trouble waking up afterwards" (12/18/2017)   Fluttering heart    "comes and goes" (12/18/2017)   GERD (gastroesophageal reflux disease)    Headache    "weekly" (12/18/2017)   History of kidney stones    Recurrent upper respiratory infection (URI)    Seizures ([place]) 1980 X 1   "bland stare"   Torticollis    Vertigo    Past Surgical History:  Procedure Laterality Date   BASAL CELL CARCINOMA EXCISION Right ~ 1999   "collarbone"   DILATION AND CURETTAGE OF UTERUS  2005   LAPAROSCOPIC ENDOMETRIOSIS FULGURATION  2005   NASAL SEPTOPLASTY W/ TURBINOPLASTY Bilateral 2014   [place]   TUBAL LIGATION Right 2005   TYMPANOSTOMY TUBE PLACEMENT     Patient Active Problem List   Diagnosis Date Noted   HSV infection 04/28/2022   Headache    Snoring    Insomnia 08/19/2021   Not well controlled moderate persistent asthma 04/16/2021   Sinusitis 10/08/2020   Osteoporosis 02/04/2020   Seasonal and perennial allergic rhinoconjunctivitis 12/31/2018   Other atopic dermatitis 11/28/2018   Multiple drug allergies  11/28/2018   Healthcare maintenance 05/06/2018    PCP: Starlyn Skeans, MD  REFERRING PROVIDER: Criselda Peaches, DPM  REFERRING DIAG:  [phone] (ICD-10-CM) - Gastrocnemius equinus of left lower extremity  M92.62 (ICD-10-CM) - Haglund's deformity of left heel  M76.62 (ICD-10-CM) - Tendonitis, Achilles, left  Z98.890 (ICD-10-CM) - Status post surgery  Post op Achilles reconstruction and repair 04/29/22. Can be toe touch WB in boot until 06/27/22. Then can progress to full WB in boot with wedges. OK to come out of boot for exercises gradually  THERAPY DIAG:  Pain in left ankle and joints of left foot  Stiffness of left ankle, not elsewhere classified  Difficulty in walking, not elsewhere classified  Localized edema  Rationale for Evaluation and Treatment Rehabilitation  ONSET DATE: DOS 04/29/22  SUBJECTIVE:   SUBJECTIVE STATEMENT: Notice I get more flex when I massage it. Supposed to take the last wedges out on thurs. Still unable toget heel to wedges now.   PERTINENT HISTORY: Chronic h/o foot issues  PAIN:  Are you having pain? Yes: NPRS scale: over 5-7/10 Pain location: Lt post heel and medial maleolus Pain description: sharp, throbbing, bruised Aggravating factors: WB, supination Relieving factors: ice  PRECAUTIONS: Other: ankle  WB  WEIGHT BEARING RESTRICTIONS  Can be toe touch WB in boot until 06/27/22. Then can progress to full WB in boot with wedges. OK to come out of boot for exercises gradually  FALLS:  Has patient fallen in last 6 months? Yes. 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: Independent  PATIENT GOALS hiking (was walking 3-5 mi/day), bike rides (did 100 mile bike rides)   OBJECTIVE:    PATIENT SURVEYS:  FOTO 20  COGNITION:  Overall cognitive status: Within functional limits for tasks assessed     SENSATION: Some numbness on post heel  EDEMA:  Circumferential: Left 29 cm around maleoli; Right 26 cm    PALPATION: Minimal  mobility through foot and ankle due to edema  LOWER EXTREMITY ROM:  Active/passive ROM Left eval LEFT 7/12 LEFT 7/18  Ankle dorsiflexion -32/-25 -24 -16  Ankle plantarflexion     Ankle inversion     Ankle eversion      (Blank rows = not tested)  LOWER EXTREMITY MMT: not tested at eval  MMT Right eval Left eval  Hip flexion    Hip extension    Hip abduction    Hip adduction    Hip internal rotation    Hip external rotation    Knee flexion    Knee extension    Ankle dorsiflexion    Ankle plantarflexion    Ankle inversion    Ankle eversion     (Blank rows = not tested)   GAIT: In boot using RW, not placing foot on floor today    TODAY'S TREATMENT: 07/05/22: MANUAL: edema mob around ankle, STM to plantar aspect of foot Standing weight shift to foot- walker for UE support  Tandem fwd weight shift Lateral weight shift with 2" step under Rt foot Rt foot on step- Lt LE traction via gravity Seated stretch: HS, adductors, abductors VASO ankle 15 min, low pressure 34 deg   06/29/22: Manual: edma mob through left foot & ankle, passive ROM to ankle with talocrural distraction, mobilization through all 5 digits.  Seated heel press down with knee at 90 Towel slides for DF mobs Towel scrunches GAIT: progressed WB with crutches Standing hip ext & in turnout at bar   EVAL: SLR Active hamstring stretch Sidelying hip abd & clam Elevated ankle pumps & toe curls Gait training- toe touch with walker  Also fit bil axillary crutches today   PATIENT EDUCATION:  Education details: Anatomy of condition, POC, HEP, exercise form/rationale  Person educated: Patient Education method: Explanation, Demonstration, Tactile cues, Verbal cues, and Handouts Education comprehension: verbalized understanding, returned demonstration, verbal cues required, tactile cues required, and needs further education   HOME EXERCISE PROGRAM: [email or web address]- prefers digital  ASSESSMENT:  CLINICAL  IMPRESSION: Continued improvement in DF motion. Reports she is doing WB through the day. Will work on keeping leg in extension more through the day to reduce fluid trapping in flexed knee.     OBJECTIVE IMPAIRMENTS Abnormal gait, decreased activity tolerance, decreased mobility, difficulty walking, decreased ROM, decreased strength, increased edema, increased muscle spasms, impaired sensation, improper body mechanics, postural dysfunction, and pain.   ACTIVITY LIMITATIONS carrying, standing, squatting, stairs, transfers, bathing, dressing, locomotion level, and caring for others  PARTICIPATION LIMITATIONS: meal prep, cleaning, laundry, driving, community activity, and occupation  PERSONAL FACTORS  asthma, osteoporosis  are also affecting patient's functional outcome.   REHAB POTENTIAL: Good  CLINICAL DECISION MAKING: Stable/uncomplicated  EVALUATION COMPLEXITY: Low   GOALS: Goals reviewed with patient?  Yes  SHORT TERM GOALS: Target date: 07/05/2022  Ankle DF to -10 or better Baseline: Goal status: ongoing  2.  Demo proper gait pattern in alignment with WB precautions Baseline:  Goal status: achieved  3.  Resting pain <=7/10 Baseline:  Goal status: achieved   LONG TERM GOALS: Target date: 09/06/2022   Pt will demo ability to perform at least 5 single leg heel raises with UE support for balance Baseline:  Goal status: INITIAL  2.  Pt will return to biking Baseline:  Goal status: INITIAL  3.  Pt will return to hiking on trails with moderate difficulty Baseline:  Goal status: INITIAL  4.  Pt to meet stated FOTO goal Baseline:  Goal status: INITIAL  5.  Gross pain with ADLs <=3/10 Baseline:  Goal status: INITIAL  6.  Demo normalized gait pattern Baseline:  Goal status: INITIAL   PLAN: PT FREQUENCY: 1-2x/week  PT DURATION: 12 weeks  PLANNED INTERVENTIONS: Therapeutic exercises, Therapeutic activity, Neuromuscular re-education, Balance training, Gait  training, Patient/Family education, Joint mobilization, Stair training, Aquatic Therapy, Dry Needling, Spinal mobilization, Cryotherapy, Moist heat, Taping, Vasopneumatic device, Manual therapy, and Re-evaluation  PLAN FOR NEXT SESSION: continue ROM and progress WB in boot  Anhad Sheeley C. Mahli Glahn PT, DPT 07/05/22 9:37 AM

## 2022-07-07 ENCOUNTER — Ambulatory Visit: Payer: BC Managed Care – PPO | Admitting: Allergy

## 2022-07-12 ENCOUNTER — Encounter (HOSPITAL_BASED_OUTPATIENT_CLINIC_OR_DEPARTMENT_OTHER): Payer: Self-pay | Admitting: Physical Therapy

## 2022-07-12 ENCOUNTER — Ambulatory Visit (HOSPITAL_BASED_OUTPATIENT_CLINIC_OR_DEPARTMENT_OTHER): Payer: BC Managed Care – PPO | Admitting: Physical Therapy

## 2022-07-12 DIAGNOSIS — R262 Difficulty in walking, not elsewhere classified: Secondary | ICD-10-CM | POA: Diagnosis not present

## 2022-07-12 DIAGNOSIS — M25672 Stiffness of left ankle, not elsewhere classified: Secondary | ICD-10-CM | POA: Diagnosis not present

## 2022-07-12 DIAGNOSIS — M25572 Pain in left ankle and joints of left foot: Secondary | ICD-10-CM | POA: Diagnosis not present

## 2022-07-12 DIAGNOSIS — R600 Localized edema: Secondary | ICD-10-CM | POA: Diagnosis not present

## 2022-07-12 NOTE — Therapy (Signed)
OUTPATIENT PHYSICAL THERAPY LOWER EXTREMITY TREATMENT   Patient Name: Erin Collins MRN: [id number] DOB:1976-05-24, 46 y.o., female Today's Date: 07/13/2022   PT End of Session - 07/12/22 1520     Visit Number 4    Number of Visits 25    Date for PT Re-Evaluation 09/09/22    Authorization Type BCBS    PT Start Time 1519    PT Stop Time 1615    PT Time Calculation (min) 56 min    Activity Tolerance Patient tolerated treatment well    Behavior During Therapy WFL for tasks assessed/performed              Past Medical History:  Diagnosis Date   Anemia    Angio-edema    Asthma    Basal cell carcinoma ~ 1999   collarbone   Blood clot in vein ~ 2008   "left breast"    Complication of anesthesia    "I have trouble waking up afterwards" (12/18/2017)   Diarrhea 04/01/2019   Eczema    Family history of adverse reaction to anesthesia    "mom has trouble waking up afterwards" (12/18/2017)   Fluttering heart    "comes and goes" (12/18/2017)   GERD (gastroesophageal reflux disease)    Headache    "weekly" (12/18/2017)   History of kidney stones    Recurrent upper respiratory infection (URI)    Seizures ([place]) 1980 X 1   "bland stare"   Torticollis    Vertigo    Past Surgical History:  Procedure Laterality Date   BASAL CELL CARCINOMA EXCISION Right ~ 1999   "collarbone"   DILATION AND CURETTAGE OF UTERUS  2005   LAPAROSCOPIC ENDOMETRIOSIS FULGURATION  2005   NASAL SEPTOPLASTY W/ TURBINOPLASTY Bilateral 2014   SINOSCOPY     TONSILLECTOMY  1980   TUBAL LIGATION Right 2005   TYMPANOSTOMY TUBE PLACEMENT     Patient Active Problem List   Diagnosis Date Noted   HSV infection 04/28/2022   Headache    Snoring    Insomnia 08/19/2021   Not well controlled moderate persistent asthma 04/16/2021   Sinusitis 10/08/2020   Osteoporosis 02/04/2020   Seasonal and perennial allergic rhinoconjunctivitis 12/31/2018   Other atopic dermatitis 11/28/2018   Multiple drug allergies  11/28/2018   Healthcare maintenance 05/06/2018    PCP: Starlyn Skeans, MD  REFERRING PROVIDER: Criselda Peaches, DPM  REFERRING DIAG:  [phone] (ICD-10-CM) - Gastrocnemius equinus of left lower extremity  M92.62 (ICD-10-CM) - Haglund's deformity of left heel  M76.62 (ICD-10-CM) - Tendonitis, Achilles, left  Z98.890 (ICD-10-CM) - Status post surgery  Post op Achilles reconstruction and repair 04/29/22. Can be toe touch WB in boot until 06/27/22. Then can progress to full WB in boot with wedges. OK to come out of boot for exercises gradually  THERAPY DIAG:  Pain in left ankle and joints of left foot  Stiffness of left ankle, not elsewhere classified  Difficulty in walking, not elsewhere classified  Localized edema  Rationale for Evaluation and Treatment Rehabilitation  ONSET DATE: DOS 04/29/22  SUBJECTIVE:   SUBJECTIVE STATEMENT: Still numb in heel- if I put too much pressure too fast I feel knives through the bottom of my foot.   PERTINENT HISTORY: Chronic h/o foot issues  PAIN:  Are you having pain? Yes: NPRS scale: none/10 Pain location: Lt post heel and medial maleolus Pain description: sharp, throbbing, bruised Aggravating factors: WB, supination Relieving factors: ice  PRECAUTIONS: Other: ankle WB  WEIGHT BEARING RESTRICTIONS  Can be toe touch WB in boot until 06/27/22. Then can progress to full WB in boot with wedges. OK to come out of boot for exercises gradually  FALLS:  Has patient fallen in last 6 months? Yes. 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: Independent  PATIENT GOALS hiking (was walking 3-5 mi/day), bike rides (did 100 mile bike rides)   OBJECTIVE:    PATIENT SURVEYS:  FOTO 20  COGNITION:  Overall cognitive status: Within functional limits for tasks assessed     SENSATION: Some numbness on post heel  EDEMA:  Circumferential: Left 29 cm around maleoli; Right 26 cm    PALPATION: Minimal mobility through foot and ankle due  to edema  LOWER EXTREMITY ROM:  Active/passive ROM Left eval LEFT 7/12 LEFT 7/18 LEFT 7/25  Ankle dorsiflexion -32/-25 -24 -16 -17/-10  Ankle plantarflexion      Ankle inversion      Ankle eversion       (Blank rows = not tested)  LOWER EXTREMITY MMT: not tested at eval  MMT Right eval Left eval  Hip flexion    Hip extension    Hip abduction    Hip adduction    Hip internal rotation    Hip external rotation    Knee flexion    Knee extension    Ankle dorsiflexion    Ankle plantarflexion    Ankle inversion    Ankle eversion     (Blank rows = not tested)      TODAY'S TREATMENT: 7/25: MANUAL: edema mob around foot/ankle Elevated toe & ankle movement- prior to and after manual Standing with walker- pressing heel down toward floor  Also done with hand towel roll under heel for pressure into calcaneus Seated towel slide into DF VASO ankle 15 min, low pressure 34 deg  07/05/22: MANUAL: edema mob around ankle, STM to plantar aspect of foot Standing weight shift to foot- walker for UE support  Tandem fwd weight shift Lateral weight shift with 2" step under Rt foot Rt foot on step- Lt LE traction via gravity Seated stretch: HS, adductors, abductors VASO ankle 15 min, low pressure 34 deg     PATIENT EDUCATION:  Education details: Geophysicist/field seismologist of condition, POC, HEP, exercise form/rationale  Person educated: Patient Education method: Explanation, Demonstration, Tactile cues, Verbal cues, and Handouts Education comprehension: verbalized understanding, returned demonstration, verbal cues required, tactile cues required, and needs further education   HOME EXERCISE PROGRAM: [email or web address]- prefers digital  ASSESSMENT:  CLINICAL IMPRESSION: Pt is progressing well. Began pressing foot into floor without boot to improve awareness of foot position- advised of pain vs soreness in exericses.    OBJECTIVE IMPAIRMENTS Abnormal gait, decreased activity tolerance, decreased  mobility, difficulty walking, decreased ROM, decreased strength, increased edema, increased muscle spasms, impaired sensation, improper body mechanics, postural dysfunction, and pain.   ACTIVITY LIMITATIONS carrying, standing, squatting, stairs, transfers, bathing, dressing, locomotion level, and caring for others  PARTICIPATION LIMITATIONS: meal prep, cleaning, laundry, driving, community activity, and occupation  PERSONAL FACTORS  asthma, osteoporosis  are also affecting patient's functional outcome.   REHAB POTENTIAL: Good  CLINICAL DECISION MAKING: Stable/uncomplicated  EVALUATION COMPLEXITY: Low   GOALS: Goals reviewed with patient? Yes  SHORT TERM GOALS: Target date: 07/05/2022  Ankle DF to -10 or better Baseline: Goal status: achieved  2.  Demo proper gait pattern in alignment with WB precautions Baseline:  Goal status: achieved  3.  Resting pain <=7/10 Baseline:  Goal status: achieved  LONG TERM GOALS: Target date: 09/06/2022   Pt will demo ability to perform at least 5 single leg heel raises with UE support for balance Baseline:  Goal status: INITIAL  2.  Pt will return to biking Baseline:  Goal status: INITIAL  3.  Pt will return to hiking on trails with moderate difficulty Baseline:  Goal status: INITIAL  4.  Pt to meet stated FOTO goal Baseline:  Goal status: INITIAL  5.  Gross pain with ADLs <=3/10 Baseline:  Goal status: INITIAL  6.  Demo normalized gait pattern Baseline:  Goal status: INITIAL   PLAN: PT FREQUENCY: 1-2x/week  PT DURATION: 12 weeks  PLANNED INTERVENTIONS: Therapeutic exercises, Therapeutic activity, Neuromuscular re-education, Balance training, Gait training, Patient/Family education, Joint mobilization, Stair training, Aquatic Therapy, Dry Needling, Spinal mobilization, Cryotherapy, Moist heat, Taping, Vasopneumatic device, Manual therapy, and Re-evaluation  PLAN FOR NEXT SESSION: continue ROM and progress WB in  boot  Dafney Farler C. Deaven Barron PT, DPT 07/13/22 10:33 AM

## 2022-07-18 ENCOUNTER — Encounter (HOSPITAL_BASED_OUTPATIENT_CLINIC_OR_DEPARTMENT_OTHER): Payer: Self-pay | Admitting: Physical Therapy

## 2022-07-18 ENCOUNTER — Ambulatory Visit (HOSPITAL_BASED_OUTPATIENT_CLINIC_OR_DEPARTMENT_OTHER): Payer: BC Managed Care – PPO | Admitting: Physical Therapy

## 2022-07-18 DIAGNOSIS — M25672 Stiffness of left ankle, not elsewhere classified: Secondary | ICD-10-CM | POA: Diagnosis not present

## 2022-07-18 DIAGNOSIS — M25572 Pain in left ankle and joints of left foot: Secondary | ICD-10-CM

## 2022-07-18 DIAGNOSIS — R262 Difficulty in walking, not elsewhere classified: Secondary | ICD-10-CM

## 2022-07-18 DIAGNOSIS — R600 Localized edema: Secondary | ICD-10-CM

## 2022-07-18 NOTE — Therapy (Signed)
OUTPATIENT PHYSICAL THERAPY LOWER EXTREMITY TREATMENT   Patient Name: Erin Collins MRN: [id number] DOB:06-07-1976, 46 y.o., female Today's Date: 07/18/2022   PT End of Session - 07/18/22 1519     Visit Number 5    Number of Visits 25    Date for PT Re-Evaluation 09/09/22    Authorization Type BCBS    PT Start Time 1518    PT Stop Time 1558    PT Time Calculation (min) 40 min    Activity Tolerance Patient tolerated treatment well    Behavior During Therapy [facility] for tasks assessed/performed              Past Medical History:  Diagnosis Date   Anemia    Angio-edema    Asthma    Basal cell carcinoma ~ 1999   collarbone   Blood clot in vein ~ 2008   "left breast"    Complication of anesthesia    "I have trouble waking up afterwards" (12/18/2017)   Diarrhea 04/01/2019   Eczema    Family history of adverse reaction to anesthesia    "mom has trouble waking up afterwards" (12/18/2017)   Fluttering heart    "comes and goes" (12/18/2017)   GERD (gastroesophageal reflux disease)    Headache    "weekly" (12/18/2017)   History of kidney stones    Recurrent upper respiratory infection (URI)    Seizures ([place]) 1980 X 1   "bland stare"   Torticollis    Vertigo    Past Surgical History:  Procedure Laterality Date   BASAL CELL CARCINOMA EXCISION Right ~ 1999   "collarbone"   DILATION AND CURETTAGE OF UTERUS  2005   LAPAROSCOPIC ENDOMETRIOSIS FULGURATION  2005   NASAL SEPTOPLASTY W/ TURBINOPLASTY Bilateral 2014   SINOSCOPY     TONSILLECTOMY  1980   TUBAL LIGATION Right 2005   TYMPANOSTOMY TUBE PLACEMENT     Patient Active Problem List   Diagnosis Date Noted   HSV infection 04/28/2022   Headache    Snoring    Insomnia 08/19/2021   Not well controlled moderate persistent asthma 04/16/2021   Sinusitis 10/08/2020   Osteoporosis 02/04/2020   Seasonal and perennial allergic rhinoconjunctivitis 12/31/2018   Other atopic dermatitis 11/28/2018   Multiple drug allergies  11/28/2018   Healthcare maintenance 05/06/2018    PCP: Starlyn Skeans, MD  REFERRING PROVIDER: Criselda Peaches, DPM  REFERRING DIAG:  [phone] (ICD-10-CM) - Gastrocnemius equinus of left lower extremity  M92.62 (ICD-10-CM) - Haglund's deformity of left heel  M76.62 (ICD-10-CM) - Tendonitis, Achilles, left  Z98.890 (ICD-10-CM) - Status post surgery  Post op Achilles reconstruction and repair 04/29/22. Can be toe touch WB in boot until 06/27/22. Then can progress to full WB in boot with wedges. OK to come out of boot for exercises gradually  THERAPY DIAG:  Pain in left ankle and joints of left foot  Stiffness of left ankle, not elsewhere classified  Difficulty in walking, not elsewhere classified  Localized edema  Rationale for Evaluation and Treatment Rehabilitation  ONSET DATE: DOS 04/29/22  SUBJECTIVE:   SUBJECTIVE STATEMENT: It looks good. I walked laps heel-toe with 2 noodles this weekend.   PERTINENT HISTORY: Chronic h/o foot issues  PAIN:  Are you having pain? Yes: NPRS scale: none/10 Pain location: Lt post heel and medial maleolus Pain description: sharp, throbbing, bruised Aggravating factors: WB, supination Relieving factors: ice  PRECAUTIONS: Other: ankle WB  WEIGHT BEARING RESTRICTIONS  Can be toe touch WB in boot until  06/27/22. Then can progress to full WB in boot with wedges. OK to come out of boot for exercises gradually  FALLS:  Has patient fallen in last 6 months? Yes. 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: Independent  PATIENT GOALS hiking (was walking 3-5 mi/day), bike rides (did 100 mile bike rides)   OBJECTIVE:    PATIENT SURVEYS:  FOTO 20  COGNITION:  Overall cognitive status: Within functional limits for tasks assessed     SENSATION: Some numbness on post heel  EDEMA:  Circumferential: Left 29 cm around maleoli; Right 26 cm    PALPATION: Minimal mobility through foot and ankle due to edema  LOWER EXTREMITY  ROM:  Active/passive ROM Left eval LEFT 7/12 LEFT 7/18 LEFT 7/25 LEFT  Ankle dorsiflexion -32/-25 -24 -16 -17/-10 -6  Ankle plantarflexion       Ankle inversion       Ankle eversion        (Blank rows = not tested)  LOWER EXTREMITY MMT: not tested at eval   TODAY'S TREATMENT: 7/31: GAIT: with crutches, no boot- focusing on heel-toe pattern and flexibility Seated DF- reducing great toe extension MANUAL: edema mob, distraction & AP talocrural Seated 90/90 heel press down for DF mob   7/25: MANUAL: edema mob around foot/ankle Elevated toe & ankle movement- prior to and after manual Standing with walker- pressing heel down toward floor  Also done with hand towel roll under heel for pressure into calcaneus Seated towel slide into DF VASO ankle 15 min, low pressure 34 deg  07/05/22: MANUAL: edema mob around ankle, STM to plantar aspect of foot Standing weight shift to foot- walker for UE support  Tandem fwd weight shift Lateral weight shift with 2" step under Rt foot Rt foot on step- Lt LE traction via gravity Seated stretch: HS, adductors, abductors VASO ankle 15 min, low pressure 34 deg     PATIENT EDUCATION:  Education details: Geophysicist/field seismologist of condition, POC, HEP, exercise form/rationale  Person educated: Patient Education method: Explanation, Demonstration, Tactile cues, Verbal cues, and Handouts Education comprehension: verbalized understanding, returned demonstration, verbal cues required, tactile cues required, and needs further education   HOME EXERCISE PROGRAM: [email or web address]- prefers digital  ASSESSMENT:  CLINICAL IMPRESSION: Began ambulating with crutches and without boot. Seeing MD tomorrow and will ask about removing boot- still lacking DF where she is not appropriate to ambulate without crutches.   OBJECTIVE IMPAIRMENTS Abnormal gait, decreased activity tolerance, decreased mobility, difficulty walking, decreased ROM, decreased strength, increased edema,  increased muscle spasms, impaired sensation, improper body mechanics, postural dysfunction, and pain.   ACTIVITY LIMITATIONS carrying, standing, squatting, stairs, transfers, bathing, dressing, locomotion level, and caring for others  PARTICIPATION LIMITATIONS: meal prep, cleaning, laundry, driving, community activity, and occupation  PERSONAL FACTORS  asthma, osteoporosis  are also affecting patient's functional outcome.   REHAB POTENTIAL: Good  CLINICAL DECISION MAKING: Stable/uncomplicated  EVALUATION COMPLEXITY: Low   GOALS: Goals reviewed with patient? Yes  SHORT TERM GOALS: Target date: 07/05/2022  Ankle DF to -10 or better Baseline: Goal status: achieved  2.  Demo proper gait pattern in alignment with WB precautions Baseline:  Goal status: achieved  3.  Resting pain <=7/10 Baseline:  Goal status: achieved   LONG TERM GOALS: Target date: 09/06/2022   Pt will demo ability to perform at least 5 single leg heel raises with UE support for balance Baseline:  Goal status: INITIAL  2.  Pt will return to biking Baseline:  Goal status:  INITIAL  3.  Pt will return to hiking on trails with moderate difficulty Baseline:  Goal status: INITIAL  4.  Pt to meet stated FOTO goal Baseline:  Goal status: INITIAL  5.  Gross pain with ADLs <=3/10 Baseline:  Goal status: INITIAL  6.  Demo normalized gait pattern Baseline:  Goal status: INITIAL   PLAN: PT FREQUENCY: 1-2x/week  PT DURATION: 12 weeks  PLANNED INTERVENTIONS: Therapeutic exercises, Therapeutic activity, Neuromuscular re-education, Balance training, Gait training, Patient/Family education, Joint mobilization, Stair training, Aquatic Therapy, Dry Needling, Spinal mobilization, Cryotherapy, Moist heat, Taping, Vasopneumatic device, Manual therapy, and Re-evaluation  PLAN FOR NEXT SESSION: new precautions from MD?  Envy Meno C. Zhana Jeangilles PT, DPT 07/18/22 3:59 PM

## 2022-07-19 ENCOUNTER — Ambulatory Visit (INDEPENDENT_AMBULATORY_CARE_PROVIDER_SITE_OTHER): Payer: BC Managed Care – PPO | Admitting: Podiatry

## 2022-07-19 DIAGNOSIS — M9262 Juvenile osteochondrosis of tarsus, left ankle: Secondary | ICD-10-CM

## 2022-07-19 DIAGNOSIS — M21862 Other specified acquired deformities of left lower leg: Secondary | ICD-10-CM

## 2022-07-19 DIAGNOSIS — T8189XA Other complications of procedures, not elsewhere classified, initial encounter: Secondary | ICD-10-CM

## 2022-07-19 DIAGNOSIS — M7662 Achilles tendinitis, left leg: Secondary | ICD-10-CM

## 2022-07-20 ENCOUNTER — Ambulatory Visit (INDEPENDENT_AMBULATORY_CARE_PROVIDER_SITE_OTHER): Payer: BC Managed Care – PPO | Admitting: Student

## 2022-07-20 DIAGNOSIS — R3989 Other symptoms and signs involving the genitourinary system: Secondary | ICD-10-CM | POA: Diagnosis not present

## 2022-07-20 DIAGNOSIS — J019 Acute sinusitis, unspecified: Secondary | ICD-10-CM | POA: Diagnosis not present

## 2022-07-20 NOTE — Assessment & Plan Note (Addendum)
She began experiencing rhinorrhea, congestion, and productive cough about four days ago. These symptoms have worsened since yesterday. She has tried her usual asthma and sinusitis medications, which have not really helped. She has been taking her albuterol frequently throughout the past few days. At-home COVID test was negative this morning. Lack of fever, fatigue, and myalgia suggest localized upper respiratory track infection. Asthma is likely a contributing factor, though she is not currently wheezing. These symptoms are consistent with an uncomplicated upper respiratory infection.  - Continue daily respiratory medication regimen as prescribed including albuterol as needed - Suggest home COVID test and contacting us for Paxlovid prescription if result is positive - Encourage adequate hydration and closely monitor progression of symptoms - Visit [facility], or the ED, if symptoms worsen or breathing becomes difficult

## 2022-07-20 NOTE — Progress Notes (Deleted)
   CC: ***  HPI:   [name] is a 46 y.o. ***   Symptom onset: Four days ago and worsened yesterday Nose has a lot of stuff, rhinorrhea, clear Coughing up phlem, thick yellow stringy mucus Chills at nighttime Congestion worse in morning and night Taken temperature, 97.1 No blood, SOB, sore throat, nausea, vomiting, or diarrhea, fever, discharge from eyes or ears, myalgia, muscle soreness, wheezing "Croupy" cough   Have tried usual medications, not really making any difference, albuterol using frequently throughout the day   Negative COVID test this morning at home    Fever, chills, myalgia, fatigue, rhinorrhea, congestion, cough, productive?, wheezing, SOB, chest pain, discharge from nose or ears, sore throat    Past Medical History:  Diagnosis Date   Anemia    Angio-edema    Asthma    Basal cell carcinoma ~ 1999   collarbone   Blood clot in vein ~ 2008   "left breast"    Complication of anesthesia    "I have trouble waking up afterwards" (12/18/2017)   Diarrhea 04/01/2019   Eczema    Family history of adverse reaction to anesthesia    "mom has trouble waking up afterwards" (12/18/2017)   Fluttering heart    "comes and goes" (12/18/2017)   GERD (gastroesophageal reflux disease)    Headache    "weekly" (12/18/2017)   History of kidney stones    Recurrent upper respiratory infection (URI)    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  General:   awake and alert, sitting comfortably in chair, cooperative, not in acute distress Skin:   warm and dry, intact without any obvious lesions or scars, no rashes or lesions  Head:   normocephalic and atraumatic, oral mucosa moist with good dentition, no lymphadenopathy Eyes:   extraocular movements intact, conjunctivae  pink, pupils round and reactive to light, no periorbital swelling or scleral icterus Ears:   pinnae normal, no discharge or external lesions  Nose:   symmetrical and without mucosal inflammation, no external lesions or discharge Lungs:   normal respiratory effort, breathing unlabored, symmetrical chest rise, no crackles or wheezing Cardiac:   regular rate and rhythm, normal S1 and S2, capillary refill 2-3 seconds, dorsalis pedis pulses intact bilaterally, no pitting edema Abdomen:   soft and non-distended, normoactive bowel sounds present in all four quadrants, no guarding or palpable  Psychiatric:   mood and affect normal, intelligible speech    Assessment & Plan:   No problem-specific Assessment & Plan notes found for this encounter.     See Encounters Tab for problem based charting.  Patient {GC/GE:[id number]::"discussed with","seen with"} Dr. {NAMES:[id number]::"Guilloud","Hoffman","Mullen","Narendra","Williams","Vincent"}

## 2022-07-20 NOTE — Progress Notes (Signed)
  [facility] Telephone Encounter Continuity Care Appointment  HPI:  This telephone encounter was created for Ms. Azha Constantin Clair on 07/20/2022 for the following purpose/cc rhinorrhea and urine discoloration.     Past Medical History:  Past Medical History:  Diagnosis Date   Anemia    Angio-edema    Asthma    Basal cell carcinoma ~ 1999   collarbone   Blood clot in vein ~ 2008   "left breast"    Complication of anesthesia    "I have trouble waking up afterwards" (12/18/2017)   Diarrhea 04/01/2019   Eczema    Family history of adverse reaction to anesthesia    "mom has trouble waking up afterwards" (12/18/2017)   Fluttering heart    "comes and goes" (12/18/2017)   GERD (gastroesophageal reflux disease)    Headache    "weekly" (12/18/2017)   History of kidney stones    Recurrent upper respiratory infection (URI)    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  As always, pt is advised that if symptoms worsen or new symptoms arise, they should go to an urgent care facility or to to ER for further evaluation.   Consent and Medical Decision Making:  Patient seen with Dr. Angelia Mould This is a telephone encounter between Hermina Barters Ferner and [place] Nickel on 07/20/2022 for 78mn. The visit was conducted with the patient located at home and [facility]well [facility] The patient's identity was confirmed using their DOB and current address. The patient has consented to being evaluated through a telephone encounter and  understands the associated risks (an examination cannot be done and the patient may need to come in for an appointment) / benefits (allows the patient to remain at home, decreasing exposure to coronavirus). I personally spent 15 minutes on medical discussion.

## 2022-07-20 NOTE — Assessment & Plan Note (Addendum)
Noticed pink-tinted urine about one week ago. Urine color was normal since then until this morning when it was slightly pink. She denies concurrent symptoms including dysuria and is not currently on her menstrual cycle.  - Order urinalysis to determine RBC content and rule out infectious etiology

## 2022-07-22 ENCOUNTER — Encounter (HOSPITAL_BASED_OUTPATIENT_CLINIC_OR_DEPARTMENT_OTHER): Payer: Self-pay | Admitting: Physical Therapy

## 2022-07-22 ENCOUNTER — Ambulatory Visit (HOSPITAL_BASED_OUTPATIENT_CLINIC_OR_DEPARTMENT_OTHER): Payer: BC Managed Care – PPO | Attending: Podiatry | Admitting: Physical Therapy

## 2022-07-22 DIAGNOSIS — R600 Localized edema: Secondary | ICD-10-CM | POA: Diagnosis not present

## 2022-07-22 DIAGNOSIS — M25672 Stiffness of left ankle, not elsewhere classified: Secondary | ICD-10-CM | POA: Insufficient documentation

## 2022-07-22 DIAGNOSIS — M25572 Pain in left ankle and joints of left foot: Secondary | ICD-10-CM | POA: Diagnosis not present

## 2022-07-22 DIAGNOSIS — R262 Difficulty in walking, not elsewhere classified: Secondary | ICD-10-CM | POA: Diagnosis not present

## 2022-07-22 NOTE — Therapy (Signed)
OUTPATIENT PHYSICAL THERAPY LOWER EXTREMITY TREATMENT   Patient Name: Erin Collins MRN: [id number] DOB:20-Jun-1976, 46 y.o., female Today's Date: 07/22/2022   PT End of Session - 07/22/22 1510     Visit Number 6    Number of Visits 25    Date for PT Re-Evaluation 09/09/22    Authorization Type BCBS    PT Start Time 1511    PT Stop Time 1550    PT Time Calculation (min) 39 min    Activity Tolerance Patient tolerated treatment well    Behavior During Therapy WFL for tasks assessed/performed              Past Medical History:  Diagnosis Date   Anemia    Angio-edema    Asthma    Basal cell carcinoma ~ 1999   collarbone   Blood clot in vein ~ 2008   "left breast"    Complication of anesthesia    "I have trouble waking up afterwards" (12/18/2017)   Diarrhea 04/01/2019   Eczema    Family history of adverse reaction to anesthesia    "mom has trouble waking up afterwards" (12/18/2017)   Fluttering heart    "comes and goes" (12/18/2017)   GERD (gastroesophageal reflux disease)    Headache    "weekly" (12/18/2017)   History of kidney stones    Recurrent upper respiratory infection (URI)    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   "bland stare"   Torticollis    Vertigo    Past Surgical History:  Procedure Laterality Date   BASAL CELL CARCINOMA EXCISION Right ~ 1999   "collarbone"   DILATION AND CURETTAGE OF UTERUS  2005   LAPAROSCOPIC ENDOMETRIOSIS FULGURATION  2005   NASAL SEPTOPLASTY W/ TURBINOPLASTY Bilateral 2014   [place]   TUBAL LIGATION Right 2005   TYMPANOSTOMY TUBE PLACEMENT     Patient Active Problem List   Diagnosis Date Noted   Urine discoloration 07/20/2022   HSV infection 04/28/2022   Headache    Snoring    Insomnia 08/19/2021   Not well controlled moderate persistent asthma 04/16/2021   Sinusitis 10/08/2020   Osteoporosis 02/04/2020   Seasonal and perennial allergic rhinoconjunctivitis 12/31/2018   Other atopic dermatitis  11/28/2018   Multiple drug allergies 11/28/2018   Healthcare maintenance 05/06/2018    PCP: Starlyn Skeans, MD  REFERRING PROVIDER: Criselda Peaches, DPM  REFERRING DIAG:  [phone] (ICD-10-CM) - Gastrocnemius equinus of left lower extremity  M92.62 (ICD-10-CM) - Haglund's deformity of left heel  M76.62 (ICD-10-CM) - Tendonitis, Achilles, left  Z98.890 (ICD-10-CM) - Status post surgery  Post op Achilles reconstruction and repair 04/29/22. Can be toe touch WB in boot until 06/27/22. Then can progress to full WB in boot with wedges. OK to come out of boot for exercises gradually  THERAPY DIAG:  Pain in left ankle and joints of left foot  Stiffness of left ankle, not elsewhere classified  Difficulty in walking, not elsewhere classified  Localized edema  Rationale for Evaluation and Treatment Rehabilitation  ONSET DATE: DOS 04/29/22  SUBJECTIVE:   SUBJECTIVE STATEMENT: Pt reports she saw her MD on Wednesday; she is allowed to be out of boot and in shoe.  She arrives ambulating with bilat crutches and boot donned.    PERTINENT HISTORY: Chronic h/o foot issues  PAIN:  Are you having pain? Yes: NPRS scale: none/10 Pain location: Lt post heel and medial maleolus Pain description: sharp, throbbing, bruised Aggravating factors:  WB, supination Relieving factors: ice  PRECAUTIONS: Other: ankle WB  WEIGHT BEARING RESTRICTIONS  Can be toe touch WB in boot until 06/27/22. Then can progress to full WB in boot with wedges. OK to come out of boot for exercises gradually  FALLS:  Has patient fallen in last 6 months? Yes. 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: Independent  PATIENT GOALS hiking (was walking 3-5 mi/day), bike rides (did 100 mile bike rides)   OBJECTIVE:    PATIENT SURVEYS:  FOTO 20  COGNITION:  Overall cognitive status: Within functional limits for tasks assessed     SENSATION: Some numbness on post heel  EDEMA:  Circumferential: Left 29 cm around  maleoli; Right 26 cm    PALPATION: Minimal mobility through foot and ankle due to edema  LOWER EXTREMITY ROM:  Active/passive ROM Left eval LEFT 7/12 LEFT 7/18 LEFT 7/25 LEFT Left 8/4  Ankle dorsiflexion -32/-25 -24 -16 -17/-10 -6 -15  Ankle plantarflexion        Ankle inversion        Ankle eversion         (Blank rows = not tested)  LOWER EXTREMITY MMT: not tested at eval   TODAY'S TREATMENT: 8/4: Pt seen for aquatic therapy today.  Treatment took place in water 3.25-4 ft 3 in depth at the [facility] pool. Temp of water was 91.  Pt entered/exited the pool via stairs with step-to pattern with supervision with bilat rail.   *in water just above 4 ft - with support of two yellow noodles under arms:    - Weight shifts forward/ backward    -  forward / backward gait, cues for heel strike and upward posture (forward flexed in stance)   - side stepping    - high knee marching forward/ backward   - standing facing locker room doors for calf stretch on pool slope  * holding wall:     - squats allowing heels to come up   - Lt SLS   - ankle circles  * stairs:  Lt foot on 2nd step with forward lunge for DF stretch, range to tolerance, holding 10sec, multiple reps    Step to pattern (exiting pool) leading with LLE and heavy UE on rails  * after pt dried off:  sensitive skin Rock tape applied in stirrup pattern (under L arch and crossing at talus) - no stretch; I strips of sensitive skin Rock tape applied to incision at Lt calcaneus (2 pieces side by side) -10% stretch - all to help increase proprioception and decrease edema.  Pt instructed on safe removal technique.   Pt requires the buoyancy and hydrostatic pressure of water for support, and to offload joints by unweighting joint load by at least 50 % in navel deep water and by at least 75-80% in chest to neck deep water.  Viscosity of the water is needed for resistance of strengthening. Water current perturbations  provides challenge to standing balance requiring increased core activation.  7/31: GAIT: with crutches, no boot- focusing on heel-toe pattern and flexibility Seated DF- reducing great toe extension MANUAL: edema mob, distraction & AP talocrural Seated 90/90 heel press down for DF mob   7/25: MANUAL: edema mob around foot/ankle Elevated toe & ankle movement- prior to and after manual Standing with walker- pressing heel down toward floor  Also done with hand towel roll under heel for pressure into calcaneus Seated towel slide into DF VASO ankle 15 min, low  pressure 34 deg  07/05/22: MANUAL: edema mob around ankle, STM to plantar aspect of foot Standing weight shift to foot- walker for UE support  Tandem fwd weight shift Lateral weight shift with 2" step under Rt foot Rt foot on step- Lt LE traction via gravity Seated stretch: HS, adductors, abductors VASO ankle 15 min, low pressure 34 deg     PATIENT EDUCATION:  Education details: Geophysicist/field seismologist of condition, POC, HEP, exercise form/rationale  Person educated: Patient Education method: Consulting civil engineer, Demonstration, Tactile cues, Verbal cues, and Handouts Education comprehension: verbalized understanding, returned demonstration, verbal cues required, tactile cues required, and needs further education   HOME EXERCISE PROGRAM: [id number]- prefers digital  ASSESSMENT:  CLINICAL IMPRESSION: Pt with increased edema in Lt ankle and forefoot upon arrival.  Pt demonstrated decreased DF ROM from last measurement.  All exercises/gait completed without increase in pain. Cues required for more upright posture in stance phase with forward gait in 4+ ft of water.  Trial of ktape applied to assist with edema reduction and increased proprioception.  Progressing towards goals.    OBJECTIVE IMPAIRMENTS Abnormal gait, decreased activity tolerance, decreased mobility, difficulty walking, decreased ROM, decreased strength, increased edema, increased muscle  spasms, impaired sensation, improper body mechanics, postural dysfunction, and pain.   ACTIVITY LIMITATIONS carrying, standing, squatting, stairs, transfers, bathing, dressing, locomotion level, and caring for others  PARTICIPATION LIMITATIONS: meal prep, cleaning, laundry, driving, community activity, and occupation  PERSONAL FACTORS  asthma, osteoporosis  are also affecting patient's functional outcome.   REHAB POTENTIAL: Good  CLINICAL DECISION MAKING: Stable/uncomplicated  EVALUATION COMPLEXITY: Low   GOALS: Goals reviewed with patient? Yes  SHORT TERM GOALS: Target date: 07/05/2022  Ankle DF to -10 or better Baseline: Goal status: achieved  2.  Demo proper gait pattern in alignment with WB precautions Baseline:  Goal status: achieved  3.  Resting pain <=7/10 Baseline:  Goal status: achieved   LONG TERM GOALS: Target date: 09/06/2022   Pt will demo ability to perform at least 5 single leg heel raises with UE support for balance Baseline:  Goal status: INITIAL  2.  Pt will return to biking Baseline:  Goal status: INITIAL  3.  Pt will return to hiking on trails with moderate difficulty Baseline:  Goal status: INITIAL  4.  Pt to meet stated FOTO goal Baseline:  Goal status: INITIAL  5.  Gross pain with ADLs <=3/10 Baseline:  Goal status: INITIAL  6.  Demo normalized gait pattern Baseline:  Goal status: INITIAL   PLAN: PT FREQUENCY: 1-2x/week  PT DURATION: 12 weeks  PLANNED INTERVENTIONS: Therapeutic exercises, Therapeutic activity, Neuromuscular re-education, Balance training, Gait training, Patient/Family education, Joint mobilization, Stair training, Aquatic Therapy, Dry Needling, Spinal mobilization, Cryotherapy, Moist heat, Taping, Vasopneumatic device, Manual therapy, and Re-evaluation  PLAN FOR NEXT SESSION: new precautions from MD?.  Assess response to tape.  Kerin Perna, PTA 07/22/22 4:26 PM

## 2022-07-24 ENCOUNTER — Other Ambulatory Visit: Payer: Self-pay | Admitting: Student

## 2022-07-24 NOTE — Progress Notes (Signed)
  Subjective:  Patient ID: Erin Collins, female    DOB: [birth year],  MRN: [id number]  Chief Complaint  Patient presents with   Routine Post Op     POV #4 DOS 04/29/2022 REPAIR OF ACHILLES TENDON, CALF MUSLE LENGTHENING, REMOVAL OF EXTRA BONE/SPUR, POSSIBLE TENDON TRANSFER      46 y.o. female returns for post-op check.  She is doing well feels like it is turned a corner now  Review of Systems: Negative except as noted in the HPI. Denies N/V/F/Ch.   Objective:  There were no vitals filed for this visit. There is no height or weight on file to calculate BMI. Constitutional Well developed. Well nourished.  Vascular Foot warm and well perfused. Capillary refill normal to all digits.  Calf is soft and supple, no posterior calf or knee pain, no evidence of DVT  Neurologic Normal speech. Oriented to person, place, and time. Epicritic sensation to light touch grossly present bilaterally.  Dermatologic Incision is fully healed not hypertrophic  Orthopedic: Minimal tenderness to palpation there is sensitivity along the incision.  Edema has improved.  Good strength of the tendon     Multiple view plain film radiographs: No appreciable change since last films taken on 05/05/2022 Assessment:   1. Delayed surgical wound healing, initial encounter   2. Gastrocnemius equinus of left lower extremity   3. Haglund's deformity of left heel   4. Tendonitis, Achilles, left      Plan:  Patient was evaluated and treated and all questions answered.  S/p foot surgery left -Doing very well seems to have turned a corner and improved.  I recommend she continue physical therapy she may wear in her shoe gear and resume her normal activities.  I will see her back in 6 weeks for follow-up  No follow-ups on file.

## 2022-07-25 ENCOUNTER — Encounter (HOSPITAL_BASED_OUTPATIENT_CLINIC_OR_DEPARTMENT_OTHER): Payer: Self-pay | Admitting: Physical Therapy

## 2022-07-25 ENCOUNTER — Encounter: Payer: Self-pay | Admitting: Student

## 2022-07-25 ENCOUNTER — Ambulatory Visit (INDEPENDENT_AMBULATORY_CARE_PROVIDER_SITE_OTHER): Payer: BC Managed Care – PPO | Admitting: Student

## 2022-07-25 ENCOUNTER — Ambulatory Visit (HOSPITAL_COMMUNITY)
Admission: RE | Admit: 2022-07-25 | Discharge: 2022-07-25 | Disposition: A | Discharge status: Home / Self Care | Payer: BC Managed Care – PPO | Source: Ambulatory Visit | Attending: Internal Medicine | Admitting: Internal Medicine

## 2022-07-25 ENCOUNTER — Ambulatory Visit (HOSPITAL_BASED_OUTPATIENT_CLINIC_OR_DEPARTMENT_OTHER): Payer: BC Managed Care – PPO | Admitting: Physical Therapy

## 2022-07-25 ENCOUNTER — Other Ambulatory Visit: Payer: Self-pay

## 2022-07-25 VITALS — BP 126/72 | HR 95 | Temp 98.2°F | Ht 64.0 in | Wt 198.8 lb

## 2022-07-25 DIAGNOSIS — F320 Major depressive disorder, single episode, mild: Secondary | ICD-10-CM | POA: Diagnosis not present

## 2022-07-25 DIAGNOSIS — H1010 Acute atopic conjunctivitis, unspecified eye: Secondary | ICD-10-CM | POA: Diagnosis not present

## 2022-07-25 DIAGNOSIS — J302 Other seasonal allergic rhinitis: Secondary | ICD-10-CM

## 2022-07-25 DIAGNOSIS — R6883 Chills (without fever): Secondary | ICD-10-CM | POA: Diagnosis not present

## 2022-07-25 DIAGNOSIS — R600 Localized edema: Secondary | ICD-10-CM | POA: Diagnosis not present

## 2022-07-25 DIAGNOSIS — R059 Cough, unspecified: Secondary | ICD-10-CM | POA: Diagnosis not present

## 2022-07-25 DIAGNOSIS — M25672 Stiffness of left ankle, not elsewhere classified: Secondary | ICD-10-CM

## 2022-07-25 DIAGNOSIS — R052 Subacute cough: Secondary | ICD-10-CM | POA: Insufficient documentation

## 2022-07-25 DIAGNOSIS — J45909 Unspecified asthma, uncomplicated: Secondary | ICD-10-CM | POA: Diagnosis not present

## 2022-07-25 DIAGNOSIS — M25572 Pain in left ankle and joints of left foot: Secondary | ICD-10-CM | POA: Diagnosis not present

## 2022-07-25 DIAGNOSIS — F32A Depression, unspecified: Secondary | ICD-10-CM | POA: Insufficient documentation

## 2022-07-25 DIAGNOSIS — R262 Difficulty in walking, not elsewhere classified: Secondary | ICD-10-CM | POA: Diagnosis not present

## 2022-07-25 DIAGNOSIS — J3089 Other allergic rhinitis: Secondary | ICD-10-CM

## 2022-07-25 MED ORDER — SERTRALINE HCL 25 MG PO TABS: 25.0000 mg | ORAL_TABLET | Freq: Every day | ORAL | 2 refills | Status: DC

## 2022-07-25 NOTE — Patient Instructions (Addendum)
Thank you, Ms.Merrin Carmin Alvidrez for allowing [place] to provide your care today. Today we discussed your coughing symptoms and your recent life challenges.  Cough: We are obtaining a COVID and flu test as well as a chest x ray to make sure you dont have pneumonia. We will call you with the results of these exams and adjust our plan accordingly. - Please continue with your asthma medication, allergy medication, cough suppressant (Guaifenesin during the day), Robitussin at night, fluticasone during the day, nasal rinses, and your lozanges for help with cough  Depression: Please start taking sertraline daily.   I added some information about this medication to this packet. We would like to see your in 4 weeks to follow up with your symptoms. Please remember that this medication make take 4-6 weeks to start working, so stick with it during this period unless you feel that side effects are impacting your life.  Therapist link: [email or web address]  Please follow-up in in 4 weeks.  Please make sure to arrive 15 minutes prior to your next appointment. If you arrive late, you may be asked to reschedule.    We look forward to seeing you next time. Please call our clinic at [phone] if you have any questions or concerns. The best time to call is Monday-Friday from 9am-4pm, but there is someone available 24/7. If after hours or the weekend, call the main hospital number and ask for the Internal Medicine Resident On-Call. If you need medication refills, please notify your pharmacy one week in advance and they will send [place] a request.   Thank you for letting us take part in your care. Wishing you the best!  Romana Juniper, MD 07/25/2022, 3:49 PM [facility] Internal [facility] Resident, PGY-1

## 2022-07-25 NOTE — Assessment & Plan Note (Signed)
Patient presenting to clinic reporting changes in her home life affecting her mental state. Patient became tearful talking about the challenges she's facing: recently moved in with boyfriend, is helping raise his teenage daughters, has increased responsibilities at work, and undergoing surgery in the past few months. Patient PHQ-9 today score was 14. She is feeling sad and unmotivated and increasingly hard on herself. Patient does not endorse suicidal ideation. Spoke with patient about coping mechanisms and offered information for counseling resources for support and coping techniques in this life transition. Patient was also interested in starting antidepressant trial . Will start on Sertraline 25 mg as patient has not tried anti-depressants in the past. -  Start Sertraline 25 mg daily -  Follow up in 4 weeks to assess symptoms on current medication -  Repeat PHQ-9 at follow up, may need increased dose of current medication at follow up

## 2022-07-25 NOTE — Assessment & Plan Note (Deleted)
Congestion, and productive cough for about 12 days. This symptoms have continued to worsed without improvement of

## 2022-07-25 NOTE — Progress Notes (Signed)
Subjective:  CC: Persistent cough  HPI:  [name] is a 46 y.o. female with a past medical history significant for asthma and recurrent URI, presents today for persistent cough. Please see problem based assessment and plan for additional details.  Past Medical History:  Diagnosis Date   Anemia    Angio-edema    Asthma    Basal cell carcinoma ~ 1999   collarbone   Blood clot in vein ~ 2008   "left breast"    Complication of anesthesia    "I have trouble waking up afterwards" (12/18/2017)   Diarrhea 04/01/2019   Eczema    Family history of adverse reaction to anesthesia    "mom has trouble waking up afterwards" (12/18/2017)   Fluttering heart    "comes and goes" (12/18/2017)   GERD (gastroesophageal reflux disease)    Headache    "weekly" (12/18/2017)   History of kidney stones    Recurrent upper respiratory infection (URI)    Seizures ([place]) 1980 X 1   "bland stare"   Torticollis    Vertigo     Current Outpatient Medications on File Prior to Visit  Medication Sig Dispense Refill   albuterol (PROVENTIL) (2.5 MG/3ML) 0.083% nebulizer solution Take 3 mLs (2.5 mg total) by nebulization every 4 (four) hours as needed for wheezing or shortness of breath (coughing fits). 75 mL 2   azelastine (ASTELIN) 0.1 % nasal spray Place 1-2 sprays into both nostrils 2 (two) times daily as needed (nasal drainage). Use in each nostril as directed 30 mL 5   fluticasone furoate-vilanterol (BREO ELLIPTA) 200-25 MCG/INH AEPB Inhale 1 puff into the lungs daily. Rinse mouth after each use. 60 each 5   gabapentin (NEURONTIN) 300 MG capsule Take 1 capsule (300 mg total) by mouth 3 (three) times daily for 14 days. 42 capsule 0   montelukast (SINGULAIR) 10 MG tablet Take 1 tablet (10 mg total) by mouth at bedtime. 30 tablet 5   mupirocin ointment (BACTROBAN) 2 % Apply 1 Application topically daily. 30 g 0   ondansetron (ZOFRAN-ODT) 4 MG disintegrating tablet Take 1 tablet (4 mg total) by  mouth every 8 (eight) hours as needed for nausea or vomiting. 20 tablet 0   rivaroxaban (XARELTO) 10 MG TABS tablet Take 1 tablet (10 mg total) by mouth daily. 30 tablet 0   Spacer/Aero-Holding Chambers (EQ SPACE CHAMBER ANTI-STATIC) DEVI Inhale into the lungs as directed.     SUMAtriptan (IMITREX) 50 MG tablet TAKE 1 TABLET BY MOUTH ONCE AS NEEDED FOR UP TO 15 DAYS FOR MIGRAINE. MAY REPEAT IN 2 HOURS IF HEADACHE PERSISTS OR RECURS. 9 tablet 1   Tiotropium Bromide Monohydrate (SPIRIVA RESPIMAT) 1.25 MCG/ACT AERS Inhale 2 puffs into the lungs daily. 4 g 5   VENTOLIN HFA 108 (90 Base) MCG/ACT inhaler INHALE 1 TO 2 PUFFS BY MOUTH EVERY 6 HOURS AS NEEDED FOR WHEEZING FOR SHORTNESS OF BREATH 18 g 0   No current facility-administered medications on file prior to visit.    Family History  Problem Relation Age of Onset   Allergic rhinitis Mother    Asthma Mother     Social History   Socioeconomic History   Marital status: Divorced    Spouse name: Not on file   Number of children: Not on file   Years of education: Not on file   Highest education level: Not on file  Occupational History   Not on file  Tobacco Use   Smoking status: Never  Smokeless tobacco: Never  Vaping Use   Vaping Use: Never used  Substance and Sexual Activity   Alcohol use: Not Currently    Comment: 12/18/2017 "maybe 1/year on my birthday"   Drug use: No   Sexual activity: Yes    Partners: Male    Birth control/protection: None, OCP  Other Topics Concern   Not on file  Social History Narrative   Not on file   Social Determinants of Health   Financial Resource Strain: Not on file  Food Insecurity: No Food Insecurity (07/21/2021)   Hunger Vital Sign    Worried About Running Out of Food in the Last Year: Never true    [place] in the Last Year: Never true  Transportation Needs: No Transportation Needs (07/21/2021)   PRAPARE - Hydrologist (Medical): No    Lack of  Transportation (Non-Medical): No  Physical Activity: Not on file  Stress: Not on file  Social Connections: Not on file  Intimate Partner Violence: Not on file    Review of Systems: ROS negative except for what is noted on the assessment and plan.  Objective:   Vitals:   07/25/22 1452  BP: 126/72  Pulse: 95  Temp: 98.2 F (36.8 C)  TempSrc: Oral  SpO2: 96%  Weight: 198 lb 12.8 oz (90.2 kg)  Height: '5\' 4"'$  (1.626 m)    Physical Exam: Constitutional: well-appearing woman sitting in chair, in no acute distress HENT: normocephalic atraumatic, mucous membranes moist.  Yellow mucus in nasal passages, without tenderness in sinuses Eyes: conjunctiva non-erythematous Neck: supple Cardiovascular: regular rate and rhythm, no m/r/g Pulmonary/Chest: normal work of breathing on room air, clear to auscultation bilaterally without rales or wheezes Abdominal: soft, non-tender, non-distended MSK: normal bulk and tone Neurological: alert & oriented x 3, 5/5 strength in bilateral upper and lower extremities, normal gait Skin: warm and dry Psych: sad mood and affect     Assessment & Plan:   Subacute cough Patient with a PHMx of asthma and recurrent URI's presents today with a persistent cought for the past two weeks. This started ~7/30 with rhinorrhea and fullness and congestion in her forehead and cheeks accompanied by clear-yellow mucus. Patient had a telehealth appointment with [facility] provider;  at the time, patient took a home COVID test, which was negative, and was instructed to continue conservative management.  Patient returns to clinic with productive cough for about 12 days. Her initial symptoms have continued to worsen. Patient is currently using her albuterol inhaler multiple times a day. Patient reports increased volume of sputum that triggers multiple bouts of cough. She also feels congested from sinuses and chest. The chest congestion feels worse at night. Denies fever, myalgias,  hemoptysis, wheezing, increased work of breathing. There is mild rib pain she attributes to the coughing episodes. Patient denies smoking or being near sick contacts.   On exam, there is evidence of yellow mucus in nasal passages, without tenderness in sinuses. Patient is clear to auscultation bilaterally without rales or wheezes. Coughing episodes were present during this visit. Suspect post infectious cough. However;given that patient is presenting with subacute cough with increased sputum production, chest congestion, and has a structural lung disease after an URI, will get a chest XRAY to evaluate for pneumonia. Will also obtain a COVID and flu test given the length and worsening of symptoms. If results are negative, will consider giving patient a short steroid course for   - Continue conservative management, including  anti-histamine, steroid spray, cough suppressant, expectorant, sinus rinses, and lozanges. -Chest Xray, pending -COVID, flu test, pending  Depression Patient presenting to clinic reporting changes in her home life affecting her mental state. Patient became tearful talking about the challenges she's facing: recently moved in with boyfriend, is helping raise his teenage daughters, has increased responsibilities at work, and undergoing surgery in the past few months. Patient PHQ-9 today score was 14. She is feeling sad and unmotivated and increasingly hard on herself. Patient does not endorse suicidal ideation. Spoke with patient about coping mechanisms and offered information for counseling resources for support and coping techniques in this life transition. Patient was also interested in starting antidepressant trial . Will start on Sertraline 25 mg as patient has not tried anti-depressants in the past. -  Start Sertraline 25 mg daily -  Follow up in 4 weeks to assess symptoms on current medication -  Repeat PHQ-9 at follow up, may need increased dose of current medication at follow up    Patient seen with Dr. Evette Doffing

## 2022-07-25 NOTE — Therapy (Signed)
OUTPATIENT PHYSICAL THERAPY LOWER EXTREMITY TREATMENT   Patient Name: Erin Collins MRN: [id number] DOB:18-May-1976, 46 y.o., female Today's Date: 07/25/2022   PT End of Session - 07/25/22 0741     Visit Number 7    Number of Visits 25    Date for PT Re-Evaluation 09/09/22    Authorization Type BCBS    PT Start Time 0735    PT Stop Time 0815    PT Time Calculation (min) 40 min    Activity Tolerance Patient tolerated treatment well    Behavior During Therapy [facility] for tasks assessed/performed              Past Medical History:  Diagnosis Date   Anemia    Angio-edema    Asthma    Basal cell carcinoma ~ 1999   collarbone   Blood clot in vein ~ 2008   "left breast"    Complication of anesthesia    "I have trouble waking up afterwards" (12/18/2017)   Diarrhea 04/01/2019   Eczema    Family history of adverse reaction to anesthesia    "mom has trouble waking up afterwards" (12/18/2017)   Fluttering heart    "comes and goes" (12/18/2017)   GERD (gastroesophageal reflux disease)    Headache    "weekly" (12/18/2017)   History of kidney stones    Recurrent upper respiratory infection (URI)    Seizures ([place]) 1980 X 1   "bland stare"   Torticollis    Vertigo    Past Surgical History:  Procedure Laterality Date   BASAL CELL CARCINOMA EXCISION Right ~ 1999   "collarbone"   DILATION AND CURETTAGE OF UTERUS  2005   LAPAROSCOPIC ENDOMETRIOSIS FULGURATION  2005   NASAL SEPTOPLASTY W/ TURBINOPLASTY Bilateral 2014   [place]   TUBAL LIGATION Right 2005   TYMPANOSTOMY TUBE PLACEMENT     Patient Active Problem List   Diagnosis Date Noted   Urine discoloration 07/20/2022   HSV infection 04/28/2022   Headache    Snoring    Insomnia 08/19/2021   Not well controlled moderate persistent asthma 04/16/2021   Sinusitis 10/08/2020   Osteoporosis 02/04/2020   Seasonal and perennial allergic rhinoconjunctivitis 12/31/2018   Other atopic dermatitis  11/28/2018   Multiple drug allergies 11/28/2018   Healthcare maintenance 05/06/2018    PCP: Starlyn Skeans, MD  REFERRING PROVIDER: Criselda Peaches, DPM  REFERRING DIAG:  [phone] (ICD-10-CM) - Gastrocnemius equinus of left lower extremity  M92.62 (ICD-10-CM) - Haglund's deformity of left heel  M76.62 (ICD-10-CM) - Tendonitis, Achilles, left  Z98.890 (ICD-10-CM) - Status post surgery  Post op Achilles reconstruction and repair 04/29/22. Can be toe touch WB in boot until 06/27/22. Then can progress to full WB in boot with wedges. OK to come out of boot for exercises gradually   THERAPY DIAG:  Pain in left ankle and joints of left foot  Stiffness of left ankle, not elsewhere classified  Difficulty in walking, not elsewhere classified  Localized edema  Rationale for Evaluation and Treatment Rehabilitation  ONSET DATE: DOS 04/29/22  SUBJECTIVE:   SUBJECTIVE STATEMENT: Pt reports that she walked around house barefoot with both crutches, "it went good".  She states she was in pool for "a good hour" on Sunday.  She reports the tape was ok, felt fine, didn't help too much with swelling; she removed tape last night without any skin irritation.    PERTINENT HISTORY: Chronic h/o foot issues  PAIN:  Are you having pain? Yes: NPRS scale: none/10 Pain location: Lt post heel and medial maleolus Pain description: s Aggravating factors: WB, supination Relieving factors: ice  PRECAUTIONS: Other: ankle WB  WEIGHT BEARING RESTRICTIONS  Can be toe touch WB in boot until 06/27/22. Then can progress to full WB in boot with wedges. OK to come out of boot for exercises gradually 07/22/22:  can be out of boot completely with gait.    FALLS:  Has patient fallen in last 6 months? Yes. 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: Independent  PATIENT GOALS hiking (was walking 3-5 mi/day), bike rides (did 100 mile bike rides)   OBJECTIVE:    PATIENT SURVEYS:  FOTO  20  COGNITION:  Overall cognitive status: Within functional limits for tasks assessed     SENSATION: Some numbness on post heel  EDEMA:  Circumferential: Left 29 cm around maleoli; Right 26 cm    PALPATION: Minimal mobility through foot and ankle due to edema  LOWER EXTREMITY ROM:  Active/passive ROM Left eval LEFT 7/12 LEFT 7/18 LEFT 7/25 LEFT Left 8/4  Ankle dorsiflexion -32/-25 -24 -16 -17/-10 -6 -15  Ankle plantarflexion        Ankle inversion        Ankle eversion         (Blank rows = not tested)  LOWER EXTREMITY MMT: not tested at eval   TODAY'S TREATMENT: 8/7: Pt seen for aquatic therapy today.  Treatment took place in water 3.25-4 ft 3 in depth at the [employer] pool. Temp of water was 91.  Pt entered/exited the pool via stairs with step-to pattern with supervision with bilat rail.  * holding wall:  slow weight shifts side to side;  forward and backward in staggered stance with each foot forward  *in water just above 4 ft holding single yellow noodle in front of her-    -  forward / backward gait, cues for heel strike, even stance time and length   - side stepping   * stairs:    -  Lt foot on 2nd step with forward lunge for DF stretch, range to tolerance, holding 10sec x 5   -  L/R quad stretch with foot on 3rd step behind her; L/ R hamstring stretch with foot on 2nd step   - L forward step ups on 1st step, retro step down with RLE x 12, cues for controlled motion  * return to forward/ backward gait working again on even step length/ stance time   * holding wall:     - squats allowing heels to come up   - L/R hip abdct x 12    - ankle circles  Pt requires the buoyancy and hydrostatic pressure of water for support, and to offload joints by unweighting joint load by at least 50 % in navel deep water and by at least 75-80% in chest to neck deep water.  Viscosity of the water is needed for resistance of strengthening. Water current perturbations  provides challenge to standing balance requiring increased core activation.  7/31: GAIT: with crutches, no boot- focusing on heel-toe pattern and flexibility Seated DF- reducing great toe extension MANUAL: edema mob, distraction & AP talocrural Seated 90/90 heel press down for DF mob   7/25: MANUAL: edema mob around foot/ankle Elevated toe & ankle movement- prior to and after manual Standing with walker- pressing heel down toward floor  Also done with hand towel roll under heel for pressure into  calcaneus Seated towel slide into DF VASO ankle 15 min, low pressure 34 deg  07/05/22: MANUAL: edema mob around ankle, STM to plantar aspect of foot Standing weight shift to foot- walker for UE support  Tandem fwd weight shift Lateral weight shift with 2" step under Rt foot Rt foot on step- Lt LE traction via gravity Seated stretch: HS, adductors, abductors VASO ankle 15 min, low pressure 34 deg     PATIENT EDUCATION:  Education details: Geophysicist/field seismologist of condition, POC, HEP, exercise form/rationale  Person educated: Patient Education method: Explanation, Demonstration, Tactile cues, Verbal cues, and Handouts Education comprehension: verbalized understanding, returned demonstration, verbal cues required, tactile cues required, and needs further education   HOME EXERCISE PROGRAM: [id number]- prefers digital  ASSESSMENT:  CLINICAL IMPRESSION: Pt demonstrating improved posture with gait compared to last session. She reported decrease in tightness in Lt ankle after completing weight shifts with long holds in L stance. Requires frequent cues for increasing L stance time and R step length forward with forward gait. She is making progress towards goals.    OBJECTIVE IMPAIRMENTS Abnormal gait, decreased activity tolerance, decreased mobility, difficulty walking, decreased ROM, decreased strength, increased edema, increased muscle spasms, impaired sensation, improper body mechanics, postural  dysfunction, and pain.   ACTIVITY LIMITATIONS carrying, standing, squatting, stairs, transfers, bathing, dressing, locomotion level, and caring for others  PARTICIPATION LIMITATIONS: meal prep, cleaning, laundry, driving, community activity, and occupation  PERSONAL FACTORS  asthma, osteoporosis  are also affecting patient's functional outcome.   REHAB POTENTIAL: Good  CLINICAL DECISION MAKING: Stable/uncomplicated  EVALUATION COMPLEXITY: Low   GOALS: Goals reviewed with patient? Yes  SHORT TERM GOALS: Target date: 07/05/2022  Ankle DF to -10 or better Baseline: Goal status: achieved  2.  Demo proper gait pattern in alignment with WB precautions Baseline:  Goal status: achieved  3.  Resting pain <=7/10 Baseline:  Goal status: achieved   LONG TERM GOALS: Target date: 09/06/2022   Pt will demo ability to perform at least 5 single leg heel raises with UE support for balance Baseline:  Goal status: INITIAL  2.  Pt will return to biking Baseline:  Goal status: INITIAL  3.  Pt will return to hiking on trails with moderate difficulty Baseline:  Goal status: INITIAL  4.  Pt to meet stated FOTO goal Baseline:  Goal status: INITIAL  5.  Gross pain with ADLs <=3/10 Baseline:  Goal status: INITIAL  6.  Demo normalized gait pattern Baseline:  Goal status: INITIAL   PLAN: PT FREQUENCY: 1-2x/week  PT DURATION: 12 weeks  PLANNED INTERVENTIONS: Therapeutic exercises, Therapeutic activity, Neuromuscular re-education, Balance training, Gait training, Patient/Family education, Joint mobilization, Stair training, Aquatic Therapy, Dry Needling, Spinal mobilization, Cryotherapy, Moist heat, Taping, Vasopneumatic device, Manual therapy, and Re-evaluation  PLAN FOR NEXT SESSION: ktape application; continued aquatics with focus on normalizing gait and improving ROM  Kerin Perna, PTA 07/25/22 11:27 AM

## 2022-07-25 NOTE — Assessment & Plan Note (Addendum)
Patient with a PHMx of asthma and recurrent URI's presents today with a persistent cought for the past two weeks. This started ~7/30 with rhinorrhea and fullness and congestion in her forehead and cheeks accompanied by clear-yellow mucus. Patient had a telehealth appointment with [facility] provider;  at the time, patient took a home COVID test, which was negative, and was instructed to continue conservative management.  Patient returns to clinic with productive cough for about 12 days. Her initial symptoms have continued to worsen. Patient is currently using her albuterol inhaler multiple times a day. Patient reports increased volume of sputum that triggers multiple bouts of cough. She also feels congested from sinuses and chest. The chest congestion feels worse at night. Denies fever, myalgias, hemoptysis, wheezing, increased work of breathing. There is mild rib pain she attributes to the coughing episodes. Patient denies smoking or being near sick contacts.   On exam, there is evidence of yellow mucus in nasal passages, without tenderness in sinuses. Patient is clear to auscultation bilaterally without rales or wheezes. Coughing episodes were present during this visit. Suspect post infectious cough. However;given that patient is presenting with subacute cough with increased sputum production, chest congestion, and has a structural lung disease after an URI, will get a chest XRAY to evaluate for pneumonia. Will also obtain a COVID and flu test given the length and worsening of symptoms. If results are negative, will consider giving patient a short steroid course for   - Continue conservative management, including anti-histamine, steroid spray, cough suppressant, expectorant, sinus rinses, and lozanges. -Chest Xray, pending -COVID, flu test, pending

## 2022-07-26 ENCOUNTER — Encounter: Payer: Self-pay | Admitting: Student

## 2022-07-26 ENCOUNTER — Ambulatory Visit (HOSPITAL_BASED_OUTPATIENT_CLINIC_OR_DEPARTMENT_OTHER): Payer: BC Managed Care – PPO | Admitting: Physical Therapy

## 2022-07-26 LAB — COVID-19, FLU A+B AND RSV
Influenza A, NAA: NOT DETECTED
Influenza B, NAA: NOT DETECTED
RSV, NAA: NOT DETECTED
SARS-CoV-2, NAA: NOT DETECTED

## 2022-07-26 NOTE — Progress Notes (Signed)
Internal Medicine Clinic Attending  I saw and evaluated the patient.  I personally confirmed the key portions of the history and exam documented by Dr. Simeon Craft and I reviewed pertinent patient test results.  The assessment, diagnosis, and plan were formulated together and I agree with the documentation in the resident's note.

## 2022-07-28 NOTE — Progress Notes (Signed)
Patient called.  Patient aware. Patient continues to have some coughing, though has improved. Given that her chest xray did not show signs of infection, patient would likely benefit from a course of prednisone to help her with inflammation. Offered option to patient but she declined as she has tried Prednisone in the past and dislikes the side effects. Patient will call if she changes her mind

## 2022-07-29 ENCOUNTER — Ambulatory Visit (HOSPITAL_BASED_OUTPATIENT_CLINIC_OR_DEPARTMENT_OTHER): Payer: BC Managed Care – PPO | Admitting: Physical Therapy

## 2022-07-29 ENCOUNTER — Encounter (HOSPITAL_BASED_OUTPATIENT_CLINIC_OR_DEPARTMENT_OTHER): Payer: Self-pay | Admitting: Physical Therapy

## 2022-07-29 DIAGNOSIS — R600 Localized edema: Secondary | ICD-10-CM

## 2022-07-29 DIAGNOSIS — R262 Difficulty in walking, not elsewhere classified: Secondary | ICD-10-CM | POA: Diagnosis not present

## 2022-07-29 DIAGNOSIS — M25672 Stiffness of left ankle, not elsewhere classified: Secondary | ICD-10-CM

## 2022-07-29 DIAGNOSIS — M25572 Pain in left ankle and joints of left foot: Secondary | ICD-10-CM | POA: Diagnosis not present

## 2022-07-29 NOTE — Therapy (Signed)
OUTPATIENT PHYSICAL THERAPY LOWER EXTREMITY TREATMENT   Patient Name: Erin Collins MRN: [id number] DOB:June 04, 1976, 46 y.o., female Today's Date: 07/29/2022   PT End of Session - 07/29/22 0829     Visit Number 8    Number of Visits 25    Date for PT Re-Evaluation 09/09/22    Authorization Type BCBS    PT Start Time 0820    PT Stop Time 0858    PT Time Calculation (min) 38 min              Past Medical History:  Diagnosis Date   Anemia    Angio-edema    Asthma    Basal cell carcinoma ~ 1999   collarbone   Blood clot in vein ~ 2008   "left breast"    Complication of anesthesia    "I have trouble waking up afterwards" (12/18/2017)   Diarrhea 04/01/2019   Eczema    Family history of adverse reaction to anesthesia    "mom has trouble waking up afterwards" (12/18/2017)   Fluttering heart    "comes and goes" (12/18/2017)   GERD (gastroesophageal reflux disease)    Headache    "weekly" (12/18/2017)   History of kidney stones    Recurrent upper respiratory infection (URI)    Seizures ([place]) 1980 X 1   "bland stare"   Torticollis    Vertigo    Past Surgical History:  Procedure Laterality Date   BASAL CELL CARCINOMA EXCISION Right ~ 1999   "collarbone"   DILATION AND CURETTAGE OF UTERUS  2005   LAPAROSCOPIC ENDOMETRIOSIS FULGURATION  2005   NASAL SEPTOPLASTY W/ TURBINOPLASTY Bilateral 2014   SINOSCOPY     TONSILLECTOMY  1980   TUBAL LIGATION Right 2005   TYMPANOSTOMY TUBE PLACEMENT     Patient Active Problem List   Diagnosis Date Noted   Subacute cough 07/25/2022   Depression 07/25/2022   Urine discoloration 07/20/2022   HSV infection 04/28/2022   Headache    Snoring    Insomnia 08/19/2021   Not well controlled moderate persistent asthma 04/16/2021   Sinusitis 10/08/2020   Osteoporosis 02/04/2020   Seasonal and perennial allergic rhinoconjunctivitis 12/31/2018   Other atopic dermatitis 11/28/2018   Multiple drug allergies 11/28/2018   Healthcare  maintenance 05/06/2018    PCP: Starlyn Skeans, MD  REFERRING PROVIDER: Criselda Peaches, DPM  REFERRING DIAG:  [phone] (ICD-10-CM) - Gastrocnemius equinus of left lower extremity  M92.62 (ICD-10-CM) - Haglund's deformity of left heel  M76.62 (ICD-10-CM) - Tendonitis, Achilles, left  Z98.890 (ICD-10-CM) - Status post surgery  Post op Achilles reconstruction and repair 04/29/22. Can be toe touch WB in boot until 06/27/22. Then can progress to full WB in boot with wedges. OK to come out of boot for exercises gradually   THERAPY DIAG:  Pain in left ankle and joints of left foot  Stiffness of left ankle, not elsewhere classified  Difficulty in walking, not elsewhere classified  Localized edema  Rationale for Evaluation and Treatment Rehabilitation  ONSET DATE: DOS 04/29/22  SUBJECTIVE:   SUBJECTIVE STATEMENT: Pt reports she hasn't felt well, with increased asthma symptoms; hasn't done much of HEP.  She hasn't worn the boot since Monday.  She typically wears compression sock during day. She arrives ambulating with running shoes donned and bilat axillary crutches.    PERTINENT HISTORY: Chronic h/o foot issues  PAIN:  Are you having pain? Yes: NPRS scale: 4/10 Pain location: Lt post heel and medial maleolus Pain description: achy,  tight Aggravating factors: WB, supination Relieving factors: ice  PRECAUTIONS: Other: ankle WB  WEIGHT BEARING RESTRICTIONS  Can be toe touch WB in boot until 06/27/22. Then can progress to full WB in boot with wedges. OK to come out of boot for exercises gradually 07/22/22:  can be out of boot completely with gait.    FALLS:  Has patient fallen in last 6 months? Yes. 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: Independent  PATIENT GOALS hiking (was walking 3-5 mi/day), bike rides (did 100 mile bike rides)   OBJECTIVE:    PATIENT SURVEYS:  FOTO 20  COGNITION:  Overall cognitive status: Within functional limits for tasks  assessed     SENSATION: Some numbness on post heel  EDEMA:  Circumferential: Left 29 cm around maleoli; Right 26 cm    PALPATION: Minimal mobility through foot and ankle due to edema  LOWER EXTREMITY ROM:  Active/passive ROM Left eval LEFT 7/12 LEFT 7/18 LEFT 7/25 LEFT Left 8/4 Left 8/11  Ankle dorsiflexion -32/-25 -24 -16 -17/-10 -6 -15 -15/-8  Ankle plantarflexion         Ankle inversion         Ankle eversion          (Blank rows = not tested)  LOWER EXTREMITY MMT: not tested at eval   TODAY'S TREATMENT: 8/11: Pt seen for aquatic therapy today.  Treatment took place in water 3.25-4 ft, 3" depth at the [facility] pool. Temp of water was 91.  Pt entered/exited the pool via stairs with step-to pattern with supervision with bilat rail.  (Prior to entering in water- adjusted crutches to proper height - were too high)  *in water just above 4 ft holding white barbell in front of her-    -  forward / backward gait, cues for heel strike, even stance time and length   - side stepping   * holding wall:  slow weight shifts side to side;  forward and backward in staggered stance with each foot forward  * back against wall: 3 way leg stretch with LE supported by square noodle(like strap)   * stairs:    -  Lt foot on 2nd step with forward lunge for DF stretch, range to tolerance, holding 10sec x 5; Forward L step ups x 10 with   BUE   * holding wall:     - squats allowing heels to come up, 3 sec pause in the squat x10   - L/R hip abdct x 12, cues for hips aligned with trunk    - toe/ quarter heel raises x 10   - ankle circles   - semi-staggered stance mini squats to stretch Lt foot flexors  * return to walking forward/backward with cues for even step   * after pt dried off:  Reg Rock tape applied in stirrup pattern (under L arch and crossing at talus) - no stretch; I strips of Regular Rock tape applied medial to incision at Lt calcaneus, and perpendicular one across  calcaneus covering medial piece -10% stretch - all to help increase proprioception and decrease edema.  Reviewed safe removal technique.  Pt requires the buoyancy and hydrostatic pressure of water for support, and to offload joints by unweighting joint load by at least 50 % in navel deep water and by at least 75-80% in chest to neck deep water.  Viscosity of the water is needed for resistance of strengthening. Water current perturbations provides challenge to standing balance  requiring increased core activation.   PATIENT EDUCATION:  Education details: Geophysicist/field seismologist of condition, POC, HEP, exercise form/rationale  Person educated: Patient Education method: Explanation, Demonstration, Tactile cues, Verbal cues, and Handouts Education comprehension: verbalized understanding, returned demonstration, verbal cues required, tactile cues required, and needs further education   HOME EXERCISE PROGRAM: [email or web address]- prefers digital  ASSESSMENT:  CLINICAL IMPRESSION: Pt no longer wearing boot, and instead a shoe.  She is observed walking into session on toes of Lt foot upon arrival with limited WB through foot.  Until she is in 4+ ft of water, gait is very antalgic.  Improved tolerance for gait and gait pattern with UE support on floatation device and increased time.Pt very guarded with increased L WB in water with decreased depth.   Pt reported reduction of pain and tightness at end of session. Verbally reviewed HEP.  Goals are ongoing. Pt will benefit from continued PT intervention to maximize functional mobility.    OBJECTIVE IMPAIRMENTS Abnormal gait, decreased activity tolerance, decreased mobility, difficulty walking, decreased ROM, decreased strength, increased edema, increased muscle spasms, impaired sensation, improper body mechanics, postural dysfunction, and pain.   ACTIVITY LIMITATIONS carrying, standing, squatting, stairs, transfers, bathing, dressing, locomotion level, and caring for  others  PARTICIPATION LIMITATIONS: meal prep, cleaning, laundry, driving, community activity, and occupation  PERSONAL FACTORS  asthma, osteoporosis  are also affecting patient's functional outcome.   REHAB POTENTIAL: Good  CLINICAL DECISION MAKING: Stable/uncomplicated  EVALUATION COMPLEXITY: Low   GOALS: Goals reviewed with patient? Yes  SHORT TERM GOALS: Target date: 07/05/2022  Ankle DF to -10 or better Baseline: Goal status: achieved  2.  Demo proper gait pattern in alignment with WB precautions Baseline:  Goal status: achieved  3.  Resting pain <=7/10 Baseline:  Goal status: achieved   LONG TERM GOALS: Target date: 09/06/2022   Pt will demo ability to perform at least 5 single leg heel raises with UE support for balance Baseline:  Goal status: INITIAL  2.  Pt will return to biking Baseline:  Goal status: INITIAL  3.  Pt will return to hiking on trails with moderate difficulty Baseline:  Goal status: INITIAL  4.  Pt to meet stated FOTO goal Baseline:  Goal status: INITIAL  5.  Gross pain with ADLs <=3/10 Baseline:  Goal status: INITIAL  6.  Demo normalized gait pattern Baseline:  Goal status: INITIAL   PLAN: PT FREQUENCY: 1-2x/week  PT DURATION: 12 weeks  PLANNED INTERVENTIONS: Therapeutic exercises, Therapeutic activity, Neuromuscular re-education, Balance training, Gait training, Patient/Family education, Joint mobilization, Stair training, Aquatic Therapy, Dry Needling, Spinal mobilization, Cryotherapy, Moist heat, Taping, Vasopneumatic device, Manual therapy, and Re-evaluation  PLAN FOR NEXT SESSION: ktape application; continued aquatics with focus on normalizing gait and improving ROM   Kerin Perna, PTA 07/29/22 12:19 PM

## 2022-08-01 NOTE — Telephone Encounter (Signed)
I spoke with the patient on the phone and addressed her concerns.

## 2022-08-02 ENCOUNTER — Ambulatory Visit (HOSPITAL_BASED_OUTPATIENT_CLINIC_OR_DEPARTMENT_OTHER): Payer: BC Managed Care – PPO | Admitting: Physical Therapy

## 2022-08-02 ENCOUNTER — Encounter (HOSPITAL_BASED_OUTPATIENT_CLINIC_OR_DEPARTMENT_OTHER): Payer: Self-pay | Admitting: Physical Therapy

## 2022-08-02 DIAGNOSIS — M25572 Pain in left ankle and joints of left foot: Secondary | ICD-10-CM

## 2022-08-02 DIAGNOSIS — M25672 Stiffness of left ankle, not elsewhere classified: Secondary | ICD-10-CM | POA: Diagnosis not present

## 2022-08-02 DIAGNOSIS — R600 Localized edema: Secondary | ICD-10-CM

## 2022-08-02 DIAGNOSIS — R262 Difficulty in walking, not elsewhere classified: Secondary | ICD-10-CM | POA: Diagnosis not present

## 2022-08-02 NOTE — Therapy (Signed)
OUTPATIENT PHYSICAL THERAPY LOWER EXTREMITY TREATMENT   Patient Name: Erin Collins MRN: [id number] DOB:March 07, 1976, 46 y.o., female Today's Date: 08/02/2022   PT End of Session - 08/02/22 0749     Visit Number 9    Number of Visits 25    Date for PT Re-Evaluation 09/09/22    Authorization Type BCBS    PT Start Time [phone]   pt arrived late; traffic   PT Stop Time 0815    PT Time Calculation (min) 33 min    Behavior During Therapy [facility] for tasks assessed/performed              Past Medical History:  Diagnosis Date   Anemia    Angio-edema    Asthma    Basal cell carcinoma ~ 1999   collarbone   Blood clot in vein ~ 2008   "left breast"    Complication of anesthesia    "I have trouble waking up afterwards" (12/18/2017)   Diarrhea 04/01/2019   Eczema    Family history of adverse reaction to anesthesia    "mom has trouble waking up afterwards" (12/18/2017)   Fluttering heart    "comes and goes" (12/18/2017)   GERD (gastroesophageal reflux disease)    Headache    "weekly" (12/18/2017)   History of kidney stones    Recurrent upper respiratory infection (URI)    Seizures ([place]) 1980 X 1   "bland stare"   Torticollis    Vertigo    Past Surgical History:  Procedure Laterality Date   BASAL CELL CARCINOMA EXCISION Right ~ 1999   "collarbone"   DILATION AND CURETTAGE OF UTERUS  2005   LAPAROSCOPIC ENDOMETRIOSIS FULGURATION  2005   NASAL SEPTOPLASTY W/ TURBINOPLASTY Bilateral 2014   [place]   TUBAL LIGATION Right 2005   TYMPANOSTOMY TUBE PLACEMENT     Patient Active Problem List   Diagnosis Date Noted   Subacute cough 07/25/2022   Depression 07/25/2022   Urine discoloration 07/20/2022   HSV infection 04/28/2022   Headache    Snoring    Insomnia 08/19/2021   Not well controlled moderate persistent asthma 04/16/2021   Sinusitis 10/08/2020   Osteoporosis 02/04/2020   Seasonal and perennial allergic rhinoconjunctivitis 12/31/2018   Other  atopic dermatitis 11/28/2018   Multiple drug allergies 11/28/2018   Healthcare maintenance 05/06/2018    PCP: Starlyn Skeans, MD  REFERRING PROVIDER: Criselda Peaches, DPM  REFERRING DIAG:  [phone] (ICD-10-CM) - Gastrocnemius equinus of left lower extremity  M92.62 (ICD-10-CM) - Haglund's deformity of left heel  M76.62 (ICD-10-CM) - Tendonitis, Achilles, left  Z98.890 (ICD-10-CM) - Status post surgery  Post op Achilles reconstruction and repair 04/29/22. Can be toe touch WB in boot until 06/27/22. Then can progress to full WB in boot with wedges. OK to come out of boot for exercises gradually   THERAPY DIAG:  Pain in left ankle and joints of left foot  Stiffness of left ankle, not elsewhere classified  Difficulty in walking, not elsewhere classified  Localized edema  Rationale for Evaluation and Treatment Rehabilitation  ONSET DATE: DOS 04/29/22  SUBJECTIVE:   SUBJECTIVE STATEMENT: Pt reports she was in pool for 2 hrs on Sunday.  She reports she is now ambulating up/down stairs with crutches instead of crawling up (for last 2.5 days).  She has a conference on Thursday; will try to get a WC if able to.    PERTINENT HISTORY: Chronic h/o foot issues  PAIN:  Are you having pain? Yes: NPRS scale: 4/10 Pain location: Lt forefoot at MP joints Pain description: achy, tight Aggravating factors: WB, supination Relieving factors: ice  PRECAUTIONS: Other: ankle WB  WEIGHT BEARING RESTRICTIONS  Can be toe touch WB in boot until 06/27/22. Then can progress to full WB in boot with wedges. OK to come out of boot for exercises gradually 07/22/22:  can be out of boot completely with gait.    FALLS:  Has patient fallen in last 6 months? Yes. 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: Independent  PATIENT GOALS hiking (was walking 3-5 mi/day), bike rides (did 100 mile bike rides)   OBJECTIVE:    PATIENT SURVEYS:  FOTO 20  COGNITION:  Overall cognitive status: Within  functional limits for tasks assessed     SENSATION: Some numbness on post heel  EDEMA:  Circumferential: Left 29 cm around maleoli; Right 26 cm    PALPATION: Minimal mobility through foot and ankle due to edema  LOWER EXTREMITY ROM:  Active/passive ROM Left eval LEFT 7/12 LEFT 7/18 LEFT 7/25 LEFT Left 8/4 Left 8/11  Ankle dorsiflexion -32/-25 -24 -16 -17/-10 -6 -15 -15/-8  Ankle plantarflexion         Ankle inversion         Ankle eversion          (Blank rows = not tested)  LOWER EXTREMITY MMT: not tested at eval   TODAY'S TREATMENT: 8/11: Pt seen for aquatic therapy today.  Treatment took place in water 3.25-4 ft, 3" depth at the [facility] pool. Temp of water was 91.  Pt entered/exited the pool via stairs with step-to pattern with supervision with bilat rail.   * Holding wall: slow weight shifts side to side;  forward and backward in staggered stance with each foot forward; squats; attempt at runners stretch  *in water just above 4 ft holding yellow noodle in front of her-    -  forward / backward gait, cues for heel strike, even stance time and length   - side stepping   *  Lt foot on 2nd step with forward lunge for DF stretch, range to tolerance, holding 10sec x 5; Forward L step ups x 10 with   BUE, cues for upright trunk and less use of UE  * return to walking forward/backward with cues for even step length  * straddling yellow noodle in deeper water: cycling, hip abdct/add, cc ski - 2 rounds with feet suspended  * return to walking forward/ backward with cues for even step length   Pt requires the buoyancy and hydrostatic pressure of water for support, and to offload joints by unweighting joint load by at least 50 % in navel deep water and by at least 75-80% in chest to neck deep water.  Viscosity of the water is needed for resistance of strengthening. Water current perturbations provides challenge to standing balance requiring increased core  activation.   PATIENT EDUCATION:  Education details: Geophysicist/field seismologist of condition, POC, HEP, exercise form/rationale  Person educated: Patient Education method: Explanation, Demonstration, Tactile cues, Verbal cues, and Handouts Education comprehension: verbalized understanding, returned demonstration, verbal cues required, tactile cues required, and needs further education   HOME EXERCISE PROGRAM: [email or web address]- prefers digital  ASSESSMENT:  CLINICAL IMPRESSION: Continued limited L ankle DF ROM affecting gait quality in pool and on land.  Pt reported reduction of tightness when submerged to 75%.  She reported increased soreness in medial L ankle after stepping down  with R foot down 5" step; reduced with suspended cycling in deeper water.  Goals are ongoing. Pt will benefit from continued PT intervention to maximize functional mobility.    OBJECTIVE IMPAIRMENTS Abnormal gait, decreased activity tolerance, decreased mobility, difficulty walking, decreased ROM, decreased strength, increased edema, increased muscle spasms, impaired sensation, improper body mechanics, postural dysfunction, and pain.   ACTIVITY LIMITATIONS carrying, standing, squatting, stairs, transfers, bathing, dressing, locomotion level, and caring for others  PARTICIPATION LIMITATIONS: meal prep, cleaning, laundry, driving, community activity, and occupation  PERSONAL FACTORS  asthma, osteoporosis  are also affecting patient's functional outcome.   REHAB POTENTIAL: Good  CLINICAL DECISION MAKING: Stable/uncomplicated  EVALUATION COMPLEXITY: Low   GOALS: Goals reviewed with patient? Yes  SHORT TERM GOALS: Target date: 07/05/2022  Ankle DF to -10 or better Baseline: Goal status: achieved  2.  Demo proper gait pattern in alignment with WB precautions Baseline:  Goal status: achieved  3.  Resting pain <=7/10 Baseline:  Goal status: achieved   LONG TERM GOALS: Target date: 09/06/2022   Pt will demo ability to  perform at least 5 single leg heel raises with UE support for balance Baseline:  Goal status: INITIAL  2.  Pt will return to biking Baseline:  Goal status: INITIAL  3.  Pt will return to hiking on trails with moderate difficulty Baseline:  Goal status: INITIAL  4.  Pt to meet stated FOTO goal Baseline:  Goal status: INITIAL  5.  Gross pain with ADLs <=3/10 Baseline:  Goal status: INITIAL  6.  Demo normalized gait pattern Baseline:  Goal status: INITIAL   PLAN: PT FREQUENCY: 1-2x/week  PT DURATION: 12 weeks  PLANNED INTERVENTIONS: Therapeutic exercises, Therapeutic activity, Neuromuscular re-education, Balance training, Gait training, Patient/Family education, Joint mobilization, Stair training, Aquatic Therapy, Dry Needling, Spinal mobilization, Cryotherapy, Moist heat, Taping, Vasopneumatic device, Manual therapy, and Re-evaluation  PLAN FOR NEXT SESSION: ktape application; continued aquatics with focus on normalizing gait and improving ROM   Kerin Perna, PTA 08/02/22 4:22 PM

## 2022-08-02 NOTE — Progress Notes (Signed)
[facility] Medicine Clinic Attending  I spoke with the patient.  I personally confirmed the key portions of the history and exam documented by the resident  and I reviewed pertinent patient test results.  The assessment, diagnosis, and plan were formulated together and I agree with the documentation in the resident's note.

## 2022-08-04 ENCOUNTER — Encounter (HOSPITAL_BASED_OUTPATIENT_CLINIC_OR_DEPARTMENT_OTHER): Payer: BC Managed Care – PPO | Admitting: Physical Therapy

## 2022-08-08 ENCOUNTER — Encounter: Payer: Self-pay | Admitting: Neurology

## 2022-08-08 ENCOUNTER — Ambulatory Visit (INDEPENDENT_AMBULATORY_CARE_PROVIDER_SITE_OTHER): Payer: BC Managed Care – PPO | Admitting: Neurology

## 2022-08-08 VITALS — BP 119/82 | HR 85 | Ht 64.0 in | Wt 195.8 lb

## 2022-08-08 DIAGNOSIS — R519 Headache, unspecified: Secondary | ICD-10-CM

## 2022-08-08 DIAGNOSIS — R0683 Snoring: Secondary | ICD-10-CM | POA: Diagnosis not present

## 2022-08-08 DIAGNOSIS — R0681 Apnea, not elsewhere classified: Secondary | ICD-10-CM

## 2022-08-08 DIAGNOSIS — R058 Other specified cough: Secondary | ICD-10-CM | POA: Diagnosis not present

## 2022-08-08 DIAGNOSIS — G4719 Other hypersomnia: Secondary | ICD-10-CM | POA: Diagnosis not present

## 2022-08-08 DIAGNOSIS — G478 Other sleep disorders: Secondary | ICD-10-CM

## 2022-08-08 NOTE — Patient Instructions (Signed)

## 2022-08-08 NOTE — Progress Notes (Signed)
Subjective:    Patient ID: Jadalee Westcott is a 46 y.o. female.  HPI    Star Age, MD, PhD [facility] [address] [place], [place] [address]  Dear Drs. Gilford Rile and [place],   I saw your patient, Mariadejesus Cade, kind request in my sleep clinic today for initial consultation of her sleep disorder, in particular, concern for underlying obstructive sleep apnea.  The patient is unaccompanied today.  As you know, Ms. Loomis is a 46 year old right-handed woman with an underlying medical history of anemia, asthma, eczema, reflux disease, recurrent headaches, and obesity, who reports snoring and excessive daytime somnolence as well as witnessed apneas.  I reviewed your office note from 04/18/2022.  Her Epworth sleepiness score is 16/24, fatigue severity score is 44 out of 63.  She lives with her boyfriend who has noted the occasional breathing pauses while she is asleep, he himself has sleep apnea and has a CPAP or similar machine.  She does not have a family history of sleep apnea.  She had a tonsillectomy at age 38.  She had nasal septum surgery as well as turbinate reduction in 2016.  She does have nocturnal coughing and frequent asthma flares.  She does not have nocturia but does have recurrent nocturnal or morning headaches.  Bedtime is generally around 10 or 10:30 PM and rise time around 6:45 AM.  She is a non-smoker and drinks no caffeine daily, rare alcohol.  She lives with her boyfriend.  She has a grown son, age 58 and her boyfriend has 2 girls, ages 58 and 9 to stay with them every other week.  She has a TV in her bedroom but does not watch it at night.  They have 3 cats in the household, not in their bedroom at night.  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  He had left ankle surgery in May 2023.  She sleeps with additional 2 pillows at night currently, one under her left foot  and the other in between her legs.  She has a history of sleep talking since childhood.    Her Past Medical History Is Significant For: Past Medical History:  Diagnosis Date   Anemia    Angio-edema    Asthma    Basal cell carcinoma ~ 1999   collarbone   Blood clot in vein ~ 2008   "left breast"    Complication of anesthesia    "I have trouble waking up afterwards" (12/18/2017)   Diarrhea 04/01/2019   Eczema    Family history of adverse reaction to anesthesia    "mom has trouble waking up afterwards" (12/18/2017)   Fluttering heart    "comes and goes" (12/18/2017)   GERD (gastroesophageal reflux disease)    Headache    "weekly" (12/18/2017)   History of kidney stones    Recurrent upper respiratory infection (URI)    Seizures ([place]) 1980 X 1   "bland stare"   Torticollis    Vertigo     Her Past Surgical History Is Significant For: Past Surgical History:  Procedure Laterality Date   ANKLE SURGERY Left 2023   [facility] Right ~ 1999   "collarbone"   DILATION AND CURETTAGE OF UTERUS  2005   LAPAROSCOPIC ENDOMETRIOSIS FULGURATION  2005   NASAL SEPTOPLASTY W/ TURBINOPLASTY Bilateral 2014   SINOSCOPY  TONSILLECTOMY  1980   TUBAL LIGATION Right 2005   TYMPANOSTOMY TUBE PLACEMENT      Her Family History Is Significant For: Family History  Problem Relation Age of Onset   Allergic rhinitis Mother    Asthma Mother     Her Social History Is Significant For: Social History   Socioeconomic History   Marital status: Divorced    Spouse name: Not on file   Number of children: Not on file   Years of education: Not on file   Highest education level: Not on file  Occupational History   Not on file  Tobacco Use   Smoking status: Never   Smokeless tobacco: Never  Vaping Use   Vaping Use: Never used  Substance and Sexual Activity   Alcohol use: Not Currently    Comment: 12/18/2017 "maybe 1/year on my birthday"  Rarely.    Drug use: No   Sexual activity: Yes    Partners: Male    Birth control/protection: None, OCP  Other Topics Concern   Not on file  Social History Narrative   Caffeine; large soda daily (20oz).    Education : HS some college.   Working:  Administrator, sports.    Social Determinants of Health   Financial Resource Strain: Not on file  Food Insecurity: No Food Insecurity (07/21/2021)   Hunger Vital Sign    Worried About Running Out of Food in the Last Year: Never true    [place] in the Last Year: Never true  Transportation Needs: No Transportation Needs (07/21/2021)   PRAPARE - Hydrologist (Medical): No    Lack of Transportation (Non-Medical): No  Physical Activity: Not on file  Stress: Not on file  Social Connections: Not on file    Her Allergies Are:  Allergies  Allergen Reactions   Amoxicillin Hives   Cefuroxime Axetil Hives   Hydrocodone Itching   Levofloxacin Other (See Comments)    tendonitis   Morphine Itching   Oxycodone-Acetaminophen Itching   Penicillins Hives    Has patient had a PCN reaction causing immediate rash, facial/tongue/throat swelling, SOB or lightheadedness with hypotension: Yes Has patient had a PCN reaction causing severe rash involving mucus membranes or skin necrosis: No Has patient had a PCN reaction that required hospitalization: No Has patient had a PCN reaction occurring within the last 10 years: No If all of the above answers are "NO", then may proceed with Cephalosporin use.    Propoxyphene Itching  :   Her Current Medications Are:  Outpatient Encounter Medications as of 08/08/2022  Medication Sig   albuterol (PROVENTIL) (2.5 MG/3ML) 0.083% nebulizer solution Take 3 mLs (2.5 mg total) by nebulization every 4 (four) hours as needed for wheezing or shortness of breath (coughing fits).   azelastine (ASTELIN) 0.1 % nasal spray Place 1-2 sprays into both nostrils 2 (two) times daily as needed (nasal drainage). Use in  each nostril as directed   Fluticasone Furoate-Vilanterol (BREO ELLIPTA IN) Inhale into the lungs.   montelukast (SINGULAIR) 10 MG tablet Take 1 tablet (10 mg total) by mouth at bedtime.   mupirocin ointment (BACTROBAN) 2 % Apply 1 Application topically daily.   sertraline (ZOLOFT) 25 MG tablet Take 1 tablet (25 mg total) by mouth daily.   Spacer/Aero-Holding Chambers (EQ SPACE CHAMBER ANTI-STATIC) DEVI Inhale into the lungs as directed.   SUMAtriptan (IMITREX) 50 MG tablet TAKE 1 TABLET BY MOUTH ONCE AS NEEDED FOR UP TO 15 DAYS FOR  MIGRAINE. MAY REPEAT IN 2 HOURS IF HEADACHE PERSISTS OR RECURS.   Tiotropium Bromide Monohydrate (SPIRIVA RESPIMAT) 1.25 MCG/ACT AERS Inhale 2 puffs into the lungs daily.   VENTOLIN HFA 108 (90 Base) MCG/ACT inhaler INHALE 1 TO 2 PUFFS BY MOUTH EVERY 6 HOURS AS NEEDED FOR WHEEZING FOR SHORTNESS OF BREATH   [DISCONTINUED] ondansetron (ZOFRAN-ODT) 4 MG disintegrating tablet Take 1 tablet (4 mg total) by mouth every 8 (eight) hours as needed for nausea or vomiting. (Patient not taking: Reported on 08/08/2022)   No facility-administered encounter medications on file as of 08/08/2022.  :   Review of Systems:  Out of a complete 14 point review of systems, all are reviewed and negative with the exception of these symptoms as listed below:  Review of Systems  Neurological:        Snoring loudly, sleep talking, and will occasionally stop breathing, daytime somnolence.  No prior Sleep study. ESS 16   FSS 44.    Objective:  Neurological Exam  Physical Exam Physical Examination:   Vitals:   08/08/22 1244  BP: 119/82  Pulse: 85    General Examination: The patient is a very pleasant 46 y.o. female in no acute distress. She appears well-developed and well-nourished and well groomed.   HEENT: Normocephalic, atraumatic, pupils are equal, round and reactive to light, extraocular tracking is good without limitation to gaze excursion or nystagmus noted. Hearing is grossly  intact. Face is symmetric with normal facial animation. Speech is clear with no dysarthria noted. There is no hypophonia. There is no lip, neck/head, jaw or voice tremor. Neck is supple with full range of passive and active motion. There are no carotid bruits on auscultation. Oropharynx exam reveals: mild mouth dryness, good dental hygiene and mild airway crowding, due to smaller airway entry. Mallampati is class I. Tongue protrudes centrally and palate elevates symmetrically. Tonsils are absent. Neck size is 14.75 inches. She has a minimal overbite. Nasal inspection reveals no significant nasal mucosal bogginess or redness and no septal deviation.   Chest: Clear to auscultation without wheezing, rhonchi or crackles noted.  Heart: S1+S2+0, regular and normal without murmurs, rubs or gallops noted.   Abdomen: Soft, non-tender and non-distended.   Extremities: There is no pitting edema in the distal lower extremities bilaterally.   Skin: Warm and dry without trophic changes noted.   Musculoskeletal: exam reveals no obvious joint deformities, L ankle in elastic support brace.    Neurologically:  Mental status: The patient is awake, alert and oriented in all 4 spheres. Her immediate and remote memory, attention, language skills and fund of knowledge are appropriate. There is no evidence of aphasia, agnosia, apraxia or anomia. Speech is clear with normal prosody and enunciation. Thought process is linear. Mood is normal and affect is normal.  Cranial nerves II - XII are as described above under HEENT exam.  Motor exam: Normal bulk, strength and tone is noted. There is no obvious tremor. Fine motor skills and coordination: grossly intact.  Cerebellar testing: No dysmetria or intention tremor. There is no truncal or gait ataxia.  Sensory exam: intact to light touch in the upper and lower extremities.  Gait, station and balance: She stands easily. She walks slowly, on crutches.   Assessment and Plan:   In summary, Keilyn Haggard is a very pleasant 46 y.o.-year old female with an underlying medical history of anemia, asthma, eczema, reflux disease, recurrent headaches, and obesity, whose history and physical exam are concerning for sleep disordered breathing,  supporting a current working diagnosis of unspecified sleep apnea, with the main differential diagnoses of obstructive sleep apnea (OSA) versus upper airway resistance syndrome (UARS) versus central sleep apnea (CSA), or mixed sleep apnea. A laboratory attended sleep study is considered gold standard for evaluation of sleep disordered breathing and is recommended at this time and clinically justified.   I had a long chat with the patient about my findings and the diagnosis of sleep apnea, particularly OSA, its prognosis and treatment options. We talked about medical/conservative treatments, surgical interventions and non-pharmacological approaches for symptom control. I explained, in particular, the risks and ramifications of untreated moderate to severe OSA, especially with respect to developing cardiovascular disease down the road, including congestive heart failure (CHF), difficult to treat hypertension, cardiac arrhythmias (particularly A-fib), neurovascular complications including TIA, stroke and dementia. Even type 2 diabetes has, in part, been linked to untreated OSA. Symptoms of untreated OSA may include (but may not be limited to) daytime sleepiness, nocturia (i.e. frequent nighttime urination), memory problems, mood irritability and suboptimally controlled or worsening mood disorder such as depression and/or anxiety, lack of energy, lack of motivation, physical discomfort, as well as recurrent headaches, especially morning or nocturnal headaches. We talked about the importance of maintaining a healthy lifestyle and striving for healthy weight. In addition, we talked about the importance of striving for and maintaining good sleep hygiene. I  recommended the following at this time: sleep study.  I outlined the differences between a laboratory attended sleep study which is considered more comprehensive and accurate over the option of a home sleep test (HST); the latter may lead to underestimation of sleep disordered breathing in some instances and does not help with diagnosing upper airway resistance syndrome and is not accurate enough to diagnose primary central sleep apnea typically. I explained the different sleep test procedures to the patient in detail and also outlined possible surgical and non-surgical treatment options of OSA, including the use of a pressure airway pressure (PAP) device (ie CPAP, AutoPAP/APAP or BiPAP in certain circumstances), a custom-made dental device (aka oral appliance, which would require a referral to a specialist dentist or orthodontist typically, and is generally speaking not considered a good choice for patients with full dentures or edentulous state), upper airway surgical options, such as traditional UPPP (which is not considered a first-line treatment) or the Inspire device (hypoglossal nerve stimulator, which would involve a referral for consultation with an ENT surgeon, after careful selection, following inclusion criteria). I explained the PAP treatment option to the patient in detail, as this is generally considered first-line treatment.  The patient indicated that she would be willing to try PAP therapy, if the need arises. I explained the importance of being compliant with PAP treatment, not only for insurance purposes but primarily to improve patient's symptoms symptoms, and for the patient's long term health benefit, including to reduce Her cardiovascular risks longer-term.    We will pick up our discussion about the next steps and treatment options after testing.  We will keep her posted as to the test results by phone call and/or MyChart messaging where possible.  We will plan to follow-up in sleep  clinic accordingly as well.  I answered all her questions today and the patient was in agreement.   I encouraged her to call with any interim questions, concerns, problems or updates or email [place] through [place].  Generally speaking, sleep test authorizations may take up to 2 weeks, sometimes less, sometimes longer, the patient is encouraged to  get in touch with [place] if they do not hear back from the sleep lab staff directly within the next 2 weeks.  Thank you very much for allowing me to participate in the care of this nice patient. If I can be of any further assistance to you please do not hesitate to call me at [phone].  Sincerely,   Star Age, MD, PhD

## 2022-08-09 ENCOUNTER — Other Ambulatory Visit: Payer: Self-pay

## 2022-08-09 ENCOUNTER — Encounter (HOSPITAL_BASED_OUTPATIENT_CLINIC_OR_DEPARTMENT_OTHER): Payer: Self-pay | Admitting: Physical Therapy

## 2022-08-09 ENCOUNTER — Ambulatory Visit (HOSPITAL_BASED_OUTPATIENT_CLINIC_OR_DEPARTMENT_OTHER): Payer: BC Managed Care – PPO | Admitting: Physical Therapy

## 2022-08-09 DIAGNOSIS — M25572 Pain in left ankle and joints of left foot: Secondary | ICD-10-CM

## 2022-08-09 DIAGNOSIS — R262 Difficulty in walking, not elsewhere classified: Secondary | ICD-10-CM

## 2022-08-09 DIAGNOSIS — R600 Localized edema: Secondary | ICD-10-CM | POA: Diagnosis not present

## 2022-08-09 DIAGNOSIS — M25672 Stiffness of left ankle, not elsewhere classified: Secondary | ICD-10-CM | POA: Diagnosis not present

## 2022-08-09 MED ORDER — VENTOLIN HFA 108 (90 BASE) MCG/ACT IN AERS: INHALATION_SPRAY | RESPIRATORY_TRACT | 0 refills | Status: DC

## 2022-08-09 NOTE — Telephone Encounter (Signed)
Patient called needing a refill for the ventolin inhaler and I sent it in to the [facility] in [facility].

## 2022-08-09 NOTE — Therapy (Signed)
OUTPATIENT PHYSICAL THERAPY LOWER EXTREMITY TREATMENT   Patient Name: Erin Collins MRN: [id number] DOB:09-12-1976, 46 y.o., female Today's Date: 08/09/2022   PT End of Session - 08/09/22 0741     Visit Number 10    Number of Visits 25    Date for PT Re-Evaluation 09/09/22    Authorization Type BCBS    PT Start Time [phone]    PT Stop Time 0820    PT Time Calculation (min) 42 min    Behavior During Therapy [facility] for tasks assessed/performed              Past Medical History:  Diagnosis Date   Anemia    Angio-edema    Asthma    Basal cell carcinoma ~ 1999   collarbone   Blood clot in vein ~ 2008   "left breast"    Complication of anesthesia    "I have trouble waking up afterwards" (12/18/2017)   Diarrhea 04/01/2019   Eczema    Family history of adverse reaction to anesthesia    "mom has trouble waking up afterwards" (12/18/2017)   Fluttering heart    "comes and goes" (12/18/2017)   GERD (gastroesophageal reflux disease)    Headache    "weekly" (12/18/2017)   History of kidney stones    Recurrent upper respiratory infection (URI)    Seizures ([place]) 1980 X 1   "bland stare"   Torticollis    Vertigo    Past Surgical History:  Procedure Laterality Date   ANKLE SURGERY Left 2023   [facility] Right ~ 1999   "collarbone"   DILATION AND CURETTAGE OF UTERUS  2005   LAPAROSCOPIC ENDOMETRIOSIS FULGURATION  2005   NASAL SEPTOPLASTY W/ TURBINOPLASTY Bilateral 2014   [place]   TUBAL LIGATION Right 2005   TYMPANOSTOMY TUBE PLACEMENT     Patient Active Problem List   Diagnosis Date Noted   Subacute cough 07/25/2022   Depression 07/25/2022   Urine discoloration 07/20/2022   HSV infection 04/28/2022   Headache    Snoring    Insomnia 08/19/2021   Not well controlled moderate persistent asthma 04/16/2021   Sinusitis 10/08/2020   Osteoporosis 02/04/2020   Seasonal and perennial allergic  rhinoconjunctivitis 12/31/2018   Other atopic dermatitis 11/28/2018   Multiple drug allergies 11/28/2018   Healthcare maintenance 05/06/2018    PCP: Starlyn Skeans, MD  REFERRING PROVIDER: Criselda Peaches, DPM  REFERRING DIAG:  [phone] (ICD-10-CM) - Gastrocnemius equinus of left lower extremity  M92.62 (ICD-10-CM) - Haglund's deformity of left heel  M76.62 (ICD-10-CM) - Tendonitis, Achilles, left  Z98.890 (ICD-10-CM) - Status post surgery  Post op Achilles reconstruction and repair 04/29/22. Can be toe touch WB in boot until 06/27/22. Then can progress to full WB in boot with wedges. OK to come out of boot for exercises gradually   THERAPY DIAG:  Pain in left ankle and joints of left foot  Stiffness of left ankle, not elsewhere classified  Difficulty in walking, not elsewhere classified  Localized edema  Rationale for Evaluation and Treatment Rehabilitation  ONSET DATE: DOS 04/29/22  SUBJECTIVE:   SUBJECTIVE STATEMENT: Pt reports she has increased stiffness since attending conference last week. She did have a WC to use, but still did a lot of moving around with crutches. She states she is going up stairs with one crutch and railing now.     PERTINENT HISTORY: Chronic h/o foot  issues  PAIN:  Are you having pain? Yes: NPRS scale: 3/10 Pain location: Lt forefoot at MP joints Pain description: achy, tight Aggravating factors: WB, supination Relieving factors: ice  PRECAUTIONS: Other: ankle WB  WEIGHT BEARING RESTRICTIONS  Can be toe touch WB in boot until 06/27/22. Then can progress to full WB in boot with wedges. OK to come out of boot for exercises gradually 07/22/22:  can be out of boot completely with gait.    FALLS:  Has patient fallen in last 6 months? Yes. 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: Independent  PATIENT GOALS hiking (was walking 3-5 mi/day), bike rides (did 100 mile bike rides)   OBJECTIVE:    PATIENT SURVEYS:  FOTO  20  COGNITION:  Overall cognitive status: Within functional limits for tasks assessed     SENSATION: Some numbness on post heel  EDEMA:  Circumferential: Left 29 cm around maleoli; Right 26 cm    PALPATION: Minimal mobility through foot and ankle due to edema  LOWER EXTREMITY ROM:  Active/passive ROM Left eval LEFT 7/12 LEFT 7/18 LEFT 7/25 LEFT Left 8/4 Left 8/11  Ankle dorsiflexion -32/-25 -24 -16 -17/-10 -6 -15 -15/-8  Ankle plantarflexion         Ankle inversion         Ankle eversion          (Blank rows = not tested)  LOWER EXTREMITY MMT: not tested at eval   TODAY'S TREATMENT: 8/22:  Prior to entering water: Manual therapy to decrease fascial restrictions and improve ROM-  IASTM to L calf and hamstring Pr[place] / seated Lt ankle ROM - circles and PF/DF with knee straight and flexed  Pt seen for aquatic therapy today.  Treatment took place in water 3.25-4 ft, 3" depth at the [facility] pool. Temp of water was 92.  Pt entered/exited the pool via stairs with step-to pattern with supervision with bilat rail.  * Holding wall: slow weight shifts side to side into Lt SLS, forward and backward in staggered stance with each foot forward *in water just above 4 ft holding yellow noodle in front of her-    -  forward / backward gait, cues for heel strike, even stance time and length   - side stepping  * Holding wall: squats;  heel/toe raises; runners stretch - with straight and flexed knee (VERY limited) * Holding yellow hand buoys - forward walking split squats (cues to keep heel down when foot in front) * straddling yellow noodle in deeper water: cycling, hip abdct/add, cc ski - 2 rounds with feet suspended *  Lt foot on 2nd step with forward lunge for DF stretch, range to tolerance, holding 10sec x 5; Forward L step ups x 10 with   BUE, cues for upright trunk and less use of UE, R step downs x 10, retro step up LLE.  * return to walking forward with cues for  even step length, and push off through toes   * after dried off reviewed use of NuStep and stationary bicycle for work on ankle ROM   Pt requires the buoyancy and hydrostatic pressure of water for support, and to offload joints by unweighting joint load by at least 50 % in navel deep water and by at least 75-80% in chest to neck deep water.  Viscosity of the water is needed for resistance of strengthening. Water current perturbations provides challenge to standing balance requiring increased core activation.   PATIENT EDUCATION:  Education details: Anatomy of condition, verbally reviewed HEP, exercise form/rationale  Person educated: Patient Education method: Explanation, Demonstration, Tactile cues, Verbal cues, and Handouts Education comprehension: verbalized understanding, returned demonstration, verbal cues required, tactile cues required, and needs further education   HOME EXERCISE PROGRAM: [id number] prefers digital  ASSESSMENT:  CLINICAL IMPRESSION: Positive response to IASTM prior to entering water.  Pt continues to be limited with Lt ankle DF and is observed flexing through knee and MP joints early in stance to toe off.  Pt reported reduction of tightness at end of session.  She reported increased soreness in medial L ankle  during session reduced with suspended cycling in deeper water. Pt progressing gradually towards goals. Pt will benefit from continued PT intervention to maximize functional mobility.    OBJECTIVE IMPAIRMENTS Abnormal gait, decreased activity tolerance, decreased mobility, difficulty walking, decreased ROM, decreased strength, increased edema, increased muscle spasms, impaired sensation, improper body mechanics, postural dysfunction, and pain.   ACTIVITY LIMITATIONS carrying, standing, squatting, stairs, transfers, bathing, dressing, locomotion level, and caring for others  PARTICIPATION LIMITATIONS: meal prep, cleaning, laundry, driving, community activity,  and occupation  PERSONAL FACTORS  asthma, osteoporosis  are also affecting patient's functional outcome.   REHAB POTENTIAL: Good  CLINICAL DECISION MAKING: Stable/uncomplicated  EVALUATION COMPLEXITY: Low   GOALS: Goals reviewed with patient? Yes  SHORT TERM GOALS: Target date: 07/05/2022  Ankle DF to -10 or better Baseline: Goal status: achieved  2.  Demo proper gait pattern in alignment with WB precautions Baseline:  Goal status: achieved  3.  Resting pain <=7/10 Baseline:  Goal status: achieved   LONG TERM GOALS: Target date: 09/06/2022   Pt will demo ability to perform at least 5 single leg heel raises with UE support for balance Baseline:  Goal status: INITIAL  2.  Pt will return to biking Baseline:  Goal status: INITIAL  3.  Pt will return to hiking on trails with moderate difficulty Baseline:  Goal status: INITIAL  4.  Pt to meet stated FOTO goal Baseline:  Goal status: INITIAL  5.  Gross pain with ADLs <=3/10 Baseline:  Goal status: INITIAL  6.  Demo normalized gait pattern Baseline:  Goal status: INITIAL   PLAN: PT FREQUENCY: 1-2x/week  PT DURATION: 12 weeks  PLANNED INTERVENTIONS: Therapeutic exercises, Therapeutic activity, Neuromuscular re-education, Balance training, Gait training, Patient/Family education, Joint mobilization, Stair training, Aquatic Therapy, Dry Needling, Spinal mobilization, Cryotherapy, Moist heat, Taping, Vasopneumatic device, Manual therapy, and Re-evaluation  PLAN FOR NEXT SESSION: assess response to IASTM; continued aquatics with focus on normalizing gait and improving ROM  [employer], PTA 08/09/22 8:29 AM

## 2022-08-16 ENCOUNTER — Encounter (HOSPITAL_BASED_OUTPATIENT_CLINIC_OR_DEPARTMENT_OTHER): Payer: Self-pay | Admitting: Physical Therapy

## 2022-08-16 ENCOUNTER — Ambulatory Visit (HOSPITAL_BASED_OUTPATIENT_CLINIC_OR_DEPARTMENT_OTHER): Payer: BC Managed Care – PPO | Admitting: Physical Therapy

## 2022-08-16 DIAGNOSIS — M25672 Stiffness of left ankle, not elsewhere classified: Secondary | ICD-10-CM | POA: Diagnosis not present

## 2022-08-16 DIAGNOSIS — R600 Localized edema: Secondary | ICD-10-CM

## 2022-08-16 DIAGNOSIS — M25572 Pain in left ankle and joints of left foot: Secondary | ICD-10-CM

## 2022-08-16 DIAGNOSIS — R262 Difficulty in walking, not elsewhere classified: Secondary | ICD-10-CM

## 2022-08-16 NOTE — Therapy (Signed)
OUTPATIENT PHYSICAL THERAPY LOWER EXTREMITY TREATMENT   Patient Name: Erin Collins MRN: [id number] DOB:[birth year], 46 y.o., female Today's Date: 08/16/2022   PT End of Session - 08/16/22 0747     Visit Number 11    Number of Visits 25    Date for PT Re-Evaluation 09/09/22    Authorization Type BCBS    PT Start Time 0733    PT Stop Time 0814    PT Time Calculation (min) 41 min    Activity Tolerance Patient tolerated treatment well    Behavior During Therapy [facility] for tasks assessed/performed              Past Medical History:  Diagnosis Date   Anemia    Angio-edema    Asthma    Basal cell carcinoma ~ 1999   collarbone   Blood clot in vein ~ 2008   "left breast"    Complication of anesthesia    "I have trouble waking up afterwards" (12/18/2017)   Diarrhea 04/01/2019   Eczema    Family history of adverse reaction to anesthesia    "mom has trouble waking up afterwards" (12/18/2017)   Fluttering heart    "comes and goes" (12/18/2017)   GERD (gastroesophageal reflux disease)    Headache    "weekly" (12/18/2017)   History of kidney stones    Recurrent upper respiratory infection (URI)    Seizures ([place]) 1980 X 1   "bland stare"   Torticollis    Vertigo    Past Surgical History:  Procedure Laterality Date   ANKLE SURGERY Left 2023   [facility] Right ~ 1999   "collarbone"   DILATION AND CURETTAGE OF UTERUS  2005   LAPAROSCOPIC ENDOMETRIOSIS FULGURATION  2005   NASAL SEPTOPLASTY W/ TURBINOPLASTY Bilateral 2014   [place]   TUBAL LIGATION Right 2005   TYMPANOSTOMY TUBE PLACEMENT     Patient Active Problem List   Diagnosis Date Noted   Subacute cough 07/25/2022   Depression 07/25/2022   Urine discoloration 07/20/2022   HSV infection 04/28/2022   Headache    Snoring    Insomnia 08/19/2021   Not well controlled moderate persistent asthma 04/16/2021   Sinusitis 10/08/2020    Osteoporosis 02/04/2020   Seasonal and perennial allergic rhinoconjunctivitis 12/31/2018   Other atopic dermatitis 11/28/2018   Multiple drug allergies 11/28/2018   Healthcare maintenance 05/06/2018    PCP: Starlyn Skeans, MD  REFERRING PROVIDER: Criselda Peaches, DPM  REFERRING DIAG:  [phone] (ICD-10-CM) - Gastrocnemius equinus of left lower extremity  M92.62 (ICD-10-CM) - Haglund's deformity of left heel  M76.62 (ICD-10-CM) - Tendonitis, Achilles, left  Z98.890 (ICD-10-CM) - Status post surgery  Post op Achilles reconstruction and repair 04/29/22. Can be toe touch WB in boot until 06/27/22. Then can progress to full WB in boot with wedges. OK to come out of boot for exercises gradually   THERAPY DIAG:  Pain in left ankle and joints of left foot  Stiffness of left ankle, not elsewhere classified  Difficulty in walking, not elsewhere classified  Localized edema  Rationale for Evaluation and Treatment Rehabilitation  ONSET DATE: DOS 04/29/22  SUBJECTIVE:   SUBJECTIVE STATEMENT: Pt reports she has been working on standing more, with weight shifts. She walked a short distance around office without crutches yesterday, "20 ft total".  She plans to come back to [place] to go to gym this afternoon.  PERTINENT HISTORY: Chronic h/o foot issues  PAIN:  Are you having pain? Yes: NPRS scale: 2-3/10 Pain location: Lt ankle Pain description: achy, tight Aggravating factors: prolonged standing walking Relieving factors: ice  PRECAUTIONS: Other: ankle WB  WEIGHT BEARING RESTRICTIONS  Can be toe touch WB in boot until 06/27/22. Then can progress to full WB in boot with wedges. OK to come out of boot for exercises gradually 07/22/22:  can be out of boot completely with gait.    FALLS:  Has patient fallen in last 6 months? Yes. 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: Independent  PATIENT GOALS hiking (was walking 3-5 mi/day), bike rides (did 100 mile bike  rides)   OBJECTIVE:    PATIENT SURVEYS:  FOTO 20  COGNITION:  Overall cognitive status: Within functional limits for tasks assessed     SENSATION: Some numbness on post heel  EDEMA:  Circumferential: Left 29 cm around maleoli; Right 26 cm    PALPATION: Minimal mobility through foot and ankle due to edema  LOWER EXTREMITY ROM:  Active/passive ROM Left eval LEFT 7/12 LEFT 7/18 LEFT 7/25 LEFT Left 8/4 Left 8/11 Left  8/29  Ankle dorsiflexion -32/-25 -24 -16 -17/-10 -6 -15 -15/-8 -4/0  Ankle plantarflexion        41  Ankle inversion        24  Ankle eversion        10   (Blank rows = not tested)  LOWER EXTREMITY MMT: not tested at eval   TODAY'S TREATMENT: Pt seen for aquatic therapy today.  Treatment took place in water 3.5-4.75 ft depth at the [employer] [facility] pool. Temp of water was 91.  Pt entered/exited the pool via stairs with step-to pattern with supervision with bilat rail.  * Holding wall: slow weight shifts side to side into Lt SLS, forward and backward in staggered stance with each foot forward, rolling through foot; soleus stretch *in 4 ft unsupported:   -  forward / backward gait, cues for heel strike, even stance time and length   - side stepping    -walking forward lunges   - return to forward walking with focus on hip ext and push off through toes for toe off * Lt foot on 2nd step with forward lunge for DF stretch, range to tolerance, holding 10sec x 4; heel raises off of step x 5, on ground x 5, off step x 5 * Lt soleus and gastroc stretch holding wall, cues for form * straddling yellow noodle in deeper water: cycling, hip abdct/add, cc ski - 2 rounds with feet suspended * L soleus and gastroc stretch holding wall *measurements taken in long sitting on hot tub surround    Pt requires the buoyancy and hydrostatic pressure of water for support, and to offload joints by unweighting joint load by at least 50 % in navel deep water and by at  least 75-80% in chest to neck deep water.  Viscosity of the water is needed for resistance of strengthening. Water current perturbations provides challenge to standing balance requiring increased core activation.   PATIENT EDUCATION:  Education detailsNurse, children's of condition, verbally reviewed HEP, exercise form/rationale  Person educated: Patient Education method: Explanation, Demonstration, Tactile cues, Verbal cues, and Handouts Education comprehension: verbalized understanding, returned demonstration, verbal cues required, tactile cues required, and needs further education   HOME EXERCISE PROGRAM: [id number]- prefers digital  ASSESSMENT:  CLINICAL IMPRESSION: Pt making gradual gains with Lt ankle ROM (see ROM chart).  Repeated ankle stretches throughout session with gait.  Pt reported reduction of tightness at end of session.  She tolerated exercises better than previous session, without increase in pain. Discussed progressing to single crutch in office/home prior to walking without crutches to continue work on normalizing gait.   Pt progressing gradually towards goals. Pt will benefit from continued PT intervention to maximize functional mobility.    OBJECTIVE IMPAIRMENTS Abnormal gait, decreased activity tolerance, decreased mobility, difficulty walking, decreased ROM, decreased strength, increased edema, increased muscle spasms, impaired sensation, improper body mechanics, postural dysfunction, and pain.   ACTIVITY LIMITATIONS carrying, standing, squatting, stairs, transfers, bathing, dressing, locomotion level, and caring for others  PARTICIPATION LIMITATIONS: meal prep, cleaning, laundry, driving, community activity, and occupation  PERSONAL FACTORS  asthma, osteoporosis  are also affecting patient's functional outcome.   REHAB POTENTIAL: Good  CLINICAL DECISION MAKING: Stable/uncomplicated  EVALUATION COMPLEXITY: Low   GOALS: Goals reviewed with patient? Yes  SHORT TERM  GOALS: Target date: 07/05/2022  Ankle DF to -10 or better Baseline: Goal status: achieved  2.  Demo proper gait pattern in alignment with WB precautions Baseline:  Goal status: achieved  3.  Resting pain <=7/10 Baseline:  Goal status: achieved   LONG TERM GOALS: Target date: 09/06/2022   Pt will demo ability to perform at least 5 single leg heel raises with UE support for balance Baseline:  Goal status: INITIAL  2.  Pt will return to biking Baseline:  Goal status: INITIAL  3.  Pt will return to hiking on trails with moderate difficulty Baseline:  Goal status: INITIAL  4.  Pt to meet stated FOTO goal Baseline:  Goal status: INITIAL  5.  Gross pain with ADLs <=3/10 Baseline:  Goal status: INITIAL  6.  Demo normalized gait pattern Baseline:  Goal status: INITIAL   PLAN: PT FREQUENCY: 1-2x/week  PT DURATION: 12 weeks  PLANNED INTERVENTIONS: Therapeutic exercises, Therapeutic activity, Neuromuscular re-education, Balance training, Gait training, Patient/Family education, Joint mobilization, Stair training, Aquatic Therapy, Dry Needling, Spinal mobilization, Cryotherapy, Moist heat, Taping, Vasopneumatic device, Manual therapy, and Re-evaluation  PLAN FOR NEXT SESSION:  focus on normalizing gait and improving ROM  Kerin Perna, PTA 08/16/22 9:25 AM

## 2022-08-20 ENCOUNTER — Other Ambulatory Visit: Payer: Self-pay | Admitting: Student

## 2022-08-20 DIAGNOSIS — F32A Depression, unspecified: Secondary | ICD-10-CM

## 2022-08-20 DIAGNOSIS — F320 Major depressive disorder, single episode, mild: Secondary | ICD-10-CM

## 2022-08-22 NOTE — Progress Notes (Signed)
Follow Up Note  RE: Erin Collins MRN: [id number] DOB: 1976/09/05 Date of Office Visit: 08/23/2022  Referring provider: Maudie Mercury, MD Primary care provider: Starlyn Skeans, MD  Chief Complaint: Asthma, Allergic Rhinitis , and Eczema  History of Present Illness: I had the pleasure of seeing Erin Collins for a follow up visit at the [facility] on 08/23/2022. She is a 46 y.o. female, who is being followed for asthma, allergic rhinoconjunctivitis, atopic dermatitis and multiple drug allergies. Her previous allergy office visit was on 12/20/2021 with Dr. Maudie Mercury. Today is a regular follow up visit.  Asthma Went to PCP in August as her asthma was flaring. She has a constant cough - worse in the mornings and at night wit discolored phlegm. No recent antibiotics.  Currently on Breo 251mg 1 puff once a day, Spiriva 1.247m 2 puffs once a day and Singulair daily. Using albuterol throughout the day - about 10 times per day for the past few weeks.  No recent prednisone and does not like to take prednisone.  Patient just took albuterol prior to today's visit.   Seasonal and perennial allergic rhinoconjunctivitis Taking zyrtec, Singulair, Flonase sensamist.  3 cats at home now.    Other atopic dermatitis Stable.   Had ankle surgery in May and started on Zoloft.   Assessment and Plan: Erin Collins a 89.o. female with: Not well controlled moderate persistent asthma Past history - Diagnosed with asthma over 19 years ago. Patient moved in with her parents who have cats and dogs at home during this time. Did not tolerate Trelegy, Breztri no as effective. Flares in the spring and fall.  Interim history - symptoms flaring requiring daily albuterol use and missed appointment due to ankle surgery. No longer trying to get pregnant.  Today's spirometry was normal - just used albuterol. Start Fasenra or Nucala injections in the office - our biologics coordinator will be in touch with  you. Declined oral prednisone.  Daily controller medication(s):  Continue Breo 20037m1 puff once a day and rinse mouth after each use.  Continue Spiriva 1.65m54m puff once a day.  Continue Singulair '10mg'$  daily During upper respiratory infections/flares:  Start budesonide (Pulmicort) 0.'5mg'$  nebulizer for 1-2 weeks until your breathing symptoms return to baseline.  Pretreat with albuterol 2 puffs or albuterol nebulizer.  If you need to use your albuterol nebulizer machine back to back within 15-30 minutes with no relief then please go to the ER/urgent care for further evaluation.  May use albuterol rescue inhaler 2 puffs or nebulizer every 4 to 6 hours as needed for shortness of breath, chest tightness, coughing, and wheezing. May use albuterol rescue inhaler 2 puffs 5 to 15 minutes prior to strenuous physical activities. Monitor frequency of use.  Get spirometry at next visit.  Acute sinusitis Start doxycycline '100mg'$  twice a day for 10 days.  Seasonal and perennial allergic rhinoconjunctivitis Past history - Perennial rhinoconjunctivitis symptoms for the past 20 years with worsening in the spring and fall.  Allergy immunotherapy for 10 years with good benefit. 2019 skin testing showed, positive to dust mites, cat, dog, grass and horse. 2 cats at home. 2022 bloodwork positive to dust mites, cat, dog, grass pollen. Borderline to cockroach, mouse and maple/box elder tree pollen.  Interim history - rhinitis symptoms flaring. Continue environmental control measures - especially regarding the cats. Continue Singulair (montelukast) '10mg'$  daily. Use over the counter antihistamines such as Zyrtec (cetirizine), Claritin (loratadine), Allegra (fexofenadine), or Xyzal (  levocetirizine) daily as needed. May take twice a day during allergy flares. May switch antihistamines every few months. Start Ryaltris (olopatadine + mometasone nasal spray combination) 1-2 sprays per nostril twice a day. Sample given. This  replaces your other nasal sprays. If this works well for you, then have [facility] ship the medication to your home - prescription already sent in.  Nasal saline spray (i.e., Simply Saline) or nasal saline lavage (i.e., NeilMed) is recommended as needed and prior to medicated nasal sprays. May use refresh eye drops.  Need asthma to be more stable before starting AIT.  Gastroesophageal reflux disease See handout for lifestyle and dietary modifications. Start famotidine '20mg'$  twice a day.  Multiple drug allergies Past history - reactions to amoxicillin, cefuroxime, hydrocodone, levofloxacin, morphine, oxycodone, and propoxyphene in the past.  Continue to avoid all these medications.   Other atopic dermatitis Past history - Eucrisa not effective.  Continue proper skin care measures.  May use triamcinolone 0.1% ointment twice a day as needed for the hands. Do not use on the face, neck, armpits or groin area. Do not use more than 3 weeks in a row.   Return in about 2 months (around 10/23/2022).  Meds ordered this encounter  Medications   budesonide (PULMICORT) 0.5 MG/2ML nebulizer solution    Sig: Take 2 mLs (0.5 mg total) by nebulization in the morning and at bedtime.    Dispense:  120 mL    Refill:  2   Tiotropium Bromide Monohydrate (SPIRIVA RESPIMAT) 1.25 MCG/ACT AERS    Sig: Inhale 2 puffs into the lungs daily.    Dispense:  4 g    Refill:  5   fluticasone furoate-vilanterol (BREO ELLIPTA) 200-25 MCG/ACT AEPB    Sig: Inhale 1 puff into the lungs daily. Rinse mouth after each use.    Dispense:  60 each    Refill:  5   Olopatadine-Mometasone (RYALTRIS) 665-25 MCG/ACT SUSP    Sig: Place 1-2 sprays into the nose in the morning and at bedtime.    Dispense:  29 g    Refill:  5    [phone]   famotidine (PEPCID) 20 MG tablet    Sig: Take 1 tablet (20 mg total) by mouth 2 (two) times daily.    Dispense:  60 tablet    Refill:  3   doxycycline (MONODOX) 100 MG capsule    Sig: Take 1  capsule (100 mg total) by mouth 2 (two) times daily.    Dispense:  20 capsule    Refill:  0   Lab Orders  No laboratory test(s) ordered today    Diagnostics: Spirometry:  Tracings reviewed. Her effort: Good reproducible efforts. FVC: 3.00L FEV1: 2.34L, 80% predicted FEV1/FVC ratio: 78% Interpretation: Spirometry consistent with normal pattern.  Just took albuterol 2 puffs.  Please see scanned spirometry results for details.  Medication List:  Current Outpatient Medications  Medication Sig Dispense Refill   albuterol (PROVENTIL) (2.5 MG/3ML) 0.083% nebulizer solution Take 3 mLs (2.5 mg total) by nebulization every 4 (four) hours as needed for wheezing or shortness of breath (coughing fits). 75 mL 2   budesonide (PULMICORT) 0.5 MG/2ML nebulizer solution Take 2 mLs (0.5 mg total) by nebulization in the morning and at bedtime. 120 mL 2   doxycycline (MONODOX) 100 MG capsule Take 1 capsule (100 mg total) by mouth 2 (two) times daily. 20 capsule 0   famotidine (PEPCID) 20 MG tablet Take 1 tablet (20 mg total) by mouth 2 (two) times daily.  60 tablet 3   fluticasone furoate-vilanterol (BREO ELLIPTA) 200-25 MCG/ACT AEPB Inhale 1 puff into the lungs daily. Rinse mouth after each use. 60 each 5   montelukast (SINGULAIR) 10 MG tablet Take 1 tablet (10 mg total) by mouth at bedtime. 30 tablet 5   mupirocin ointment (BACTROBAN) 2 % Apply 1 Application topically daily. 30 g 0   Olopatadine-Mometasone (RYALTRIS) 665-25 MCG/ACT SUSP Place 1-2 sprays into the nose in the morning and at bedtime. 29 g 5   sertraline (ZOLOFT) 25 MG tablet Take 1 tablet (25 mg total) by mouth daily. 30 tablet 2   Spacer/Aero-Holding Chambers (EQ SPACE CHAMBER ANTI-STATIC) DEVI Inhale into the lungs as directed.     SUMAtriptan (IMITREX) 50 MG tablet TAKE 1 TABLET BY MOUTH ONCE AS NEEDED FOR UP TO 15 DAYS FOR MIGRAINE. MAY REPEAT IN 2 HOURS IF HEADACHE PERSISTS OR RECURS. 9 tablet 1   Tiotropium Bromide Monohydrate (SPIRIVA  RESPIMAT) 1.25 MCG/ACT AERS Inhale 2 puffs into the lungs daily. 4 g 5   VENTOLIN HFA 108 (90 Base) MCG/ACT inhaler INHALE 1 TO 2 PUFFS BY MOUTH EVERY 6 HOURS AS NEEDED FOR WHEEZING FOR SHORTNESS OF BREATH 18 g 0   No current facility-administered medications for this visit.   Allergies: Allergies  Allergen Reactions   Amoxicillin Hives   Cefuroxime Axetil Hives   Hydrocodone Itching   Levofloxacin Other (See Comments)    tendonitis   Morphine Itching   Oxycodone-Acetaminophen Itching   Penicillins Hives    Has patient had a PCN reaction causing immediate rash, facial/tongue/throat swelling, SOB or lightheadedness with hypotension: Yes Has patient had a PCN reaction causing severe rash involving mucus membranes or skin necrosis: No Has patient had a PCN reaction that required hospitalization: No Has patient had a PCN reaction occurring within the last 10 years: No If all of the above answers are "NO", then may proceed with Cephalosporin use.    Propoxyphene Itching   I reviewed her past medical history, social history, family history, and environmental history and no significant changes have been reported from her previous visit.  Review of Systems  Constitutional:  Negative for appetite change, chills, fatigue, fever and unexpected weight change.  HENT:  Positive for postnasal drip. Negative for congestion and rhinorrhea.   Eyes:  Negative for itching.  Respiratory:  Positive for cough, chest tightness, shortness of breath and wheezing.   Cardiovascular:  Negative for chest pain.  Gastrointestinal:  Negative for abdominal pain.  Genitourinary:  Negative for difficulty urinating.  Skin:  Negative for rash.  Allergic/Immunologic: Positive for environmental allergies. Negative for food allergies.    Objective: BP 110/70   Pulse 85   Resp 16   Ht '5\' 4"'$  (1.626 m)   Wt 195 lb (88.5 kg)   LMP 07/25/2022 (Exact Date) Comment: Pt started menstrual period today  SpO2 96%   BMI  33.47 kg/m  Body mass index is 33.47 kg/m. Physical Exam Vitals and nursing note reviewed.  Constitutional:      Appearance: Normal appearance. She is well-developed.  HENT:     Head: Normocephalic and atraumatic.     Right Ear: Tympanic membrane and external ear normal.     Left Ear: Tympanic membrane and external ear normal.     Nose:     Comments: Left side - green nasal discharge.    Mouth/Throat:     Mouth: Mucous membranes are moist.     Pharynx: Oropharynx is clear.  Eyes:  Conjunctiva/sclera: Conjunctivae normal.  Cardiovascular:     Rate and Rhythm: Normal rate and regular rhythm.     Heart sounds: Normal heart sounds. No murmur heard. Pulmonary:     Effort: Pulmonary effort is normal.     Breath sounds: Normal breath sounds. No wheezing, rhonchi or rales.  Musculoskeletal:     Cervical back: Neck supple.  Skin:    General: Skin is warm.     Findings: No rash.  Neurological:     Mental Status: She is alert and oriented to person, place, and time.  Psychiatric:        Behavior: Behavior normal.    Previous notes and tests were reviewed. The plan was reviewed with the patient/family, and all questions/concerned were addressed.  It was my pleasure to see Erin Collins today and participate in her care. Please feel free to contact me with any questions or concerns.  Sincerely,  Rexene Alberts, DO Allergy & Immunology  [facility] office: [place] office: [phone]

## 2022-08-23 ENCOUNTER — Ambulatory Visit: Payer: BC Managed Care – PPO | Admitting: Allergy

## 2022-08-23 ENCOUNTER — Encounter: Payer: Self-pay | Admitting: Allergy

## 2022-08-23 VITALS — BP 110/70 | HR 85 | Resp 16 | Ht 64.0 in | Wt 195.0 lb

## 2022-08-23 DIAGNOSIS — J019 Acute sinusitis, unspecified: Secondary | ICD-10-CM | POA: Insufficient documentation

## 2022-08-23 DIAGNOSIS — J4550 Severe persistent asthma, uncomplicated: Secondary | ICD-10-CM | POA: Diagnosis not present

## 2022-08-23 DIAGNOSIS — J302 Other seasonal allergic rhinitis: Secondary | ICD-10-CM | POA: Diagnosis not present

## 2022-08-23 DIAGNOSIS — H1010 Acute atopic conjunctivitis, unspecified eye: Secondary | ICD-10-CM

## 2022-08-23 DIAGNOSIS — J4540 Moderate persistent asthma, uncomplicated: Secondary | ICD-10-CM

## 2022-08-23 DIAGNOSIS — K219 Gastro-esophageal reflux disease without esophagitis: Secondary | ICD-10-CM | POA: Diagnosis not present

## 2022-08-23 DIAGNOSIS — L2089 Other atopic dermatitis: Secondary | ICD-10-CM

## 2022-08-23 DIAGNOSIS — H1013 Acute atopic conjunctivitis, bilateral: Secondary | ICD-10-CM

## 2022-08-23 DIAGNOSIS — Z889 Allergy status to unspecified drugs, medicaments and biological substances status: Secondary | ICD-10-CM

## 2022-08-23 DIAGNOSIS — J018 Other acute sinusitis: Secondary | ICD-10-CM

## 2022-08-23 MED ORDER — SPIRIVA RESPIMAT 1.25 MCG/ACT IN AERS: 2.0000 | INHALATION_SPRAY | Freq: Every day | RESPIRATORY_TRACT | 5 refills | Status: DC

## 2022-08-23 MED ORDER — DOXYCYCLINE MONOHYDRATE 100 MG PO CAPS
100.0000 mg | ORAL_CAPSULE | Freq: Two times a day (BID) | ORAL | 0 refills | Status: DC
Start: 2022-08-23 — End: 2022-10-24

## 2022-08-23 MED ORDER — RYALTRIS 665-25 MCG/ACT NA SUSP: 1.0000 | Freq: Two times a day (BID) | NASAL | 5 refills | Status: DC

## 2022-08-23 MED ORDER — FAMOTIDINE 20 MG PO TABS: 20.0000 mg | ORAL_TABLET | Freq: Two times a day (BID) | ORAL | 3 refills | Status: DC

## 2022-08-23 MED ORDER — FLUTICASONE FUROATE-VILANTEROL 200-25 MCG/ACT IN AEPB: 1.0000 | INHALATION_SPRAY | Freq: Every day | RESPIRATORY_TRACT | 5 refills | Status: DC

## 2022-08-23 MED ORDER — BUDESONIDE 0.5 MG/2ML IN SUSP: 0.5000 mg | Freq: Two times a day (BID) | RESPIRATORY_TRACT | 2 refills | Status: DC

## 2022-08-23 NOTE — Telephone Encounter (Signed)
Call to Pharmacy patient picked up on 07/29/2022.  Requesting 3 month supply.

## 2022-08-23 NOTE — Patient Instructions (Addendum)
Sinus infection/bronchitis Start doxycycline '100mg'$  twice a day for 10 days.  Moderate persistent asthma Start either Fasenra or Nucala injections - Tammy our biologics coordinator will be in touch with you. Daily controller medication(s):  Continue Breo 268mg 1 puff once a day and rinse mouth after each use.  Continue Spiriva 1.298m 2 puff once a day.  Continue Singulair '10mg'$  daily During upper respiratory infections/flares:  Start budesonide (Pulmicort) 0.'5mg'$  nebulizer for 1-2 weeks until your breathing symptoms return to baseline.  Pretreat with albuterol 2 puffs or albuterol nebulizer.  If you need to use your albuterol nebulizer machine back to back within 15-30 minutes with no relief then please go to the ER/urgent care for further evaluation.  May use albuterol rescue inhaler 2 puffs or nebulizer every 4 to 6 hours as needed for shortness of breath, chest tightness, coughing, and wheezing. May use albuterol rescue inhaler 2 puffs 5 to 15 minutes prior to strenuous physical activities. Monitor frequency of use.  Asthma control goals:  Full participation in all desired activities (may need albuterol before activity) Albuterol use two times or less a week on average (not counting use with activity) Cough interfering with sleep two times or less a month Oral steroids no more than once a year No hospitalizations    Seasonal and perennial allergic rhinoconjunctivitis 2022 bloodwork positive to dust mites, cat, dog, grass pollen. Borderline to cockroach, mouse and maple/box elder tree pollen.  Continue environmental control measures - especially regarding the cats. Continue Singulair (montelukast) '10mg'$  daily.  Use over the counter antihistamines such as Zyrtec (cetirizine), Claritin (loratadine), Allegra (fexofenadine), or Xyzal (levocetirizine) daily as needed. May take twice a day during allergy flares. May switch antihistamines every few months. Start Ryaltris (olopatadine + mometasone  nasal spray combination) 1-2 sprays per nostril twice a day. Sample given. This replaces your other nasal sprays. If this works well for you, then have [facility] ship the medication to your home - prescription already sent in.  Nasal saline spray (i.e., Simply Saline) or nasal saline lavage (i.e., NeilMed) is recommended as needed and prior to medicated nasal sprays. May use refresh eye drops.    Other atopic dermatitis Continue proper skin care measures. May use triamcinolone 0.1% ointment twice a day as needed for the hands. Do not use on the face, neck, armpits or groin area. Do not use more than 3 weeks in a row.    Multiple drug allergies Continue to avoid medications on your allergy list.    Heartburn See handout for lifestyle and dietary modifications. Start famotidine '20mg'$  twice a day.  Follow up in 2 months or sooner if needed.

## 2022-08-23 NOTE — Assessment & Plan Note (Signed)
Past history - Perennial rhinoconjunctivitis symptoms for the past 20 years with worsening in the spring and fall.  Allergy immunotherapy for 10 years with good benefit. 2019 skin testing showed, positive to dust mites, cat, dog, grass and horse. 2 cats at home. 2022 bloodwork positive to dust mites, cat, dog, grass pollen. Borderline to cockroach, mouse and maple/box elder tree pollen.  Interim history - rhinitis symptoms flaring.  Continue environmental control measures - especially regarding the cats.  Continue Singulair (montelukast) '10mg'$  daily.  Use over the counter antihistamines such as Zyrtec (cetirizine), Claritin (loratadine), Allegra (fexofenadine), or Xyzal (levocetirizine) daily as needed. May take twice a day during allergy flares. May switch antihistamines every few months.  Start Ryaltris (olopatadine + mometasone nasal spray combination) 1-2 sprays per nostril twice a day. Sample given.  This replaces your other nasal sprays.  If this works well for you, then have [facility] ship the medication to your home - prescription already sent in.   Nasal saline spray (i.e., Simply Saline) or nasal saline lavage (i.e., NeilMed) is recommended as needed and prior to medicated nasal sprays.  May use refresh eye drops.   Need asthma to be more stable before starting AIT.

## 2022-08-23 NOTE — Assessment & Plan Note (Signed)
   See handout for lifestyle and dietary modifications.  Start famotidine '20mg'$  twice a day.

## 2022-08-23 NOTE — Assessment & Plan Note (Signed)
Past history - Diagnosed with asthma over 19 years ago. Patient moved in with her parents who have cats and dogs at home during this time. Did not tolerate Trelegy, Breztri no as effective. Flares in the spring and fall.  Interim history - symptoms flaring requiring daily albuterol use and missed appointment due to ankle surgery. No longer trying to get pregnant.   Today's spirometry was normal - just used albuterol. . Start Fasenra or Nucala injections in the office - our biologics coordinator will be in touch with you. Marland Kitchen Declined oral prednisone.  . Daily controller medication(s):  Marland Kitchen Continue Breo 280mg 1 puff once a day and rinse mouth after each use.  . Continue Spiriva 1.218m 2 puff once a day.  . Continue Singulair '10mg'$  daily . During upper respiratory infections/flares:  o Start budesonide (Pulmicort) 0.'5mg'$  nebulizer for 1-2 weeks until your breathing symptoms return to baseline.  o Pretreat with albuterol 2 puffs or albuterol nebulizer.  o If you need to use your albuterol nebulizer machine back to back within 15-30 minutes with no relief then please go to the ER/urgent care for further evaluation.  . May use albuterol rescue inhaler 2 puffs or nebulizer every 4 to 6 hours as needed for shortness of breath, chest tightness, coughing, and wheezing. May use albuterol rescue inhaler 2 puffs 5 to 15 minutes prior to strenuous physical activities. Monitor frequency of use.   Get spirometry at next visit.

## 2022-08-23 NOTE — Assessment & Plan Note (Signed)
Past history - Eucrisa not effective.  Continue proper skin care measures.  May use triamcinolone 0.1% ointment twice a day as needed for the hands. Do not use on the face, neck, armpits or groin area. Do not use more than 3 weeks in a row.  

## 2022-08-23 NOTE — Assessment & Plan Note (Signed)
Past history - reactions to amoxicillin, cefuroxime, hydrocodone, levofloxacin, morphine, oxycodone, and propoxyphene in the past.   Continue to avoid all these medications.

## 2022-08-23 NOTE — Assessment & Plan Note (Signed)
.   Start doxycycline '100mg'$  twice a day for 10 days.

## 2022-08-25 ENCOUNTER — Encounter (HOSPITAL_BASED_OUTPATIENT_CLINIC_OR_DEPARTMENT_OTHER): Payer: Self-pay | Admitting: Physical Therapy

## 2022-08-25 ENCOUNTER — Ambulatory Visit (HOSPITAL_BASED_OUTPATIENT_CLINIC_OR_DEPARTMENT_OTHER): Payer: BC Managed Care – PPO | Attending: Podiatry | Admitting: Physical Therapy

## 2022-08-25 DIAGNOSIS — R600 Localized edema: Secondary | ICD-10-CM | POA: Insufficient documentation

## 2022-08-25 DIAGNOSIS — M25572 Pain in left ankle and joints of left foot: Secondary | ICD-10-CM | POA: Diagnosis not present

## 2022-08-25 DIAGNOSIS — R262 Difficulty in walking, not elsewhere classified: Secondary | ICD-10-CM | POA: Insufficient documentation

## 2022-08-25 DIAGNOSIS — M25672 Stiffness of left ankle, not elsewhere classified: Secondary | ICD-10-CM | POA: Insufficient documentation

## 2022-08-25 NOTE — Therapy (Signed)
OUTPATIENT PHYSICAL THERAPY LOWER EXTREMITY TREATMENT   Patient Name: Erin Collins MRN: [id number] DOB:1976-01-16, 46 y.o., female Today's Date: 08/25/2022   PT End of Session - 08/25/22 1647     Visit Number 12    Number of Visits 25    Date for PT Re-Evaluation 09/09/22    Authorization Type BCBS    PT Start Time 1620    PT Stop Time 1702    PT Time Calculation (min) 42 min    Activity Tolerance Patient tolerated treatment well    Behavior During Therapy WFL for tasks assessed/performed               Past Medical History:  Diagnosis Date   Anemia    Angio-edema    Asthma    Basal cell carcinoma ~ 1999   collarbone   Blood clot in vein ~ 2008   "left breast"    Complication of anesthesia    "I have trouble waking up afterwards" (12/18/2017)   Diarrhea 04/01/2019   Eczema    Family history of adverse reaction to anesthesia    "mom has trouble waking up afterwards" (12/18/2017)   Fluttering heart    "comes and goes" (12/18/2017)   GERD (gastroesophageal reflux disease)    Headache    "weekly" (12/18/2017)   History of kidney stones    Recurrent upper respiratory infection (URI)    Seizures ([place]) 1980 X 1   "bland stare"   Torticollis    Vertigo    Past Surgical History:  Procedure Laterality Date   ANKLE SURGERY Left 2023   [facility] [place] Right ~ 1999   "collarbone"   DILATION AND CURETTAGE OF UTERUS  2005   LAPAROSCOPIC ENDOMETRIOSIS FULGURATION  2005   NASAL SEPTOPLASTY W/ TURBINOPLASTY Bilateral 2014   [place]   TUBAL LIGATION Right 2005   TYMPANOSTOMY TUBE PLACEMENT     Patient Active Problem List   Diagnosis Date Noted   Gastroesophageal reflux disease 08/23/2022   Acute sinusitis 08/23/2022   Subacute cough 07/25/2022   Depression 07/25/2022   Urine discoloration 07/20/2022   HSV infection 04/28/2022   Headache    Snoring    Insomnia 08/19/2021   Not well  controlled moderate persistent asthma 04/16/2021   Sinusitis 10/08/2020   Osteoporosis 02/04/2020   Seasonal and perennial allergic rhinoconjunctivitis 12/31/2018   Other atopic dermatitis 11/28/2018   Multiple drug allergies 11/28/2018   Healthcare maintenance 05/06/2018    PCP: Starlyn Skeans, MD  REFERRING PROVIDER: Criselda Peaches, DPM  REFERRING DIAG:  [phone] (ICD-10-CM) - Gastrocnemius equinus of left lower extremity  M92.62 (ICD-10-CM) - Haglund's deformity of left heel  M76.62 (ICD-10-CM) - Tendonitis, Achilles, left  Z98.890 (ICD-10-CM) - Status post surgery  Post op Achilles reconstruction and repair 04/29/22. Can be toe touch WB in boot until 06/27/22. Then can progress to full WB in boot with wedges. OK to come out of boot for exercises gradually   THERAPY DIAG:  Pain in left ankle and joints of left foot  Stiffness of left ankle, not elsewhere classified  Difficulty in walking, not elsewhere classified  Localized edema  Rationale for Evaluation and Treatment Rehabilitation  ONSET DATE: DOS 04/29/22  SUBJECTIVE:   SUBJECTIVE STATEMENT: Pt reports she has been working on standing more, with weight shifts. She walked a short distance around office without crutches yesterday, "20 ft total".  She plans to  come back to [place] to go to gym this afternoon.       PERTINENT HISTORY: Chronic h/o foot issues  PAIN:  Are you having pain? Yes: NPRS scale: 2-3/10 Pain location: Lt ankle Pain description: achy, tight Aggravating factors: prolonged standing walking Relieving factors: ice  PRECAUTIONS: Other: ankle WB  WEIGHT BEARING RESTRICTIONS  Can be toe touch WB in boot until 06/27/22. Then can progress to full WB in boot with wedges. OK to come out of boot for exercises gradually 07/22/22:  can be out of boot completely with gait.    FALLS:  Has patient fallen in last 6 months? Yes. 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: Independent  PATIENT GOALS  hiking (was walking 3-5 mi/day), bike rides (did 100 mile bike rides)   OBJECTIVE:    PATIENT SURVEYS:  FOTO 20      SENSATION: Some numbness on post heel  EDEMA:  Circumferential: Left 29 cm around maleoli; Right 26 cm    PALPATION: Minimal mobility through foot and ankle due to edema  LOWER EXTREMITY ROM:  Active/passive ROM Left eval LEFT 7/12 LEFT 7/18 LEFT 7/25 LEFT Left 8/4 Left 8/11 Left  8/29  Ankle dorsiflexion -32/-25 -24 -16 -17/-10 -6 -15 -15/-8 -4/0  Ankle plantarflexion        41  Ankle inversion        24  Ankle eversion        10   (Blank rows = not tested)  LOWER EXTREMITY MMT: not tested at eval   TODAY'S TREATMENT: 08/25/22: Manual edema mobilization with leg elevated On slant board- AP talocrural mobs with movement- did 2 rounds of this, first completed and then went to mat for edema mob and then returned to mobs on board Gait on crutches- encouraging neutral posture and hip extension   PATIENT EDUCATION:  Education details: Geophysicist/field seismologist of condition, verbally reviewed HEP, exercise form/rationale  Person educated: Patient Education method: Explanation, Demonstration, Tactile cues, Verbal cues, and Handouts Education comprehension: verbalized understanding, returned demonstration, verbal cues required, tactile cues required, and needs further education   HOME EXERCISE PROGRAM: [id number] prefers digital  ASSESSMENT:  CLINICAL IMPRESSION: Able to actively DF to 1 deg at the end of the session today. Edema is well mobilized with the exception of pockets anterior and post to lateral malleolus. She will continue to wear compression sock. Asked her to continue to ambulate with axillary crutches as she maintains and upright posture that challenges DF at end stage of weight bearing phase even though she is not putting weight through them. Soon going to [place], [place] and helping family to move to [place].    OBJECTIVE IMPAIRMENTS Abnormal gait,  decreased activity tolerance, decreased mobility, difficulty walking, decreased ROM, decreased strength, increased edema, increased muscle spasms, impaired sensation, improper body mechanics, postural dysfunction, and pain.   ACTIVITY LIMITATIONS carrying, standing, squatting, stairs, transfers, bathing, dressing, locomotion level, and caring for others  PARTICIPATION LIMITATIONS: meal prep, cleaning, laundry, driving, community activity, and occupation  PERSONAL FACTORS  asthma, osteoporosis  are also affecting patient's functional outcome.   REHAB POTENTIAL: Good  CLINICAL DECISION MAKING: Stable/uncomplicated  EVALUATION COMPLEXITY: Low   GOALS: Goals reviewed with patient? Yes  SHORT TERM GOALS: Target date: 07/05/2022  Ankle DF to -10 or better Baseline: Goal status: achieved  2.  Demo proper gait pattern in alignment with WB precautions Baseline:  Goal status: achieved  3.  Resting pain <=7/10 Baseline:  Goal status: achieved  LONG TERM GOALS: Target date: 09/06/2022   Pt will demo ability to perform at least 5 single leg heel raises with UE support for balance Baseline:  Goal status: INITIAL  2.  Pt will return to biking Baseline:  Goal status: INITIAL  3.  Pt will return to hiking on trails with moderate difficulty Baseline:  Goal status: INITIAL  4.  Pt to meet stated FOTO goal Baseline:  Goal status: INITIAL  5.  Gross pain with ADLs <=3/10 Baseline:  Goal status: INITIAL  6.  Demo normalized gait pattern Baseline:  Goal status: INITIAL   PLAN: PT FREQUENCY: 1-2x/week  PT DURATION: 12 weeks  PLANNED INTERVENTIONS: Therapeutic exercises, Therapeutic activity, Neuromuscular re-education, Balance training, Gait training, Patient/Family education, Joint mobilization, Stair training, Aquatic Therapy, Dry Needling, Spinal mobilization, Cryotherapy, Moist heat, Taping, Vasopneumatic device, Manual therapy, and Re-evaluation  PLAN FOR NEXT SESSION:   focus on normalizing gait and improving ROM, re-cert coming soon  Briley Bumgarner C. Rashard Ryle PT, DPT 08/25/22 5:44 PM

## 2022-08-30 ENCOUNTER — Encounter (HOSPITAL_BASED_OUTPATIENT_CLINIC_OR_DEPARTMENT_OTHER): Payer: BC Managed Care – PPO | Admitting: Physical Therapy

## 2022-08-31 ENCOUNTER — Telehealth: Payer: Self-pay | Admitting: Neurology

## 2022-08-31 ENCOUNTER — Telehealth: Payer: Self-pay | Admitting: *Deleted

## 2022-08-31 NOTE — Telephone Encounter (Signed)
L/m for patient to contact me regarding approval, copay card and submit for Fasenra autoinjector to [facility]

## 2022-08-31 NOTE — Telephone Encounter (Signed)
BCBS denied the [employer].  HST BCBS Josem Kaufmann: [id number] (exp. 08/23/22 to 10/21/22).  Left voicemail for patient to call back to schedule.

## 2022-08-31 NOTE — Telephone Encounter (Signed)
-----   Message from Garnet Sierras, DO sent at 08/23/2022 11:55 AM EDT ----- Erin Collins - she is no longer planning on getting pregnant. Would like to start either Fasenra or Nucala injections in the office. Thank you.

## 2022-09-01 ENCOUNTER — Encounter: Payer: BC Managed Care – PPO | Admitting: Podiatry

## 2022-09-05 ENCOUNTER — Encounter (HOSPITAL_BASED_OUTPATIENT_CLINIC_OR_DEPARTMENT_OTHER): Payer: Self-pay | Admitting: Physical Therapy

## 2022-09-05 ENCOUNTER — Ambulatory Visit (HOSPITAL_BASED_OUTPATIENT_CLINIC_OR_DEPARTMENT_OTHER): Payer: BC Managed Care – PPO | Admitting: Physical Therapy

## 2022-09-05 DIAGNOSIS — M25672 Stiffness of left ankle, not elsewhere classified: Secondary | ICD-10-CM

## 2022-09-05 DIAGNOSIS — R262 Difficulty in walking, not elsewhere classified: Secondary | ICD-10-CM | POA: Diagnosis not present

## 2022-09-05 DIAGNOSIS — M25572 Pain in left ankle and joints of left foot: Secondary | ICD-10-CM

## 2022-09-05 DIAGNOSIS — R600 Localized edema: Secondary | ICD-10-CM

## 2022-09-05 NOTE — Therapy (Signed)
OUTPATIENT PHYSICAL THERAPY LOWER EXTREMITY TREATMENT   Patient Name: Erin Collins MRN: [id number] DOB:[birth year], 46 y.o., female Today's Date: 09/05/2022   PT End of Session - 09/05/22 1640     Visit Number 13    Number of Visits 25    Date for PT Re-Evaluation 11/18/22    Authorization Type BCBS    PT Start Time 1640    PT Stop Time 1735    PT Time Calculation (min) 55 min    Activity Tolerance Patient tolerated treatment well    Behavior During Therapy WFL for tasks assessed/performed                Past Medical History:  Diagnosis Date   Anemia    Angio-edema    Asthma    Basal cell carcinoma ~ 1999   collarbone   Blood clot in vein ~ 2008   "left breast"    Complication of anesthesia    "I have trouble waking up afterwards" (12/18/2017)   Diarrhea 04/01/2019   Eczema    Family history of adverse reaction to anesthesia    "mom has trouble waking up afterwards" (12/18/2017)   Fluttering heart    "comes and goes" (12/18/2017)   GERD (gastroesophageal reflux disease)    Headache    "weekly" (12/18/2017)   History of kidney stones    Recurrent upper respiratory infection (URI)    Seizures ([place]) 1980 X 1   "bland stare"   Torticollis    Vertigo    Past Surgical History:  Procedure Laterality Date   ANKLE SURGERY Left 2023   [facility] [place] Right ~ 1999   "collarbone"   DILATION AND CURETTAGE OF UTERUS  2005   LAPAROSCOPIC ENDOMETRIOSIS FULGURATION  2005   NASAL SEPTOPLASTY W/ TURBINOPLASTY Bilateral 2014   [place]   TUBAL LIGATION Right 2005   TYMPANOSTOMY TUBE PLACEMENT     Patient Active Problem List   Diagnosis Date Noted   Gastroesophageal reflux disease 08/23/2022   Acute sinusitis 08/23/2022   Subacute cough 07/25/2022   Depression 07/25/2022   Urine discoloration 07/20/2022   HSV infection 04/28/2022   Headache    Snoring    Insomnia 08/19/2021   Not well  controlled moderate persistent asthma 04/16/2021   Sinusitis 10/08/2020   Osteoporosis 02/04/2020   Seasonal and perennial allergic rhinoconjunctivitis 12/31/2018   Other atopic dermatitis 11/28/2018   Multiple drug allergies 11/28/2018   Healthcare maintenance 05/06/2018    PCP: Starlyn Skeans, MD  REFERRING PROVIDER: Criselda Peaches, DPM  REFERRING DIAG:  [phone] (ICD-10-CM) - Gastrocnemius equinus of left lower extremity  M92.62 (ICD-10-CM) - Haglund's deformity of left heel  M76.62 (ICD-10-CM) - Tendonitis, Achilles, left  Z98.890 (ICD-10-CM) - Status post surgery  Post op Achilles reconstruction and repair 04/29/22. Can be toe touch WB in boot until 06/27/22. Then can progress to full WB in boot with wedges. OK to come out of boot for exercises gradually   THERAPY DIAG:  Pain in left ankle and joints of left foot  Stiffness of left ankle, not elsewhere classified  Difficulty in walking, not elsewhere classified  Localized edema  Rationale for Evaluation and Treatment Rehabilitation  ONSET DATE: DOS 04/29/22  SUBJECTIVE:   SUBJECTIVE STATEMENT: Some pinching on anterior-lateral ankle.    PERTINENT HISTORY: Chronic h/o foot issues  PAIN:  Are you having pain? Yes: NPRS scale: 2-3/10 Pain location: Lt  ankle Pain description: achy, tight Aggravating factors: prolonged standing walking Relieving factors: ice  PRECAUTIONS: Other: ankle WB  WEIGHT BEARING RESTRICTIONS  Can be toe touch WB in boot until 06/27/22. Then can progress to full WB in boot with wedges. OK to come out of boot for exercises gradually 07/22/22:  can be out of boot completely with gait.    FALLS:  Has patient fallen in last 6 months? Yes. 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: Independent  PATIENT GOALS hiking (was walking 3-5 mi/day), bike rides (did 100 mile bike rides)   OBJECTIVE:    PATIENT SURVEYS:  FOTO 20 9/18: 40      SENSATION: WFL  EDEMA:  Circumferential:  Left 29 cm around [facility]; Right 26 cm 9/18  Lt: 28 cm    PALPATION: Minimal mobility through foot and ankle due to edema  LOWER EXTREMITY ROM:  Active/passive ROM Left eval LEFT 7/12 LEFT 7/18 LEFT 7/25 LEFT Left 8/4 Left 8/11 Left  8/29 Left 9/18  Ankle dorsiflexion -32/-25 -24 -16 -17/-10 -6 -15 -15/-8 -4/0 2  Ankle plantarflexion        41   Ankle inversion        24   Ankle eversion        10    (Blank rows = not tested)  LOWER EXTREMITY MMT: not tested at eval   TODAY'S TREATMENT: 09/05/22: IASTM gastroc and incision site Slant board distal tib AP mob with DF Backward stepping on treadmill Gait training- hip ext with midline toe off SLS  Vaso 15 min 34 deg  08/25/22: Manual edema mobilization with leg elevated On slant board- AP talocrural mobs with movement- did 2 rounds of this, first completed and then went to mat for edema mob and then returned to mobs on board Gait on crutches- encouraging neutral posture and hip extension   PATIENT EDUCATION:  Education details: Geophysicist/field seismologist of condition, verbally reviewed HEP, exercise form/rationale  Person educated: Patient Education method: Explanation, Demonstration, Tactile cues, Verbal cues, and Handouts Education comprehension: verbalized understanding, returned demonstration, verbal cues required, tactile cues required, and needs further education   HOME EXERCISE PROGRAM: [id number] prefers digital  ASSESSMENT:  CLINICAL IMPRESSION: Overall progressing toward goals. Will continue to progress ROM with functional movement and pt will continue to wear compression to decrease edema. Extended POC date today to continue progress and meet LTGs.    OBJECTIVE IMPAIRMENTS Abnormal gait, decreased activity tolerance, decreased mobility, difficulty walking, decreased ROM, decreased strength, increased edema, increased muscle spasms, impaired sensation, improper body mechanics, postural dysfunction, and pain.   ACTIVITY  LIMITATIONS carrying, standing, squatting, stairs, transfers, bathing, dressing, locomotion level, and caring for others  PARTICIPATION LIMITATIONS: meal prep, cleaning, laundry, driving, community activity, and occupation  PERSONAL FACTORS  asthma, osteoporosis  are also affecting patient's functional outcome.   REHAB POTENTIAL: Good  CLINICAL DECISION MAKING: Stable/uncomplicated  EVALUATION COMPLEXITY: Low   GOALS: Goals reviewed with patient? Yes  SHORT TERM GOALS: Target date: 07/05/2022  Ankle DF to -10 or better Baseline: Goal status: achieved  2.  Demo proper gait pattern in alignment with WB precautions Baseline:  Goal status: achieved  3.  Resting pain <=7/10 Baseline:  Goal status: achieved   LONG TERM GOALS: Target date: 11/18/22  Pt will demo ability to perform at least 5 single leg heel raises with UE support for balance Baseline:  Goal status: ongoing  2.  Pt will return to biking Baseline:  Goal status:  ongoing  3.  Pt will return to hiking on trails with moderate difficulty Baseline:  Goal status: ongoing  4.  Pt to meet stated FOTO goal Baseline:  Goal status: ongoing  5.  Gross pain with ADLs <=3/10 Baseline:  Goal status:achieved with current level  6.  Demo normalized gait pattern Baseline:  Goal status: ongoing   PLAN: PT FREQUENCY: 1-2x/week  PT DURATION: 12 weeks  PLANNED INTERVENTIONS: Therapeutic exercises, Therapeutic activity, Neuromuscular re-education, Balance training, Gait training, Patient/Family education, Joint mobilization, Stair training, Aquatic Therapy, Dry Needling, Spinal mobilization, Cryotherapy, Moist heat, Taping, Vasopneumatic device, Manual therapy, and Re-evaluation  PLAN FOR NEXT SESSION:  focus on normalizing gait and improving ROM  Leane Loring C. Skyann Ganim PT, DPT 09/05/22 5:49 PM

## 2022-09-12 ENCOUNTER — Encounter (HOSPITAL_BASED_OUTPATIENT_CLINIC_OR_DEPARTMENT_OTHER): Payer: Self-pay | Admitting: Physical Therapy

## 2022-09-12 ENCOUNTER — Ambulatory Visit (HOSPITAL_BASED_OUTPATIENT_CLINIC_OR_DEPARTMENT_OTHER): Payer: BC Managed Care – PPO | Admitting: Physical Therapy

## 2022-09-12 DIAGNOSIS — M25572 Pain in left ankle and joints of left foot: Secondary | ICD-10-CM

## 2022-09-12 DIAGNOSIS — R600 Localized edema: Secondary | ICD-10-CM | POA: Diagnosis not present

## 2022-09-12 DIAGNOSIS — R262 Difficulty in walking, not elsewhere classified: Secondary | ICD-10-CM

## 2022-09-12 DIAGNOSIS — M25672 Stiffness of left ankle, not elsewhere classified: Secondary | ICD-10-CM

## 2022-09-12 NOTE — Therapy (Signed)
OUTPATIENT PHYSICAL THERAPY LOWER EXTREMITY TREATMENT   Patient Name: Erin Collins MRN: [id number] DOB:1976/05/16, 46 y.o., female Today's Date: 09/12/2022   PT End of Session - 09/12/22 1741     Visit Number 14    Number of Visits 25    Date for PT Re-Evaluation 11/18/22    Authorization Type BCBS    PT Start Time 1645    PT Stop Time 1725    PT Time Calculation (min) 40 min    Activity Tolerance Patient tolerated treatment well    Behavior During Therapy WFL for tasks assessed/performed                 Past Medical History:  Diagnosis Date   Anemia    Angio-edema    Asthma    Basal cell carcinoma ~ 1999   collarbone   Blood clot in vein ~ 2008   "left breast"    Complication of anesthesia    "I have trouble waking up afterwards" (12/18/2017)   Diarrhea 04/01/2019   Eczema    Family history of adverse reaction to anesthesia    "mom has trouble waking up afterwards" (12/18/2017)   Fluttering heart    "comes and goes" (12/18/2017)   GERD (gastroesophageal reflux disease)    Headache    "weekly" (12/18/2017)   History of kidney stones    Recurrent upper respiratory infection (URI)    Seizures ([place]) 1980 X 1   "bland stare"   Torticollis    Vertigo    Past Surgical History:  Procedure Laterality Date   ANKLE SURGERY Left 2023   [facility] [place] Right ~ 1999   "collarbone"   DILATION AND CURETTAGE OF UTERUS  2005   LAPAROSCOPIC ENDOMETRIOSIS FULGURATION  2005   NASAL SEPTOPLASTY W/ TURBINOPLASTY Bilateral 2014   [place]   TUBAL LIGATION Right 2005   TYMPANOSTOMY TUBE PLACEMENT     Patient Active Problem List   Diagnosis Date Noted   Gastroesophageal reflux disease 08/23/2022   Acute sinusitis 08/23/2022   Subacute cough 07/25/2022   Depression 07/25/2022   Urine discoloration 07/20/2022   HSV infection 04/28/2022   Headache    Snoring    Insomnia 08/19/2021   Not well  controlled moderate persistent asthma 04/16/2021   Sinusitis 10/08/2020   Osteoporosis 02/04/2020   Seasonal and perennial allergic rhinoconjunctivitis 12/31/2018   Other atopic dermatitis 11/28/2018   Multiple drug allergies 11/28/2018   Healthcare maintenance 05/06/2018    PCP: Starlyn Skeans, MD  REFERRING PROVIDER: Criselda Peaches, DPM  REFERRING DIAG:  [phone] (ICD-10-CM) - Gastrocnemius equinus of left lower extremity  M92.62 (ICD-10-CM) - Haglund's deformity of left heel  M76.62 (ICD-10-CM) - Tendonitis, Achilles, left  Z98.890 (ICD-10-CM) - Status post surgery  Post op Achilles reconstruction and repair 04/29/22. Can be toe touch WB in boot until 06/27/22. Then can progress to full WB in boot with wedges. OK to come out of boot for exercises gradually   THERAPY DIAG:  Pain in left ankle and joints of left foot  Stiffness of left ankle, not elsewhere classified  Difficulty in walking, not elsewhere classified  Localized edema  Rationale for Evaluation and Treatment Rehabilitation  ONSET DATE: DOS 04/29/22  SUBJECTIVE:   SUBJECTIVE STATEMENT: Pt states that the L ankle feels pinching in the front. She has been walking around a lot recently without the crutches. She states her heel feels  sore.    PERTINENT HISTORY: Chronic h/o foot issues  PAIN:  Are you having pain? Yes: NPRS scale: 2-3/10 Pain location: Lt ankle Pain description: achy, tight Aggravating factors: prolonged standing walking Relieving factors: ice  PRECAUTIONS: Other: ankle WB  WEIGHT BEARING RESTRICTIONS  Can be toe touch WB in boot until 06/27/22. Then can progress to full WB in boot with wedges. OK to come out of boot for exercises gradually 07/22/22:  can be out of boot completely with gait.    FALLS:  Has patient fallen in last 6 months? Yes. 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: Independent  PATIENT GOALS hiking (was walking 3-5 mi/day), bike rides (did 100 mile bike  rides)   OBJECTIVE:    PATIENT SURVEYS:  FOTO 20 9/18: 40      SENSATION: WFL  EDEMA:  Circumferential: Left 29 cm around [facility]; Right 26 cm 9/18  Lt: 28 cm    PALPATION: Minimal mobility through foot and ankle due to edema  LOWER EXTREMITY ROM:  Active/passive ROM Left eval LEFT 7/12 LEFT 7/18 LEFT 7/25 LEFT Left 8/4 Left 8/11 Left  8/29 Left 9/18  Ankle dorsiflexion -32/-25 -24 -16 -17/-10 -6 -15 -15/-8 -4/0 2  Ankle plantarflexion        41   Ankle inversion        24   Ankle eversion        10    (Blank rows = not tested)  LOWER EXTREMITY MMT: not tested at eval   TODAY'S TREATMENT: 9/25  TCJ grade IV post glide    Heel drop and raise at step 2x10 3s hold at bottom Shuttle leg press 2x10 50lbs  Mini counter squat to end range at bar 2s pause at stretch 2x10 Sidestepping YTB 4f 2x SLS at bar 30s 3x Gastroc/soleus wall stretch 30s 3x  Self ankle DF mob with band video provided    09/05/22: IASTM gastroc and incision site Slant board distal tib AP mob with DF Backward stepping on treadmill Gait training- hip ext with midline toe off SLS  Vaso 15 min 34 deg  08/25/22: Manual edema mobilization with leg elevated On slant board- AP talocrural mobs with movement- did 2 rounds of this, first completed and then went to mat for edema mob and then returned to mobs on board Gait on crutches- encouraging neutral posture and hip extension   PATIENT EDUCATION:  Education details: AGeophysicist/field seismologistof condition, verbally reviewed HEP, exercise form/rationale  Person educated: Patient Education method: Explanation, Demonstration, Tactile cues, Verbal cues, and Handouts Education comprehension: verbalized understanding, returned demonstration, verbal cues required, tactile cues required, and needs further education   HOME EXERCISE PROGRAM: [email or web address] prefers digital  ASSESSMENT:  CLINICAL IMPRESSION: Pt presented with significantly limited ankle DF  with gait but able to improve by end of session. Pt able to reach 10 deg of CKC DF following aggressive joint mob and exercise. Pt HEP updated today to focus on end range DF in BW loaded positions in order to improve gait as well as L ankle stability. Plan to progress as tolerated and consider review of self ankle DF mob. Pt would benefit from continued skilled therapy in order to reach goals and maximize functional L LE strength and ROM for full return to PLOF.    OBJECTIVE IMPAIRMENTS Abnormal gait, decreased activity tolerance, decreased mobility, difficulty walking, decreased ROM, decreased strength, increased edema, increased muscle spasms, impaired sensation, improper body mechanics, postural dysfunction, and pain.  ACTIVITY LIMITATIONS carrying, standing, squatting, stairs, transfers, bathing, dressing, locomotion level, and caring for others  PARTICIPATION LIMITATIONS: meal prep, cleaning, laundry, driving, community activity, and occupation  PERSONAL FACTORS  asthma, osteoporosis  are also affecting patient's functional outcome.   REHAB POTENTIAL: Good  CLINICAL DECISION MAKING: Stable/uncomplicated  EVALUATION COMPLEXITY: Low   GOALS: Goals reviewed with patient? Yes  SHORT TERM GOALS: Target date: 07/05/2022  Ankle DF to -10 or better Baseline: Goal status: achieved  2.  Demo proper gait pattern in alignment with WB precautions Baseline:  Goal status: achieved  3.  Resting pain <=7/10 Baseline:  Goal status: achieved   LONG TERM GOALS: Target date: 11/18/22  Pt will demo ability to perform at least 5 single leg heel raises with UE support for balance Baseline:  Goal status: ongoing  2.  Pt will return to biking Baseline:  Goal status: ongoing  3.  Pt will return to hiking on trails with moderate difficulty Baseline:  Goal status: ongoing  4.  Pt to meet stated FOTO goal Baseline:  Goal status: ongoing  5.  Gross pain with ADLs <=3/10 Baseline:  Goal  status:achieved with current level  6.  Demo normalized gait pattern Baseline:  Goal status: ongoing   PLAN: PT FREQUENCY: 1-2x/week  PT DURATION: 12 weeks  PLANNED INTERVENTIONS: Therapeutic exercises, Therapeutic activity, Neuromuscular re-education, Balance training, Gait training, Patient/Family education, Joint mobilization, Stair training, Aquatic Therapy, Dry Needling, Spinal mobilization, Cryotherapy, Moist heat, Taping, Vasopneumatic device, Manual therapy, and Re-evaluation  PLAN FOR NEXT SESSION:  focus on normalizing gait and improving ROM  Daleen Bo PT, DPT 09/12/22 5:46 PM

## 2022-09-12 NOTE — Telephone Encounter (Signed)
L/m for patient again to contact me ?

## 2022-09-19 ENCOUNTER — Encounter: Payer: Self-pay | Admitting: *Deleted

## 2022-09-19 NOTE — Telephone Encounter (Signed)
Sent MY chart message to reach out to me

## 2022-09-20 NOTE — Telephone Encounter (Signed)
Patient called and was advised approval and submit to [facility] with instructions on delivery, storage and initial appt in clinic for first dose

## 2022-09-21 ENCOUNTER — Encounter (HOSPITAL_BASED_OUTPATIENT_CLINIC_OR_DEPARTMENT_OTHER): Payer: Self-pay | Admitting: Physical Therapy

## 2022-09-21 ENCOUNTER — Telehealth: Payer: Self-pay | Admitting: Allergy

## 2022-09-21 ENCOUNTER — Ambulatory Visit (HOSPITAL_BASED_OUTPATIENT_CLINIC_OR_DEPARTMENT_OTHER): Payer: BC Managed Care – PPO | Attending: Podiatry | Admitting: Physical Therapy

## 2022-09-21 DIAGNOSIS — R262 Difficulty in walking, not elsewhere classified: Secondary | ICD-10-CM | POA: Insufficient documentation

## 2022-09-21 DIAGNOSIS — R600 Localized edema: Secondary | ICD-10-CM | POA: Diagnosis not present

## 2022-09-21 DIAGNOSIS — M25672 Stiffness of left ankle, not elsewhere classified: Secondary | ICD-10-CM | POA: Insufficient documentation

## 2022-09-21 DIAGNOSIS — M25572 Pain in left ankle and joints of left foot: Secondary | ICD-10-CM | POA: Diagnosis not present

## 2022-09-21 NOTE — Telephone Encounter (Signed)
Patient called and stated that she needs a refill on her Ventolin Inhaler. Pharmacy is [facility] [address] in [place]. Patients call back number is [phone]

## 2022-09-21 NOTE — Therapy (Signed)
OUTPATIENT PHYSICAL THERAPY LOWER EXTREMITY TREATMENT   Patient Name: Erin Collins MRN: [id number] DOB:[birth year], 46 y.o., female Today's Date: 09/21/2022   PT End of Session - 09/21/22 1556     Visit Number 15    Number of Visits 25    Date for PT Re-Evaluation 11/18/22    Authorization Type BCBS    PT Start Time 1520   arrives late   PT Stop Time 1555    PT Time Calculation (min) 35 min    Activity Tolerance Patient tolerated treatment well    Behavior During Therapy WFL for tasks assessed/performed                  Past Medical History:  Diagnosis Date   Anemia    Angio-edema    Asthma    Basal cell carcinoma ~ 1999   collarbone   Blood clot in vein ~ 2008   "left breast"    Complication of anesthesia    "I have trouble waking up afterwards" (12/18/2017)   Diarrhea 04/01/2019   Eczema    Family history of adverse reaction to anesthesia    "mom has trouble waking up afterwards" (12/18/2017)   Fluttering heart    "comes and goes" (12/18/2017)   GERD (gastroesophageal reflux disease)    Headache    "weekly" (12/18/2017)   History of kidney stones    Recurrent upper respiratory infection (URI)    Seizures ([place]) 1980 X 1   "bland stare"   Torticollis    Vertigo    Past Surgical History:  Procedure Laterality Date   ANKLE SURGERY Left 2023   [facility] [place] Right ~ 1999   "collarbone"   DILATION AND CURETTAGE OF UTERUS  2005   LAPAROSCOPIC ENDOMETRIOSIS FULGURATION  2005   NASAL SEPTOPLASTY W/ TURBINOPLASTY Bilateral 2014   [place]   TUBAL LIGATION Right 2005   TYMPANOSTOMY TUBE PLACEMENT     Patient Active Problem List   Diagnosis Date Noted   Gastroesophageal reflux disease 08/23/2022   Acute sinusitis 08/23/2022   Subacute cough 07/25/2022   Depression 07/25/2022   Urine discoloration 07/20/2022   HSV infection 04/28/2022   Headache    Snoring    Insomnia 08/19/2021    Not well controlled moderate persistent asthma 04/16/2021   Sinusitis 10/08/2020   Osteoporosis 02/04/2020   Seasonal and perennial allergic rhinoconjunctivitis 12/31/2018   Other atopic dermatitis 11/28/2018   Multiple drug allergies 11/28/2018   Healthcare maintenance 05/06/2018    PCP: Starlyn Skeans, MD  REFERRING PROVIDER: Criselda Peaches, DPM  REFERRING DIAG:  [phone] (ICD-10-CM) - Gastrocnemius equinus of left lower extremity  M92.62 (ICD-10-CM) - Haglund's deformity of left heel  M76.62 (ICD-10-CM) - Tendonitis, Achilles, left  Z98.890 (ICD-10-CM) - Status post surgery  Post op Achilles reconstruction and repair 04/29/22. Can be toe touch WB in boot until 06/27/22. Then can progress to full WB in boot with wedges. OK to come out of boot for exercises gradually   THERAPY DIAG:  Pain in left ankle and joints of left foot  Stiffness of left ankle, not elsewhere classified  Difficulty in walking, not elsewhere classified  Localized edema  Rationale for Evaluation and Treatment Rehabilitation  ONSET DATE: DOS 04/29/22  SUBJECTIVE:   SUBJECTIVE STATEMENT: Pt states the it is a little tender today but the strength is improving at this time. She walked a lot today and  feels tender into the achilles today. She felt a rubbing in the posterior aspect of L malleolus. No issues with new HEP.    PERTINENT HISTORY: Chronic h/o foot issues  PAIN:  Are you having pain? Yes: NPRS scale: 1-2/10 Pain location: Lt ankle Pain description: achy, tight Aggravating factors: prolonged standing walking Relieving factors: ice  PRECAUTIONS: Other: ankle WB  WEIGHT BEARING RESTRICTIONS  Can be toe touch WB in boot until 06/27/22. Then can progress to full WB in boot with wedges. OK to come out of boot for exercises gradually 07/22/22:  can be out of boot completely with gait.    FALLS:  Has patient fallen in last 6 months? Yes. 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:  Independent  PATIENT GOALS hiking (was walking 3-5 mi/day), bike rides (did 100 mile bike rides)   OBJECTIVE:    PATIENT SURVEYS:  FOTO 20 9/18: 40      SENSATION: WFL  EDEMA:  Circumferential: Left 29 cm around [facility]; Right 26 cm 9/18  Lt: 28 cm    PALPATION: Minimal mobility through foot and ankle due to edema  LOWER EXTREMITY ROM:  Active/passive ROM Left eval LEFT 7/12 LEFT 7/18 LEFT 7/25 LEFT Left 8/4 Left 8/11 Left  8/29 Left 9/18  Ankle dorsiflexion -32/-25 -24 -16 -17/-10 -6 -15 -15/-8 -4/0 2  Ankle plantarflexion        41   Ankle inversion        24   Ankle eversion        10    (Blank rows = not tested)  LOWER EXTREMITY MMT: not tested at eval   TODAY'S TREATMENT: 10/4  TCJ grade IV post glide  Self STM gastroc and fibularis  Heel drop and raise at step 2x10 3s hold at bottom; staggered with L behind SL Shuttle leg press 2x10 100lbs 2s at bottom, max tolerable DF; or use white leg press with 50 lbs Standing fibularis stretch 30s 3x Standing lunge 15x each foot with table support    9/25  TCJ grade IV post glide    Heel drop and raise at step 2x10 3s hold at bottom Shuttle leg press 2x10 50lbs  Mini counter squat to end range at bar 2s pause at stretch 2x10 Sidestepping YTB 43f 2x SLS at bar 30s 3x Gastroc/soleus wall stretch 30s 3x  Self ankle DF mob with band video provided    09/05/22: IASTM gastroc and incision site Slant board distal tib AP mob with DF Backward stepping on treadmill Gait training- hip ext with midline toe off SLS  Vaso 15 min 34 deg  08/25/22: Manual edema mobilization with leg elevated On slant board- AP talocrural mobs with movement- did 2 rounds of this, first completed and then went to mat for edema mob and then returned to mobs on board Gait on crutches- encouraging neutral posture and hip extension   PATIENT EDUCATION:  Education details: AGeophysicist/field seismologistof condition, verbally reviewed HEP,  exercise form/rationale  Person educated: Patient Education method: Explanation, Demonstration, Tactile cues, Verbal cues, and Handouts Education comprehension: verbalized understanding, returned demonstration, verbal cues required, tactile cues required, and needs further education   HOME EXERCISE PROGRAM: [email or web address] prefers digital  ASSESSMENT:  CLINICAL IMPRESSION: Pt presents today with ankle stiffness again into DF that causes fairly signficant limp. No pain associated with gait but stiffness noted. Pt able to improve gait quality following aggressive joint mob and CKC DF loading. Pt advised to prioritize ankle ROM  through exercise, self joint mob, and self soft tissue work. 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 Pt HEP updated today without issue. Plan to continue with AROM DF focus and PF strength as tolerated. Pt would benefit from continued skilled therapy in order to reach goals and maximize functional L LE strength and ROM for full return to PLOF.    OBJECTIVE IMPAIRMENTS Abnormal gait, decreased activity tolerance, decreased mobility, difficulty walking, decreased ROM, decreased strength, increased edema, increased muscle spasms, impaired sensation, improper body mechanics, postural dysfunction, and pain.   ACTIVITY LIMITATIONS carrying, standing, squatting, stairs, transfers, bathing, dressing, locomotion level, and caring for others  PARTICIPATION LIMITATIONS: meal prep, cleaning, laundry, driving, community activity, and occupation  PERSONAL FACTORS  asthma, osteoporosis  are also affecting patient's functional outcome.   REHAB POTENTIAL: Good  CLINICAL DECISION MAKING: Stable/uncomplicated  EVALUATION COMPLEXITY: Low   GOALS: Goals reviewed with patient? Yes  SHORT TERM GOALS: Target date: 07/05/2022  Ankle DF to -10 or better Baseline: Goal status: achieved  2.  Demo proper gait pattern in alignment  with WB precautions Baseline:  Goal status: achieved  3.  Resting pain <=7/10 Baseline:  Goal status: achieved   LONG TERM GOALS: Target date: 11/18/22  Pt will demo ability to perform at least 5 single leg heel raises with UE support for balance Baseline:  Goal status: ongoing  2.  Pt will return to biking Baseline:  Goal status: ongoing  3.  Pt will return to hiking on trails with moderate difficulty Baseline:  Goal status: ongoing  4.  Pt to meet stated FOTO goal Baseline:  Goal status: ongoing  5.  Gross pain with ADLs <=3/10 Baseline:  Goal status:achieved with current level  6.  Demo normalized gait pattern Baseline:  Goal status: ongoing   PLAN: PT FREQUENCY: 1-2x/week  PT DURATION: 12 weeks  PLANNED INTERVENTIONS: Therapeutic exercises, Therapeutic activity, Neuromuscular re-education, Balance training, Gait training, Patient/Family education, Joint mobilization, Stair training, Aquatic Therapy, Dry Needling, Spinal mobilization, Cryotherapy, Moist heat, Taping, Vasopneumatic device, Manual therapy, and Re-evaluation  PLAN FOR NEXT SESSION:  focus on normalizing gait and improving ROM  Daleen Bo PT, DPT 09/21/22 4:00 PM

## 2022-09-22 ENCOUNTER — Ambulatory Visit: Payer: BC Managed Care – PPO | Admitting: Podiatry

## 2022-09-22 DIAGNOSIS — M9262 Juvenile osteochondrosis of tarsus, left ankle: Secondary | ICD-10-CM | POA: Diagnosis not present

## 2022-09-22 DIAGNOSIS — M62462 Contracture of muscle, left lower leg: Secondary | ICD-10-CM

## 2022-09-22 DIAGNOSIS — M7662 Achilles tendinitis, left leg: Secondary | ICD-10-CM | POA: Diagnosis not present

## 2022-09-22 MED ORDER — VENTOLIN HFA 108 (90 BASE) MCG/ACT IN AERS: INHALATION_SPRAY | RESPIRATORY_TRACT | 1 refills | Status: DC

## 2022-09-26 NOTE — Progress Notes (Signed)
  Subjective:  Patient ID: Erin Collins, female    DOB: 12-03-1976,  MRN: [id number]  Chief Complaint  Patient presents with   Routine Post Op     POV #4 DOS 04/29/2022 REPAIR OF ACHILLES TENDON, CALF MUSLE LENGTHENING, REMOVAL OF EXTRA BONE/SPUR, POSSIBLE TENDON TRANSFER - doing great, scheduled for PT through November      46 y.o. female returns for post-op check.  She is doing well physical therapy is very helpful  Review of Systems: Negative except as noted in the HPI. Denies N/V/F/Ch.   Objective:  There were no vitals filed for this visit. There is no height or weight on file to calculate BMI. Constitutional Well developed. Well nourished.  Vascular Foot warm and well perfused. Capillary refill normal to all digits.  Calf is soft and supple, no posterior calf or knee pain, no evidence of DVT  Neurologic Normal speech. Oriented to person, place, and time. Epicritic sensation to light touch grossly present bilaterally.  Dermatologic Incision is fully healed not hypertrophic  Orthopedic: Good 5 out of 5 strength in plantarflexion, minimal edema, minimal tenderness in the distal incision     Multiple view plain film radiographs: No appreciable change since last films taken on 05/05/2022 Assessment:   1. Tendonitis, Achilles, left   2. Haglund's deformity of left heel   3. Gastrocnemius equinus of left lower extremity       Plan:  Patient was evaluated and treated and all questions answered.  S/p foot surgery left -Doing very well she may continue regular shoe gear and activity she may begin hiking and discussed hiking shoes and wearing hightop boots to protect the Achilles.  She will continue PT until she has maximized her improvement.  She will return to see me as needed  No follow-ups on file.

## 2022-09-29 ENCOUNTER — Encounter (HOSPITAL_BASED_OUTPATIENT_CLINIC_OR_DEPARTMENT_OTHER): Payer: Self-pay | Admitting: Physical Therapy

## 2022-09-29 ENCOUNTER — Ambulatory Visit (HOSPITAL_BASED_OUTPATIENT_CLINIC_OR_DEPARTMENT_OTHER): Payer: BC Managed Care – PPO | Admitting: Physical Therapy

## 2022-09-29 DIAGNOSIS — R262 Difficulty in walking, not elsewhere classified: Secondary | ICD-10-CM | POA: Diagnosis not present

## 2022-09-29 DIAGNOSIS — R600 Localized edema: Secondary | ICD-10-CM

## 2022-09-29 DIAGNOSIS — M25572 Pain in left ankle and joints of left foot: Secondary | ICD-10-CM | POA: Diagnosis not present

## 2022-09-29 DIAGNOSIS — M25672 Stiffness of left ankle, not elsewhere classified: Secondary | ICD-10-CM | POA: Diagnosis not present

## 2022-09-29 NOTE — Therapy (Signed)
OUTPATIENT PHYSICAL THERAPY LOWER EXTREMITY TREATMENT   Patient Name: Erin Collins MRN: [id number] DOB:[birth year], 46 y.o., female Today's Date: 09/29/2022   PT End of Session - 09/29/22 1643     Visit Number 16    Number of Visits 25    Date for PT Re-Evaluation 11/18/22    Authorization Type BCBS    PT Start Time 1643    PT Stop Time 1723    PT Time Calculation (min) 40 min    Activity Tolerance Patient tolerated treatment well    Behavior During Therapy WFL for tasks assessed/performed                  Past Medical History:  Diagnosis Date   Anemia    Angio-edema    Asthma    Basal cell carcinoma ~ 1999   collarbone   Blood clot in vein ~ 2008   "left breast"    Complication of anesthesia    "I have trouble waking up afterwards" (12/18/2017)   Diarrhea 04/01/2019   Eczema    Family history of adverse reaction to anesthesia    "mom has trouble waking up afterwards" (12/18/2017)   Fluttering heart    "comes and goes" (12/18/2017)   GERD (gastroesophageal reflux disease)    Headache    "weekly" (12/18/2017)   History of kidney stones    Recurrent upper respiratory infection (URI)    Seizures ([place]) 1980 X 1   "bland stare"   Torticollis    Vertigo    Past Surgical History:  Procedure Laterality Date   ANKLE SURGERY Left 2023   [facility] Right ~ 1999   "collarbone"   DILATION AND CURETTAGE OF UTERUS  2005   LAPAROSCOPIC ENDOMETRIOSIS FULGURATION  2005   NASAL SEPTOPLASTY W/ TURBINOPLASTY Bilateral 2014   [place]   TUBAL LIGATION Right 2005   TYMPANOSTOMY TUBE PLACEMENT     Patient Active Problem List   Diagnosis Date Noted   Gastroesophageal reflux disease 08/23/2022   Acute sinusitis 08/23/2022   Subacute cough 07/25/2022   Depression 07/25/2022   Urine discoloration 07/20/2022   HSV infection 04/28/2022   Headache    Snoring    Insomnia 08/19/2021   Not well  controlled moderate persistent asthma 04/16/2021   Sinusitis 10/08/2020   Osteoporosis 02/04/2020   Seasonal and perennial allergic rhinoconjunctivitis 12/31/2018   Other atopic dermatitis 11/28/2018   Multiple drug allergies 11/28/2018   Healthcare maintenance 05/06/2018    PCP: Starlyn Skeans, MD  REFERRING PROVIDER: Criselda Peaches, DPM  REFERRING DIAG:  [phone] (ICD-10-CM) - Gastrocnemius equinus of left lower extremity  M92.62 (ICD-10-CM) - Haglund's deformity of left heel  M76.62 (ICD-10-CM) - Tendonitis, Achilles, left  Z98.890 (ICD-10-CM) - Status post surgery  Post op Achilles reconstruction and repair 04/29/22. Can be toe touch WB in boot until 06/27/22. Then can progress to full WB in boot with wedges. OK to come out of boot for exercises gradually   THERAPY DIAG:  Pain in left ankle and joints of left foot  Stiffness of left ankle, not elsewhere classified  Difficulty in walking, not elsewhere classified  Localized edema  Rationale for Evaluation and Treatment Rehabilitation  ONSET DATE: DOS 04/29/22  SUBJECTIVE:   SUBJECTIVE STATEMENT: Pt states the it is a little tender today but the strength is improving at this time. She walked a lot today and feels tender into  the achilles today. She felt a rubbing in the posterior aspect of L malleolus. No issues with new HEP.    PERTINENT HISTORY: Chronic h/o foot issues  PAIN:  Are you having pain? Yes: NPRS scale: 1-2/10 Pain location: Lt ankle Pain description: achy, tight Aggravating factors: prolonged standing walking Relieving factors: ice  PRECAUTIONS: Other: ankle WB  WEIGHT BEARING RESTRICTIONS  Can be toe touch WB in boot until 06/27/22. Then can progress to full WB in boot with wedges. OK to come out of boot for exercises gradually 07/22/22:  can be out of boot completely with gait.    FALLS:  Has patient fallen in last 6 months? Yes. 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:  Independent  PATIENT GOALS hiking (was walking 3-5 mi/day), bike rides (did 100 mile bike rides)   OBJECTIVE:    PATIENT SURVEYS:  FOTO 20 9/18: 40      SENSATION: WFL  EDEMA:  Circumferential: Left 29 cm around [facility]; Right 26 cm 9/18  Lt: 28 cm    PALPATION: Minimal mobility through foot and ankle due to edema  LOWER EXTREMITY ROM:  Active/passive ROM Left eval LEFT 7/12 LEFT 7/18 LEFT 7/25 LEFT Left 8/4 Left 8/11 Left  8/29 Left 9/18 Left 10/12  Ankle dorsiflexion -32/-25 -24 -16 -17/-10 -6 -15 -15/-8 -4/0 2 2 / 5  Ankle plantarflexion        41    Ankle inversion        24    Ankle eversion        10     (Blank rows = not tested)  LOWER EXTREMITY MMT: not tested at eval   TODAY'S TREATMENT:  Treatment                            09/29/22:  Squat DF mob on slant board SLS on airex Mcconnell tape providing support to peroneal tendon distally post to lat maleolus Lateral stepping on/off airex Toe raises Retro stepping   10/4  TCJ grade IV post glide  Self STM gastroc and fibularis  Heel drop and raise at step 2x10 3s hold at bottom; staggered with L behind SL Shuttle leg press 2x10 100lbs 2s at bottom, max tolerable DF; or use white leg press with 50 lbs Standing fibularis stretch 30s 3x Standing lunge 15x each foot with table support    9/25  TCJ grade IV post glide    Heel drop and raise at step 2x10 3s hold at bottom Shuttle leg press 2x10 50lbs  Mini counter squat to end range at bar 2s pause at stretch 2x10 Sidestepping YTB 85f 2x SLS at bar 30s 3x Gastroc/soleus wall stretch 30s 3x  Self ankle DF mob with band video provided    09/05/22: IASTM gastroc and incision site Slant board distal tib AP mob with DF Backward stepping on treadmill Gait training- hip ext with midline toe off SLS  Vaso 15 min 34 deg  08/25/22: Manual edema mobilization with leg elevated On slant board- AP talocrural mobs with movement- did 2  rounds of this, first completed and then went to mat for edema mob and then returned to mobs on board Gait on crutches- encouraging neutral posture and hip extension   PATIENT EDUCATION:  Education details: Anatomy of condition, verbally reviewed HEP, exercise form/rationale  Person educated: Patient Education method: Explanation, Demonstration, Tactile cues, Verbal cues, and Handouts Education comprehension: verbalized understanding,  returned demonstration, verbal cues required, tactile cues required, and needs further education   HOME EXERCISE PROGRAM: [email or web address]- prefers digital  ASSESSMENT:  CLINICAL IMPRESSION: Dislocation of peroneal tendons notable in exercises and reduced with taping and awareness of knee flexion/extension control     OBJECTIVE IMPAIRMENTS Abnormal gait, decreased activity tolerance, decreased mobility, difficulty walking, decreased ROM, decreased strength, increased edema, increased muscle spasms, impaired sensation, improper body mechanics, postural dysfunction, and pain.   ACTIVITY LIMITATIONS carrying, standing, squatting, stairs, transfers, bathing, dressing, locomotion level, and caring for others  PARTICIPATION LIMITATIONS: meal prep, cleaning, laundry, driving, community activity, and occupation  PERSONAL FACTORS  asthma, osteoporosis  are also affecting patient's functional outcome.   REHAB POTENTIAL: Good  CLINICAL DECISION MAKING: Stable/uncomplicated  EVALUATION COMPLEXITY: Low   GOALS: Goals reviewed with patient? Yes  SHORT TERM GOALS: Target date: 07/05/2022  Ankle DF to -10 or better Baseline: Goal status: achieved  2.  Demo proper gait pattern in alignment with WB precautions Baseline:  Goal status: achieved  3.  Resting pain <=7/10 Baseline:  Goal status: achieved   LONG TERM GOALS: Target date: 11/18/22  Pt will demo ability to perform at least 5 single leg heel raises with UE support for balance Baseline:  Goal status:  ongoing  2.  Pt will return to biking Baseline:  Goal status: ongoing  3.  Pt will return to hiking on trails with moderate difficulty Baseline:  Goal status: ongoing  4.  Pt to meet stated FOTO goal Baseline:  Goal status: ongoing  5.  Gross pain with ADLs <=3/10 Baseline:  Goal status:achieved with current level  6.  Demo normalized gait pattern Baseline:  Goal status: ongoing   PLAN: PT FREQUENCY: 1-2x/week  PT DURATION: 12 weeks  PLANNED INTERVENTIONS: Therapeutic exercises, Therapeutic activity, Neuromuscular re-education, Balance training, Gait training, Patient/Family education, Joint mobilization, Stair training, Aquatic Therapy, Dry Needling, Spinal mobilization, Cryotherapy, Moist heat, Taping, Vasopneumatic device, Manual therapy, and Re-evaluation  PLAN FOR NEXT SESSION:  focus on normalizing gait and improving ROM  Itzel Mckibbin C. Trinh Sanjose PT, DPT 09/29/22 5:28 PM

## 2022-10-06 ENCOUNTER — Ambulatory Visit (HOSPITAL_BASED_OUTPATIENT_CLINIC_OR_DEPARTMENT_OTHER): Payer: BC Managed Care – PPO | Admitting: Physical Therapy

## 2022-10-06 DIAGNOSIS — R600 Localized edema: Secondary | ICD-10-CM

## 2022-10-06 DIAGNOSIS — R262 Difficulty in walking, not elsewhere classified: Secondary | ICD-10-CM

## 2022-10-06 DIAGNOSIS — M25672 Stiffness of left ankle, not elsewhere classified: Secondary | ICD-10-CM | POA: Diagnosis not present

## 2022-10-06 DIAGNOSIS — M25572 Pain in left ankle and joints of left foot: Secondary | ICD-10-CM

## 2022-10-06 NOTE — Therapy (Signed)
OUTPATIENT PHYSICAL THERAPY LOWER EXTREMITY TREATMENT   Patient Name: Erin Collins MRN: [id number] DOB:04-Mar-1976, 46 y.o., female Today's Date: 10/07/2022   PT End of Session - 10/07/22 1134     Visit Number 17    Number of Visits 25    Date for PT Re-Evaluation 11/18/22    Authorization Type BCBS    PT Start Time 1655    PT Stop Time 1730    PT Time Calculation (min) 35 min    Activity Tolerance Patient tolerated treatment well    Behavior During Therapy WFL for tasks assessed/performed                   Past Medical History:  Diagnosis Date   Anemia    Angio-edema    Asthma    Basal cell carcinoma ~ 1999   collarbone   Blood clot in vein ~ 2008   "left breast"    Complication of anesthesia    "I have trouble waking up afterwards" (12/18/2017)   Diarrhea 04/01/2019   Eczema    Family history of adverse reaction to anesthesia    "mom has trouble waking up afterwards" (12/18/2017)   Fluttering heart    "comes and goes" (12/18/2017)   GERD (gastroesophageal reflux disease)    Headache    "weekly" (12/18/2017)   History of kidney stones    Recurrent upper respiratory infection (URI)    Seizures ([place]) 1980 X 1   "bland stare"   Torticollis    Vertigo    Past Surgical History:  Procedure Laterality Date   ANKLE SURGERY Left 2023   [facility] [place] Right ~ 1999   "collarbone"   DILATION AND CURETTAGE OF UTERUS  2005   LAPAROSCOPIC ENDOMETRIOSIS FULGURATION  2005   NASAL SEPTOPLASTY W/ TURBINOPLASTY Bilateral 2014   [place]   TUBAL LIGATION Right 2005   TYMPANOSTOMY TUBE PLACEMENT     Patient Active Problem List   Diagnosis Date Noted   Gastroesophageal reflux disease 08/23/2022   Acute sinusitis 08/23/2022   Subacute cough 07/25/2022   Depression 07/25/2022   Urine discoloration 07/20/2022   HSV infection 04/28/2022   Headache    Snoring    Insomnia 08/19/2021   Not  well controlled moderate persistent asthma 04/16/2021   Sinusitis 10/08/2020   Osteoporosis 02/04/2020   Seasonal and perennial allergic rhinoconjunctivitis 12/31/2018   Other atopic dermatitis 11/28/2018   Multiple drug allergies 11/28/2018   Healthcare maintenance 05/06/2018    PCP: Starlyn Skeans, MD  REFERRING PROVIDER: Criselda Peaches, DPM  REFERRING DIAG:  [phone] (ICD-10-CM) - Gastrocnemius equinus of left lower extremity  M92.62 (ICD-10-CM) - Haglund's deformity of left heel  M76.62 (ICD-10-CM) - Tendonitis, Achilles, left  Z98.890 (ICD-10-CM) - Status post surgery  Post op Achilles reconstruction and repair 04/29/22. Can be toe touch WB in boot until 06/27/22. Then can progress to full WB in boot with wedges. OK to come out of boot for exercises gradually   THERAPY DIAG:  Pain in left ankle and joints of left foot  Stiffness of left ankle, not elsewhere classified  Difficulty in walking, not elsewhere classified  Localized edema  Rationale for Evaluation and Treatment Rehabilitation  ONSET DATE: DOS 04/29/22  SUBJECTIVE:   SUBJECTIVE STATEMENT: My lower back is burning when I walk.    PERTINENT HISTORY: Chronic h/o foot issues  PAIN:  Are you having pain? Yes:  NPRS scale: 1-2/10 Pain location: Lt ankle Pain description: achy, tight Aggravating factors: prolonged standing walking Relieving factors: ice  PRECAUTIONS: Other: ankle WB  WEIGHT BEARING RESTRICTIONS  Can be toe touch WB in boot until 06/27/22. Then can progress to full WB in boot with wedges. OK to come out of boot for exercises gradually 07/22/22:  can be out of boot completely with gait.    FALLS:  Has patient fallen in last 6 months? Yes. 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: Independent  PATIENT GOALS hiking (was walking 3-5 mi/day), bike rides (did 100 mile bike rides)   OBJECTIVE:    PATIENT SURVEYS:  FOTO 20 9/18: 40      SENSATION: WFL  EDEMA:   Circumferential: Left 29 cm around [facility]; Right 26 cm 9/18  Lt: 28 cm    PALPATION: Minimal mobility through foot and ankle due to edema  LOWER EXTREMITY ROM:  Active/passive ROM Left eval LEFT 7/12 LEFT 7/18 LEFT 7/25 LEFT Left 8/4 Left 8/11 Left  8/29 Left 9/18 Left 10/12  Ankle dorsiflexion -32/-25 -24 -16 -17/-10 -6 -15 -15/-8 -4/0 2 2 / 5  Ankle plantarflexion        41    Ankle inversion        24    Ankle eversion        10     (Blank rows = not tested)  LOWER EXTREMITY MMT: not tested at eval   TODAY'S TREATMENT: Treatment                            10/06/22:  Backward stepping on treadmil MANUAL: IASTM hamstrings; passive DF + AP mobs at end range Sidelying hip abd + extension Standing Lt heel raise for plantar fascia stretch Glut set in stepping    Treatment                            09/29/22:  Squat DF mob on slant board SLS on airex Mcconnell tape providing support to peroneal tendon distally post to lat maleolus Lateral stepping on/off airex Toe raises Retro stepping   10/4  TCJ grade IV post glide  Self STM gastroc and fibularis  Heel drop and raise at step 2x10 3s hold at bottom; staggered with L behind SL Shuttle leg press 2x10 100lbs 2s at bottom, max tolerable DF; or use white leg press with 50 lbs Standing fibularis stretch 30s 3x Standing lunge 15x each foot with table support    9/25  TCJ grade IV post glide    Heel drop and raise at step 2x10 3s hold at bottom Shuttle leg press 2x10 50lbs  Mini counter squat to end range at bar 2s pause at stretch 2x10 Sidestepping YTB 63f 2x SLS at bar 30s 3x Gastroc/soleus wall stretch 30s 3x  Self ankle DF mob with band video provided    09/05/22: IASTM gastroc and incision site Slant board distal tib AP mob with DF Backward stepping on treadmill Gait training- hip ext with midline toe off SLS  Vaso 15 min 34 deg  08/25/22: Manual edema mobilization with leg  elevated On slant board- AP talocrural mobs with movement- did 2 rounds of this, first completed and then went to mat for edema mob and then returned to mobs on board Gait on crutches- encouraging neutral posture and hip extension   PATIENT EDUCATION:  Education details: Geophysicist/field seismologist of condition, verbally reviewed HEP, exercise form/rationale  Person educated: Patient Education method: Explanation, Demonstration, Tactile cues, Verbal cues, and Handouts Education comprehension: verbalized understanding, returned demonstration, verbal cues required, tactile cues required, and needs further education   HOME EXERCISE PROGRAM: [email or web address]- prefers digital  ASSESSMENT:  CLINICAL IMPRESSION: Tends to bend forward at toe off due to lack of DF flexibility- worked to increase glut activation and plantar fascia stretch which decreased this and pt reported improvement in back pain.    OBJECTIVE IMPAIRMENTS Abnormal gait, decreased activity tolerance, decreased mobility, difficulty walking, decreased ROM, decreased strength, increased edema, increased muscle spasms, impaired sensation, improper body mechanics, postural dysfunction, and pain.   ACTIVITY LIMITATIONS carrying, standing, squatting, stairs, transfers, bathing, dressing, locomotion level, and caring for others  PARTICIPATION LIMITATIONS: meal prep, cleaning, laundry, driving, community activity, and occupation  PERSONAL FACTORS  asthma, osteoporosis  are also affecting patient's functional outcome.     GOALS: Goals reviewed with patient? Yes  SHORT TERM GOALS: Target date: 07/05/2022  Ankle DF to -10 or better Baseline: Goal status: achieved  2.  Demo proper gait pattern in alignment with WB precautions Baseline:  Goal status: achieved  3.  Resting pain <=7/10 Baseline:  Goal status: achieved   LONG TERM GOALS: Target date: 11/18/22  Pt will demo ability to perform at least 5 single leg heel raises with UE support for  balance Baseline:  Goal status: ongoing  2.  Pt will return to biking Baseline:  Goal status: ongoing  3.  Pt will return to hiking on trails with moderate difficulty Baseline:  Goal status: ongoing  4.  Pt to meet stated FOTO goal Baseline:  Goal status: ongoing  5.  Gross pain with ADLs <=3/10 Baseline:  Goal status:achieved with current level  6.  Demo normalized gait pattern Baseline:  Goal status: ongoing   PLAN: PT FREQUENCY: 1-2x/week  PT DURATION: 12 weeks  PLANNED INTERVENTIONS: Therapeutic exercises, Therapeutic activity, Neuromuscular re-education, Balance training, Gait training, Patient/Family education, Joint mobilization, Stair training, Aquatic Therapy, Dry Needling, Spinal mobilization, Cryotherapy, Moist heat, Taping, Vasopneumatic device, Manual therapy, and Re-evaluation  PLAN FOR NEXT SESSION:  focus on normalizing gait and improving ROM  Adarius Tigges C. Detria Cummings PT, DPT 10/07/22 11:39 AM

## 2022-10-07 ENCOUNTER — Encounter (HOSPITAL_BASED_OUTPATIENT_CLINIC_OR_DEPARTMENT_OTHER): Payer: Self-pay | Admitting: Physical Therapy

## 2022-10-13 ENCOUNTER — Ambulatory Visit (HOSPITAL_BASED_OUTPATIENT_CLINIC_OR_DEPARTMENT_OTHER): Payer: BC Managed Care – PPO | Admitting: Physical Therapy

## 2022-10-13 ENCOUNTER — Encounter (HOSPITAL_BASED_OUTPATIENT_CLINIC_OR_DEPARTMENT_OTHER): Payer: Self-pay | Admitting: Physical Therapy

## 2022-10-13 DIAGNOSIS — R262 Difficulty in walking, not elsewhere classified: Secondary | ICD-10-CM | POA: Diagnosis not present

## 2022-10-13 DIAGNOSIS — R600 Localized edema: Secondary | ICD-10-CM

## 2022-10-13 DIAGNOSIS — M25572 Pain in left ankle and joints of left foot: Secondary | ICD-10-CM | POA: Diagnosis not present

## 2022-10-13 DIAGNOSIS — M25672 Stiffness of left ankle, not elsewhere classified: Secondary | ICD-10-CM | POA: Diagnosis not present

## 2022-10-13 NOTE — Therapy (Signed)
OUTPATIENT PHYSICAL THERAPY LOWER EXTREMITY TREATMENT   Patient Name: Erin Collins MRN: [id number] DOB:Apr 28, 1976, 46 y.o., female Today's Date: 10/14/2022   PT End of Session - 10/13/22 1705     Visit Number 18    Number of Visits 25    Date for PT Re-Evaluation 11/18/22    Authorization Type BCBS    PT Start Time 1704    PT Stop Time 1736    PT Time Calculation (min) 32 min    Activity Tolerance Patient tolerated treatment well    Behavior During Therapy WFL for tasks assessed/performed                    Past Medical History:  Diagnosis Date   Anemia    Angio-edema    Asthma    Basal cell carcinoma ~ 1999   collarbone   Blood clot in vein ~ 2008   "left breast"    Complication of anesthesia    "I have trouble waking up afterwards" (12/18/2017)   Diarrhea 04/01/2019   Eczema    Family history of adverse reaction to anesthesia    "mom has trouble waking up afterwards" (12/18/2017)   Fluttering heart    "comes and goes" (12/18/2017)   GERD (gastroesophageal reflux disease)    Headache    "weekly" (12/18/2017)   History of kidney stones    Recurrent upper respiratory infection (URI)    Seizures ([place]) 1980 X 1   "bland stare"   Torticollis    Vertigo    Past Surgical History:  Procedure Laterality Date   ANKLE SURGERY Left 2023   [facility] Right ~ 1999   "collarbone"   DILATION AND CURETTAGE OF UTERUS  2005   LAPAROSCOPIC ENDOMETRIOSIS FULGURATION  2005   NASAL SEPTOPLASTY W/ TURBINOPLASTY Bilateral 2014   [place]   TUBAL LIGATION Right 2005   TYMPANOSTOMY TUBE PLACEMENT     Patient Active Problem List   Diagnosis Date Noted   Gastroesophageal reflux disease 08/23/2022   Acute sinusitis 08/23/2022   Subacute cough 07/25/2022   Depression 07/25/2022   Urine discoloration 07/20/2022   HSV infection 04/28/2022   Headache    Snoring    Insomnia 08/19/2021   Not  well controlled moderate persistent asthma 04/16/2021   Sinusitis 10/08/2020   Osteoporosis 02/04/2020   Seasonal and perennial allergic rhinoconjunctivitis 12/31/2018   Other atopic dermatitis 11/28/2018   Multiple drug allergies 11/28/2018   Healthcare maintenance 05/06/2018    PCP: Starlyn Skeans, MD  REFERRING PROVIDER: Criselda Peaches, DPM  REFERRING DIAG:  [phone] (ICD-10-CM) - Gastrocnemius equinus of left lower extremity  M92.62 (ICD-10-CM) - Haglund's deformity of left heel  M76.62 (ICD-10-CM) - Tendonitis, Achilles, left  Z98.890 (ICD-10-CM) - Status post surgery  Post op Achilles reconstruction and repair 04/29/22. Can be toe touch WB in boot until 06/27/22. Then can progress to full WB in boot with wedges. OK to come out of boot for exercises gradually   THERAPY DIAG:  Pain in left ankle and joints of left foot  Stiffness of left ankle, not elsewhere classified  Difficulty in walking, not elsewhere classified  Localized edema  Rationale for Evaluation and Treatment Rehabilitation  ONSET DATE: DOS 04/29/22  SUBJECTIVE:   SUBJECTIVE STATEMENT: Heel is sore and back is burning.    PERTINENT HISTORY: Chronic h/o foot issues  PAIN:  Are you having pain? Yes:  NPRS scale: 1-2/10 Pain location: Lt ankle Pain description: achy, tight Aggravating factors: prolonged standing walking Relieving factors: ice  PRECAUTIONS: Other: ankle WB  WEIGHT BEARING RESTRICTIONS  Can be toe touch WB in boot until 06/27/22. Then can progress to full WB in boot with wedges. OK to come out of boot for exercises gradually 07/22/22:  can be out of boot completely with gait.    FALLS:  Has patient fallen in last 6 months? Yes. 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: Independent  PATIENT GOALS hiking (was walking 3-5 mi/day), bike rides (did 100 mile bike rides)   OBJECTIVE:    PATIENT SURVEYS:  FOTO 20 9/18: 40      SENSATION: WFL  EDEMA:  Circumferential:  Left 29 cm around [facility]; Right 26 cm 9/18  Lt: 28 cm    PALPATION: Minimal mobility through foot and ankle due to edema  LOWER EXTREMITY ROM:  Active/passive ROM Left eval LEFT 7/12 LEFT 7/18 LEFT 7/25 LEFT Left 8/4 Left 8/11 Left  8/29 Left 9/18 Left 10/12  Ankle dorsiflexion -32/-25 -24 -16 -17/-10 -6 -15 -15/-8 -4/0 2 2 / 5  Ankle plantarflexion        41    Ankle inversion        24    Ankle eversion        10     (Blank rows = not tested)  LOWER EXTREMITY MMT: not tested at eval   TODAY'S TREATMENT: Treatment                            10/13/22:  MANUAL: STM Rt gluts/piriformis, bil lumbar paraspinals Prone hip ext with slight knee flexion Quadruped on forearms hip extension Bridge in dorsiflexion Plantar fascia stretch from qped Ankle DF mob in chair    Treatment                            10/06/22:  Backward stepping on treadmil MANUAL: IASTM hamstrings; passive DF + AP mobs at end range Sidelying hip abd + extension Standing Lt heel raise for plantar fascia stretch Glut set in stepping    Treatment                            09/29/22:  Squat DF mob on slant board SLS on airex Mcconnell tape providing support to peroneal tendon distally post to lat maleolus Lateral stepping on/off airex Toe raises Retro stepping    PATIENT EDUCATION:  Education details: Geophysicist/field seismologist of condition, verbally reviewed HEP, exercise form/rationale  Person educated: Patient Education method: Explanation, Demonstration, Tactile cues, Verbal cues, and Handouts Education comprehension: verbalized understanding, returned demonstration, verbal cues required, tactile cues required, and needs further education   HOME EXERCISE PROGRAM: [id number] prefers digital  ASSESSMENT:  CLINICAL IMPRESSION: Focused on glut activation with core stability to improve proximal posture in gait. Limited great toe ext.  OBJECTIVE IMPAIRMENTS Abnormal gait, decreased activity  tolerance, decreased mobility, difficulty walking, decreased ROM, decreased strength, increased edema, increased muscle spasms, impaired sensation, improper body mechanics, postural dysfunction, and pain.   ACTIVITY LIMITATIONS carrying, standing, squatting, stairs, transfers, bathing, dressing, locomotion level, and caring for others  PARTICIPATION LIMITATIONS: meal prep, cleaning, laundry, driving, community activity, and occupation  PERSONAL FACTORS  asthma, osteoporosis  are also affecting patient's functional outcome.     GOALS:  Goals reviewed with patient? Yes  SHORT TERM GOALS: Target date: 07/05/2022  Ankle DF to -10 or better Baseline: Goal status: achieved  2.  Demo proper gait pattern in alignment with WB precautions Baseline:  Goal status: achieved  3.  Resting pain <=7/10 Baseline:  Goal status: achieved   LONG TERM GOALS: Target date: 11/18/22  Pt will demo ability to perform at least 5 single leg heel raises with UE support for balance Baseline:  Goal status: ongoing  2.  Pt will return to biking Baseline:  Goal status: ongoing  3.  Pt will return to hiking on trails with moderate difficulty Baseline:  Goal status: ongoing  4.  Pt to meet stated FOTO goal Baseline:  Goal status: ongoing  5.  Gross pain with ADLs <=3/10 Baseline:  Goal status:achieved with current level  6.  Demo normalized gait pattern Baseline:  Goal status: ongoing   PLAN: PT FREQUENCY: 1-2x/week  PT DURATION: 12 weeks  PLANNED INTERVENTIONS: Therapeutic exercises, Therapeutic activity, Neuromuscular re-education, Balance training, Gait training, Patient/Family education, Joint mobilization, Stair training, Aquatic Therapy, Dry Needling, Spinal mobilization, Cryotherapy, Moist heat, Taping, Vasopneumatic device, Manual therapy, and Re-evaluation  PLAN FOR NEXT SESSION:  focus on normalizing gait and improving ROM; FOTO  Larrie Fraizer C. Tomislav Micale PT, DPT 10/14/22 8:25 AM

## 2022-10-20 ENCOUNTER — Ambulatory Visit (HOSPITAL_BASED_OUTPATIENT_CLINIC_OR_DEPARTMENT_OTHER): Payer: BC Managed Care – PPO | Admitting: Physical Therapy

## 2022-10-24 ENCOUNTER — Encounter: Payer: Self-pay | Admitting: Allergy

## 2022-10-24 ENCOUNTER — Telehealth: Payer: Self-pay

## 2022-10-24 ENCOUNTER — Ambulatory Visit (INDEPENDENT_AMBULATORY_CARE_PROVIDER_SITE_OTHER): Payer: BC Managed Care – PPO | Admitting: Allergy

## 2022-10-24 VITALS — Ht 64.0 in | Wt 195.0 lb

## 2022-10-24 DIAGNOSIS — Z889 Allergy status to unspecified drugs, medicaments and biological substances status: Secondary | ICD-10-CM | POA: Diagnosis not present

## 2022-10-24 DIAGNOSIS — J302 Other seasonal allergic rhinitis: Secondary | ICD-10-CM | POA: Diagnosis not present

## 2022-10-24 DIAGNOSIS — L2089 Other atopic dermatitis: Secondary | ICD-10-CM | POA: Diagnosis not present

## 2022-10-24 DIAGNOSIS — J4550 Severe persistent asthma, uncomplicated: Secondary | ICD-10-CM

## 2022-10-24 DIAGNOSIS — J018 Other acute sinusitis: Secondary | ICD-10-CM | POA: Diagnosis not present

## 2022-10-24 DIAGNOSIS — K219 Gastro-esophageal reflux disease without esophagitis: Secondary | ICD-10-CM

## 2022-10-24 DIAGNOSIS — H1013 Acute atopic conjunctivitis, bilateral: Secondary | ICD-10-CM

## 2022-10-24 DIAGNOSIS — H1010 Acute atopic conjunctivitis, unspecified eye: Secondary | ICD-10-CM

## 2022-10-24 MED ORDER — DOXYCYCLINE HYCLATE 100 MG PO TABS: 100.0000 mg | ORAL_TABLET | Freq: Two times a day (BID) | ORAL | 0 refills | Status: DC

## 2022-10-24 MED ORDER — PREDNISONE 10 MG PO TABS
ORAL_TABLET | ORAL | 0 refills | Status: DC
Start: 2022-10-24 — End: 2022-11-01

## 2022-10-24 NOTE — Progress Notes (Unsigned)
RE: Erin Collins MRN: [id number] DOB: July 08, 1976 Date of Telemedicine Visit: 10/24/2022  Referring provider: Starlyn Skeans, MD Primary care provider: Starlyn Skeans, MD  Chief Complaint: Sinusitis (Sx started off and on a couple months- head congestion, sinus headaches. ) and Cough (Couple week. Thick dark yellow mucus production x 4 days. )   Telemedicine Follow Up Visit via Telephone: I connected with Erin Collins for a follow up on 10/25/22 by telephone and verified that I am speaking with the correct person using two identifiers.   I discussed the limitations, risks, security and privacy concerns of performing an evaluation and management service by telephone and the availability of in person appointments. I also discussed with the patient that there may be a patient responsible charge related to this service. The patient expressed understanding and agreed to proceed.  Patient is at home/work.  Provider is at the office.  Visit start time: 12:07PM Visit end time: 12:17PM Insurance consent/check in by: front desk Medical consent and medical assistant/nurse: Erin Collins.  History of Present Illness: She is a 46 y.o. female, who is being followed for asthma, allergic rhinoconjunctivitis, atopic dermatitis and multiple drug allergies. Her previous allergy office visit was on 08/23/2022 with Erin Collins. Today is a new complaint visit of not feeling well .  Patient started to have URI symptoms with coughing with dark yellow/brownish yellow mucous, drainage through the nose. No fevers/chills.  Some sick contact at work. No fevers/chills. Patient did not test herself for covid-19 as family members tested negative.   Symptoms started last week and worsening.   Asthma Currently on Breo 22mg 1 puff once a day, Spiriva 1.233m 2 puffs once a day and Singulair daily.  Did not start FaBerna [place].  Patient is going to start budesonide tx. Using albuterol in the morning.    Acute  sinusitis Finished doxycyline in September and no more antibiotics since then.   Seasonal and perennial allergic rhinoconjunctivitis Taking singulair, allegra and Ryaltris. Nettipot.    Assessment and Plan: Erin Collins a [age].o. female with: Acute sinusitis/bronchitis URI symptoms again with discolored sputum x 1 week. No fevers/chills.  Start doxycycline '100mg'$  twice a day for 10 days. Take prednisone '40mg'$  daily x 2 days, '30mg'$  daily x 2 days, '20mg'$  daily x 2 days and '10mg'$  daily x 2 days.  Not well controlled severe persistent asthma Past history - Diagnosed with asthma over 19 years ago. Patient moved in with her parents who have cats and dogs at home during this time. Did not tolerate Trelegy, Breztri no as effective. Flares in the spring and fall.  Interim history - symptoms flaring with current infection. [place].  Daily controller medication(s):  Continue Breo 20062m1 puff once a day and rinse mouth after each use.  Continue Spiriva 1.49m1m puff once a day.  Continue Singulair '10mg'$  daily During upper respiratory infections/flares:  Start budesonide (Pulmicort) 0.'5mg'$  nebulizer for 1-2 weeks until your breathing symptoms return to baseline.  Pretreat with albuterol 2 puffs or albuterol nebulizer.  If you need to use your albuterol nebulizer machine back to back within 15-30 minutes with no relief then please go to the ER/urgent care for further evaluation.  May use albuterol rescue inhaler 2 puffs or nebulizer every 4 to 6 hours as needed for shortness of breath, chest tightness, coughing, and wheezing. May use albuterol rescue inhaler 2 puffs 5 to 15 minutes prior to strenuous physical activities. Monitor frequency of use.  Get spirometry  at next visit.  Seasonal and perennial allergic rhinoconjunctivitis Past history - Perennial rhinoconjunctivitis symptoms for the past 20 years with worsening in the spring and fall.  Allergy immunotherapy for 10 years with good  benefit. 2019 skin testing showed, positive to dust mites, cat, dog, grass and horse. 2 cats at home. 2022 bloodwork positive to dust mites, cat, dog, grass pollen. Borderline to cockroach, mouse and maple/box elder tree pollen.  Continue environmental control measures - especially regarding the cats. Continue Singulair (montelukast) '10mg'$  daily.  Use over the counter antihistamines such as Zyrtec (cetirizine), Claritin (loratadine), Allegra (fexofenadine), or Xyzal (levocetirizine) daily as needed. May take twice a day during allergy flares. May switch antihistamines every few months. Continue Ryaltris (olopatadine + mometasone nasal spray combination) 1-2 sprays per nostril twice a day.  Nasal saline spray (i.e., Simply Saline) or nasal saline lavage (i.e., NeilMed) is recommended as needed and prior to medicated nasal sprays. May use refresh eye drops.  Need asthma to be more stable before starting AIT.  Gastroesophageal reflux disease Continue lifestyle and dietary modifications. Continue famotidine '20mg'$  twice a day.  Multiple drug allergies Past history - reactions to amoxicillin, cefuroxime, hydrocodone, levofloxacin, morphine, oxycodone, and propoxyphene in the past.  Continue to avoid all these medications.  Consider penicillin skin testing/drug challenge in the future. More than 90% of patients outgrow their penicillin allergy.   Other atopic dermatitis Past history - Eucrisa not effective.  Continue proper skin care measures.  May use triamcinolone 0.1% ointment twice a day as needed for the hands. Do not use on the face, neck, armpits or groin area. Do not use more than 3 weeks in a row.   Return in about 2 weeks (around 11/07/2022).  Meds ordered this encounter  Medications   doxycycline (VIBRA-TABS) 100 MG tablet    Sig: Take 1 tablet (100 mg total) by mouth 2 (two) times daily.    Dispense:  20 tablet    Refill:  0   predniSONE (DELTASONE) 10 MG tablet    Sig: Take  prednisone '40mg'$  daily x 2 days, '30mg'$  daily x 2 days, '20mg'$  daily x 2 days and '10mg'$  daily x 2 days.    Dispense:  20 tablet    Refill:  0   Lab Orders  No laboratory test(s) ordered today    Diagnostics: None.  Medication List:  Current Outpatient Medications  Medication Sig Dispense Refill   albuterol (PROVENTIL) (2.5 MG/3ML) 0.083% nebulizer solution Take 3 mLs (2.5 mg total) by nebulization every 4 (four) hours as needed for wheezing or shortness of breath (coughing fits). 75 mL 2   budesonide (PULMICORT) 0.5 MG/2ML nebulizer solution Take 2 mLs (0.5 mg total) by nebulization in the morning and at bedtime. 120 mL 2   doxycycline (VIBRA-TABS) 100 MG tablet Take 1 tablet (100 mg total) by mouth 2 (two) times daily. 20 tablet 0   famotidine (PEPCID) 20 MG tablet Take 1 tablet (20 mg total) by mouth 2 (two) times daily. 60 tablet 3   fluticasone furoate-vilanterol (BREO ELLIPTA) 200-25 MCG/ACT AEPB Inhale 1 puff into the lungs daily. Rinse mouth after each use. 60 each 5   montelukast (SINGULAIR) 10 MG tablet Take 1 tablet (10 mg total) by mouth at bedtime. 30 tablet 5   predniSONE (DELTASONE) 10 MG tablet Take prednisone '40mg'$  daily x 2 days, '30mg'$  daily x 2 days, '20mg'$  daily x 2 days and '10mg'$  daily x 2 days. 20 tablet 0   sertraline (ZOLOFT) 25 MG  tablet Take 1 tablet (25 mg total) by mouth daily. 90 tablet 0   Spacer/Aero-Holding Chambers (EQ SPACE CHAMBER ANTI-STATIC) DEVI Inhale into the lungs as directed.     Tiotropium Bromide Monohydrate (SPIRIVA RESPIMAT) 1.25 MCG/ACT AERS Inhale 2 puffs into the lungs daily. 4 g 5   VENTOLIN HFA 108 (90 Base) MCG/ACT inhaler INHALE 1 TO 2 PUFFS BY MOUTH EVERY 6 HOURS AS NEEDED FOR WHEEZING FOR SHORTNESS OF BREATH 18 g 1   benzonatate (TESSALON PERLES) 100 MG capsule Take 1 capsule (100 mg total) by mouth 3 (three) times daily as needed for cough. 20 capsule 3   Olopatadine-Mometasone (RYALTRIS) 665-25 MCG/ACT SUSP Place 1-2 sprays into the nose in the  morning and at bedtime. (Patient not taking: Reported on 10/24/2022) 29 g 5   SUMAtriptan (IMITREX) 50 MG tablet TAKE 1 TABLET BY MOUTH ONCE AS NEEDED FOR UP TO 15 DAYS FOR MIGRAINE. MAY REPEAT IN 2 HOURS IF HEADACHE PERSISTS OR RECURS. (Patient not taking: Reported on 10/24/2022) 9 tablet 1   No current facility-administered medications for this visit.   Allergies: Allergies  Allergen Reactions   Amoxicillin Hives   Cefuroxime Axetil Hives   Hydrocodone Itching   Levofloxacin Other (See Comments)    tendonitis   Morphine Itching   Oxycodone-Acetaminophen Itching   Penicillins Hives    Has patient had a PCN reaction causing immediate rash, facial/tongue/throat swelling, SOB or lightheadedness with hypotension: Yes Has patient had a PCN reaction causing severe rash involving mucus membranes or skin necrosis: No Has patient had a PCN reaction that required hospitalization: No Has patient had a PCN reaction occurring within the last 10 years: No If all of the above answers are "NO", then may proceed with Cephalosporin use.    Propoxyphene Itching   I reviewed her past medical history, social history, family history, and environmental history and no significant changes have been reported from her previous visit.  Review of Systems  Constitutional:  Negative for appetite change, chills, fatigue, fever and unexpected weight change.  HENT:  Positive for congestion and rhinorrhea.   Eyes:  Negative for itching.  Respiratory:  Positive for cough, chest tightness, shortness of breath and wheezing.   Cardiovascular:  Negative for chest pain.  Gastrointestinal:  Negative for abdominal pain.  Genitourinary:  Negative for difficulty urinating.  Skin:  Negative for rash.  Allergic/Immunologic: Positive for environmental allergies. Negative for food allergies.    Objective: Physical Exam Not obtained as encounter was done via telephone.   Previous notes and tests were reviewed.  I discussed  the assessment and treatment plan with the patient. The patient was provided an opportunity to ask questions and all were answered. The patient agreed with the plan and demonstrated an understanding of the instructions. After visit summary/patient instructions available via mychart.   The patient was advised to call back or seek an in-person evaluation if the symptoms worsen or if the condition fails to improve as anticipated.  I provided 10 minutes of non-face-to-face time during this encounter.  It was my pleasure to participate in [place] care today. Please feel free to contact me with any questions or concerns.   Sincerely,  Rexene Alberts, DO Allergy & Immunology  [facility] office: [place] office: [phone]

## 2022-10-24 NOTE — Patient Instructions (Addendum)
Sinus infection/bronchitis Start doxycycline '100mg'$  twice a day for 10 days. Take prednisone '40mg'$  daily x 2 days, '30mg'$  daily x 2 days, '20mg'$  daily x 2 days and '10mg'$  daily x 2 days. See below for symptomatic management.  Moderate persistent asthma Start Fasenra as scheduled.  Daily controller medication(s):  Continue Breo 274mg 1 puff once a day and rinse mouth after each use.  Continue Spiriva 1.29m 2 puff once a day.  Continue Singulair '10mg'$  daily During upper respiratory infections/flares:  Start budesonide (Pulmicort) 0.'5mg'$  nebulizer for 1-2 weeks until your breathing symptoms return to baseline.  Pretreat with albuterol 2 puffs or albuterol nebulizer.  If you need to use your albuterol nebulizer machine back to back within 15-30 minutes with no relief then please go to the ER/urgent care for further evaluation.  May use albuterol rescue inhaler 2 puffs or nebulizer every 4 to 6 hours as needed for shortness of breath, chest tightness, coughing, and wheezing. May use albuterol rescue inhaler 2 puffs 5 to 15 minutes prior to strenuous physical activities. Monitor frequency of use.  Asthma control goals:  Full participation in all desired activities (may need albuterol before activity) Albuterol use two times or less a week on average (not counting use with activity) Cough interfering with sleep two times or less a month Oral steroids no more than once a year No hospitalizations    Seasonal and perennial allergic rhinoconjunctivitis 2022 bloodwork positive to dust mites, cat, dog, grass pollen. Borderline to cockroach, mouse and maple/box elder tree pollen.  Continue environmental control measures - especially regarding the cats. Continue Singulair (montelukast) '10mg'$  daily.  Use over the counter antihistamines such as Zyrtec (cetirizine), Claritin (loratadine), Allegra (fexofenadine), or Xyzal (levocetirizine) daily as needed. May take twice a day during allergy flares. May switch  antihistamines every few months. Continue Ryaltris (olopatadine + mometasone nasal spray combination) 1-2 sprays per nostril twice a day.  Nasal saline spray (i.e., Simply Saline) or nasal saline lavage (i.e., NeilMed) is recommended as needed and prior to medicated nasal sprays. May use refresh eye drops.    Other atopic dermatitis Continue proper skin care measures. May use triamcinolone 0.1% ointment twice a day as needed for the hands. Do not use on the face, neck, armpits or groin area. Do not use more than 3 weeks in a row.    Multiple drug allergies Continue to avoid medications on your allergy list.  Consider penicillin skin testing/drug challenge in the future. If interested we can schedule drug challenge to penicillin. You must be off antihistamines for 3-5 days before. Must be in good health and not ill. Plan on being in the office for 2-3 hours and must bring in the drug you want to do the oral challenge for - will send in prescription to pick up a few days before. You must call to schedule an appointment and specify it's for a drug challenge.  More than 90% of patients outgrow their penicillin allergy.    Heartburn Continue lifestyle and dietary modifications. Continue famotidine '20mg'$  twice a day.  Follow up as scheduled.  Drink plenty of fluids. Water, juice, clear broth or warm lemon water are good choices. Avoid caffeine and alcohol, which can dehydrate you. Eat chicken soup. Chicken soup and other warm fluids can be soothing and loosen congestion. Rest. Adjust your room's temperature and humidity. Keep your room warm but not overheated. If the air is dry, a cool-mist humidifier or vaporizer can moisten the air and help ease congestion and  coughing. Keep the humidifier clean to prevent the growth of bacteria and molds. Soothe your throat. Perform a saltwater gargle. Dissolve one-quarter to a half teaspoon of salt in a 4- to 8-ounce glass of warm water. This can relieve a  sore or scratchy throat temporarily. Use saline nasal drops. To help relieve nasal congestion, try saline nasal drops. You can buy these drops over the counter, and they can help relieve symptoms ? even in children. Take over-the-counter cold and cough medications. For adults and children older than 5, over-the-counter decongestants, antihistamines and pain relievers might offer some symptom relief. However, they won't prevent a cold or shorten its duration.

## 2022-10-24 NOTE — Telephone Encounter (Signed)
Requesting to speak with a nurse about getting an appt for today, states she is not feeling well. Please call pt back.

## 2022-10-24 NOTE — Telephone Encounter (Signed)
Returned call to patient. States she has a "bad sinus infection." It's difficult to sleep 2/2 cough and dark yellow sputum. Denies SHOB. She is given first available appt tomorrow at 3:45.

## 2022-10-25 ENCOUNTER — Ambulatory Visit: Payer: BC Managed Care – PPO | Admitting: Allergy

## 2022-10-25 ENCOUNTER — Encounter: Payer: Self-pay | Admitting: Internal Medicine

## 2022-10-25 ENCOUNTER — Ambulatory Visit: Payer: BC Managed Care – PPO | Admitting: Internal Medicine

## 2022-10-25 VITALS — BP 101/80 | HR 120 | Temp 98.3°F | Ht 64.0 in | Wt 198.2 lb

## 2022-10-25 DIAGNOSIS — J4550 Severe persistent asthma, uncomplicated: Secondary | ICD-10-CM | POA: Insufficient documentation

## 2022-10-25 DIAGNOSIS — F320 Major depressive disorder, single episode, mild: Secondary | ICD-10-CM | POA: Diagnosis not present

## 2022-10-25 DIAGNOSIS — J019 Acute sinusitis, unspecified: Secondary | ICD-10-CM | POA: Diagnosis not present

## 2022-10-25 DIAGNOSIS — R052 Subacute cough: Secondary | ICD-10-CM | POA: Diagnosis not present

## 2022-10-25 MED ORDER — BENZONATATE 100 MG PO CAPS: 100.0000 mg | ORAL_CAPSULE | Freq: Three times a day (TID) | ORAL | 3 refills | Status: DC | PRN

## 2022-10-25 NOTE — Assessment & Plan Note (Signed)
Past history - Eucrisa not effective.  Continue proper skin care measures.  May use triamcinolone 0.1% ointment twice a day as needed for the hands. Do not use on the face, neck, armpits or groin area. Do not use more than 3 weeks in a row.

## 2022-10-25 NOTE — Progress Notes (Unsigned)
Subjective:  CC: sinus congestion  HPI:  [name] is a 46 y.o. female with a past medical history stated below and presents today for sinus congestion. This started about 4 days ago. She has been treated for sinus infection 3 times this year. She had a tele-visit with her allergist 11/6 and was prescribed steroids and doxycycline.  She has had a cough for the last 3 weeks that makes it difficult to sleep at night. Please see problem based assessment and plan for additional details.  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 75 mL 2   budesonide (PULMICORT) 0.5 MG/2ML nebulizer solution Take 2 mLs (0.5 mg total) by nebulization in the morning and at bedtime. 120 mL 2   doxycycline (VIBRA-TABS) 100 MG tablet Take 1 tablet (100 mg total) by mouth 2 (two) times daily. 20 tablet 0   famotidine (PEPCID) 20 MG tablet Take 1 tablet (20 mg total) by mouth 2 (two) times daily. 60 tablet 3   fluticasone furoate-vilanterol (BREO ELLIPTA) 200-25 MCG/ACT AEPB Inhale 1 puff into the lungs daily. Rinse  mouth after each use. 60 each 5   montelukast (SINGULAIR) 10 MG tablet Take 1 tablet (10 mg total) by mouth at bedtime. 30 tablet 5   Olopatadine-Mometasone (RYALTRIS) 665-25 MCG/ACT SUSP Place 1-2 sprays into the nose in the morning and at bedtime. (Patient not taking: Reported on 10/24/2022) 29 g 5   predniSONE (DELTASONE) 10 MG tablet Take prednisone '40mg'$  daily x 2 days, '30mg'$  daily x 2 days, '20mg'$  daily x 2 days and '10mg'$  daily x 2 days. 20 tablet 0   sertraline (ZOLOFT) 25 MG tablet Take 1 tablet (25 mg total) by mouth daily. 90 tablet 0   Spacer/Aero-Holding Chambers (EQ SPACE CHAMBER ANTI-STATIC) DEVI Inhale into the lungs as directed.     SUMAtriptan (IMITREX) 50 MG tablet TAKE 1 TABLET BY MOUTH ONCE AS NEEDED FOR UP TO 15 DAYS FOR MIGRAINE. MAY REPEAT IN 2 HOURS IF HEADACHE PERSISTS OR RECURS. (Patient not taking: Reported on 10/24/2022) 9 tablet 1   Tiotropium Bromide Monohydrate (SPIRIVA RESPIMAT) 1.25 MCG/ACT AERS Inhale 2 puffs into the lungs daily. 4 g 5   VENTOLIN HFA 108 (90 Base) MCG/ACT inhaler INHALE 1 TO 2 PUFFS BY MOUTH EVERY 6 HOURS AS NEEDED FOR WHEEZING FOR SHORTNESS OF BREATH 18 g 1   No current facility-administered medications on file prior to visit.  Family History  Problem Relation Age of Onset   Allergic rhinitis Mother    Asthma Mother     Social History   Socioeconomic History   Marital status: Married    Spouse name: Not on file   Number of children: Not on file   Years of education: Not on file   Highest education level: Not on file  Occupational History   Not on file  Tobacco Use   Smoking status: Never    Passive exposure: Never   Smokeless tobacco: Never  Vaping Use   Vaping Use: Never used  Substance and Sexual Activity   Alcohol use: Not Currently    Comment: 12/18/2017 "maybe 1/year on my birthday"  Rarely.   Drug use: No   Sexual activity: Yes    Partners: Male    Birth control/protection: None, OCP  Other Topics Concern   Not on file   Social History Narrative   Caffeine; large soda daily (20oz).    Education : HS some college.   Working:  Administrator, sports.    Social Determinants of Health   Financial Resource Strain: Not on file  Food Insecurity: No Food Insecurity (07/21/2021)   Hunger Vital Sign    Worried About Running Out of Food in the Last Year: Never true    [place] in the Last Year: Never true  Transportation Needs: No Transportation Needs (07/21/2021)   PRAPARE - Hydrologist (Medical): No    Lack of Transportation (Non-Medical): No  Physical Activity: Not on file  Stress: Not on file  Social Connections: Not on file  Intimate Partner Violence: Not on file    Review of Systems: ROS negative except for what is noted on the assessment and plan.  Objective:   Vitals:   10/25/22 1617  BP: 101/80  Pulse: (!) 120  Temp: 98.3 F (36.8 C)  TempSrc: Oral  SpO2: 96%  Weight: 198 lb 3.2 oz (89.9 kg)  Height: '5\' 4"'$  (1.626 m)    Physical Exam: Constitutional: well-appearing  Cardiovascular: regular rate and rhythm, no m/r/g Pulmonary/Chest: normal work of breathing on room air, lungs clear to auscultation bilaterally Abdominal: soft, non-tender, non-distended MSK: normal bulk and tone Skin: warm and dry   Assessment & Plan:  Sinusitis Sinus congestion started about 4 days ago. She has tele visit with allergist 11/6 and was prescribed steroid and doxycycline. She is tolerating fluids well, but appetite is decreased. She endorses feeling more short of breath and plans to start budesonide nebulizer treatments at home.  -Continue sinus rinse -encouraged fluids -continue steroids and antibiotics  Subacute cough She follows with allergist due to severe persistent asthma. Telehealth visit completed 11/6. She has had non-productive cough for last 3 weeks since the weather changed. She has tried robitussin without relief. She feels more short of breath. She has used tessalon  pearls in the past with improvement in symptoms. A: Subacute cough likely 2/2 to asthma. No wheezing or rhonchi appreciated on exam. Her oxygen saturation are 96%. P: Continue steroids, Breo, spiriva, and budesonide nebs Plan to start injections of benralizumab with allergist next week Tessalon pearls   Depression Follow-up at next office visit    Patient discussed with Dr. Angelia Mould   Makinsley Schiavi, D.O. [facility]  PGY-2 Pager: [phone]  Phone: [phone] Date 10/27/2022  Time 3:01 PM

## 2022-10-25 NOTE — Assessment & Plan Note (Signed)
Past history - Diagnosed with asthma over 19 years ago. Patient moved in with her parents who have cats and dogs at home during this time. Did not tolerate Trelegy, Breztri no as effective. Flares in the spring and fall.  Interim history - symptoms flaring with current infection. Start [place] as scheduled.  Daily controller medication(s):  Continue Breo 215mg 1 puff once a day and rinse mouth after each use.  Continue Spiriva 1.229m 2 puff once a day.  Continue Singulair '10mg'$  daily During upper respiratory infections/flares:  Start budesonide (Pulmicort) 0.'5mg'$  nebulizer for 1-2 weeks until your breathing symptoms return to baseline.  Pretreat with albuterol 2 puffs or albuterol nebulizer.  If you need to use your albuterol nebulizer machine back to back within 15-30 minutes with no relief then please go to the ER/urgent care for further evaluation.  May use albuterol rescue inhaler 2 puffs or nebulizer every 4 to 6 hours as needed for shortness of breath, chest tightness, coughing, and wheezing. May use albuterol rescue inhaler 2 puffs 5 to 15 minutes prior to strenuous physical activities. Monitor frequency of use.  Get spirometry at next visit.

## 2022-10-25 NOTE — Patient Instructions (Signed)
Thank you, Ms.Tashanti Amika Tassin for allowing [place] to provide your care today.   I have sent in some tessalon pearls. I hope that this helps with your cough and allows you to get some rest.   Abdominal discomfort I am wondering if some of this sensation is related to how much you have been coughing. If symptoms persist after your sinus infection, please come back in to be evaluated.  I have ordered the following labs for you:  Lab Orders  No laboratory test(s) ordered today    Referrals ordered today:   Referral Orders  No referral(s) requested today     I have ordered the following medication/changed the following medications:   Stop the following medications: There are no discontinued medications.   Start the following medications: Meds ordered this encounter  Medications   benzonatate (TESSALON PERLES) 100 MG capsule    Sig: Take 1 capsule (100 mg total) by mouth 3 (three) times daily as needed for cough.    Dispense:  20 capsule    Refill:  3     Follow up:  2 weeks    We look forward to seeing you next time. Please call our clinic at [phone] if you have any questions or concerns. The best time to call is Monday-Friday from 9am-4pm, but there is someone available 24/7. If after hours or the weekend, call the main hospital number and ask for the Internal Medicine Resident On-Call. If you need medication refills, please notify your pharmacy one week in advance and they will send [place] a request.   Thank you for trusting me with your care. Wishing you the best!   Christiana Fuchs, [place]

## 2022-10-25 NOTE — Assessment & Plan Note (Signed)
URI symptoms again with discolored sputum x 1 week. No fevers/chills.  Start doxycycline '100mg'$  twice a day for 10 days. Take prednisone '40mg'$  daily x 2 days, '30mg'$  daily x 2 days, '20mg'$  daily x 2 days and '10mg'$  daily x 2 days.

## 2022-10-25 NOTE — Assessment & Plan Note (Signed)
Past history - reactions to amoxicillin, cefuroxime, hydrocodone, levofloxacin, morphine, oxycodone, and propoxyphene in the past.  Continue to avoid all these medications.  Consider penicillin skin testing/drug challenge in the future. More than 90% of patients outgrow their penicillin allergy.  

## 2022-10-25 NOTE — Assessment & Plan Note (Signed)
Continue lifestyle and dietary modifications. Continue famotidine '20mg'$  twice a day.

## 2022-10-25 NOTE — Assessment & Plan Note (Signed)
Past history - Perennial rhinoconjunctivitis symptoms for the past 20 years with worsening in the spring and fall.  Allergy immunotherapy for 10 years with good benefit. 2019 skin testing showed, positive to dust mites, cat, dog, grass and horse. 2 cats at home. 2022 bloodwork positive to dust mites, cat, dog, grass pollen. Borderline to cockroach, mouse and maple/box elder tree pollen.  Continue environmental control measures - especially regarding the cats. Continue Singulair (montelukast) '10mg'$  daily.  Use over the counter antihistamines such as Zyrtec (cetirizine), Claritin (loratadine), Allegra (fexofenadine), or Xyzal (levocetirizine) daily as needed. May take twice a day during allergy flares. May switch antihistamines every few months. Continue Ryaltris (olopatadine + mometasone nasal spray combination) 1-2 sprays per nostril twice a day.  Nasal saline spray (i.e., Simply Saline) or nasal saline lavage (i.e., NeilMed) is recommended as needed and prior to medicated nasal sprays. May use refresh eye drops.  Need asthma to be more stable before starting AIT.

## 2022-10-27 ENCOUNTER — Ambulatory Visit (HOSPITAL_BASED_OUTPATIENT_CLINIC_OR_DEPARTMENT_OTHER): Payer: BC Managed Care – PPO | Attending: Podiatry | Admitting: Physical Therapy

## 2022-10-27 DIAGNOSIS — R262 Difficulty in walking, not elsewhere classified: Secondary | ICD-10-CM | POA: Diagnosis not present

## 2022-10-27 DIAGNOSIS — M25572 Pain in left ankle and joints of left foot: Secondary | ICD-10-CM | POA: Diagnosis not present

## 2022-10-27 DIAGNOSIS — R600 Localized edema: Secondary | ICD-10-CM | POA: Diagnosis not present

## 2022-10-27 DIAGNOSIS — M25672 Stiffness of left ankle, not elsewhere classified: Secondary | ICD-10-CM | POA: Insufficient documentation

## 2022-10-27 NOTE — Assessment & Plan Note (Signed)
Follow-up at next office visit

## 2022-10-27 NOTE — Assessment & Plan Note (Signed)
Sinus congestion started about 4 days ago. She has tele visit with allergist 11/6 and was prescribed steroid and doxycycline. She is tolerating fluids well, but appetite is decreased. She endorses feeling more short of breath and plans to start budesonide nebulizer treatments at home.  -Continue sinus rinse -encouraged fluids -continue steroids and antibiotics

## 2022-10-27 NOTE — Assessment & Plan Note (Addendum)
She follows with allergist due to severe persistent asthma. Telehealth visit completed 11/6. She has had non-productive cough for last 3 weeks since the weather changed. She has tried robitussin without relief. She feels more short of breath. She has used tessalon pearls in the past with improvement in symptoms. A: Subacute cough likely 2/2 to asthma. No wheezing or rhonchi appreciated on exam. Her oxygen saturation are 96%. P: Continue steroids, Breo, spiriva, and budesonide nebs Plan to start injections of benralizumab with allergist next week Tessalon pearls

## 2022-10-27 NOTE — Therapy (Signed)
OUTPATIENT PHYSICAL THERAPY LOWER EXTREMITY TREATMENT   Patient Name: Erin Collins MRN: [id number] DOB:Jul 15, 1976, 46 y.o., female Today's Date: 10/28/2022   PT End of Session - 10/28/22 0749     Visit Number 19    Number of Visits 25    Date for PT Re-Evaluation 11/18/22    Authorization Type BCBS    PT Start Time 1657    PT Stop Time 1728    PT Time Calculation (min) 31 min    Activity Tolerance Patient tolerated treatment well    Behavior During Therapy WFL for tasks assessed/performed                     Past Medical History:  Diagnosis Date   Anemia    Angio-edema    Asthma    Basal cell carcinoma ~ 1999   collarbone   Blood clot in vein ~ 2008   "left breast"    Complication of anesthesia    "I have trouble waking up afterwards" (12/18/2017)   Diarrhea 04/01/2019   Eczema    Family history of adverse reaction to anesthesia    "mom has trouble waking up afterwards" (12/18/2017)   Fluttering heart    "comes and goes" (12/18/2017)   GERD (gastroesophageal reflux disease)    Headache    "weekly" (12/18/2017)   History of kidney stones    Recurrent upper respiratory infection (URI)    Seizures ([place]) 1980 X 1   "bland stare"   Torticollis    Vertigo    Past Surgical History:  Procedure Laterality Date   ANKLE SURGERY Left 2023   [facility] Right ~ 1999   "collarbone"   DILATION AND CURETTAGE OF UTERUS  2005   LAPAROSCOPIC ENDOMETRIOSIS FULGURATION  2005   NASAL SEPTOPLASTY W/ TURBINOPLASTY Bilateral 2014   [place]   TUBAL LIGATION Right 2005   TYMPANOSTOMY TUBE PLACEMENT     Patient Active Problem List   Diagnosis Date Noted   Not well controlled severe persistent asthma 10/25/2022   Gastroesophageal reflux disease 08/23/2022   Acute sinusitis/bronchitis 08/23/2022   Subacute cough 07/25/2022   Depression 07/25/2022   Urine discoloration 07/20/2022   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   Headache    Snoring    Insomnia 08/19/2021   Sinusitis 10/08/2020   Osteoporosis 02/04/2020   Seasonal and perennial allergic rhinoconjunctivitis 12/31/2018   Other atopic dermatitis 11/28/2018   Multiple drug allergies 11/28/2018   Healthcare maintenance 05/06/2018    PCP: Starlyn Skeans, MD  REFERRING PROVIDER: Criselda Peaches, DPM  REFERRING DIAG:  [id number] (ICD-10-CM) - Gastrocnemius equinus of left lower extremity  M92.62 (ICD-10-CM) - Haglund's deformity of left heel  M76.62 (ICD-10-CM) - Tendonitis, Achilles, left  Z98.890 (ICD-10-CM) - Status post surgery  Post op Achilles reconstruction and repair 04/29/22. Can be toe touch WB in boot until 06/27/22. Then can progress to full WB in boot with wedges. OK to come out of boot for exercises gradually   THERAPY DIAG:  Pain in left ankle and joints of left foot  Stiffness of left ankle, not elsewhere classified  Difficulty in walking, not elsewhere classified  Localized edema  Rationale for Evaluation and Treatment Rehabilitation  ONSET DATE: DOS 04/29/22  SUBJECTIVE:   SUBJECTIVE STATEMENT: I feel so good I am ready to start working out again. I am still careful in grass. Some discomfort at head of  first metatarsal when I step on it.    PERTINENT HISTORY: Chronic h/o foot issues  PAIN:  Are you having pain? Yes: NPRS scale: 1-2/10 Pain location: Lt ankle Pain description: achy, tight Aggravating factors: prolonged standing walking Relieving factors: ice  PRECAUTIONS: Other: ankle WB  WEIGHT BEARING RESTRICTIONS  Can be toe touch WB in boot until 06/27/22. Then can progress to full WB in boot with wedges. OK to come out of boot for exercises gradually 07/22/22:  can be out of boot completely with gait.    FALLS:  Has patient fallen in last 6 months? Yes. 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: Independent  PATIENT GOALS hiking (was walking 3-5 mi/day), bike rides (did 100  mile bike rides)   OBJECTIVE:    PATIENT SURVEYS:  FOTO 20 9/18: 40      SENSATION: WFL  EDEMA:  Circumferential: Left 29 cm around [facility]; Right 26 cm 9/18  Lt: 28 cm    PALPATION: Minimal mobility through foot and ankle due to edema  LOWER EXTREMITY ROM:  Active/passive ROM Left eval LEFT 7/12 LEFT 7/18 LEFT 7/25 LEFT Left 8/4 Left 8/11 Left  8/29 Left 9/18 Left 10/12  Ankle dorsiflexion -32/-25 -24 -16 -17/-10 -6 -15 -15/-8 -4/0 2 2 / 5  Ankle plantarflexion        41    Ankle inversion        24    Ankle eversion        10     (Blank rows = not tested)  LOWER EXTREMITY MMT: not tested at eval   TODAY'S TREATMENT: Treatment                            10/27/22:  MANUAL: IASTM plantar fascia and gastroc in prone, paired with great toe extension passive stretch Seated heel raise for plantar fascia stretch Standing heel raises with chair for UE support/balance 4" step- lateral eccentric step down, forward/back step over, lateral step over to each side Squat to table tap- table at lowest point Lunges at sink   Treatment                            10/13/22:  MANUAL: STM Rt gluts/piriformis, bil lumbar paraspinals Prone hip ext with slight knee flexion Quadruped on forearms hip extension Bridge in dorsiflexion Plantar fascia stretch from qped Ankle DF mob in chair    Treatment                            10/06/22:  Backward stepping on treadmil MANUAL: IASTM hamstrings; passive DF + AP mobs at end range Sidelying hip abd + extension Standing Lt heel raise for plantar fascia stretch Glut set in stepping     PATIENT EDUCATION:  Education details: Geophysicist/field seismologist of condition, verbally reviewed HEP, exercise form/rationale  Person educated: Patient Education method: Explanation, Demonstration, Tactile cues, Verbal cues, and Handouts Education comprehension: verbalized understanding, returned demonstration, verbal cues required, tactile cues required,  and needs further education   HOME EXERCISE PROGRAM: [id number] prefers digital  ASSESSMENT:  CLINICAL IMPRESSION: Significant improvement in gait pattern as ROM/flexibility improve. Asked that she utilize the elliptical for cardioworkout rather than walking right now due to tendency to compensate.   OBJECTIVE IMPAIRMENTS Abnormal gait, decreased activity tolerance, decreased mobility, difficulty walking, decreased ROM, decreased strength,  increased edema, increased muscle spasms, impaired sensation, improper body mechanics, postural dysfunction, and pain.   ACTIVITY LIMITATIONS carrying, standing, squatting, stairs, transfers, bathing, dressing, locomotion level, and caring for others  PARTICIPATION LIMITATIONS: meal prep, cleaning, laundry, driving, community activity, and occupation  PERSONAL FACTORS  asthma, osteoporosis  are also affecting patient's functional outcome.     GOALS: Goals reviewed with patient? Yes  SHORT TERM GOALS: Target date: 07/05/2022  Ankle DF to -10 or better Baseline: Goal status: achieved  2.  Demo proper gait pattern in alignment with WB precautions Baseline:  Goal status: achieved  3.  Resting pain <=7/10 Baseline:  Goal status: achieved   LONG TERM GOALS: Target date: 11/18/22  Pt will demo ability to perform at least 5 single leg heel raises with UE support for balance Baseline:  Goal status: ongoing  2.  Pt will return to biking Baseline:  Goal status: ongoing  3.  Pt will return to hiking on trails with moderate difficulty Baseline:  Goal status: ongoing  4.  Pt to meet stated FOTO goal Baseline:  Goal status: ongoing  5.  Gross pain with ADLs <=3/10 Baseline:  Goal status:achieved with current level  6.  Demo normalized gait pattern Baseline:  Goal status: ongoing   PLAN: PT FREQUENCY: 1-2x/week  PT DURATION: 12 weeks  PLANNED INTERVENTIONS: Therapeutic exercises, Therapeutic activity, Neuromuscular re-education,  Balance training, Gait training, Patient/Family education, Joint mobilization, Stair training, Aquatic Therapy, Dry Needling, Spinal mobilization, Cryotherapy, Moist heat, Taping, Vasopneumatic device, Manual therapy, and Re-evaluation  PLAN FOR NEXT SESSION:  focus on normalizing gait and improving ROM; FOTO, progress balance to unstable surfaces  Ellanore Vanhook C. Sativa Gelles PT, DPT 10/28/22 7:49 AM

## 2022-10-28 ENCOUNTER — Encounter (HOSPITAL_BASED_OUTPATIENT_CLINIC_OR_DEPARTMENT_OTHER): Payer: Self-pay | Admitting: Physical Therapy

## 2022-10-28 NOTE — Progress Notes (Signed)
Internal Medicine Clinic Attending  Case discussed with the resident at the time of the visit.  We reviewed the resident's history and exam and pertinent patient test results.  I agree with the assessment, diagnosis, and plan of care documented in the resident's note.  

## 2022-10-31 NOTE — Progress Notes (Unsigned)
Follow Up Note  RE: Erin Collins MRN: [id number] DOB: [birth year] Date of Office Visit: 11/01/2022  Referring provider: Starlyn Skeans, MD Primary care provider: Starlyn Skeans, MD  Chief Complaint: No chief complaint on file.  History of Present Illness: I had the pleasure of seeing Erin Collins for a follow up visit at the [facility] on 10/31/2022. She is a 46 y.o. female, who is being followed for asthma, allergic rhinoconjunctivitis, GERD, multiple drug allergies and atopic dermatitis. Her previous allergy office visit was on 10/24/2022 with Dr. Maudie Mercury via telemedicine. Today is a regular follow up visit.  Acute sinusitis/bronchitis URI symptoms again with discolored sputum x 1 week. No fevers/chills.  Start doxycycline '100mg'$  twice a day for 10 days. Take prednisone '40mg'$  daily x 2 days, '30mg'$  daily x 2 days, '20mg'$  daily x 2 days and '10mg'$  daily x 2 days.   Not well controlled severe persistent asthma Past history - Diagnosed with asthma over 19 years ago. Patient moved in with her parents who have cats and dogs at home during this time. Did not tolerate Trelegy, Breztri no as effective. Flares in the spring and fall.  Interim history - symptoms flaring with current infection. Start [place] as scheduled.  Daily controller medication(s):  Continue Breo 266mg 1 puff once a day and rinse mouth after each use.  Continue Spiriva 1.247m 2 puff once a day.  Continue Singulair '10mg'$  daily During upper respiratory infections/flares:  Start budesonide (Pulmicort) 0.'5mg'$  nebulizer for 1-2 weeks until your breathing symptoms return to baseline.  Pretreat with albuterol 2 puffs or albuterol nebulizer.  If you need to use your albuterol nebulizer machine back to back within 15-30 minutes with no relief then please go to the ER/urgent care for further evaluation.  May use albuterol rescue inhaler 2 puffs or nebulizer every 4 to 6 hours as needed for shortness of breath, chest tightness,  coughing, and wheezing. May use albuterol rescue inhaler 2 puffs 5 to 15 minutes prior to strenuous physical activities. Monitor frequency of use.  Get spirometry at next visit.   Seasonal and perennial allergic rhinoconjunctivitis Past history - Perennial rhinoconjunctivitis symptoms for the past 20 years with worsening in the spring and fall.  Allergy immunotherapy for 10 years with good benefit. 2019 skin testing showed, positive to dust mites, cat, dog, grass and horse. 2 cats at home. 2022 bloodwork positive to dust mites, cat, dog, grass pollen. Borderline to cockroach, mouse and maple/box elder tree pollen.  Continue environmental control measures - especially regarding the cats. Continue Singulair (montelukast) '10mg'$  daily.  Use over the counter antihistamines such as Zyrtec (cetirizine), Claritin (loratadine), Allegra (fexofenadine), or Xyzal (levocetirizine) daily as needed. May take twice a day during allergy flares. May switch antihistamines every few months. Continue Ryaltris (olopatadine + mometasone nasal spray combination) 1-2 sprays per nostril twice a day.  Nasal saline spray (i.e., Simply Saline) or nasal saline lavage (i.e., NeilMed) is recommended as needed and prior to medicated nasal sprays. May use refresh eye drops.  Need asthma to be more stable before starting AIT.   Gastroesophageal reflux disease Continue lifestyle and dietary modifications. Continue famotidine '20mg'$  twice a day.   Multiple drug allergies Past history - reactions to amoxicillin, cefuroxime, hydrocodone, levofloxacin, morphine, oxycodone, and propoxyphene in the past.  Continue to avoid all these medications.  Consider penicillin skin testing/drug challenge in the future. More than 90% of patients outgrow their penicillin allergy.    Other atopic dermatitis Past  history - Eucrisa not effective.  Continue proper skin care measures.  May use triamcinolone 0.1% ointment twice a day as needed for the  hands. Do not use on the face, neck, armpits or groin area. Do not use more than 3 weeks in a row.    Return in about 2 weeks (around 11/07/2022).  Assessment and Plan: Erin Collins is a 46 y.o. female with: No problem-specific Assessment & Plan notes found for this encounter.  No follow-ups on file.  No orders of the defined types were placed in this encounter.  Lab Orders  No laboratory test(s) ordered today    Diagnostics: Spirometry:  Tracings reviewed. Her effort: {Blank single:19197::"Good reproducible efforts.","It was hard to get consistent efforts and there is a question as to whether this reflects a maximal maneuver.","Poor effort, data can not be interpreted."} FVC: ***L FEV1: ***L, ***% predicted FEV1/FVC ratio: ***% Interpretation: {Blank single:19197::"Spirometry consistent with mild obstructive disease","Spirometry consistent with moderate obstructive disease","Spirometry consistent with severe obstructive disease","Spirometry consistent with possible restrictive disease","Spirometry consistent with mixed obstructive and restrictive disease","Spirometry uninterpretable due to technique","Spirometry consistent with normal pattern","No overt abnormalities noted given today's efforts"}.  Please see scanned spirometry results for details.  Skin Testing: {Blank single:19197::"Select foods","Environmental allergy panel","Environmental allergy panel and select foods","Food allergy panel","None","Deferred due to recent antihistamines use"}. *** Results discussed with patient/family.   Medication List:  Current Outpatient Medications  Medication Sig Dispense Refill  . albuterol (PROVENTIL) (2.5 MG/3ML) 0.083% nebulizer solution Take 3 mLs (2.5 mg total) by nebulization every 4 (four) hours as needed for wheezing or shortness of breath (coughing fits). 75 mL 2  . benzonatate (TESSALON PERLES) 100 MG capsule Take 1 capsule (100 mg total) by mouth 3 (three) times daily as needed for  cough. 20 capsule 3  . budesonide (PULMICORT) 0.5 MG/2ML nebulizer solution Take 2 mLs (0.5 mg total) by nebulization in the morning and at bedtime. 120 mL 2  . doxycycline (VIBRA-TABS) 100 MG tablet Take 1 tablet (100 mg total) by mouth 2 (two) times daily. 20 tablet 0  . famotidine (PEPCID) 20 MG tablet Take 1 tablet (20 mg total) by mouth 2 (two) times daily. 60 tablet 3  . fluticasone furoate-vilanterol (BREO ELLIPTA) 200-25 MCG/ACT AEPB Inhale 1 puff into the lungs daily. Rinse mouth after each use. 60 each 5  . montelukast (SINGULAIR) 10 MG tablet Take 1 tablet (10 mg total) by mouth at bedtime. 30 tablet 5  . Olopatadine-Mometasone (RYALTRIS) [id number] MCG/ACT SUSP Place 1-2 sprays into the nose in the morning and at bedtime. (Patient not taking: Reported on 10/24/2022) 29 g 5  . predniSONE (DELTASONE) 10 MG tablet Take prednisone '40mg'$  daily x 2 days, '30mg'$  daily x 2 days, '20mg'$  daily x 2 days and '10mg'$  daily x 2 days. 20 tablet 0  . sertraline (ZOLOFT) 25 MG tablet Take 1 tablet (25 mg total) by mouth daily. 90 tablet 0  . Spacer/Aero-Holding Chambers (EQ SPACE CHAMBER ANTI-STATIC) DEVI Inhale into the lungs as directed.    . SUMAtriptan (IMITREX) 50 MG tablet TAKE 1 TABLET BY MOUTH ONCE AS NEEDED FOR UP TO 15 DAYS FOR MIGRAINE. MAY REPEAT IN 2 HOURS IF HEADACHE PERSISTS OR RECURS. (Patient not taking: Reported on 10/24/2022) 9 tablet 1  . Tiotropium Bromide Monohydrate (SPIRIVA RESPIMAT) 1.25 MCG/ACT AERS Inhale 2 puffs into the lungs daily. 4 g 5  . VENTOLIN HFA 108 (90 Base) MCG/ACT inhaler INHALE 1 TO 2 PUFFS BY MOUTH EVERY 6 HOURS AS NEEDED FOR WHEEZING FOR SHORTNESS  OF BREATH 18 g 1   No current facility-administered medications for this visit.   Allergies: Allergies  Allergen Reactions  . Amoxicillin Hives  . Cefuroxime Axetil Hives  . Hydrocodone Itching  . Levofloxacin Other (See Comments)    tendonitis  . Morphine Itching  . Oxycodone-Acetaminophen Itching  . Penicillins Hives     Has patient had a PCN reaction causing immediate rash, facial/tongue/throat swelling, SOB or lightheadedness with hypotension: Yes Has patient had a PCN reaction causing severe rash involving mucus membranes or skin necrosis: No Has patient had a PCN reaction that required hospitalization: No Has patient had a PCN reaction occurring within the last 10 years: No If all of the above answers are "NO", then may proceed with Cephalosporin use.   Marland Kitchen Propoxyphene Itching   I reviewed her past medical history, social history, family history, and environmental history and no significant changes have been reported from her previous visit.  Review of Systems  Constitutional:  Negative for appetite change, chills, fatigue, fever and unexpected weight change.  HENT:  Positive for congestion and rhinorrhea.   Eyes:  Negative for itching.  Respiratory:  Positive for cough, chest tightness, shortness of breath and wheezing.   Cardiovascular:  Negative for chest pain.  Gastrointestinal:  Negative for abdominal pain.  Genitourinary:  Negative for difficulty urinating.  Skin:  Negative for rash.  Allergic/Immunologic: Positive for environmental allergies. Negative for food allergies.   Objective: There were no vitals taken for this visit. There is no height or weight on file to calculate BMI. Physical Exam Vitals and nursing note reviewed.  Constitutional:      Appearance: Normal appearance. She is well-developed.  HENT:     Head: Normocephalic and atraumatic.     Right Ear: Tympanic membrane and external ear normal.     Left Ear: Tympanic membrane and external ear normal.     Nose: Nose normal.     Mouth/Throat:     Mouth: Mucous membranes are moist.     Pharynx: Oropharynx is clear.  Eyes:     Conjunctiva/sclera: Conjunctivae normal.  Cardiovascular:     Rate and Rhythm: Normal rate and regular rhythm.     Heart sounds: Normal heart sounds. No murmur heard. Pulmonary:     Effort: Pulmonary  effort is normal.     Breath sounds: Normal breath sounds. No wheezing, rhonchi or rales.  Musculoskeletal:     Cervical back: Neck supple.  Skin:    General: Skin is warm.     Findings: No rash.  Neurological:     Mental Status: She is alert and oriented to person, place, and time.  Psychiatric:        Behavior: Behavior normal.  Previous notes and tests were reviewed. The plan was reviewed with the patient/family, and all questions/concerned were addressed.  It was my pleasure to see Jamyia today and participate in her care. Please feel free to contact me with any questions or concerns.  Sincerely,  Rexene Alberts, DO Allergy & Immunology  [facility] office: [place] office: [phone]

## 2022-11-01 ENCOUNTER — Encounter: Payer: Self-pay | Admitting: Allergy

## 2022-11-01 ENCOUNTER — Ambulatory Visit (INDEPENDENT_AMBULATORY_CARE_PROVIDER_SITE_OTHER): Payer: BC Managed Care – PPO | Admitting: Allergy

## 2022-11-01 VITALS — BP 108/82 | HR 88 | Temp 98.7°F | Resp 18

## 2022-11-01 DIAGNOSIS — K219 Gastro-esophageal reflux disease without esophagitis: Secondary | ICD-10-CM

## 2022-11-01 DIAGNOSIS — J309 Allergic rhinitis, unspecified: Secondary | ICD-10-CM

## 2022-11-01 DIAGNOSIS — H1013 Acute atopic conjunctivitis, bilateral: Secondary | ICD-10-CM | POA: Diagnosis not present

## 2022-11-01 DIAGNOSIS — J3089 Other allergic rhinitis: Secondary | ICD-10-CM

## 2022-11-01 DIAGNOSIS — J018 Other acute sinusitis: Secondary | ICD-10-CM | POA: Diagnosis not present

## 2022-11-01 DIAGNOSIS — J4550 Severe persistent asthma, uncomplicated: Secondary | ICD-10-CM

## 2022-11-01 DIAGNOSIS — J302 Other seasonal allergic rhinitis: Secondary | ICD-10-CM

## 2022-11-01 DIAGNOSIS — Z889 Allergy status to unspecified drugs, medicaments and biological substances status: Secondary | ICD-10-CM

## 2022-11-01 DIAGNOSIS — L2089 Other atopic dermatitis: Secondary | ICD-10-CM

## 2022-11-01 MED ORDER — HYDROCOD POLI-CHLORPHE POLI ER 10-8 MG/5ML PO SUER: 5.0000 mL | Freq: Two times a day (BID) | ORAL | 0 refills | Status: DC | PRN

## 2022-11-01 MED ORDER — METHYLPREDNISOLONE ACETATE 80 MG/ML IJ SUSP: 80.0000 mg | Freq: Once | INTRAMUSCULAR | Status: DC

## 2022-11-01 MED ORDER — ALBUTEROL SULFATE HFA 108 (90 BASE) MCG/ACT IN AERS: 2.0000 | INHALATION_SPRAY | RESPIRATORY_TRACT | 1 refills | Status: DC | PRN

## 2022-11-01 MED ORDER — ALBUTEROL SULFATE (2.5 MG/3ML) 0.083% IN NEBU: 2.5000 mg | INHALATION_SOLUTION | RESPIRATORY_TRACT | 1 refills | Status: AC | PRN

## 2022-11-01 MED ORDER — AZITHROMYCIN 250 MG PO TABS: ORAL_TABLET | ORAL | 0 refills | Status: DC

## 2022-11-01 NOTE — Assessment & Plan Note (Signed)
Past history - Eucrisa not effective.  Continue proper skin care measures.  May use triamcinolone 0.1% ointment twice a day as needed for the hands. Do not use on the face, neck, armpits or groin area. Do not use more than 3 weeks in a row.

## 2022-11-01 NOTE — Assessment & Plan Note (Signed)
Continue lifestyle and dietary modifications. Continue famotidine '20mg'$  twice a day.

## 2022-11-01 NOTE — Assessment & Plan Note (Signed)
Past history - reactions to amoxicillin, cefuroxime, hydrocodone, levofloxacin, morphine, oxycodone, and propoxyphene in the past.  Continue to avoid all these medications.  Consider penicillin skin testing/drug challenge in the future. More than 90% of patients outgrow their penicillin allergy.  

## 2022-11-01 NOTE — Assessment & Plan Note (Signed)
Past history - Perennial rhinoconjunctivitis symptoms for the past 20 years with worsening in the spring and fall.  Allergy immunotherapy for 10 years with good benefit. 2019 skin testing showed, positive to dust mites, cat, dog, grass and horse. 2 cats at home. 2022 bloodwork positive to dust mites, cat, dog, grass pollen. Borderline to cockroach, mouse and maple/box elder tree pollen.  Continue environmental control measures - especially regarding the cats. Continue Singulair (montelukast) '10mg'$  daily.  Use over the counter antihistamines such as Zyrtec (cetirizine), Claritin (loratadine), Allegra (fexofenadine), or Xyzal (levocetirizine) daily as needed. May take twice a day during allergy flares. May switch antihistamines every few months. Continue Ryaltris (olopatadine + mometasone nasal spray combination) 1-2 sprays per nostril twice a day.  Nasal saline spray (i.e., Simply Saline) or nasal saline lavage (i.e., NeilMed) is recommended as needed and prior to medicated nasal sprays. May use refresh eye drops.  Need asthma to be more stable before starting AIT.

## 2022-11-01 NOTE — Patient Instructions (Addendum)
Sinus infection/bronchitis Steroid injection given today. Stop doxycycline. Start Zpak - 2 tablets on day 1, then 1 tablet days 2-5. See below for symptomatic management. May take Tussionex 58m twice a day as needed. If it makes you itchy, take benadryl and stop using.  Stop tessalon perles. Take mucinex twice a day with plenty of water to loosen up the phlegm.  Moderate persistent asthma Start Fasenra next Tuesday.  Daily controller medication(s):  Continue Breo 2048m 1 puff once a day and rinse mouth after each use.  Continue Spiriva 1.2576m2 puff once a day.  Continue Singulair '10mg'$  daily During upper respiratory infections/flares:  Start budesonide (Pulmicort) 0.'5mg'$  nebulizer for 1-2 weeks until your breathing symptoms return to baseline.  Pretreat with albuterol 2 puffs or albuterol nebulizer.  If you need to use your albuterol nebulizer machine back to back within 15-30 minutes with no relief then please go to the ER/urgent care for further evaluation.  May use albuterol rescue inhaler 2 puffs or nebulizer every 4 to 6 hours as needed for shortness of breath, chest tightness, coughing, and wheezing. May use albuterol rescue inhaler 2 puffs 5 to 15 minutes prior to strenuous physical activities. Monitor frequency of use.  Asthma control goals:  Full participation in all desired activities (may need albuterol before activity) Albuterol use two times or less a week on average (not counting use with activity) Cough interfering with sleep two times or less a month Oral steroids no more than once a year No hospitalizations    Seasonal and perennial allergic rhinoconjunctivitis 2022 bloodwork positive to dust mites, cat, dog, grass pollen. Borderline to cockroach, mouse and maple/box elder tree pollen.  Continue environmental control measures - especially regarding the cats. Continue Singulair (montelukast) '10mg'$  daily.  Use over the counter antihistamines such as Zyrtec (cetirizine),  Claritin (loratadine), Allegra (fexofenadine), or Xyzal (levocetirizine) daily as needed. May take twice a day during allergy flares. May switch antihistamines every few months. Continue Ryaltris (olopatadine + mometasone nasal spray combination) 1-2 sprays per nostril twice a day.  Nasal saline spray (i.e., Simply Saline) or nasal saline lavage (i.e., NeilMed) is recommended as needed and prior to medicated nasal sprays. May use refresh eye drops.    Other atopic dermatitis Continue proper skin care measures. May use triamcinolone 0.1% ointment twice a day as needed for the hands. Do not use on the face, neck, armpits or groin area. Do not use more than 3 weeks in a row.    Multiple drug allergies Continue to avoid medications on your allergy list.  Consider penicillin skin testing/drug challenge in the future. If interested we can schedule drug challenge to penicillin. You must be off antihistamines for 3-5 days before. Must be in good health and not ill. Plan on being in the office for 2-3 hours and must bring in the drug you want to do the oral challenge for - will send in prescription to pick up a few days before. You must call to schedule an appointment and specify it's for a drug challenge.  More than 90% of patients outgrow their penicillin allergy.    Heartburn Continue lifestyle and dietary modifications. Continue famotidine '20mg'$  twice a day.  Follow up in 3 months or sooner if needed.   Drink plenty of fluids. Water, juice, clear broth or warm lemon water are good choices. Avoid caffeine and alcohol, which can dehydrate you. Eat chicken soup. Chicken soup and other warm fluids can be soothing and loosen congestion. Rest. Adjust your  room's temperature and humidity. Keep your room warm but not overheated. If the air is dry, a cool-mist humidifier or vaporizer can moisten the air and help ease congestion and coughing. Keep the humidifier clean to prevent the growth of bacteria and  molds. Soothe your throat. Perform a saltwater gargle. Dissolve one-quarter to a half teaspoon of salt in a 4- to 8-ounce glass of warm water. This can relieve a sore or scratchy throat temporarily. Use saline nasal drops. To help relieve nasal congestion, try saline nasal drops. You can buy these drops over the counter, and they can help relieve symptoms ? even in children. Take over-the-counter cold and cough medications. For adults and children older than 5, over-the-counter decongestants, antihistamines and pain relievers might offer some symptom relief. However, they won't prevent a cold or shorten its duration.

## 2022-11-01 NOTE — Assessment & Plan Note (Signed)
Past history - Diagnosed with asthma over 19 years ago. Patient moved in with her parents who have cats and dogs at home during this time. Did not tolerate Trelegy, Breztri no as effective. Flares in the spring and fall.  Interim history - symptoms flaring. Today's spirometry showed some restriction.  Steroid shot given today. Start Fasenra next Tuesday - will keep in our fridge.  Daily controller medication(s):  Continue Breo 257mg 1 puff once a day and rinse mouth after each use.  Continue Spiriva 1.271m 2 puff once a day.  Continue Singulair '10mg'$  daily During upper respiratory infections/flares:  Start budesonide (Pulmicort) 0.'5mg'$  nebulizer for 1-2 weeks until your breathing symptoms return to baseline.  Pretreat with albuterol 2 puffs or albuterol nebulizer.  If you need to use your albuterol nebulizer machine back to back within 15-30 minutes with no relief then please go to the ER/urgent care for further evaluation.  May use albuterol rescue inhaler 2 puffs or nebulizer every 4 to 6 hours as needed for shortness of breath, chest tightness, coughing, and wheezing. May use albuterol rescue inhaler 2 puffs 5 to 15 minutes prior to strenuous physical activities. Monitor frequency of use.  Get spirometry at next visit.

## 2022-11-01 NOTE — Assessment & Plan Note (Signed)
Still having symptoms and finished prednisone and has 3 days of doxycycline left.  Steroid injection given today. Stop doxycycline. Start Zpak - 2 tablets on day 1, then 1 tablet days 2-5. See below for symptomatic management. May take Tussionex 23m twice a day as needed. If it makes you itchy, take benadryl and stop using.  Stop tessalon perles. Take mucinex twice a day with plenty of water to loosen up the phlegm.

## 2022-11-03 ENCOUNTER — Ambulatory Visit (HOSPITAL_BASED_OUTPATIENT_CLINIC_OR_DEPARTMENT_OTHER): Payer: BC Managed Care – PPO | Admitting: Physical Therapy

## 2022-11-03 ENCOUNTER — Encounter (HOSPITAL_BASED_OUTPATIENT_CLINIC_OR_DEPARTMENT_OTHER): Payer: Self-pay | Admitting: Physical Therapy

## 2022-11-03 DIAGNOSIS — M25672 Stiffness of left ankle, not elsewhere classified: Secondary | ICD-10-CM

## 2022-11-03 DIAGNOSIS — M25572 Pain in left ankle and joints of left foot: Secondary | ICD-10-CM | POA: Diagnosis not present

## 2022-11-03 DIAGNOSIS — R600 Localized edema: Secondary | ICD-10-CM | POA: Diagnosis not present

## 2022-11-03 DIAGNOSIS — R262 Difficulty in walking, not elsewhere classified: Secondary | ICD-10-CM

## 2022-11-03 NOTE — Therapy (Signed)
OUTPATIENT PHYSICAL THERAPY LOWER EXTREMITY TREATMENT   Patient Name: Erin Collins MRN: [id number] DOB:[birth year], 46 y.o., female Today's Date: 11/03/2022   PT End of Session - 11/03/22 1635     Visit Number 20    Number of Visits 25    Date for PT Re-Evaluation 11/18/22    Authorization Type BCBS    PT Start Time 1635    PT Stop Time 1708    PT Time Calculation (min) 33 min    Activity Tolerance Patient tolerated treatment well    Behavior During Therapy WFL for tasks assessed/performed                     Past Medical History:  Diagnosis Date   Anemia    Angio-edema    Asthma    Basal cell carcinoma ~ 1999   collarbone   Blood clot in vein ~ 2008   "left breast"    Complication of anesthesia    "I have trouble waking up afterwards" (12/18/2017)   Diarrhea 04/01/2019   Eczema    Family history of adverse reaction to anesthesia    "mom has trouble waking up afterwards" (12/18/2017)   Fluttering heart    "comes and goes" (12/18/2017)   GERD (gastroesophageal reflux disease)    Headache    "weekly" (12/18/2017)   History of kidney stones    Recurrent upper respiratory infection (URI)    Seizures ([place]) 1980 X 1   "bland stare"   Torticollis    Vertigo    Past Surgical History:  Procedure Laterality Date   ANKLE SURGERY Left 2023   [facility] [place] [facility] ~ 1999   "collarbone"   DILATION AND CURETTAGE OF UTERUS  2005   LAPAROSCOPIC ENDOMETRIOSIS FULGURATION  2005   NASAL SEPTOPLASTY W/ TURBINOPLASTY Bilateral 2014   [place]   TUBAL LIGATION Right 2005   TYMPANOSTOMY TUBE PLACEMENT     Patient Active Problem List   Diagnosis Date Noted   Not well controlled severe persistent asthma 10/25/2022   Gastroesophageal reflux disease 08/23/2022   Acute sinusitis/bronchitis 08/23/2022   Subacute cough 07/25/2022   Depression 07/25/2022   Urine discoloration 07/20/2022   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   Headache    Snoring    Insomnia 08/19/2021   Sinusitis 10/08/2020   Osteoporosis 02/04/2020   Seasonal and perennial allergic rhinoconjunctivitis 12/31/2018   Other atopic dermatitis 11/28/2018   Multiple drug allergies 11/28/2018   Healthcare maintenance 05/06/2018    PCP: Starlyn Skeans, MD  REFERRING PROVIDER: Criselda Peaches, DPM  REFERRING DIAG:  [phone] (ICD-10-CM) - Gastrocnemius equinus of left lower extremity  M92.62 (ICD-10-CM) - Haglund's deformity of left heel  M76.62 (ICD-10-CM) - Tendonitis, Achilles, left  Z98.890 (ICD-10-CM) - Status post surgery  Post op Achilles reconstruction and repair 04/29/22. Can be toe touch WB in boot until 06/27/22. Then can progress to full WB in boot with wedges. OK to come out of boot for exercises gradually   THERAPY DIAG:  Pain in left ankle and joints of left foot  Stiffness of left ankle, not elsewhere classified  Difficulty in walking, not elsewhere classified  Rationale for Evaluation and Treatment Rehabilitation  ONSET DATE: DOS 04/29/22  SUBJECTIVE:   SUBJECTIVE STATEMENT: It feels very tight, the shoe is tight on the back of my heel.    PERTINENT HISTORY: Chronic h/o foot issues  PAIN:  Are you having pain? Yes: NPRS scale: 2/10 Pain location: Lt ankle- distal attachment site Pain description: achy, tight Aggravating factors: prolonged standing walking Relieving factors: ice  PRECAUTIONS: Other: ankle WB  WEIGHT BEARING RESTRICTIONS  Can be toe touch WB in boot until 06/27/22. Then can progress to full WB in boot with wedges. OK to come out of boot for exercises gradually 07/22/22:  can be out of boot completely with gait.    FALLS:  Has patient fallen in last 6 months? Yes. 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: Independent  PATIENT GOALS hiking (was walking 3-5 mi/day), bike rides (did 100 mile bike rides)   OBJECTIVE:    PATIENT SURVEYS:  FOTO 20 9/18:  40 11/16:55      SENSATION: WFL  EDEMA:  Circumferential: Left 29 cm around [facility]; Right 26 cm 9/18  Lt: 28 cm    PALPATION: Minimal mobility through foot and ankle due to edema  LOWER EXTREMITY ROM:  Active/passive ROM Left eval LEFT 7/12 LEFT 7/18 LEFT 7/25 LEFT Left 8/4 Left 8/11 Left  8/29 Left 9/18 Left 10/12  Ankle dorsiflexion -32/-25 -24 -16 -17/-10 -6 -15 -15/-8 -4/0 2 2 / 5  Ankle plantarflexion        41    Ankle inversion        24    Ankle eversion        10     (Blank rows = not tested)  LOWER EXTREMITY MMT: not tested at eval   TODAY'S TREATMENT: Treatment                            11/03/22:  Aquatic: Seated on the edge of the pool performed ankle active range of motion and toe motions.  Standing gastroc stretch active from lunge position.  Seated plantar fascia stretch via heel raise.  Standing toe off to swing through portion of gait focusing on great toe extension and use of water for resistance to anterior tibialis and glutes activation upon stepping back. Gait training: In the hallway to and from pool, focus on glutes activation and great toe extension. Mcconnell tape to distal   Treatment                            10/27/22:  MANUAL: IASTM plantar fascia and gastroc in prone, paired with great toe extension passive stretch Seated heel raise for plantar fascia stretch Standing heel raises with chair for UE support/balance 4" step- lateral eccentric step down, forward/back step over, lateral step over to each side Squat to table tap- table at lowest point Lunges at sink   Treatment                            10/13/22:  MANUAL: STM Rt gluts/piriformis, bil lumbar paraspinals Prone hip ext with slight knee flexion Quadruped on forearms hip extension Bridge in dorsiflexion Plantar fascia stretch from qped Ankle DF mob in chair    PATIENT EDUCATION:  Education details: Geophysicist/field seismologist of condition, verbally reviewed HEP, exercise  form/rationale  Person educated: Patient Education method: Explanation, Demonstration, Tactile cues, Verbal cues, and Handouts Education comprehension: verbalized understanding, returned demonstration, verbal cues required, tactile cues required, and needs further education   HOME EXERCISE PROGRAM: [id number]- prefers digital  ASSESSMENT:  CLINICAL IMPRESSION: Patient arrived to therapy with significant edema and  ankle resulting in tight feeling and difficulty moving as well as antalgic gait pattern.  Chose to go back to the pool to provide compression to ankle during exercises which was very helpful today.  She was wearing shorts so she was able to stand on the bench for exercises.  Significant improvement in gait pattern following.  Did note popping of the distal peroneal tendons twice upon walking to the pool so McConnell tape was placed again for support as this was very successful last time.  OBJECTIVE IMPAIRMENTS Abnormal gait, decreased activity tolerance, decreased mobility, difficulty walking, decreased ROM, decreased strength, increased edema, increased muscle spasms, impaired sensation, improper body mechanics, postural dysfunction, and pain.   ACTIVITY LIMITATIONS carrying, standing, squatting, stairs, transfers, bathing, dressing, locomotion level, and caring for others  PARTICIPATION LIMITATIONS: meal prep, cleaning, laundry, driving, community activity, and occupation  PERSONAL FACTORS  asthma, osteoporosis  are also affecting patient's functional outcome.     GOALS: Goals reviewed with patient? Yes  SHORT TERM GOALS: Target date: 07/05/2022  Ankle DF to -10 or better Baseline: Goal status: achieved  2.  Demo proper gait pattern in alignment with WB precautions Baseline:  Goal status: achieved  3.  Resting pain <=7/10 Baseline:  Goal status: achieved   LONG TERM GOALS: Target date: 11/18/22  Pt will demo ability to perform at least 5 single leg heel raises with  UE support for balance Baseline:  Goal status: ongoing  2.  Pt will return to biking Baseline:  Goal status: ongoing  3.  Pt will return to hiking on trails with moderate difficulty Baseline:  Goal status: ongoing  4.  Pt to meet stated FOTO goal Baseline:  Goal status: ongoing  5.  Gross pain with ADLs <=3/10 Baseline:  Goal status:achieved with current level  6.  Demo normalized gait pattern Baseline:  Goal status: ongoing   PLAN: PT FREQUENCY: 1-2x/week  PT DURATION: 12 weeks  PLANNED INTERVENTIONS: Therapeutic exercises, Therapeutic activity, Neuromuscular re-education, Balance training, Gait training, Patient/Family education, Joint mobilization, Stair training, Aquatic Therapy, Dry Needling, Spinal mobilization, Cryotherapy, Moist heat, Taping, Vasopneumatic device, Manual therapy, and Re-evaluation  PLAN FOR NEXT SESSION:  focus on normalizing gait and improving ROM; progress balance to unstable surfaces  Erin Collins PT, DPT 11/03/22 5:13 PM

## 2022-11-07 ENCOUNTER — Other Ambulatory Visit: Payer: Self-pay | Admitting: Internal Medicine

## 2022-11-07 DIAGNOSIS — F320 Major depressive disorder, single episode, mild: Secondary | ICD-10-CM

## 2022-11-08 ENCOUNTER — Ambulatory Visit: Payer: BC Managed Care – PPO

## 2022-11-08 DIAGNOSIS — J4550 Severe persistent asthma, uncomplicated: Secondary | ICD-10-CM

## 2022-11-08 MED ORDER — BENRALIZUMAB 30 MG/ML ~~LOC~~ SOSY
30.0000 mg | PREFILLED_SYRINGE | SUBCUTANEOUS | Status: DC
  Administered 2022-11-08 – 2023-01-17 (×3): 30 mg via SUBCUTANEOUS

## 2022-11-09 ENCOUNTER — Ambulatory Visit (HOSPITAL_BASED_OUTPATIENT_CLINIC_OR_DEPARTMENT_OTHER): Payer: BC Managed Care – PPO | Admitting: Physical Therapy

## 2022-11-09 ENCOUNTER — Encounter (HOSPITAL_BASED_OUTPATIENT_CLINIC_OR_DEPARTMENT_OTHER): Payer: Self-pay | Admitting: Physical Therapy

## 2022-11-09 DIAGNOSIS — R262 Difficulty in walking, not elsewhere classified: Secondary | ICD-10-CM | POA: Diagnosis not present

## 2022-11-09 DIAGNOSIS — R600 Localized edema: Secondary | ICD-10-CM | POA: Diagnosis not present

## 2022-11-09 DIAGNOSIS — M25672 Stiffness of left ankle, not elsewhere classified: Secondary | ICD-10-CM

## 2022-11-09 DIAGNOSIS — M25572 Pain in left ankle and joints of left foot: Secondary | ICD-10-CM | POA: Diagnosis not present

## 2022-11-09 NOTE — Therapy (Signed)
OUTPATIENT PHYSICAL THERAPY LOWER EXTREMITY TREATMENT   Patient Name: Erin Collins MRN: [id number] DOB:1976-08-14, 46 y.o., female Today's Date: 11/09/2022   PT End of Session - 11/09/22 1653     Visit Number 21    Number of Visits 25    Date for PT Re-Evaluation 11/18/22    Authorization Type BCBS    PT Start Time 1653   arrives late   PT Stop Time 1720    PT Time Calculation (min) 27 min    Activity Tolerance Patient tolerated treatment well    Behavior During Therapy WFL for tasks assessed/performed                      Past Medical History:  Diagnosis Date   Anemia    Angio-edema    Asthma    Basal cell carcinoma ~ 1999   collarbone   Blood clot in vein ~ 2008   "left breast"    Complication of anesthesia    "I have trouble waking up afterwards" (12/18/2017)   Diarrhea 04/01/2019   Eczema    Family history of adverse reaction to anesthesia    "mom has trouble waking up afterwards" (12/18/2017)   Fluttering heart    "comes and goes" (12/18/2017)   GERD (gastroesophageal reflux disease)    Headache    "weekly" (12/18/2017)   History of kidney stones    Recurrent upper respiratory infection (URI)    Seizures ([place]) 1980 X 1   "bland stare"   Torticollis    Vertigo    Past Surgical History:  Procedure Laterality Date   ANKLE SURGERY Left 2023   [facility] [place] Right ~ 1999   "collarbone"   DILATION AND CURETTAGE OF UTERUS  2005   LAPAROSCOPIC ENDOMETRIOSIS FULGURATION  2005   NASAL SEPTOPLASTY W/ TURBINOPLASTY Bilateral 2014   [place]   TUBAL LIGATION Right 2005   TYMPANOSTOMY TUBE PLACEMENT     Patient Active Problem List   Diagnosis Date Noted   Not well controlled severe persistent asthma 10/25/2022   Gastroesophageal reflux disease 08/23/2022   Acute sinusitis/bronchitis 08/23/2022   Subacute cough 07/25/2022   Depression 07/25/2022   Urine discoloration  07/20/2022   HSV infection 04/28/2022   Headache    Snoring    Insomnia 08/19/2021   Sinusitis 10/08/2020   Osteoporosis 02/04/2020   Seasonal and perennial allergic rhinoconjunctivitis 12/31/2018   Other atopic dermatitis 11/28/2018   Multiple drug allergies 11/28/2018   Healthcare maintenance 05/06/2018    PCP: Starlyn Skeans, MD  REFERRING PROVIDER: Criselda Peaches, DPM  REFERRING DIAG:  [phone] (ICD-10-CM) - Gastrocnemius equinus of left lower extremity  M92.62 (ICD-10-CM) - Haglund's deformity of left heel  M76.62 (ICD-10-CM) - Tendonitis, Achilles, left  Z98.890 (ICD-10-CM) - Status post surgery  Post op Achilles reconstruction and repair 04/29/22. Can be toe touch WB in boot until 06/27/22. Then can progress to full WB in boot with wedges. OK to come out of boot for exercises gradually   THERAPY DIAG:  Pain in left ankle and joints of left foot  Stiffness of left ankle, not elsewhere classified  Difficulty in walking, not elsewhere classified  Localized edema  Rationale for Evaluation and Treatment Rehabilitation  ONSET DATE: DOS 04/29/22  SUBJECTIVE:   SUBJECTIVE STATEMENT: Pt states there is some minor swelling today. She states it is just sore into the med and lateral  of the ankle.   PERTINENT HISTORY: Chronic h/o foot issues  PAIN:  Are you having pain? Yes: NPRS scale: 2/10 Pain location: Lt ankle- distal attachment site Pain description: achy, tight Aggravating factors: prolonged standing walking Relieving factors: ice  PRECAUTIONS: Other: ankle WB  WEIGHT BEARING RESTRICTIONS none  FALLS:  Has patient fallen in last 6 months? Yes. 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: Independent  PATIENT GOALS hiking (was walking 3-5 mi/day), bike rides (did 100 mile bike rides)   OBJECTIVE:    PATIENT SURVEYS:  FOTO 20 9/18: 40 11/16:55      SENSATION: WFL  EDEMA:  Circumferential: Left 29 cm around [facility]; Right 26 cm 9/18  Lt:  28 cm    PALPATION: Minimal mobility through foot and ankle due to edema  LOWER EXTREMITY ROM:  Active/passive ROM Left eval LEFT 7/12 LEFT 7/18 LEFT 7/25 LEFT Left 8/4 Left 8/11 Left  8/29 Left 9/18 Left 10/12  Ankle dorsiflexion -32/-25 -24 -16 -17/-10 -6 -15 -15/-8 -4/0 2 2 / 5  Ankle plantarflexion        41    Ankle inversion        24    Ankle eversion        10     (Blank rows = not tested)  LOWER EXTREMITY MMT: not tested at eval   TODAY'S TREATMENT:  Treatment                            11/09/22:  STM left gastroc, posterior tibialis, fibularis group Lateral tendon gliding  SLS on Airex 30s 3x SLS with star reaching 5x Treatment                            11/03/22:  Aquatic: Seated on the edge of the pool performed ankle active range of motion and toe motions.  Standing gastroc stretch active from lunge position.  Seated plantar fascia stretch via heel raise.  Standing toe off to swing through portion of gait focusing on great toe extension and use of water for resistance to anterior tibialis and glutes activation upon stepping back. Gait training: In the hallway to and from pool, focus on glutes activation and great toe extension. Mcconnell tape to distal   Treatment                            10/27/22:  MANUAL: IASTM plantar fascia and gastroc in prone, paired with great toe extension passive stretch Seated heel raise for plantar fascia stretch Standing heel raises with chair for UE support/balance 4" step- lateral eccentric step down, forward/back step over, lateral step over to each side Squat to table tap- table at lowest point Lunges at sink   Treatment                            10/13/22:  MANUAL: STM Rt gluts/piriformis, bil lumbar paraspinals Prone hip ext with slight knee flexion Quadruped on forearms hip extension Bridge in dorsiflexion Plantar fascia stretch from qped Ankle DF mob in chair    PATIENT EDUCATION:  Education  details:anatomy, exercise progression, strength deficits, envelope of function, HEP, POC   Person educated: Patient Education method: Explanation, Demonstration, Tactile cues, Verbal cues, and Handouts Education comprehension: verbalized understanding, returned demonstration, verbal cues required,  tactile cues required, and needs further education   HOME EXERCISE PROGRAM: [id number]- prefers digital  ASSESSMENT:  CLINICAL IMPRESSION: Patient presents with increased left gastroc/soleus at today's session.  Patient with improvement in ankle dorsiflexion with terminal stance following soft tissue mobilization.  Upon exam, patientvstill unable to form single-leg calf raise at this time.  Patient advised to prioritize strengthening as well as ankle stability as patient is complaining of muscle soreness and nighttime cramping, likely due to overuse type irritation and strength/endurance deficits.  Patient is still fairly unstable and single-leg stance with shoes on and flat ground.  Patient exercise verbally updated today with single-leg balance type activity as patient was fairly weak into ankle inversion & eversion.  Patient will benefit from continued skilled therapy in order to address left lower extremity strength and range of motion deficits for return to prior level of function.  OBJECTIVE IMPAIRMENTS Abnormal gait, decreased activity tolerance, decreased mobility, difficulty walking, decreased ROM, decreased strength, increased edema, increased muscle spasms, impaired sensation, improper body mechanics, postural dysfunction, and pain.   ACTIVITY LIMITATIONS carrying, standing, squatting, stairs, transfers, bathing, dressing, locomotion level, and caring for others  PARTICIPATION LIMITATIONS: meal prep, cleaning, laundry, driving, community activity, and occupation  PERSONAL FACTORS  asthma, osteoporosis  are also affecting patient's functional outcome.     GOALS: Goals reviewed with  patient? Yes  SHORT TERM GOALS: Target date: 07/05/2022  Ankle DF to -10 or better Baseline: Goal status: achieved  2.  Demo proper gait pattern in alignment with WB precautions Baseline:  Goal status: achieved  3.  Resting pain <=7/10 Baseline:  Goal status: achieved   LONG TERM GOALS: Target date: 11/18/22  Pt will demo ability to perform at least 5 single leg heel raises with UE support for balance Baseline:  Goal status: ongoing  2.  Pt will return to biking Baseline:  Goal status: ongoing  3.  Pt will return to hiking on trails with moderate difficulty Baseline:  Goal status: ongoing  4.  Pt to meet stated FOTO goal Baseline:  Goal status: ongoing  5.  Gross pain with ADLs <=3/10 Baseline:  Goal status:achieved with current level  6.  Demo normalized gait pattern Baseline:  Goal status: ongoing   PLAN: PT FREQUENCY: 1-2x/week  PT DURATION: 12 weeks  PLANNED INTERVENTIONS: Therapeutic exercises, Therapeutic activity, Neuromuscular re-education, Balance training, Gait training, Patient/Family education, Joint mobilization, Stair training, Aquatic Therapy, Dry Needling, Spinal mobilization, Cryotherapy, Moist heat, Taping, Vasopneumatic device, Manual therapy, and Re-evaluation  PLAN FOR NEXT SESSION:  focus on normalizing gait and improving ROM; progress balance to unstable surfaces, L ankle strength   Daleen Bo PT, DPT 11/09/22 5:40 PM

## 2022-11-17 ENCOUNTER — Encounter (HOSPITAL_BASED_OUTPATIENT_CLINIC_OR_DEPARTMENT_OTHER): Payer: BC Managed Care – PPO | Admitting: Physical Therapy

## 2022-12-06 ENCOUNTER — Ambulatory Visit: Payer: BC Managed Care – PPO

## 2022-12-06 DIAGNOSIS — J4550 Severe persistent asthma, uncomplicated: Secondary | ICD-10-CM | POA: Diagnosis not present

## 2022-12-08 ENCOUNTER — Encounter (HOSPITAL_BASED_OUTPATIENT_CLINIC_OR_DEPARTMENT_OTHER): Payer: Self-pay | Admitting: Physical Therapy

## 2022-12-08 ENCOUNTER — Ambulatory Visit (HOSPITAL_BASED_OUTPATIENT_CLINIC_OR_DEPARTMENT_OTHER): Payer: BC Managed Care – PPO | Attending: Podiatry | Admitting: Physical Therapy

## 2022-12-08 DIAGNOSIS — R262 Difficulty in walking, not elsewhere classified: Secondary | ICD-10-CM | POA: Insufficient documentation

## 2022-12-08 DIAGNOSIS — M25672 Stiffness of left ankle, not elsewhere classified: Secondary | ICD-10-CM | POA: Insufficient documentation

## 2022-12-08 DIAGNOSIS — R600 Localized edema: Secondary | ICD-10-CM | POA: Diagnosis not present

## 2022-12-08 DIAGNOSIS — M25572 Pain in left ankle and joints of left foot: Secondary | ICD-10-CM | POA: Insufficient documentation

## 2022-12-08 NOTE — Therapy (Signed)
OUTPATIENT PHYSICAL THERAPY LOWER EXTREMITY TREATMENT   Patient Name: Erin Collins MRN: [id number] DOB:Aug 08, 1976, 46 y.o., female Today's Date: 12/08/2022   PT End of Session - 12/08/22 1654     Visit Number 22    Number of Visits 25    Date for PT Re-Evaluation 12/08/22    Authorization Type BCBS    PT Start Time 1653    PT Stop Time 7169    PT Time Calculation (min) 37 min    Activity Tolerance Patient tolerated treatment well    Behavior During Therapy WFL for tasks assessed/performed                      Past Medical History:  Diagnosis Date   Anemia    Angio-edema    Asthma    Basal cell carcinoma ~ 1999   collarbone   Blood clot in vein ~ 2008   "left breast"    Complication of anesthesia    "I have trouble waking up afterwards" (12/18/2017)   Diarrhea 04/01/2019   Eczema    Family history of adverse reaction to anesthesia    "mom has trouble waking up afterwards" (12/18/2017)   Fluttering heart    "comes and goes" (12/18/2017)   GERD (gastroesophageal reflux disease)    Headache    "weekly" (12/18/2017)   History of kidney stones    Recurrent upper respiratory infection (URI)    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   "bland stare"   Torticollis    Vertigo    Past Surgical History:  Procedure Laterality Date   ANKLE SURGERY Left 2023   [facility] Right ~ 1999   "collarbone"   DILATION AND CURETTAGE OF UTERUS  2005   LAPAROSCOPIC ENDOMETRIOSIS FULGURATION  2005   NASAL SEPTOPLASTY W/ TURBINOPLASTY Bilateral 2014   [place]   TUBAL LIGATION Right 2005   TYMPANOSTOMY TUBE PLACEMENT     Patient Active Problem List   Diagnosis Date Noted   Not well controlled severe persistent asthma 10/25/2022   Gastroesophageal reflux disease 08/23/2022   Acute sinusitis/bronchitis 08/23/2022   Subacute cough 07/25/2022   Depression 07/25/2022   Urine discoloration 07/20/2022   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   Headache    Snoring    Insomnia 08/19/2021   Sinusitis 10/08/2020   Osteoporosis 02/04/2020   Seasonal and perennial allergic rhinoconjunctivitis 12/31/2018   Other atopic dermatitis 11/28/2018   Multiple drug allergies 11/28/2018   Healthcare maintenance 05/06/2018    PCP: Starlyn Skeans, MD  REFERRING PROVIDER: Criselda Peaches, DPM  REFERRING DIAG:  [phone] (ICD-10-CM) - Gastrocnemius equinus of left lower extremity  M92.62 (ICD-10-CM) - Haglund's deformity of left heel  M76.62 (ICD-10-CM) - Tendonitis, Achilles, left  Z98.890 (ICD-10-CM) - Status post surgery  Post op Achilles reconstruction and repair 04/29/22. Can be toe touch WB in boot until 06/27/22. Then can progress to full WB in boot with wedges. OK to come out of boot for exercises gradually   THERAPY DIAG:  Pain in left ankle and joints of left foot  Stiffness of left ankle, not elsewhere classified  Difficulty in walking, not elsewhere classified  Localized edema  Rationale for Evaluation and Treatment Rehabilitation  ONSET DATE: DOS 04/29/22  SUBJECTIVE:   SUBJECTIVE STATEMENT: Walked 9 miles at [employer] and was just sore the next day. Need to hold on to gain ROM in descending  stairs.    PERTINENT HISTORY: Chronic h/o foot issues  PAIN:  Are you having pain? Yes: NPRS scale: 0/10 Pain location: Lt ankle- distal attachment site Pain description: achy, tight Aggravating factors: prolonged standing walking Relieving factors: ice  PRECAUTIONS: Other: ankle WB  WEIGHT BEARING RESTRICTIONS none  FALLS:  Has patient fallen in last 6 months? Yes. 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: Independent  PATIENT GOALS hiking (was walking 3-5 mi/day), bike rides (did 100 mile bike rides)   OBJECTIVE:    PATIENT SURVEYS:  FOTO 20 9/18: 40 11/16:55 12/21: 67      SENSATION: WFL  EDEMA:  Circumferential: Left 29 cm around [facility]; Right 26 cm 9/18  Lt: 28  cm    PALPATION: Good mobility noted through ankle and midfoot  LOWER EXTREMITY ROM:  Active/passive ROM Left eval LEFT 7/12 LEFT 7/18 LEFT 7/25 LEFT Left 8/4 Left 8/11 Left  8/29 Left 9/18 Left 10/12 LEFT 12/21  Ankle dorsiflexion -32/-25 -24 -16 -17/-10 -6 -15 -15/-8 -4/0 2 2 / 5 5/5  Ankle plantarflexion        41     Ankle inversion        24     Ankle eversion        10      (Blank rows = not tested)    TODAY'S TREATMENT:  Treatment                            12/08/22:  Single leg heel raise & double leg raise with single lower Review of HEP exercises & progressions    Treatment                            11/09/22:  STM left gastroc, posterior tibialis, fibularis group Lateral tendon gliding  SLS on Airex 30s 3x SLS with star reaching 5x Treatment                            11/03/22:  Aquatic: Seated on the edge of the pool performed ankle active range of motion and toe motions.  Standing gastroc stretch active from lunge position.  Seated plantar fascia stretch via heel raise.  Standing toe off to swing through portion of gait focusing on great toe extension and use of water for resistance to anterior tibialis and glutes activation upon stepping back. Gait training: In the hallway to and from pool, focus on glutes activation and great toe extension. Mcconnell tape to distal   Treatment                            10/27/22:  MANUAL: IASTM plantar fascia and gastroc in prone, paired with great toe extension passive stretch Seated heel raise for plantar fascia stretch Standing heel raises with chair for UE support/balance 4" step- lateral eccentric step down, forward/back step over, lateral step over to each side Squat to table tap- table at lowest point Lunges at sink   Treatment                            10/13/22:  MANUAL: STM Rt gluts/piriformis, bil lumbar paraspinals Prone hip ext with slight knee flexion Quadruped on forearms hip  extension Bridge in dorsiflexion Plantar fascia stretch  from qped Ankle DF mob in chair    PATIENT EDUCATION:  Education details:anatomy, exercise progression, strength deficits, envelope of function, HEP, POC   Person educated: Patient Education method: Explanation, Demonstration, Tactile cues, Verbal cues, and Handouts Education comprehension: verbalized understanding, returned demonstration, verbal cues required, tactile cues required, and needs further education   HOME EXERCISE PROGRAM: [id number] prefers digital  ASSESSMENT:  CLINICAL IMPRESSION: Pt has made significant progress since beginning PT and is prepared for d/c to long-term strengthening program. Has scheduled fitness evaluation with [facility] and was encouraged to contact me with any further questions.   OBJECTIVE IMPAIRMENTS Abnormal gait, decreased activity tolerance, decreased mobility, difficulty walking, decreased ROM, decreased strength, increased edema, increased muscle spasms, impaired sensation, improper body mechanics, postural dysfunction, and pain.   ACTIVITY LIMITATIONS carrying, standing, squatting, stairs, transfers, bathing, dressing, locomotion level, and caring for others  PARTICIPATION LIMITATIONS: meal prep, cleaning, laundry, driving, community activity, and occupation  PERSONAL FACTORS  asthma, osteoporosis  are also affecting patient's functional outcome.     GOALS: Goals reviewed with patient? Yes  SHORT TERM GOALS: Target date: 07/05/2022  Ankle DF to -10 or better Baseline: Goal status: achieved  2.  Demo proper gait pattern in alignment with WB precautions Baseline:  Goal status: achieved  3.  Resting pain <=7/10 Baseline:  Goal status: achieved   LONG TERM GOALS: Target date: 11/18/22  Pt will demo ability to perform at least 5 single leg heel raises with UE support for balance Baseline:  Goal status: achieved  2.  Pt will return to biking Baseline:  Goal status:  achieved  3.  Pt will return to hiking on trails with moderate difficulty Baseline:  Goal status: have not tried  4.  Pt to meet stated FOTO goal Baseline:  Goal status: achieved  5.  Gross pain with ADLs <=3/10 Baseline:  Goal status:achieved  6.  Demo normalized gait pattern Baseline:  Goal status: achieved   PLAN: PT FREQUENCY: 1-2x/week  PT DURATION: 12 weeks  PLANNED INTERVENTIONS: Therapeutic exercises, Therapeutic activity, Neuromuscular re-education, Balance training, Gait training, Patient/Family education, Joint mobilization, Stair training, Aquatic Therapy, Dry Needling, Spinal mobilization, Cryotherapy, Moist heat, Taping, Vasopneumatic device, Manual therapy, and Re-evaluation    Tekelia Kareem C. Curley Fayette PT, DPT 12/08/22 8:29 PM

## 2022-12-15 ENCOUNTER — Encounter (HOSPITAL_BASED_OUTPATIENT_CLINIC_OR_DEPARTMENT_OTHER): Payer: BC Managed Care – PPO | Admitting: Physical Therapy

## 2022-12-16 ENCOUNTER — Encounter (HOSPITAL_BASED_OUTPATIENT_CLINIC_OR_DEPARTMENT_OTHER): Payer: Self-pay | Admitting: Pediatrics

## 2022-12-16 ENCOUNTER — Emergency Department (HOSPITAL_BASED_OUTPATIENT_CLINIC_OR_DEPARTMENT_OTHER)
Admission: EM | Admit: 2022-12-16 | Discharge: 2022-12-16 | Disposition: A | Discharge status: Home / Self Care | Payer: BC Managed Care – PPO | Attending: Emergency Medicine | Admitting: Emergency Medicine

## 2022-12-16 ENCOUNTER — Other Ambulatory Visit: Payer: Self-pay

## 2022-12-16 DIAGNOSIS — Z7951 Long term (current) use of inhaled steroids: Secondary | ICD-10-CM | POA: Diagnosis not present

## 2022-12-16 DIAGNOSIS — T148XXA Other injury of unspecified body region, initial encounter: Secondary | ICD-10-CM

## 2022-12-16 DIAGNOSIS — M25552 Pain in left hip: Secondary | ICD-10-CM | POA: Diagnosis not present

## 2022-12-16 DIAGNOSIS — J45909 Unspecified asthma, uncomplicated: Secondary | ICD-10-CM | POA: Diagnosis not present

## 2022-12-16 DIAGNOSIS — S76012A Strain of muscle, fascia and tendon of left hip, initial encounter: Secondary | ICD-10-CM | POA: Diagnosis not present

## 2022-12-16 MED ORDER — CYCLOBENZAPRINE HCL 10 MG PO TABS
10.0000 mg | ORAL_TABLET | Freq: Once | ORAL | Status: AC
  Administered 2022-12-16: 10 mg via ORAL
  Filled 2022-12-16: qty 1

## 2022-12-16 MED ORDER — LIDOCAINE 5 % EX PTCH
1.0000 | MEDICATED_PATCH | Freq: Once | CUTANEOUS | Status: DC
  Administered 2022-12-16: 1 via TRANSDERMAL
  Filled 2022-12-16: qty 1

## 2022-12-16 MED ORDER — ACETAMINOPHEN 500 MG PO TABS
1000.0000 mg | ORAL_TABLET | Freq: Once | ORAL | Status: AC
  Administered 2022-12-16: 1000 mg via ORAL
  Filled 2022-12-16: qty 2

## 2022-12-16 MED ORDER — CYCLOBENZAPRINE HCL 10 MG PO TABS: 10.0000 mg | ORAL_TABLET | Freq: Two times a day (BID) | ORAL | 0 refills | Status: DC | PRN

## 2022-12-16 MED ORDER — IBUPROFEN 400 MG PO TABS
600.0000 mg | ORAL_TABLET | Freq: Once | ORAL | Status: AC
  Administered 2022-12-16: 600 mg via ORAL
  Filled 2022-12-16: qty 1

## 2022-12-16 NOTE — ED Provider Notes (Signed)
[facility] EMERGENCY DEPARTMENT Provider Note   CSN: [id number] Arrival date & time: 12/16/22  1005     History  Chief Complaint  Patient presents with   Hip Pain    Erin Collins is a 46 y.o. female.  Patient is a 46 year old female with a past medical history of asthma presenting to the emergency department with left-sided hip pain.  States that she has had pain in her left hip over the last couple of days and the pain is getting so severe today she is having difficulty getting out of bed.  She states that it radiates from her left lower back through her left hip.  She states she has had sciatica in the past but this feels different from her sciatica.  She denies any trauma or falls, numbness or weakness, saddle anesthesia, loss of bowel or bladder function.  She states she has not taken anything for pain at home.  The history is provided by the patient.  Hip Pain       Home Medications Prior to Admission medications   Medication Sig Start Date End Date Taking? Authorizing Provider  cyclobenzaprine (FLEXERIL) 10 MG tablet Take 1 tablet (10 mg total) by mouth 2 (two) times daily as needed for muscle spasms. 12/16/22  Yes Maylon Peppers, Jordan Hawks K, DO  albuterol (PROVENTIL) (2.5 MG/3ML) 0.083% nebulizer solution Take 3 mLs (2.5 mg total) by nebulization every 4 (four) hours as needed for wheezing or shortness of breath (coughing fits). 11/01/22   Garnet Sierras, DO  albuterol (VENTOLIN HFA) 108 (90 Base) MCG/ACT inhaler Inhale 2 puffs into the lungs every 4 (four) hours as needed for wheezing or shortness of breath (coughing fits). 11/01/22   Garnet Sierras, DO  azithromycin (ZITHROMAX Z-PAK) 250 MG tablet Take 2 tablets on day 1, 1 tablet on days 2-5. 11/01/22   Garnet Sierras, DO  benzonatate (TESSALON PERLES) 100 MG capsule Take 1 capsule (100 mg total) by mouth 3 (three) times daily as needed for cough. 10/25/22   Masters, Katie, DO  budesonide (PULMICORT) 0.5 MG/2ML nebulizer  solution Take 2 mLs (0.5 mg total) by nebulization in the morning and at bedtime. 08/23/22   Garnet Sierras, DO  chlorpheniramine-HYDROcodone (TUSSIONEX) 10-8 MG/5ML Take 5 mLs by mouth 2 (two) times daily as needed for cough. 11/01/22   Garnet Sierras, DO  famotidine (PEPCID) 20 MG tablet Take 1 tablet (20 mg total) by mouth 2 (two) times daily. 08/23/22   Garnet Sierras, DO  FASENRA PEN 30 MG/ML SOAJ Inject into the skin. Patient not taking: Reported on 11/01/2022 10/25/22   [provider]  fluticasone furoate-vilanterol (BREO ELLIPTA) 200-25 MCG/ACT AEPB Inhale 1 puff into the lungs daily. Rinse mouth after each use. Patient not taking: Reported on 11/01/2022 08/23/22   Garnet Sierras, DO  montelukast (SINGULAIR) 10 MG tablet Take 1 tablet (10 mg total) by mouth at bedtime. 12/20/21   Garnet Sierras, DO  Olopatadine-Mometasone (RYALTRIS) [phone] MCG/ACT SUSP Place 1-2 sprays into the nose in the morning and at bedtime. 08/23/22   Garnet Sierras, DO  sertraline (ZOLOFT) 25 MG tablet TAKE 1 TABLET (25 MG TOTAL) BY MOUTH DAILY. 11/08/22 02/06/23  Dorethea Clan, DO  Spacer/Aero-Holding Chambers (EQ SPACE CHAMBER ANTI-STATIC) DEVI Inhale into the lungs as directed. 04/16/21   [provider]  Tiotropium Bromide Monohydrate (SPIRIVA RESPIMAT) 1.25 MCG/ACT AERS Inhale 2 puffs into the lungs daily. 08/23/22   Garnet Sierras, DO  Allergies    Amoxicillin, Cefuroxime axetil, Hydrocodone, Levofloxacin, Morphine, Oxycodone-acetaminophen, Penicillins, and Propoxyphene    Review of Systems   Review of Systems  Physical Exam Updated Vital Signs BP (!) 126/98 (BP Location: Left Arm)   Pulse 91   Temp 98 F (36.7 C) (Oral)   Resp 18   Ht '5\' 4"'$  (1.626 m)   Wt 89.8 kg   SpO2 98%   BMI 33.99 kg/m  Physical Exam Vitals and nursing note reviewed.  Constitutional:      General: She is not in acute distress.    Appearance: Normal appearance.  HENT:     Head: Normocephalic and atraumatic.     Nose: Nose normal.      Mouth/Throat:     Mouth: Mucous membranes are moist.  Eyes:     Extraocular Movements: Extraocular movements intact.     Conjunctiva/sclera: Conjunctivae normal.  Neck:     Comments: No midline neck tenderness Cardiovascular:     Rate and Rhythm: Normal rate.     Pulses: Normal pulses.  Pulmonary:     Effort: Pulmonary effort is normal.  Abdominal:     General: Abdomen is flat.     Tenderness: There is no abdominal tenderness.  Musculoskeletal:        General: No swelling or deformity. Normal range of motion.     Cervical back: Normal range of motion and neck supple.     Comments: No midline back tenderness Mild left-sided buttock tenderness to palpation No pain with bilateral hip internal/external rotation Negative straight leg raise bilaterally  Skin:    General: Skin is warm and dry.  Neurological:     General: No focal deficit present.     Mental Status: She is alert and oriented to person, place, and time.     Sensory: No sensory deficit.     Motor: No weakness.  Psychiatric:        Mood and Affect: Mood normal.        Behavior: Behavior normal.     ED Results / Procedures / Treatments   Labs (all labs ordered are listed, but only abnormal results are displayed) Labs Reviewed - No data to display  EKG None  Radiology No results found.  Procedures Procedures    Medications Ordered in ED Medications  lidocaine (LIDODERM) 5 % 1-3 patch (1 patch Transdermal Patch Applied 12/16/22 1054)  acetaminophen (TYLENOL) tablet 1,000 mg (1,000 mg Oral Given 12/16/22 1052)  ibuprofen (ADVIL) tablet 600 mg (600 mg Oral Given 12/16/22 1052)  cyclobenzaprine (FLEXERIL) tablet 10 mg (10 mg Oral Given 12/16/22 1222)    ED Course/ Medical Decision Making/ A&P Clinical Course as of 12/16/22 1334  Fri Dec 16, 2022  1332 Tonight, the patient's pain is significantly improved.  She is stable for discharge home with primary care follow-up and was given strict return  precautions. [VK]    Clinical Course User Index [VK] Kemper Durie, DO                           Medical Decision Making This patient presents to the ED with chief complaint(s) of L hip pain with pertinent past medical history of asthma which further complicates the presenting complaint. The complaint involves an extensive differential diagnosis and also carries with it a high risk of complications and morbidity.    The differential diagnosis includes patient has had no trauma or falls making fracture dislocation unlikely, she  has no midline back tenderness no saddle anesthesia no loss of bowel or bladder function and no focal neurologic deficits making cauda equina or epidural abscess unlikely, considering muscle strain or spasm  Additional history obtained: Additional history obtained from N/A Records reviewed N/A  ED Course and Reassessment: Upon patient's arrival she is awake alert and comfortable appearing in no acute distress.  She was initially trialed on Tylenol, Motrin and lidocaine patch for pain control.  She has no focal deficits on her neurologic exam and no bony tenderness.  She initially only had mild improvement with Tylenol and Motrin and will be given additional Flexeril she states she does have a safe ride home.  Independent labs interpretation:  N/A  Independent visualization of imaging: N/A  Consultation: - Consulted or discussed management/test interpretation w/ external professional: N/A  Consideration for admission or further workup: Patient has no emergent conditions requiring admission or further work-up at this time and is stable for discharge home with primary care follow-up  Social Determinants of health: N/A    Risk OTC drugs. Prescription drug management.          Final Clinical Impression(s) / ED Diagnoses Final diagnoses:  Left hip pain  Muscle strain    Rx / DC Orders ED Discharge Orders          Ordered     cyclobenzaprine (FLEXERIL) 10 MG tablet  2 times daily PRN        12/16/22 1334              Kemper Durie, DO 12/16/22 1334

## 2022-12-16 NOTE — Discharge Instructions (Signed)
Seen in the emergency department for your hip pain.  He had no signs of broken bones or injuries to your nerves.  You likely strained or pulled a muscle in your hip.  You can continue to take Tylenol and Motrin as needed for pain as well as use ice or heat.  I have given you some muscle relaxers that you can take as needed for breakthrough pain release can make you drowsy so I recommend taking before bed and do not take if you are going to be driving, operating heavy machinery, working or watching small children alone.  You should follow-up with your primary doctor in the next few days to have your symptoms rechecked.  You should return to the emergency department if your pain gets significantly worse and you are unable to walk, you have numbness or weakness in your leg, you are unable to control your bowels or your bladder or if you have any other new or concerning symptoms.

## 2022-12-16 NOTE — ED Triage Notes (Signed)
Reports hip soreness on left side hip and now has pain on lower back radiating sharp pains down to the upper thigh and groin; denies recent injury.

## 2022-12-21 ENCOUNTER — Other Ambulatory Visit: Payer: Self-pay | Admitting: Allergy

## 2022-12-22 ENCOUNTER — Encounter (HOSPITAL_BASED_OUTPATIENT_CLINIC_OR_DEPARTMENT_OTHER): Payer: BC Managed Care – PPO | Admitting: Physical Therapy

## 2022-12-29 ENCOUNTER — Encounter (HOSPITAL_BASED_OUTPATIENT_CLINIC_OR_DEPARTMENT_OTHER): Payer: BC Managed Care – PPO | Admitting: Physical Therapy

## 2023-01-03 ENCOUNTER — Ambulatory Visit: Payer: BC Managed Care – PPO

## 2023-01-05 ENCOUNTER — Encounter (HOSPITAL_BASED_OUTPATIENT_CLINIC_OR_DEPARTMENT_OTHER): Payer: BC Managed Care – PPO | Admitting: Physical Therapy

## 2023-01-15 ENCOUNTER — Other Ambulatory Visit: Payer: Self-pay | Admitting: Allergy

## 2023-01-17 ENCOUNTER — Ambulatory Visit: Payer: BC Managed Care – PPO

## 2023-01-17 DIAGNOSIS — J4550 Severe persistent asthma, uncomplicated: Secondary | ICD-10-CM | POA: Diagnosis not present

## 2023-01-25 ENCOUNTER — Ambulatory Visit: Payer: BC Managed Care – PPO

## 2023-01-25 VITALS — BP 123/86 | HR 89 | Ht 64.0 in | Wt 202.4 lb

## 2023-01-25 DIAGNOSIS — F32A Depression, unspecified: Secondary | ICD-10-CM

## 2023-01-25 DIAGNOSIS — R1011 Right upper quadrant pain: Secondary | ICD-10-CM

## 2023-01-25 DIAGNOSIS — R1031 Right lower quadrant pain: Secondary | ICD-10-CM

## 2023-01-25 DIAGNOSIS — R109 Unspecified abdominal pain: Secondary | ICD-10-CM | POA: Insufficient documentation

## 2023-01-25 MED ORDER — CITALOPRAM HYDROBROMIDE 20 MG PO TABS: 20.0000 mg | ORAL_TABLET | Freq: Every day | ORAL | 2 refills | Status: DC

## 2023-01-25 NOTE — Patient Instructions (Addendum)
Thank you for coming to see [place] in clinic Erin Collins.  Plan: - We drew your blood today to check your electrolytes, kidney function, liver function, pancreas function, and gallbladder function (we will call you with these results)   - We got your urine today to check for signs of infection (we will call you with these results)   - We ordered a right upper quadrant ultrasound to investigate your abdominal pain (you will be called to schedule an appointment)  - Please stop taking sertraline 25 mg daily  - Please start taking Citalopram 20 mg daily for your depression  - We referred you to a psychiatrist (you will be called to schedule an appointment)     It was very nice to see you, thank you for allowing [place] to be involved in your care.   Please arrive to your next appointment 5-10 minutes before your scheduled appointment time, thank you!

## 2023-01-25 NOTE — Assessment & Plan Note (Addendum)
Patient developed intermittent right upper quadrant pain that began in 10/2022. Patient states that the pain does not radiate and is 5/10 in severity when present. Patient describes the pain as a pressure. Patient states that the pain worsens w/ eating. Pain does not worsen with deep breathing. Patient denies recent injury or increase in activity. Patient denies any fever, chills, vomiting, diarrhea, changes to the color, consistency, frequency of her stool. Denies hematochezia. Patient states that they have not tried any medications at home for her pain. Afebrile today in clinic. Suspect cholelithiasis. Differential also includes pancreatitis. Positive Murphy sign on exam however given lack of fever, low suspicion for cholecystitis at this time. Will order RUQ [place] to assess for signs of cholelithiasis. Will order CMP to assess renal function, liver function, and gallbladder function. Will order lipase to r/o pancreatitis.   Plan: - Ordered RUQ [place]  - CMP today - Lipase today - OTC tylenol for now

## 2023-01-25 NOTE — Assessment & Plan Note (Signed)
Patient has a history of depression. Current medications include sertraline 25 mg daily. This medication was started in 07/2022. Patient reports that they were compliant with this medication but have not taken in it in a month. They report symptom improvement after starting this medication but experienced blunted affect. Patient has not f/u w/ a psychiatrist and therapist previously but would like to see one. Patient reports decreased sleep, decreased energy, decreased activity, increased feelings of guilt, and difficulty concentrating. Patient denies decreased interest in hobbies or visual/auditory hallucinations. Patient denies SI or HI. PHQ9 today 15.   Plan: - Discontinue sertraline 25 mg daily - Start Citalopram 20 mg daily - Referral to psychiatry ordered

## 2023-01-25 NOTE — Assessment & Plan Note (Addendum)
>>  ASSESSMENT AND PLAN FOR ABDOMINAL PAIN OF UNKNOWN CAUSE WRITTEN ON 01/25/2023 11:34 AM BY Kazuki Ingle, Gaylyn Cheers, MD  Patient developed intermittent right lower abdominal pain 5 days ago that began while relaxing. Patient states that the pain does radiate to her right flank and is 9/10 in severity when present. Patient denies any transforming factors. Patient denies recent injury or increase in activity. Patient endorses intermittent nausea over the last few months. Patient denies fever, chills, vomiting, diarrhea, urinary frequency, dysuria, or vaginal discharge. Patient states that they have not tried any medications at home for her pain. Patient does have a history of kidney stones but is unsure if this feels similar or not. Differential diagnosis includes cystitis, pyelonephritis, nephrolithiasis, musculoskeletal, and neuropathic. Physical exam is positive for CVA tenderness on the right but negative for RLQ tenderness. Musculoskeletal and neuropathic seem less likely given no recent increase in activity, trauma, surgery, or history of diabetes. Afebrile today, suspect that pyelonephritis is less likely. Would like to r/o cystitis and nephrolithiasis.   Plan: - UA today - CMP today - Will consider ordering CT if abnormal UA (to assess for structural abnormalities vs nephrolithiasis) - OTC tylenol for now   >>ASSESSMENT AND PLAN FOR ABDOMINAL PAIN, RIGHT UPPER QUADRANT WRITTEN ON 01/25/2023 11:33 AM BY Lambert Jeanty, Gaylyn Cheers, MD  Patient developed intermittent right upper quadrant pain that began in 10/2022. Patient states that the pain does not radiate and is 5/10 in severity when present. Patient describes the pain as a pressure. Patient states that the pain worsens w/ eating. Pain does not worsen with deep breathing. Patient denies recent injury or increase in activity. Patient denies any fever, chills, vomiting, diarrhea, changes to the color, consistency, frequency of her stool. Denies hematochezia. Patient states  that they have not tried any medications at home for her pain. Afebrile today in clinic. Suspect cholelithiasis. Differential also includes pancreatitis. Positive Murphy sign on exam however given lack of fever, low suspicion for cholecystitis at this time. Will order RUQ [place] to assess for signs of cholelithiasis. Will order CMP to assess renal function, liver function, and gallbladder function. Will order lipase to r/o pancreatitis.   Plan: - Ordered RUQ [place]  - CMP today - Lipase today - OTC tylenol for now

## 2023-01-25 NOTE — Progress Notes (Signed)
CC: Abdominal pain  HPI:  [name] is a 47 y.o. female with past medical history of GERD, asthma, atopic dermatitis, sciatica, insomnia, and depression that presents for abdominal pain.   Patient developed intermittent right lower abdominal pain 5 days ago that began while relaxing. Patient states that the pain does radiate to her right flank and is 9/10 in severity when present. Patient denies any transforming factors. Patient denies recent injury or increase in activity. Patient endorses intermittent nausea over the last few months. Patient denies fever, chills, vomiting, diarrhea, urinary frequency, dysuria, or vaginal discharge. Patient states that they have not tried any medications at home for her pain. Patient does have a history of kidney stones but is unsure if this feels similar or not.   Patient also developed intermittent right upper quadrant pain that began in 10/2022. Patient states that the pain does not radiate and is 5/10 in severity when present. Patient describes the pain as a pressure. Patient states that the pain worsens w/ eating. Pain does not worsen with deep breathing. Patient denies recent injury or increase in activity. Patient denies any fever, chills, vomiting, diarrhea, changes to the color, consistency, frequency of her stool. Denies hematochezia. Patient states that they have not tried any medications at home for her pain.   Patient has a history of depression. Current medications include sertraline 25 mg daily. This medication was started in 07/2022. Patient reports that they were compliant with this medication but have not taken in it in a month. They report symptom improvement after starting this medication but experienced blunted affect. Patient has not f/u w/ a psychiatrist and therapist previously but would like to see one. Patient reports decreased sleep, decreased energy, decreased activity, increased feelings of guilt, and difficulty concentrating.  Patient denies decreased interest in hobbies or visual/auditory hallucinations. Patient denies SI or HI.   Patient would not like the influenza vaccine today.   Allergies as of 01/25/2023       Reactions   Amoxicillin Hives   Cefuroxime Axetil Hives   Hydrocodone Itching   Levofloxacin Other (See Comments)   tendonitis   Morphine Itching   Oxycodone-acetaminophen Itching   Penicillins Hives   Has patient had a PCN reaction causing immediate rash, facial/tongue/throat swelling, SOB or lightheadedness with hypotension: Yes Has patient had a PCN reaction causing severe rash involving mucus membranes or skin necrosis: No Has patient had a PCN reaction that required hospitalization: No Has patient had a PCN reaction occurring within the last 10 years: No If all of the above answers are "NO", then may proceed with Cephalosporin use.   Propoxyphene Itching        Medication List        Accurate as of January 25, 2023  9:15 AM. If you have any questions, ask your nurse or doctor.          albuterol (2.5 MG/3ML) 0.083% nebulizer solution Commonly known as: PROVENTIL Take 3 mLs (2.5 mg total) by nebulization every 4 (four) hours as needed for wheezing or shortness of breath (coughing fits).   albuterol 108 (90 Base) MCG/ACT inhaler Commonly known as: VENTOLIN HFA PLEASE SEE ATTACHED FOR DETAILED DIRECTIONS   azithromycin 250 MG tablet Commonly known as: Zithromax Z-Pak Take 2 tablets on day 1, 1 tablet on days 2-5.   benzonatate 100 MG capsule Commonly known as: Tessalon Perles Take 1 capsule (100 mg total) by mouth 3 (three) times daily as needed for cough.   budesonide  0.5 MG/2ML nebulizer solution Commonly known as: Pulmicort Take 2 mLs (0.5 mg total) by nebulization in the morning and at bedtime.   chlorpheniramine-HYDROcodone 10-8 MG/5ML Commonly known as: TUSSIONEX Take 5 mLs by mouth 2 (two) times daily as needed for cough.   cyclobenzaprine 10 MG  tablet Commonly known as: FLEXERIL Take 1 tablet (10 mg total) by mouth 2 (two) times daily as needed for muscle spasms.   EQ Space Printmaker Inhale into the lungs as directed.   famotidine 20 MG tablet Commonly known as: PEPCID Take 1 tablet (20 mg total) by mouth 2 (two) times daily.   Fasenra Pen 30 MG/ML Soaj Generic drug: Benralizumab Inject into the skin.   fluticasone furoate-vilanterol 200-25 MCG/ACT Aepb Commonly known as: Breo Ellipta Inhale 1 puff into the lungs daily. Rinse mouth after each use.   montelukast 10 MG tablet Commonly known as: SINGULAIR TAKE 1 TABLET BY MOUTH EVERYDAY AT BEDTIME   Ryaltris 665-25 MCG/ACT Susp Generic drug: Olopatadine-Mometasone Place 1-2 sprays into the nose in the morning and at bedtime.   sertraline 25 MG tablet Commonly known as: ZOLOFT TAKE 1 TABLET (25 MG TOTAL) BY MOUTH DAILY.   Spiriva Respimat 1.25 MCG/ACT Aers Generic drug: Tiotropium Bromide Monohydrate Inhale 2 puffs into the lungs daily.         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   Physical Exam: Vitals:   01/25/23 0912  BP: 123/86  Pulse: 89  SpO2: 97%  Weight: 202 lb 6.4 oz (91.8 kg)  Height: '5\' 4"'$  (1.626 m)   Constitutional: Well-developed, well-nourished, appears comfortable   HENT: Normocephalic and atraumatic.  Eyes: EOM are normal. PERRL.  Neck: Normal range of motion.  Cardiovascular: Regular rate, regular rhythm. No murmurs, rubs, or gallops. Normal radial and PT pulses bilaterally. No LE edema.  Pulmonary: Normal respiratory effort. No wheezes, rales, or rhonchi.   Abdominal: Soft. Non-distended. Normal bowel sounds. Positive murphy's sign. No abdominal tenderness elsewhere. No rebound tenderness or guarding.  Musculoskeletal: Normal range of motion. Right CVA tenderness. No left CVA tenderness.    Neurological: Alert and oriented to person, place, and time. Non-focal. Skin: warm and dry.    Assessment & Plan:   See Encounters Tab for problem based charting.  Patient seen with Dr. Heber

## 2023-01-27 LAB — CMP14 + ANION GAP
ALT: 15 IU/L (ref 0–32)
AST: 17 IU/L (ref 0–40)
Albumin/Globulin Ratio: 2.1 (ref 1.2–2.2)
Albumin: 4.8 g/dL (ref 3.9–4.9)
Alkaline Phosphatase: 104 IU/L (ref 44–121)
Anion Gap: 16 mmol/L (ref 10.0–18.0)
BUN/Creatinine Ratio: 23 (ref 9–23)
BUN: 15 mg/dL (ref 6–24)
Bilirubin Total: 0.2 mg/dL (ref 0.0–1.2)
CO2: 24 mmol/L (ref 20–29)
Calcium: 9.3 mg/dL (ref 8.7–10.2)
Chloride: 100 mmol/L (ref 96–106)
Creatinine, Ser: 0.65 mg/dL (ref 0.57–1.00)
Globulin, Total: 2.3 g/dL (ref 1.5–4.5)
Glucose: 95 mg/dL (ref 70–99)
Potassium: 4.2 mmol/L (ref 3.5–5.2)
Sodium: 140 mmol/L (ref 134–144)
Total Protein: 7.1 g/dL (ref 6.0–8.5)
eGFR: 111 mL/min/{1.73_m2} (ref >59)

## 2023-01-27 LAB — LIPASE: Lipase: 43 U/L (ref 14–72)

## 2023-01-31 LAB — UA/M W/RFLX CULTURE, COMP
Bilirubin, UA: NEGATIVE
Glucose, UA: NEGATIVE
Ketones, UA: NEGATIVE
Leukocytes,UA: NEGATIVE
Nitrite, UA: NEGATIVE
Protein,UA: NEGATIVE
Specific Gravity, UA: 1.027 (ref 1.005–1.030)
Urobilinogen, Ur: 0.2 mg/dL (ref 0.2–1.0)
pH, UA: 5.5 (ref 5.0–7.5)

## 2023-01-31 LAB — MICROSCOPIC EXAMINATION: Casts: NONE SEEN /lpf

## 2023-01-31 LAB — URINE CULTURE, COMPREHENSIVE

## 2023-01-31 NOTE — Progress Notes (Signed)
Internal Medicine Clinic Attending  Case discussed with the resident at the time of the visit.  We reviewed the resident's history and exam and pertinent patient test results.  I agree with the assessment, diagnosis, and plan of care documented in the resident's note.  

## 2023-02-01 NOTE — Progress Notes (Unsigned)
Follow Up Note  RE: Erin Collins MRN: [id number] DOB: 1976/02/17 Date of Office Visit: 02/02/2023  Referring provider: Starlyn Skeans, MD Primary care provider: Starlyn Skeans, MD  Chief Complaint: No chief complaint on file.  History of Present Illness: I had the pleasure of seeing Erin Collins for a follow up visit at the [facility] [place] on 02/01/2023. She is a 47 y.o. female, who is being followed for asthma on Fasenra, allergic rhinoconjunctivitis, GERD, atopic dermatitis and multiple drug allergies. Her previous allergy office visit was on 11/01/2022 with Dr. Maudie Collins. Today is a regular follow up visit.  Acute sinusitis/bronchitis Still having symptoms and finished prednisone and has 3 days of doxycycline left.  Steroid injection given today. Stop doxycycline. Start Zpak - 2 tablets on day 1, then 1 tablet days 2-5. See below for symptomatic management. May take Tussionex 19m twice a day as needed. If it makes you itchy, take benadryl and stop using.  Stop tessalon perles. Take mucinex twice a day with plenty of water to loosen up the phlegm.   Not well controlled severe persistent asthma Past history - Diagnosed with asthma over 19 years ago. Patient moved in with her parents who have cats and dogs at home during this time. Did not tolerate Trelegy, Breztri no as effective. Flares in the spring and fall.  Interim history - symptoms flaring. Today's spirometry showed some restriction.  Steroid shot given today. Start Fasenra next Tuesday - will keep in our fridge.  Daily controller medication(s):  Continue Breo 2090m 1 puff once a day and rinse mouth after each use.  Continue Spiriva 1.2512m2 puff once a day.  Continue Singulair '10mg'$  daily During upper respiratory infections/flares:  Start budesonide (Pulmicort) 0.'5mg'$  nebulizer for 1-2 weeks until your breathing symptoms return to baseline.  Pretreat with albuterol 2 puffs or albuterol nebulizer.  If you need to  use your albuterol nebulizer machine back to back within 15-30 minutes with no relief then please go to the ER/urgent care for further evaluation.  May use albuterol rescue inhaler 2 puffs or nebulizer every 4 to 6 hours as needed for shortness of breath, chest tightness, coughing, and wheezing. May use albuterol rescue inhaler 2 puffs 5 to 15 minutes prior to strenuous physical activities. Monitor frequency of use.  Get spirometry at next visit.   Seasonal and perennial allergic rhinoconjunctivitis Past history - Perennial rhinoconjunctivitis symptoms for the past 20 years with worsening in the spring and fall.  Allergy immunotherapy for 10 years with good benefit. 2019 skin testing showed, positive to dust mites, cat, dog, grass and horse. 2 cats at home. 2022 bloodwork positive to dust mites, cat, dog, grass pollen. Borderline to cockroach, mouse and maple/box elder tree pollen.  Continue environmental control measures - especially regarding the cats. Continue Singulair (montelukast) '10mg'$  daily.  Use over the counter antihistamines such as Zyrtec (cetirizine), Claritin (loratadine), Allegra (fexofenadine), or Xyzal (levocetirizine) daily as needed. May take twice a day during allergy flares. May switch antihistamines every few months. Continue Ryaltris (olopatadine + mometasone nasal spray combination) 1-2 sprays per nostril twice a day.  Nasal saline spray (i.e., Simply Saline) or nasal saline lavage (i.e., NeilMed) is recommended as needed and prior to medicated nasal sprays. May use refresh eye drops.  Need asthma to be more stable before starting AIT.   Gastroesophageal reflux disease Continue lifestyle and dietary modifications. Continue famotidine '20mg'$  twice a day.   Other atopic dermatitis Past history - EucNepal  not effective.  Continue proper skin care measures.  May use triamcinolone 0.1% ointment twice a day as needed for the hands. Do not use on the face, neck, armpits or groin  area. Do not use more than 3 weeks in a row.    Multiple drug allergies Past history - reactions to amoxicillin, cefuroxime, hydrocodone, levofloxacin, morphine, oxycodone, and propoxyphene in the past.  Continue to avoid all these medications.  Consider penicillin skin testing/drug challenge in the future. More than 90% of patients outgrow their penicillin allergy.    Return in about 3 months (around 02/01/2023).  Assessment and Plan: Erin Collins is a 47 y.o. female with: No problem-specific Assessment & Plan notes found for this encounter.  No follow-ups on file.  No orders of the defined types were placed in this encounter.  Lab Orders  No laboratory test(s) ordered today    Diagnostics: Spirometry:  Tracings reviewed. Her effort: {Blank single:19197::"Good reproducible efforts.","It was hard to get consistent efforts and there is a question as to whether this reflects a maximal maneuver.","Poor effort, data can not be interpreted."} FVC: ***L FEV1: ***L, ***% predicted FEV1/FVC ratio: ***% Interpretation: {Blank single:19197::"Spirometry consistent with mild obstructive disease","Spirometry consistent with moderate obstructive disease","Spirometry consistent with severe obstructive disease","Spirometry consistent with possible restrictive disease","Spirometry consistent with mixed obstructive and restrictive disease","Spirometry uninterpretable due to technique","Spirometry consistent with normal pattern","No overt abnormalities noted given today's efforts"}.  Please see scanned spirometry results for details.  Skin Testing: {Blank single:19197::"Select foods","Environmental allergy panel","Environmental allergy panel and select foods","Food allergy panel","None","Deferred due to recent antihistamines use"}. *** Results discussed with patient/family.   Medication List:  Current Outpatient Medications  Medication Sig Dispense Refill   albuterol (PROVENTIL) (2.5 MG/3ML) 0.083%  nebulizer solution Take 3 mLs (2.5 mg total) by nebulization every 4 (four) hours as needed for wheezing or shortness of breath (coughing fits). 75 mL 1   albuterol (VENTOLIN HFA) 108 (90 Base) MCG/ACT inhaler PLEASE SEE ATTACHED FOR DETAILED DIRECTIONS 8.5 each 1   azithromycin (ZITHROMAX Z-PAK) 250 MG tablet Take 2 tablets on day 1, 1 tablet on days 2-5. 6 each 0   benzonatate (TESSALON PERLES) 100 MG capsule Take 1 capsule (100 mg total) by mouth 3 (three) times daily as needed for cough. 20 capsule 3   budesonide (PULMICORT) 0.5 MG/2ML nebulizer solution Take 2 mLs (0.5 mg total) by nebulization in the morning and at bedtime. 120 mL 2   chlorpheniramine-HYDROcodone (TUSSIONEX) 10-8 MG/5ML Take 5 mLs by mouth 2 (two) times daily as needed for cough. 70 mL 0   citalopram (CELEXA) 20 MG tablet Take 1 tablet (20 mg total) by mouth daily. 60 tablet 2   cyclobenzaprine (FLEXERIL) 10 MG tablet Take 1 tablet (10 mg total) by mouth 2 (two) times daily as needed for muscle spasms. 20 tablet 0   famotidine (PEPCID) 20 MG tablet Take 1 tablet (20 mg total) by mouth 2 (two) times daily. 60 tablet 3   FASENRA PEN 30 MG/ML SOAJ Inject into the skin. (Patient not taking: Reported on 11/01/2022)     fluticasone furoate-vilanterol (BREO ELLIPTA) 200-25 MCG/ACT AEPB Inhale 1 puff into the lungs daily. Rinse mouth after each use. (Patient not taking: Reported on 11/01/2022) 60 each 5   montelukast (SINGULAIR) 10 MG tablet TAKE 1 TABLET BY MOUTH EVERYDAY AT BEDTIME 90 tablet 1   Olopatadine-Mometasone (RYALTRIS) 665-25 MCG/ACT SUSP Place 1-2 sprays into the nose in the morning and at bedtime. 29 g 5   Spacer/Aero-Holding Chambers (EQ SPACE CHAMBER  ANTI-STATIC) DEVI Inhale into the lungs as directed.     Tiotropium Bromide Monohydrate (SPIRIVA RESPIMAT) 1.25 MCG/ACT AERS Inhale 2 puffs into the lungs daily. 4 g 5   Current Facility-Administered Medications  Medication Dose Route Frequency Provider Last Rate Last  Admin   Benralizumab SOSY 30 mg  30 mg Subcutaneous Q28 days Garnet Sierras, DO   30 mg at 01/17/23 0848   methylPREDNISolone acetate (DEPO-MEDROL) injection 80 mg  80 mg Intramuscular Once Garnet Sierras, DO       Allergies: Allergies  Allergen Reactions   Amoxicillin Hives   Cefuroxime Axetil Hives   Hydrocodone Itching   Levofloxacin Other (See Comments)    tendonitis   Morphine Itching   Oxycodone-Acetaminophen Itching   Penicillins Hives    Has patient had a PCN reaction causing immediate rash, facial/tongue/throat swelling, SOB or lightheadedness with hypotension: Yes Has patient had a PCN reaction causing severe rash involving mucus membranes or skin necrosis: No Has patient had a PCN reaction that required hospitalization: No Has patient had a PCN reaction occurring within the last 10 years: No If all of the above answers are "NO", then may proceed with Cephalosporin use.    Propoxyphene Itching   I reviewed her past medical history, social history, family history, and environmental history and no significant changes have been reported from her previous visit.  Review of Systems  Constitutional:  Negative for appetite change, chills, fatigue, fever and unexpected weight change.  HENT:  Positive for congestion and rhinorrhea.   Eyes:  Negative for itching.  Respiratory:  Positive for cough, chest tightness, shortness of breath and wheezing.   Cardiovascular:  Negative for chest pain.  Gastrointestinal:  Negative for abdominal pain.  Genitourinary:  Negative for difficulty urinating.  Skin:  Negative for rash.  Allergic/Immunologic: Positive for environmental allergies. Negative for food allergies.    Objective: There were no vitals taken for this visit. There is no height or weight on file to calculate BMI. Physical Exam Vitals and nursing note reviewed.  Constitutional:      Appearance: Normal appearance. She is well-developed.  HENT:     Head: Normocephalic and  atraumatic.     Right Ear: Tympanic membrane and external ear normal.     Left Ear: Tympanic membrane and external ear normal.     Nose: Nose normal.     Mouth/Throat:     Mouth: Mucous membranes are moist.     Pharynx: Oropharynx is clear.  Eyes:     Conjunctiva/sclera: Conjunctivae normal.  Cardiovascular:     Rate and Rhythm: Normal rate and regular rhythm.     Heart sounds: Normal heart sounds. No murmur heard. Pulmonary:     Effort: Pulmonary effort is normal.     Breath sounds: Normal breath sounds. No wheezing, rhonchi or rales.  Musculoskeletal:     Cervical back: Neck supple.  Skin:    General: Skin is warm.     Findings: No rash.  Neurological:     Mental Status: She is alert and oriented to person, place, and time.  Psychiatric:        Behavior: Behavior normal.    Previous notes and tests were reviewed. The plan was reviewed with the patient/family, and all questions/concerned were addressed.  It was my pleasure to see Palmer today and participate in her care. Please feel free to contact me with any questions or concerns.  Sincerely,  Rexene Alberts, DO Allergy & Immunology  [facility] office: [place] office: [phone]

## 2023-02-02 ENCOUNTER — Ambulatory Visit (HOSPITAL_COMMUNITY)
Admission: RE | Admit: 2023-02-02 | Discharge: 2023-02-02 | Disposition: A | Discharge status: Home / Self Care | Payer: BC Managed Care – PPO | Source: Ambulatory Visit | Attending: Internal Medicine | Admitting: Internal Medicine

## 2023-02-02 ENCOUNTER — Encounter: Payer: Self-pay | Admitting: Allergy

## 2023-02-02 ENCOUNTER — Other Ambulatory Visit: Payer: Self-pay

## 2023-02-02 ENCOUNTER — Ambulatory Visit (INDEPENDENT_AMBULATORY_CARE_PROVIDER_SITE_OTHER): Payer: BC Managed Care – PPO | Admitting: Allergy

## 2023-02-02 VITALS — BP 124/76 | HR 97 | Temp 98.0°F | Resp 20 | Ht 64.0 in | Wt 204.0 lb

## 2023-02-02 DIAGNOSIS — J302 Other seasonal allergic rhinitis: Secondary | ICD-10-CM

## 2023-02-02 DIAGNOSIS — R053 Chronic cough: Secondary | ICD-10-CM

## 2023-02-02 DIAGNOSIS — J4550 Severe persistent asthma, uncomplicated: Secondary | ICD-10-CM | POA: Diagnosis not present

## 2023-02-02 DIAGNOSIS — R109 Unspecified abdominal pain: Secondary | ICD-10-CM | POA: Insufficient documentation

## 2023-02-02 DIAGNOSIS — J3089 Other allergic rhinitis: Secondary | ICD-10-CM

## 2023-02-02 DIAGNOSIS — H1013 Acute atopic conjunctivitis, bilateral: Secondary | ICD-10-CM

## 2023-02-02 DIAGNOSIS — Z889 Allergy status to unspecified drugs, medicaments and biological substances status: Secondary | ICD-10-CM | POA: Diagnosis not present

## 2023-02-02 DIAGNOSIS — L2089 Other atopic dermatitis: Secondary | ICD-10-CM

## 2023-02-02 DIAGNOSIS — K219 Gastro-esophageal reflux disease without esophagitis: Secondary | ICD-10-CM

## 2023-02-02 MED ORDER — PANTOPRAZOLE SODIUM 40 MG PO TBEC: 40.0000 mg | DELAYED_RELEASE_TABLET | Freq: Every morning | ORAL | 2 refills | Status: DC

## 2023-02-02 MED ORDER — PREDNISONE 10 MG PO TABS: ORAL_TABLET | ORAL | 0 refills | Status: DC

## 2023-02-02 MED ORDER — IPRATROPIUM BROMIDE 0.03 % NA SOLN: 1.0000 | Freq: Two times a day (BID) | NASAL | 5 refills | Status: DC | PRN

## 2023-02-02 NOTE — Assessment & Plan Note (Signed)
Past history - Diagnosed with asthma over 19 years ago. Patient moved in with her parents who have cats and dogs at home during this time. Did not tolerate Trelegy, Breztri no as effective. Flares in the spring and fall.  Interim history - still having daily symptoms and using albuterol multiple times a day with some benefit. No additional prednisone. Did not use nebulizer machine. No benefit with Fasenra.  Today's spirometry showed some restriction - just took albuterol 1 hour before the visit. Take prednisone 78m daily x 2 days, 32mdaily x 2 days, 2079maily x 2 days and 71m71mily x 2 days. Get Chest X-ray if not doing better.  Stop Fasenra. Will start PA for [place] injections every 4 weeks.  Daily controller medication(s):  Continue Breo 200mc57mpuff once a day and rinse mouth after each use.  Continue Spiriva 1.25mcg6muff once a day.  Continue Singulair 71mg d44m During respiratory infections/flares:  Start budesonide (Pulmicort) 0.5mg neb69mzer for 1-2 weeks until your breathing symptoms return to baseline.  Pretreat with albuterol 2 puffs or albuterol nebulizer.  If you need to use your albuterol nebulizer machine back to back within 15-30 minutes with no relief then please go to the ER/urgent care for further evaluation.  May use albuterol rescue inhaler 2 puffs or nebulizer every 4 to 6 hours as needed for shortness of breath, chest tightness, coughing, and wheezing. May use albuterol rescue inhaler 2 puffs 5 to 15 minutes prior to strenuous physical activities. Monitor frequency of use.  Get spirometry at next visit.

## 2023-02-02 NOTE — Assessment & Plan Note (Signed)
Past history - reactions to amoxicillin, cefuroxime, hydrocodone, levofloxacin, morphine, oxycodone, and propoxyphene in the past.  Continue to avoid all these medications.  Consider penicillin skin testing/drug challenge in the future. More than 90% of patients outgrow their penicillin allergy.

## 2023-02-02 NOTE — Assessment & Plan Note (Signed)
Not controlled.  Continue lifestyle and dietary modifications. Continue famotidine 58m twice a day. Start pantoprazole 466mone a day in the morning with nothing to eat or drink for 30 min afterwards.

## 2023-02-02 NOTE — Assessment & Plan Note (Signed)
Past history - Eucrisa not effective.  Interim history - got a facial and now breaking out on the neck. Continue proper skin care measures. Use epicerum moisturizer - samples given. Use hydrocortisone 1% cream twice a day as needed for mild rash flares - okay to use on the face, neck, groin area. Do not use more than 1 week at a time. The oral prednisone will help with the rash on the neck too - most likely contact irritation from the exfoliation.

## 2023-02-02 NOTE — Assessment & Plan Note (Signed)
Past history - Perennial rhinoconjunctivitis symptoms for the past 20 years with worsening in the spring and fall.  Allergy immunotherapy for 10 years with good benefit. 2019 skin testing showed, positive to dust mites, cat, dog, grass and horse. 2 cats at home. 2022 bloodwork positive to dust mites, cat, dog, grass pollen. Borderline to cockroach, mouse and maple/box elder tree pollen.  Interim history - Ryaltris burns, still having drainage.  Continue environmental control measures - especially regarding the cats. Continue Singulair (montelukast) 71m daily.  Use over the counter antihistamines such as Zyrtec (cetirizine), Claritin (loratadine), Allegra (fexofenadine), or Xyzal (levocetirizine) daily as needed. May take twice a day during allergy flares. May switch antihistamines every few months. Use Flonase (fluticasone) nasal spray 1 spray per nostril twice a day as needed for nasal congestion.  Use Atrovent (ipratropium) 0.03% 1-2 sprays per nostril twice a day as needed for runny nose/drainage. Nasal saline spray (i.e., Simply Saline) or nasal saline lavage (i.e., NeilMed) is recommended as needed and prior to medicated nasal sprays. May use refresh eye drops. Need asthma to be more stable before starting AIT.

## 2023-02-02 NOTE — Patient Instructions (Addendum)
Moderate persistent asthma Take prednisone 55m daily x 2 days, 33mdaily x 2 days, 201maily x 2 days and 68m26mily x 2 days. Get Chest X-ray if not doing better.   Stop Fasenra. Will start Tezspire injections every 4 weeks.   Daily controller medication(s):  Continue Breo 200mc20mpuff once a day and rinse mouth after each use.  Continue Spiriva 1.25mcg52muff once a day.  Continue Singulair 68mg d59m During respiratory infections/flares:  Start budesonide (Pulmicort) 0.5mg neb68mzer for 1-2 weeks until your breathing symptoms return to baseline.  Pretreat with albuterol 2 puffs or albuterol nebulizer.  If you need to use your albuterol nebulizer machine back to back within 15-30 minutes with no relief then please go to the ER/urgent care for further evaluation.  May use albuterol rescue inhaler 2 puffs or nebulizer every 4 to 6 hours as needed for shortness of breath, chest tightness, coughing, and wheezing. May use albuterol rescue inhaler 2 puffs 5 to 15 minutes prior to strenuous physical activities. Monitor frequency of use.  Asthma control goals:  Full participation in all desired activities (may need albuterol before activity) Albuterol use two times or less a week on average (not counting use with activity) Cough interfering with sleep two times or less a month Oral steroids no more than once a year No hospitalizations    Seasonal and perennial allergic rhinoconjunctivitis 2022 bloodwork positive to dust mites, cat, dog, grass pollen. Borderline to cockroach, mouse and maple/box elder tree pollen.  Continue environmental control measures - especially regarding the cats. Continue Singulair (montelukast) 68mg dai33m Use over the counter antihistamines such as Zyrtec (cetirizine), Claritin (loratadine), Allegra (fexofenadine), or Xyzal (levocetirizine) daily as needed. May take twice a day during allergy flares. May switch antihistamines every few months. Use Flonase  (fluticasone) nasal spray 1 spray per nostril twice a day as needed for nasal congestion.  Use Atrovent (ipratropium) 0.03% 1-2 sprays per nostril twice a day as needed for runny nose/drainage. Nasal saline spray (i.e., Simply Saline) or nasal saline lavage (i.e., NeilMed) is recommended as needed and prior to medicated nasal sprays. May use refresh eye drops.   Heartburn Continue lifestyle and dietary modifications. Continue famotidine 20mg twic44mday. Start pantoprazole 40mg one a6m in the morning with nothing to eat or drink for 30 min afterwards.   Other atopic dermatitis Continue proper skin care measures. Use epicerum moisturizer - samples given. Use hydrocortisone 1% cream twice a day as needed for mild rash flares - okay to use on the face, neck, groin area. Do not use more than 1 week at a time. The oral prednisone will help with the rash on the neck too - most likely contact irritation from the exfoliation.    Multiple drug allergies Continue to avoid medications on your allergy list.  Consider penicillin skin testing/drug challenge in the future. If interested we can schedule drug challenge to penicillin. You must be off antihistamines for 3-5 days before. Must be in good health and not ill. Plan on being in the office for 2-3 hours and must bring in the drug you want to do the oral challenge for - will send in prescription to pick up a few days before. You must call to schedule an appointment and specify it's for a drug challenge.  More than 90% of patients outgrow their penicillin allergy.    Follow up in 1 months or sooner if needed.

## 2023-02-03 ENCOUNTER — Other Ambulatory Visit: Payer: Self-pay | Admitting: Internal Medicine

## 2023-02-03 DIAGNOSIS — F320 Major depressive disorder, single episode, mild: Secondary | ICD-10-CM

## 2023-02-08 ENCOUNTER — Ambulatory Visit: Payer: BC Managed Care – PPO | Admitting: Student

## 2023-02-08 ENCOUNTER — Encounter: Payer: Self-pay | Admitting: Student

## 2023-02-08 VITALS — BP 154/79 | HR 65 | Ht 64.0 in | Wt 205.5 lb

## 2023-02-08 DIAGNOSIS — E669 Obesity, unspecified: Secondary | ICD-10-CM

## 2023-02-08 DIAGNOSIS — R030 Elevated blood-pressure reading, without diagnosis of hypertension: Secondary | ICD-10-CM

## 2023-02-08 DIAGNOSIS — E8881 Metabolic syndrome: Secondary | ICD-10-CM | POA: Diagnosis not present

## 2023-02-08 DIAGNOSIS — R1011 Right upper quadrant pain: Secondary | ICD-10-CM

## 2023-02-08 DIAGNOSIS — J4550 Severe persistent asthma, uncomplicated: Secondary | ICD-10-CM | POA: Diagnosis not present

## 2023-02-08 DIAGNOSIS — R1031 Right lower quadrant pain: Secondary | ICD-10-CM | POA: Diagnosis not present

## 2023-02-08 DIAGNOSIS — Z0000 Encounter for general adult medical examination without abnormal findings: Secondary | ICD-10-CM

## 2023-02-08 DIAGNOSIS — R109 Unspecified abdominal pain: Secondary | ICD-10-CM

## 2023-02-08 LAB — POCT GLYCOSYLATED HEMOGLOBIN (HGB A1C): Hemoglobin A1C: 5.2 % (ref 4.0–5.6)

## 2023-02-08 LAB — GLUCOSE, CAPILLARY: Glucose-Capillary: 102 mg/dL — ABNORMAL HIGH (ref 70–99)

## 2023-02-08 MED ORDER — MONTELUKAST SODIUM 10 MG PO TABS: ORAL_TABLET | ORAL | 1 refills | Status: DC

## 2023-02-08 NOTE — Assessment & Plan Note (Signed)
Patient would like to discuss treatment for her weight gain.  Ever since her ankle surgery in May, patient has been more inactive.  She reports more short of breath with exertion due to her asthma and weight gain.  Her ankle stiffness also is contributing.  Patient is watching her diet and exercise if she can.  Her weight is trending up slowly from 170s in 2022, to 190s om 2023 and now 200s.  We discussed weight loss options which includes physician supervised exercise program, follow-up with Debera Lat for diet modification and possibly started on GLP-1.  -Patient agrees with the exercise program and follow-up with dietitian.  Order placed. -Check A1c today.  Depends on the result, will discuss possible starting GLP-1

## 2023-02-08 NOTE — Assessment & Plan Note (Signed)
Blood pressure elevated today at 160/104, repeat 154/79.  Her blood pressure was well-controlled in the past.  This was a first elevated blood pressure reading.   -Recheck blood pressure at next visit

## 2023-02-08 NOTE — Assessment & Plan Note (Signed)
-  She declined flu shot today.  She has had bad reaction with flu shot in the past.  She understands the importance of flu shot in the setting of her asthma. -GI referral for colonoscopy

## 2023-02-08 NOTE — Progress Notes (Signed)
CC: 2 weeks follow-up for abdominal pain  HPI:  Erin Collins is a 47 y.o. lady with asthma, atopic dermatitis, allergies, chronic bradycardia abdominal pain from who presents to the clinic to follow-up on her right flank pain.  Please see problem based charting for detail  Past Medical History:  Diagnosis Date   Anemia    Angio-edema    Asthma    Basal cell carcinoma ~ 1999   collarbone   Blood clot in vein ~ 2008   "left breast"    Complication of anesthesia    "I have trouble waking up afterwards" (12/18/2017)   Diarrhea 04/01/2019   Eczema    Family history of adverse reaction to anesthesia    "mom has trouble waking up afterwards" (12/18/2017)   Fluttering heart    "comes and goes" (12/18/2017)   GERD (gastroesophageal reflux disease)    Headache    "weekly" (12/18/2017)   History of kidney stones    Recurrent upper respiratory infection (URI)    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    Appearance: She is not ill-appearing.  HENT:     Head: Normocephalic.  Eyes:     General:        Right eye: No discharge.        Left eye: No discharge.     Conjunctiva/sclera: Conjunctivae normal.  Cardiovascular:     Rate and Rhythm: Normal rate and regular rhythm.  Pulmonary:     Effort: Pulmonary effort is normal. No respiratory distress.     Breath sounds: Normal breath sounds. No wheezing.  Abdominal:     General: Bowel sounds are normal.     Palpations: Abdomen is soft.     Tenderness: There is no right CVA tenderness or left CVA tenderness.     Comments: Negative Murphy sign.  Mild tenderness to palpation right upper quadrant.  Hypoactive bowel sounds.   Musculoskeletal:        General: Normal range of motion.     Cervical back: Normal range of motion.  Skin:    General: Skin is warm.  Neurological:     Mental Status: She is alert. Mental status is at baseline.  Psychiatric:        Mood and Affect: Mood normal.      Assessment & Plan:   See Encounters Tab for problem based charting.  Abdominal pain, right upper quadrant This is a chronic issue dated back at least 10 years ago.  Patient reportedly has had 2 HIDA scan which was unremarkable.  She also had an EGD and colonoscopy which was unremarkable.  Record not found in the chart.  She was following a GI doctor who diagnosed her with IBS.  She did not receive treatment for IBS.  States that her abdominal pain resolved on its own and just returned recently.  Reports right upper quadrant pain during and after eating.  Her bowel habits was normal but reports more constipated in the last 5 days.  She reports 1 episode of nausea and vomiting  3 days ago.  She reports severe GERD symptoms that she is on both H2 blocker and PPI.  Workup at last visit showed normal lipase, CMP. RUQ Ultrasound did not show any gallstones or signs of cholecystitis.  Abdominal exam was unremarkable.  Without gallstones, I do not think this is a gallbladder disease.  IBS is certainly on the differential.  Will refer patient to GI for further evaluation.  She may need a repeat EGD due to severe GERD symptoms.  She is also due for colonoscopy.  Right flank pain This is an acute issue that started 3 weeks ago.  Originally pain localized in the right CVA and radiates to the groin.  States the pain has improved but never completely resolved.  Pain is intermittent, not affected by urination.  Reports pain is 2/10.  She felt like this is similar to her last pyelonephritis in 2020.  UA was negative for nitrites and leukocytes but positive for hematuria and pyuria.  Urine culture was negative.  Kidney function on CMP was  normal.  Negative for CVA tenderness to palpation bilaterally on exam.  Her symptoms and UA is consistent with renal cholelithiasis.  No signs of infection.  Will obtain a CT renal stone to rule in or rule out a kidney stone.  If there is signs of obstruction, will refer to urology.  Obesity (BMI 30-39.9) Patient would like to discuss treatment for her weight gain.  Ever since her ankle surgery in May, patient has been more inactive.  She reports more short of breath with exertion due to her asthma and weight gain.  Her ankle stiffness also is contributing.  Patient is watching her diet and exercise if she can.  Her weight is trending up slowly from 170s in 2022, to 190s om 2023 and now 200s.  We discussed weight loss options which includes physician supervised exercise program, follow-up with Debera Lat for diet modification and possibly started on GLP-1.  -Patient agrees with the exercise program and follow-up with dietitian.  Order placed. -Check A1c today.  Depends on the result, will discuss possible starting GLP-1  Elevated blood pressure reading Blood pressure elevated today at 160/104, repeat 154/79.  Her blood pressure was well-controlled in the past.  This was a first elevated blood pressure reading.   -Recheck blood pressure at next visit  Healthcare maintenance -She declined flu shot today.  She has had bad reaction with flu shot in the past.  She understands the importance of flu shot in the setting of her asthma. -GI referral for colonoscopy   Patient discussed with Dr. Heber

## 2023-02-08 NOTE — Assessment & Plan Note (Signed)
This is a chronic issue dated back at least 10 years ago.  Patient reportedly has had 2 HIDA scan which was unremarkable.  She also had an EGD and colonoscopy which was unremarkable.  Record not found in the chart.  She was following a GI doctor who diagnosed her with IBS.  She did not receive treatment for IBS.  States that her abdominal pain resolved on its own and just returned recently.  Reports right upper quadrant pain during and after eating.  Her bowel habits was normal but reports more constipated in the last 5 days.  She reports 1 episode of nausea and vomiting 3 days ago.  She reports severe GERD symptoms that she is on both H2 blocker and PPI.  Workup at last visit showed normal lipase, CMP. RUQ Ultrasound did not show any gallstones or signs of cholecystitis.  Abdominal exam was unremarkable.  Without gallstones, I do not think this is a gallbladder disease.  IBS is certainly on the differential.  Will refer patient to GI for further evaluation.  She may need a repeat EGD due to severe GERD symptoms.  She is also due for colonoscopy.

## 2023-02-08 NOTE — Patient Instructions (Signed)
Ms. Braun,  It was nice seeing you in the clinic today.  Here is a summary of what we talked about:  1.  For your back pain, I am suspecting kidney stone.  I will obtain a CT scan to rule in or rule out a stone.  2.  I placed a referral to a gastroenterologist for your chronic abdominal pain.  Please bring your records of EGD and colonoscopy to the visit.  3.  I placed a referral to physician supervised exercise program and follow-up with Debera Lat, our dietitian.  Depends on your A1c result, we will discuss with use of medication for weight loss.  Please follow-up in 3 months, sooner if needed  Take care  Dr. Alfonse Spruce

## 2023-02-08 NOTE — Assessment & Plan Note (Addendum)
>>  ASSESSMENT AND PLAN FOR ABDOMINAL PAIN OF UNKNOWN CAUSE WRITTEN ON 02/08/2023  9:42 AM BY Julliette Frentz, DO  This is an acute issue that started 3 weeks ago.  Originally pain localized in the right CVA and radiates to the groin.  States the pain has improved but never completely resolved.  Pain is intermittent, not affected by urination.  Reports pain is 2/10.  She felt like this is similar to her last pyelonephritis in 2020.  UA was negative for nitrites and leukocytes but positive for hematuria and pyuria.  Urine culture was negative.  Kidney function on CMP was normal.  Negative for CVA tenderness to palpation bilaterally on exam.  Her symptoms and UA is consistent with renal cholelithiasis.  No signs of infection.  Will obtain a CT renal stone to rule in or rule out a kidney stone.  If there is signs of obstruction, will refer to urology.   >>ASSESSMENT AND PLAN FOR ABDOMINAL PAIN, RIGHT UPPER QUADRANT WRITTEN ON 02/08/2023  9:39 AM BY Dona Klemann, DO  This is a chronic issue dated back at least 10 years ago.  Patient reportedly has had 2 HIDA scan which was unremarkable.  She also had an EGD and colonoscopy which was unremarkable.  Record not found in the chart.  She was following a GI doctor who diagnosed her with IBS.  She did not receive treatment for IBS.  States that her abdominal pain resolved on its own and just returned recently.  Reports right upper quadrant pain during and after eating.  Her bowel habits was normal but reports more constipated in the last 5 days.  She reports 1 episode of nausea and vomiting 3 days ago.  She reports severe GERD symptoms that she is on both H2 blocker and PPI.  Workup at last visit showed normal lipase, CMP. RUQ Ultrasound did not show any gallstones or signs of cholecystitis.  Abdominal exam was unremarkable.  Without gallstones, I do not think this is a gallbladder disease.  IBS is certainly on the differential.  Will refer patient to GI for further  evaluation.  She may need a repeat EGD due to severe GERD symptoms.  She is also due for colonoscopy.

## 2023-02-10 ENCOUNTER — Telehealth: Payer: Self-pay

## 2023-02-10 NOTE — Progress Notes (Signed)
Internal Medicine Clinic Attending  Case discussed with the resident at the time of the visit.  We reviewed the resident's history and exam and pertinent patient test results.  I agree with the assessment, diagnosis, and plan of care documented in the resident's note.  

## 2023-02-10 NOTE — Telephone Encounter (Signed)
VMT pt requesting call back reference PREP referral

## 2023-02-11 ENCOUNTER — Other Ambulatory Visit: Payer: Self-pay | Admitting: Allergy

## 2023-02-11 ENCOUNTER — Other Ambulatory Visit: Payer: Self-pay | Admitting: Podiatry

## 2023-02-13 ENCOUNTER — Telehealth: Payer: Self-pay | Admitting: *Deleted

## 2023-02-13 NOTE — Telephone Encounter (Signed)
-----   Message from Garnet Sierras, DO sent at 02/02/2023  4:48 PM EST ----- Please start PA for Tezspire. Currently on Fasenra and ineffective. Thank you.

## 2023-02-13 NOTE — Telephone Encounter (Signed)
L/m for patient to contact me to advised approval, copay card and submit to [facility] for [employer]

## 2023-02-14 ENCOUNTER — Ambulatory Visit: Payer: BC Managed Care – PPO

## 2023-02-21 NOTE — Telephone Encounter (Signed)
L/m for patient to contact me again ?

## 2023-02-21 NOTE — Telephone Encounter (Signed)
Spoke to patient and advised approval and submit to [facility] for Tezspire to replace [employer]

## 2023-03-04 ENCOUNTER — Encounter (HOSPITAL_COMMUNITY): Payer: Self-pay | Admitting: Psychiatry

## 2023-03-04 ENCOUNTER — Ambulatory Visit (HOSPITAL_BASED_OUTPATIENT_CLINIC_OR_DEPARTMENT_OTHER): Payer: BC Managed Care – PPO | Admitting: Psychiatry

## 2023-03-04 DIAGNOSIS — F331 Major depressive disorder, recurrent, moderate: Secondary | ICD-10-CM

## 2023-03-04 DIAGNOSIS — F411 Generalized anxiety disorder: Secondary | ICD-10-CM
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
## 2023-03-07 ENCOUNTER — Other Ambulatory Visit: Payer: Self-pay

## 2023-03-07 ENCOUNTER — Encounter: Payer: Self-pay | Admitting: Allergy

## 2023-03-07 ENCOUNTER — Ambulatory Visit: Payer: BC Managed Care – PPO | Admitting: Allergy

## 2023-03-07 VITALS — BP 132/80 | HR 90 | Temp 98.0°F | Resp 20 | Ht 64.0 in | Wt 195.8 lb

## 2023-03-07 DIAGNOSIS — L2089 Other atopic dermatitis: Secondary | ICD-10-CM

## 2023-03-07 DIAGNOSIS — Z889 Allergy status to unspecified drugs, medicaments and biological substances status: Secondary | ICD-10-CM | POA: Diagnosis not present

## 2023-03-07 DIAGNOSIS — J4550 Severe persistent asthma, uncomplicated: Secondary | ICD-10-CM | POA: Diagnosis not present

## 2023-03-07 DIAGNOSIS — J302 Other seasonal allergic rhinitis: Secondary | ICD-10-CM

## 2023-03-07 DIAGNOSIS — H1010 Acute atopic conjunctivitis, unspecified eye: Secondary | ICD-10-CM

## 2023-03-07 DIAGNOSIS — K219 Gastro-esophageal reflux disease without esophagitis: Secondary | ICD-10-CM

## 2023-03-07 DIAGNOSIS — H1013 Acute atopic conjunctivitis, bilateral: Secondary | ICD-10-CM

## 2023-03-07 MED ORDER — BUDESONIDE 0.5 MG/2ML IN SUSP: 0.5000 mg | Freq: Two times a day (BID) | RESPIRATORY_TRACT | 2 refills | Status: DC

## 2023-03-07 NOTE — Assessment & Plan Note (Signed)
Past history - reactions to amoxicillin, cefuroxime, hydrocodone, levofloxacin, morphine, oxycodone, and propoxyphene in the past.  Continue to avoid all these medications.  Consider penicillin skin testing/drug challenge in the future. More than 90% of patients outgrow their penicillin allergy.  

## 2023-03-07 NOTE — Assessment & Plan Note (Signed)
Past history - Perennial rhinoconjunctivitis symptoms for the past 20 years with worsening in the spring and fall.  Allergy immunotherapy for 10 years with good benefit. 2019 skin testing showed, positive to dust mites, cat, dog, grass and horse. 2 cats at home. 2022 bloodwork positive to dust mites, cat, dog, grass pollen. Borderline to cockroach, mouse and maple/box elder tree pollen.  Interim history - Atrovent burns her sinuses. Continue environmental control measures - especially regarding the cats. Continue Singulair (montelukast) 10mg  daily.  Use over the counter antihistamines such as Zyrtec (cetirizine), Claritin (loratadine), Allegra (fexofenadine), or Xyzal (levocetirizine) daily as needed. May take twice a day during allergy flares. May switch antihistamines every few months. Use Flonase (fluticasone) nasal spray 1 spray per nostril twice a day as needed for nasal congestion.  Nasal saline spray (i.e., Simply Saline) or nasal saline lavage (i.e., NeilMed) is recommended as needed and prior to medicated nasal sprays. May use refresh eye drops.  Need asthma to be more stable before starting AIT.

## 2023-03-07 NOTE — Assessment & Plan Note (Signed)
Past history - Eucrisa not effective.  Continue proper skin care measures. Use epicerum moisturizer. Use hydrocortisone 1% cream twice a day as needed for mild rash flares - okay to use on the face, neck, groin area. Do not use more than 1 week at a time.

## 2023-03-07 NOTE — Assessment & Plan Note (Signed)
Better with pantoprazole. Continue lifestyle and dietary modifications. Continue famotidine 20mg  twice a day. Continue pantoprazole 40mg  one a day in the morning with nothing to eat or drink for 30 min afterwards. If worsens then will recommend GI evaluation next.

## 2023-03-07 NOTE — Assessment & Plan Note (Signed)
Past history - Diagnosed with asthma over 19 years ago. Patient moved in with her parents who have cats and dogs at home during this time. Did not tolerate Trelegy, Breztri no as effective. Flares in the spring and fall.  Interim history - felt better after taking prednisone but then started with a dry cough again.  Stop Fasenra. Will start Tezspire injections every 4 weeks - next week. Daily controller medication(s):  Continue Breo 285mcg 1 puff once a day and rinse mouth after each use.  Continue Spiriva 1.93mcg 2 puff once a day.  Continue Singulair 10mg  daily During respiratory infections/flares:  Start budesonide (Pulmicort) 0.5mg  nebulizer for 1-2 weeks until your breathing symptoms return to baseline.  Pretreat with albuterol 2 puffs or albuterol nebulizer.  If you need to use your albuterol nebulizer machine back to back within 15-30 minutes with no relief then please go to the ER/urgent care for further evaluation.  May use albuterol rescue inhaler 2 puffs or nebulizer every 4 to 6 hours as needed for shortness of breath, chest tightness, coughing, and wheezing. May use albuterol rescue inhaler 2 puffs 5 to 15 minutes prior to strenuous physical activities. Monitor frequency of use.  Get spirometry at next visit.

## 2023-03-07 NOTE — Patient Instructions (Addendum)
Moderate persistent asthma Stop Fasenra. Will start Tezspire injections every 4 weeks.  Daily controller medication(s):  Continue Breo 233mcg 1 puff once a day and rinse mouth after each use.  Continue Spiriva 1.5mcg 2 puff once a day.  Continue Singulair 10mg  daily During respiratory infections/flares:  Start budesonide (Pulmicort) 0.5mg  nebulizer for 1-2 weeks until your breathing symptoms return to baseline.  Pretreat with albuterol 2 puffs or albuterol nebulizer.  If you need to use your albuterol nebulizer machine back to back within 15-30 minutes with no relief then please go to the ER/urgent care for further evaluation.  May use albuterol rescue inhaler 2 puffs or nebulizer every 4 to 6 hours as needed for shortness of breath, chest tightness, coughing, and wheezing. May use albuterol rescue inhaler 2 puffs 5 to 15 minutes prior to strenuous physical activities. Monitor frequency of use.  Asthma control goals:  Full participation in all desired activities (may need albuterol before activity) Albuterol use two times or less a week on average (not counting use with activity) Cough interfering with sleep two times or less a month Oral steroids no more than once a year No hospitalizations    Seasonal and perennial allergic rhinoconjunctivitis 2022 bloodwork positive to dust mites, cat, dog, grass pollen. Borderline to cockroach, mouse and maple/box elder tree pollen.  Continue environmental control measures - especially regarding the cats. Continue Singulair (montelukast) 10mg  daily.  Use over the counter antihistamines such as Zyrtec (cetirizine), Claritin (loratadine), Allegra (fexofenadine), or Xyzal (levocetirizine) daily as needed. May take twice a day during allergy flares. May switch antihistamines every few months. Use Flonase (fluticasone) nasal spray 1 spray per nostril twice a day as needed for nasal congestion.  Nasal saline spray (i.e., Simply Saline) or nasal saline lavage  (i.e., NeilMed) is recommended as needed and prior to medicated nasal sprays. May use refresh eye drops.   Heartburn Continue lifestyle and dietary modifications. Continue famotidine 20mg  twice a day. Continue pantoprazole 40mg  one a day in the morning with nothing to eat or drink for 30 min afterwards.   Other atopic dermatitis Continue proper skin care measures. Use epicerum moisturizer. Use hydrocortisone 1% cream twice a day as needed for mild rash flares - okay to use on the face, neck, groin area. Do not use more than 1 week at a time. The oral prednisone will help with the rash on the neck too - most likely contact irritation from the exfoliation.    Multiple drug allergies Continue to avoid medications on your allergy list.  Consider penicillin skin testing/drug challenge in the future. If interested we can schedule drug challenge to penicillin. You must be off antihistamines for 3-5 days before. Must be in good health and not ill. Plan on being in the office for 2-3 hours and must bring in the drug you want to do the oral challenge for - will send in prescription to pick up a few days before. You must call to schedule an appointment and specify it's for a drug challenge.  More than 90% of patients outgrow their penicillin allergy.    Follow up in 2 months or sooner if needed.

## 2023-03-16 ENCOUNTER — Ambulatory Visit (INDEPENDENT_AMBULATORY_CARE_PROVIDER_SITE_OTHER): Payer: BC Managed Care – PPO

## 2023-03-16 DIAGNOSIS — J4550 Severe persistent asthma, uncomplicated: Secondary | ICD-10-CM | POA: Diagnosis not present

## 2023-03-16 MED ORDER — TEZEPELUMAB-EKKO 210 MG/1.91ML ~~LOC~~ SOSY
210.0000 mg | PREFILLED_SYRINGE | Freq: Once | SUBCUTANEOUS | Status: AC
  Administered 2023-03-16: 210 mg via SUBCUTANEOUS

## 2023-03-16 NOTE — Progress Notes (Signed)
Immunotherapy   Patient Details  Name: Erin Collins MRN: [id number] Date of Birth: April 03, 1976  03/16/2023  Hermina Barters Strebeck started Tezspire injection today, patient brought her injection into the office. Patient waited 30 minutes in the office without any reactions. Following schedule: Tezspire Frequency: every 28 days  Consent signed and patient instructions given.   Isabel Caprice 03/16/2023, 4:35 PM

## 2023-03-22 ENCOUNTER — Other Ambulatory Visit: Payer: Self-pay

## 2023-03-31 ENCOUNTER — Telehealth (HOSPITAL_COMMUNITY): Payer: BC Managed Care – PPO | Admitting: Psychiatry

## 2023-04-10 ENCOUNTER — Telehealth (INDEPENDENT_AMBULATORY_CARE_PROVIDER_SITE_OTHER): Payer: BC Managed Care – PPO | Admitting: Psychiatry

## 2023-04-10 ENCOUNTER — Encounter (HOSPITAL_COMMUNITY): Payer: Self-pay | Admitting: Psychiatry

## 2023-04-10 DIAGNOSIS — F339 Major depressive disorder, recurrent, unspecified: Secondary | ICD-10-CM | POA: Diagnosis not present

## 2023-04-10 DIAGNOSIS — F411 Generalized anxiety disorder: Secondary | ICD-10-CM | POA: Diagnosis not present

## 2023-04-10 DIAGNOSIS — F331 Major depressive disorder, recurrent, moderate: Secondary | ICD-10-CM

## 2023-04-10 NOTE — Progress Notes (Signed)
[facility] Follow up visit  Patient Identification: Erin Collins MRN:  [id number] Date of Evaluation:  04/10/2023 Referral Source: primary care Chief Complaint:   No chief complaint on file. Follow up depression, mood Visit Diagnosis:    ICD-10-CM   1. MDD (major depressive disorder), recurrent episode, moderate  F33.1     2. GAD (generalized anxiety disorder)  F41.1      Virtual Visit via Video Note  I connected with Erin Collins on 04/10/23 at 10:30 AM EDT by a video enabled telemedicine application and verified that I am speaking with the correct person using two identifiers.  Location: Patient: work  Provider: home office   I discussed the limitations of evaluation and management by telemedicine and the availability of in person appointments. The patient expressed understanding and agreed to proceed.     I discussed the assessment and treatment plan with the patient. The patient was provided an opportunity to ask questions and all were answered. The patient agreed with the plan and demonstrated an understanding of the instructions.   The patient was advised to call back or seek an in-person evaluation if the symptoms worsen or if the condition fails to improve as anticipated.  I provided 20 minutes of non-face-to-face time during this encounter.    History of Present Illness: Patient is a 47 years old currently married Caucasian female referred initially by primary care physician to establish care for possibility of depression, anxiety this is her second marriage she has 2 stepdaughters who are teenagers patient works full-time as a Careers information officer   Patient was having depression and stressors related to difficulty dealing with her teenage daughters patient has OCD about cleanliness and it becomes challenging when they are here on that weekend.  This affects her strength the relationship with her current has been.  Last visit started celexa now at , it has helped with  mood and anxiety Still gets edgy or challenges can cause anxiety Daughters are getting some better but still shy away in communication  Husband has bipolar  Aggravating factors; relationship concerns and job stress has been single for 20 years while living in [place] she states they are to keep near to her son till he turns age 72  Modifying factors family, mom, travelling  Duration more then 20 years      Past Psychiatric History: depression, anxiety  Previous Psychotropic Medications: Yes   Substance Abuse History in the last 12 months:  No.  Consequences of Substance Abuse: NA  Past Medical History:  Past Medical History:  Diagnosis Date   Anemia    Angio-edema    Asthma    Basal cell carcinoma ~ 1999   collarbone   Blood clot in vein ~ 2008   "left breast"    Complication of anesthesia    "I have trouble waking up afterwards" (12/18/2017)   Diarrhea 04/01/2019   Eczema    Family history of adverse reaction to anesthesia    "mom has trouble waking up afterwards" (12/18/2017)   Fluttering heart    "comes and goes" (12/18/2017)   GERD (gastroesophageal reflux disease)    Headache    "weekly" (12/18/2017)   History of kidney stones    Recurrent upper respiratory infection (URI)    Seizures 1980 X 1   "bland stare"   Torticollis    Vertigo     Past Surgical History:  Procedure Laterality Date   ANKLE SURGERY Left 2023   [facility]   BASAL CELL CARCINOMA EXCISION Right ~ 1999   "collarbone"   DILATION AND CURETTAGE OF UTERUS  2005   LAPAROSCOPIC ENDOMETRIOSIS FULGURATION  2005   NASAL SEPTOPLASTY W/ TURBINOPLASTY Bilateral 2014   SINOSCOPY     TONSILLECTOMY  1980   TUBAL LIGATION Right 2005   TYMPANOSTOMY TUBE PLACEMENT      Family History:  Family History  Problem Relation Age of Onset   Allergic rhinitis Mother    Asthma Mother     Social History:   Social History   Socioeconomic History   Marital status: Married     Spouse name: Not on file   Number of children: Not on file   Years of education: Not on file   Highest education level: Not on file  Occupational History   Not on file  Tobacco Use   Smoking status: Never    Passive exposure: Never   Smokeless tobacco: Never  Vaping Use   Vaping Use: Never used  Substance and Sexual Activity   Alcohol use: Not Currently    Comment: 12/18/2017 "maybe 1/year on my birthday"  Rarely.   Drug use: No   Sexual activity: Yes    Partners: Male    Birth control/protection: None, OCP  Other Topics Concern   Not on file  Social History Narrative   Caffeine; large soda daily (20oz).    Education : HS some college.   Working:  Editor, commissioning.    Social Determinants of Health   Financial Resource Strain: Not on file  Food Insecurity: No Food Insecurity (07/21/2021)   Hunger Vital Sign    Worried About Running Out of Food in the Last Year: Never true    Ran Out of Food in the Last Year: Never true  Transportation Needs: No Transportation Needs (07/21/2021)   PRAPARE - Administrator, Civil Service (Medical): No    Lack of Transportation (Non-Medical): No  Physical Activity: Not on file  Stress: Not on file  Social Connections: Not on file     Allergies:   Allergies  Allergen Reactions   Amoxicillin Hives   Cefuroxime Axetil Hives   Hydrocodone Itching   Levofloxacin Other (See Comments)    tendonitis   Morphine Itching   Oxycodone-Acetaminophen Itching   Penicillins Hives    Has patient had a PCN reaction causing immediate rash, facial/tongue/throat swelling, SOB or lightheadedness with hypotension: Yes Has patient had a PCN reaction causing severe rash involving mucus membranes or skin necrosis: No Has patient had a PCN reaction that required hospitalization: No Has patient had a PCN reaction occurring within the last 10 years: No If all of the above answers are "NO", then may proceed with Cephalosporin use.    Propoxyphene Itching     Metabolic Disorder Labs: Lab Results  Component Value Date   HGBA1C 5.2 02/08/2023   No results found for: "PROLACTIN" No results found for: "CHOL", "TRIG", "HDL", "CHOLHDL", "VLDL", "LDLCALC" No results found for: "TSH"  Therapeutic Level Labs: No results found for: "LITHIUM" No results found for: "CBMZ" No results found for: "VALPROATE"  Current Medications: Current Outpatient Medications  Medication Sig Dispense Refill   albuterol (PROVENTIL) (2.5 MG/3ML) 0.083% nebulizer solution Take 3 mLs (2.5 mg total) by nebulization every 4 (four) hours as needed for wheezing or shortness of breath (coughing fits). 75 mL 1   albuterol (VENTOLIN HFA) 108 (90 Base) MCG/ACT inhaler PLEASE SEE ATTACHED FOR DETAILED DIRECTIONS 8.5 each 1  benzonatate (TESSALON PERLES) 100 MG capsule Take 1 capsule (100 mg total) by mouth 3 (three) times daily as needed for cough. 20 capsule 3   budesonide (PULMICORT) 0.5 MG/2ML nebulizer solution Take 2 mLs (0.5 mg total) by nebulization in the morning and at bedtime. For 1-2 weeks during flares. 120 mL 2   citalopram (CELEXA) 20 MG tablet TAKE 1 TABLET BY MOUTH EVERY DAY 90 tablet 2   famotidine (PEPCID) 20 MG tablet TAKE 1 TABLET BY MOUTH TWICE A DAY 180 tablet 1   fluticasone furoate-vilanterol (BREO ELLIPTA) 200-25 MCG/ACT AEPB Inhale 1 puff into the lungs daily. Rinse mouth after each use. 60 each 5   montelukast (SINGULAIR) 10 MG tablet TAKE 1 TABLET BY MOUTH EVERYDAY AT BEDTIME 90 tablet 1   pantoprazole (PROTONIX) 40 MG tablet Take 1 tablet (40 mg total) by mouth in the morning. 30 tablet 2   Spacer/Aero-Holding Chambers (EQ SPACE CHAMBER ANTI-STATIC) DEVI Inhale into the lungs as directed.     TEZSPIRE 210 MG/1. SOAJ Inject into the skin.     Tiotropium Bromide Monohydrate (SPIRIVA RESPIMAT) 1.25 MCG/ACT AERS Inhale 2 puffs into the lungs daily. 4 g 5   No current facility-administered medications for this visit.     Psychiatric Specialty  Exam: Review of Systems  Cardiovascular:  Negative for chest pain.  Neurological:  Negative for tremors.    There were no vitals taken for this visit.There is no height or weight on file to calculate BMI.  General Appearance: Casual  Eye Contact:  Fair  Speech:  Normal Rate  Volume:  Decreased  Mood:  fair  Affect:  Depressed but some better  Thought Process:  Goal Directed  Orientation:  Full (Time, Place, and Person)  Thought Content:  Logical  Suicidal Thoughts:  No  Homicidal Thoughts:  No  Memory:  Immediate;   Fair  Judgement:  Fair  Insight:  Fair  Psychomotor Activity:  Decreased  Concentration:  Concentration: Fair  Recall:  Good  Fund of Knowledge:Good  Language: Fair  Akathisia:  No  Handed:   AIMS (if indicated):  not done  Assets:  Desire for Improvement Financial Resources/Insurance Housing  ADL's:  Intact  Cognition: WNL  Sleep:  Fair   Screenings: GAD-7    Flowsheet Row Office Visit from 07/21/2021 in [facility] Office Visit from 04/22/2020 in [facility]  Total GAD-7 Score 6 3      PHQ2-9    Flowsheet Row Office Visit from 03/04/2023 in [facility] Office Visit from 01[facility]25/2023 in [facility] Office Visit from 10/25/2022 in [facility] Office Visit from 07[facility]25/2022 in [facility] Office Visit from 04/18/2022 in [facility]  PHQ-2 Total Score 3 3 0 2 2  PHQ-9 Total Score 12 15 -- 14 12      Flowsheet Row Office Visit from 03/04/2023 in [facility] ED from 12/16/2022 in [facility] Emergency Department at [facility] ED from 09/05/2021 in [facility] Emergency Department at [facility]  C-SSRS RISK CATEGORY No Risk No Risk No Risk       Assessment and Plan: as  follows  Prior documentation reviewed  Major depressive disorder recurrent mild to moderate; some better but feels subdued again, increase celexa to  has refills  Scheduled for therpay I  encouraged to keep that   Generalized anxiety disorder; gets overwhelemed, increase celexa to 20mg  Starting therapy   Relationship concerns some better but can be challenging, start therapy and working on coping skills   Reviewed questions and medications questions were addressed Fu 21m.  Collaboration of Care: Primary Care Provider AEB chart and notes reviewed  Patient/Guardian was advised Release of Information must be obtained prior to any record release in order to collaborate their care with an outside provider. Patient/Guardian was advised if they have not already done so to contact the registration department to sign all necessary forms in order for [place] to release information regarding their care.   Consent: Patient/Guardian gives verbal consent for treatment and assignment of benefits for services provided during this visit. Patient/Guardian expressed understanding and agreed to proceed.   Thresa Ross, MD 4/22/202410:47 AM

## 2023-04-11 ENCOUNTER — Ambulatory Visit (INDEPENDENT_AMBULATORY_CARE_PROVIDER_SITE_OTHER): Payer: BC Managed Care – PPO | Admitting: Licensed Clinical Social Worker

## 2023-04-11 ENCOUNTER — Other Ambulatory Visit: Payer: Self-pay | Admitting: Allergy

## 2023-04-11 DIAGNOSIS — F411 Generalized anxiety disorder: Secondary | ICD-10-CM

## 2023-04-11 DIAGNOSIS — F331 Major depressive disorder, recurrent, moderate: Secondary | ICD-10-CM | POA: Diagnosis not present
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
## 2023-04-13 ENCOUNTER — Ambulatory Visit (INDEPENDENT_AMBULATORY_CARE_PROVIDER_SITE_OTHER): Payer: BC Managed Care – PPO

## 2023-04-13 DIAGNOSIS — J4550 Severe persistent asthma, uncomplicated: Secondary | ICD-10-CM | POA: Diagnosis not present

## 2023-04-18 MED ORDER — TEZEPELUMAB-EKKO 210 MG/1.91ML ~~LOC~~ SOSY
210.0000 mg | PREFILLED_SYRINGE | Freq: Once | SUBCUTANEOUS | Status: AC
  Administered 2023-04-13: 210 mg via SUBCUTANEOUS

## 2023-04-18 NOTE — Addendum Note (Signed)
Addended by: Devoria Glassing on: 04/18/2023 09:45 AM   Modules accepted: Orders

## 2023-05-04 ENCOUNTER — Ambulatory Visit (INDEPENDENT_AMBULATORY_CARE_PROVIDER_SITE_OTHER): Payer: BC Managed Care – PPO | Admitting: Licensed Clinical Social Worker

## 2023-05-04 ENCOUNTER — Encounter (HOSPITAL_COMMUNITY): Payer: Self-pay

## 2023-05-04 DIAGNOSIS — F411 Generalized anxiety disorder: Secondary | ICD-10-CM

## 2023-05-04 DIAGNOSIS — F331 Major depressive disorder, recurrent, moderate: Secondary | ICD-10-CM | POA: Diagnosis not present

## 2023-05-04 NOTE — Progress Notes (Signed)
Virtual Visit via Video Note  I connected with Erin Collins on 05/04/23 at  8:00 AM EDT by a video enabled telemedicine application and verified that I am speaking with the correct person using two identifiers.  Location: Patient: hotel room Provider: home office   I discussed the limitations of evaluation and management by telemedicine and the availability of in person appointments. The patient expressed understanding and agreed to proceed.   I discussed the assessment and treatment plan with the patient. The patient was provided an opportunity to ask questions and all were answered. The patient agreed with the plan and demonstrated an understanding of the instructions.   The patient was advised to call back or seek an in-person evaluation if the symptoms worsen or if the condition fails to improve as anticipated.  I provided 52 minutes of non-face-to-face time during this encounter.  THERAPIST PROGRESS NOTE  Session Time: 8:00 AM to 8:52 AM  Participation Level: Active  Behavioral Response: CasualAlertEuthymic  Type of Therapy: Individual Therapy  Treatment Goals addressed: work on anxiety including overthinking, skills to calm herself down, not be avoidant when there is conflict, depression, coping  ProgressTowards Goals: Initial  Interventions: Solution Focused, Strength-based, Supportive, and Other: coping  Summary: Erin Collins is a 47 y.o. female who presents with doesn't think Celexa is working agitated face flush for past three days. Feel rushed everybody gets on her nerves. Things at home if not done a specific way or not done get agitated at herself or anybody else.  Discussed some coping strategies to slow herself down but since the medication issue called the office relay information to doctor who will direct her what to do may have to come for a sooner appointment.  In session needed to review more of assessment which was helpful to get more detailed information  focused on what we need to address in therapy.  That includes dynamics of the family working on that.  Completed treatment plan that was helpful to review symptoms that she is an over thinker can get jittery nauseous, also can be avoidant terms of relationship shuts down when she is arguing.  Does not want to go off on somebody has a temper would like to work on that as well.  Still to complete our depression questionnaire pain and nutrition assessment will begin psychoeducation for anxiety.  Therapist provided space and support for patient to talk about thoughts and feelings in session  Suicidal/Homicidal: No  lan: Return again in 2 weeks.2.Finish questions, CBT-anxiety  Diagnosis: Major Depressive Disorder, moderate, recurrent, Generalized Anxiety Disorder  Collaboration of Care: Other none needed  Patient/Guardian was advised Release of Information must be obtained prior to any record release in order to collaborate their care with an outside provider. Patient/Guardian was advised if they have not already done so to contact the registration department to sign all necessary forms in order for [place] to release information regarding their care.   Consent: Patient/Guardian gives verbal consent for treatment and assignment of benefits for services provided during this visit. Patient/Guardian expressed understanding and agreed to proceed.   Coolidge Breeze, LCSW 05/04/2023

## 2023-05-08 NOTE — Progress Notes (Unsigned)
Follow Up Note  RE: Erin Collins MRN: [id number] DOB: July 20, 1976 Date of Office Visit: 05/09/2023  Referring provider: Karoline Caldwell, MD Primary care provider: Karoline Caldwell, MD  Chief Complaint: No chief complaint on file.  History of Present Illness: I had the pleasure of seeing Erin Collins for a follow up visit at the [facility] on 05/08/2023. She is a 47 y.o. female, who is being followed for asthma on Tezspire, allergic rhinoconjunctivitis, GERD, atopic dermatitis, multiple drug allergies. Her previous allergy office visit was on 03/07/2023 with Dr. Selena Batten. Today is a regular follow up visit.  Severe persistent asthma Past history - Diagnosed with asthma over 19 years ago. Patient moved in with her parents who have cats and dogs at home during this time. Did not tolerate Trelegy, Breztri no as effective. Flares in the spring and fall.  Interim history - felt better after taking prednisone but then started with a dry cough again.  Stop Fasenra. Will start Tezspire injections every 4 weeks - next week. Daily controller medication(s):  Continue Breo 1 puff once a day and rinse mouth after each use.  Continue Spiriva 1.26mcg 2 puff once a day.  Continue Singulair 10mg  daily During respiratory infections/flares:  Start budesonide (Pulmicort) 0.5mg  nebulizer for 1-2 weeks until your breathing symptoms return to baseline.  Pretreat with albuterol 2 puffs or albuterol nebulizer.  If you need to use your albuterol nebulizer machine back to back within 15-30 minutes with no relief then please go to the ER/urgent care for further evaluation.  May use albuterol rescue inhaler 2 puffs or nebulizer every 4 to 6 hours as needed for shortness of breath, chest tightness, coughing, and wheezing. May use albuterol rescue inhaler 2 puffs 5 to 15 minutes prior to strenuous physical activities. Monitor frequency of use.  Get spirometry at next visit.   Seasonal and perennial  allergic rhinoconjunctivitis Past history - Perennial rhinoconjunctivitis symptoms for the past 20 years with worsening in the spring and fall.  Allergy immunotherapy for 10 years with good benefit. 2019 skin testing showed, positive to dust mites, cat, dog, grass and horse. 2 cats at home. 2022 bloodwork positive to dust mites, cat, dog, grass pollen. Borderline to cockroach, mouse and maple/box elder tree pollen.  Interim history - Atrovent burns her sinuses. Continue environmental control measures - especially regarding the cats. Continue Singulair (montelukast) 10mg  daily.  Use over the counter antihistamines such as Zyrtec (cetirizine), Claritin (loratadine), Allegra (fexofenadine), or Xyzal (levocetirizine) daily as needed. May take twice a day during allergy flares. May switch antihistamines every few months. Use Flonase (fluticasone) nasal spray 1 spray per nostril twice a day as needed for nasal congestion.  Nasal saline spray (i.e., Simply Saline) or nasal saline lavage (i.e., NeilMed) is recommended as needed and prior to medicated nasal sprays. May use refresh eye drops.  Need asthma to be more stable before starting AIT.   Gastroesophageal reflux disease Better with pantoprazole. Continue lifestyle and dietary modifications. Continue famotidine 20mg  twice a day. Continue pantoprazole 40mg  one a day in the morning with nothing to eat or drink for 30 min afterwards. If worsens then will recommend GI evaluation next.   Other atopic dermatitis Past history - Eucrisa not effective.  Continue proper skin care measures. Use epicerum moisturizer. Use hydrocortisone 1% cream twice a day as needed for mild rash flares - okay to use on the face, neck, groin area. Do not use more than 1 week at  a time.   Multiple drug allergies Past history - reactions to amoxicillin, cefuroxime, hydrocodone, levofloxacin, morphine, oxycodone, and propoxyphene in the past.  Continue to avoid all these  medications.  Consider penicillin skin testing/drug challenge in the future. More than 90% of patients outgrow their penicillin allergy.   Assessment and Plan: Erin Collins is a 47 y.o. female with: No problem-specific Assessment & Plan notes found for this encounter.  No follow-ups on file.  No orders of the defined types were placed in this encounter.  Lab Orders  No laboratory test(s) ordered today    Diagnostics: Spirometry:  Tracings reviewed. Her effort: {Blank single:19197::"Good reproducible efforts.","It was hard to get consistent efforts and there is a question as to whether this reflects a maximal maneuver.","Poor effort, data can not be interpreted."} FVC: ***L FEV1: ***L, ***% predicted FEV1/FVC ratio: ***% Interpretation: {Blank single:19197::"Spirometry consistent with mild obstructive disease","Spirometry consistent with moderate obstructive disease","Spirometry consistent with severe obstructive disease","Spirometry consistent with possible restrictive disease","Spirometry consistent with mixed obstructive and restrictive disease","Spirometry uninterpretable due to technique","Spirometry consistent with normal pattern","No overt abnormalities noted given today's efforts"}.  Please see scanned spirometry results for details.  Skin Testing: {Blank single:19197::"Select foods","Environmental allergy panel","Environmental allergy panel and select foods","Food allergy panel","None","Deferred due to recent antihistamines use"}. *** Results discussed with patient/family.   Medication List:  Current Outpatient Medications  Medication Sig Dispense Refill   albuterol (PROVENTIL) (2.5 MG/3ML) 0.083% nebulizer solution Take 3 mLs (2.5 mg total) by nebulization every 4 (four) hours as needed for wheezing or shortness of breath (coughing fits). 75 mL 1   albuterol (VENTOLIN HFA) 108 (90 Base) MCG/ACT inhaler PLEASE SEE ATTACHED FOR DETAILED DIRECTIONS 8.5 each 1   benzonatate (TESSALON  PERLES) 100 MG capsule Take 1 capsule (100 mg total) by mouth 3 (three) times daily as needed for cough. 20 capsule 3   budesonide (PULMICORT) 0.5 MG/2ML nebulizer solution Take 2 mLs (0.5 mg total) by nebulization in the morning and at bedtime. For 1-2 weeks during flares. 120 mL 2   citalopram (CELEXA) 20 MG tablet TAKE 1 TABLET BY MOUTH EVERY DAY 90 tablet 2   famotidine (PEPCID) 20 MG tablet TAKE 1 TABLET BY MOUTH TWICE A DAY 180 tablet 1   fluticasone furoate-vilanterol (BREO ELLIPTA) 200-25 MCG/ACT AEPB Inhale 1 puff into the lungs daily. Rinse mouth after each use. 60 each 5   montelukast (SINGULAIR) 10 MG tablet TAKE 1 TABLET BY MOUTH EVERYDAY AT BEDTIME 90 tablet 1   pantoprazole (PROTONIX) 40 MG tablet Take 1 tablet (40 mg total) by mouth in the morning. 30 tablet 2   Spacer/Aero-Holding Chambers (EQ SPACE CHAMBER ANTI-STATIC) DEVI Inhale into the lungs as directed.     TEZSPIRE 210 MG/1. SOAJ Inject into the skin.     Tiotropium Bromide Monohydrate (SPIRIVA RESPIMAT) 1.25 MCG/ACT AERS Inhale 2 puffs into the lungs daily. 4 g 5   No current facility-administered medications for this visit.   Allergies: Allergies  Allergen Reactions   Amoxicillin Hives   Cefuroxime Axetil Hives   Hydrocodone Itching   Levofloxacin Other (See Comments)    tendonitis   Morphine Itching   Oxycodone-Acetaminophen Itching   Penicillins Hives    Has patient had a PCN reaction causing immediate rash, facial/tongue/throat swelling, SOB or lightheadedness with hypotension: Yes Has patient had a PCN reaction causing severe rash involving mucus membranes or skin necrosis: No Has patient had a PCN reaction that required hospitalization: No Has patient had a PCN reaction occurring within the last  10 years: No If all of the above answers are "NO", then may proceed with Cephalosporin use.    Propoxyphene Itching   I reviewed her past medical history, social history, family history, and environmental  history and no significant changes have been reported from her previous visit.  Review of Systems  Constitutional:  Negative for appetite change, chills, fever and unexpected weight change.  HENT:  Negative for congestion and rhinorrhea.   Eyes:  Negative for itching.  Respiratory:  Positive for cough. Negative for chest tightness, shortness of breath and wheezing.   Gastrointestinal:  Negative for abdominal pain.  Skin:  Negative for rash.  Allergic/Immunologic: Positive for environmental allergies. Negative for food allergies.  Neurological:  Negative for headaches.    Objective: There were no vitals taken for this visit. There is no height or weight on file to calculate BMI. Physical Exam Vitals and nursing note reviewed.  Constitutional:      Appearance: Normal appearance. She is well-developed.  HENT:     Head: Normocephalic and atraumatic.     Right Ear: Tympanic membrane and external ear normal.     Left Ear: Tympanic membrane and external ear normal.     Nose: Nose normal.     Mouth/Throat:     Mouth: Mucous membranes are moist.     Pharynx: Oropharynx is clear.  Eyes:     Conjunctiva/sclera: Conjunctivae normal.  Cardiovascular:     Rate and Rhythm: Normal rate and regular rhythm.     Heart sounds: Normal heart sounds. No murmur heard. Pulmonary:     Effort: Pulmonary effort is normal.     Breath sounds: Normal breath sounds. No wheezing, rhonchi or rales.  Musculoskeletal:     Cervical back: Neck supple.  Skin:    General: Skin is warm.     Findings: No rash.  Neurological:     Mental Status: She is alert and oriented to person, place, and time.  Psychiatric:        Behavior: Behavior normal.    Previous notes and tests were reviewed. The plan was reviewed with the patient/family, and all questions/concerned were addressed.  It was my pleasure to see Lilliam today and participate in her care. Please feel free to contact me with any questions or  concerns.  Sincerely,  Wyline Mood, DO Allergy & Immunology  [facility] office: [phone] [facility] office: [phone]

## 2023-05-09 ENCOUNTER — Ambulatory Visit: Payer: BC Managed Care – PPO | Admitting: Allergy

## 2023-05-09 ENCOUNTER — Encounter: Payer: Self-pay | Admitting: Allergy

## 2023-05-09 ENCOUNTER — Telehealth: Payer: Self-pay

## 2023-05-09 ENCOUNTER — Other Ambulatory Visit: Payer: Self-pay

## 2023-05-09 VITALS — BP 110/74 | HR 93 | Temp 98.2°F | Resp 20

## 2023-05-09 DIAGNOSIS — J3089 Other allergic rhinitis: Secondary | ICD-10-CM

## 2023-05-09 DIAGNOSIS — Z889 Allergy status to unspecified drugs, medicaments and biological substances status: Secondary | ICD-10-CM

## 2023-05-09 DIAGNOSIS — J4550 Severe persistent asthma, uncomplicated: Secondary | ICD-10-CM

## 2023-05-09 DIAGNOSIS — K219 Gastro-esophageal reflux disease without esophagitis: Secondary | ICD-10-CM

## 2023-05-09 DIAGNOSIS — H1013 Acute atopic conjunctivitis, bilateral: Secondary | ICD-10-CM

## 2023-05-09 DIAGNOSIS — J302 Other seasonal allergic rhinitis: Secondary | ICD-10-CM | POA: Diagnosis not present

## 2023-05-09 DIAGNOSIS — L2089 Other atopic dermatitis: Secondary | ICD-10-CM

## 2023-05-09 NOTE — Assessment & Plan Note (Signed)
Past history - Perennial rhinoconjunctivitis symptoms for the past 20 years with worsening in the spring and fall.  Allergy immunotherapy for 10 years with good benefit. 2019 skin testing showed, positive to dust mites, cat, dog, grass and horse. 2 cats at home. 2022 bloodwork positive to dust mites, cat, dog, grass pollen. Borderline to cockroach, mouse and maple/box elder tree pollen. Atrovent burns sinuses.  Interim history - some throat clearing.  Continue environmental control measures - especially regarding the cats. Continue Singulair (montelukast) 10mg  daily.  Use over the counter antihistamines such as Zyrtec (cetirizine), Claritin (loratadine), Allegra (fexofenadine), or Xyzal (levocetirizine) daily as needed. May take twice a day during allergy flares. May switch antihistamines every few months. Use Flonase (fluticasone) nasal spray 1 spray per nostril twice a day as needed for nasal congestion.  Nasal saline spray (i.e., Simply Saline) or nasal saline lavage (i.e., NeilMed) is recommended as needed and prior to medicated nasal sprays. May use refresh eye drops.  Refer to ENT for throat clearing.

## 2023-05-09 NOTE — Assessment & Plan Note (Signed)
Past history - reactions to amoxicillin, cefuroxime, hydrocodone, levofloxacin, morphine, oxycodone, and propoxyphene in the past.  Continue to avoid all these medications.  Consider penicillin skin testing/drug challenge in the future. More than 90% of patients outgrow their penicillin allergy.  

## 2023-05-09 NOTE — Assessment & Plan Note (Signed)
Past history - Diagnosed with asthma over 19 years ago. Patient moved in with her parents who have cats and dogs at home during this time. Did not tolerate Trelegy, Breztri no as effective. Flares in the spring and fall.  Interim history - feeling better with Tezspire and stopped Breo and Spiriva with no worsening symptoms. Still has some throat clearing.  Today's spirometry showed some mild restriction.  Continue Tezspire injections every 4 weeks.  Daily controller medication(s):  Restart Breo 1 puff once a day and rinse mouth after each use.  After she finished her dose then will decrease to 100mg  dose.  Stop Spiriva.  Continue Singulair 10mg  daily During respiratory infections/flares:  Start budesonide (Pulmicort) 0.5mg  nebulizer for 1-2 weeks until your breathing symptoms return to baseline.  Pretreat with albuterol 2 puffs or albuterol nebulizer.  If you need to use your albuterol nebulizer machine back to back within 15-30 minutes with no relief then please go to the ER/urgent care for further evaluation.  May use albuterol rescue inhaler 2 puffs or nebulizer every 4 to 6 hours as needed for shortness of breath, chest tightness, coughing, and wheezing. May use albuterol rescue inhaler 2 puffs 5 to 15 minutes prior to strenuous physical activities. Monitor frequency of use.  Get spirometry at next visit.

## 2023-05-09 NOTE — Assessment & Plan Note (Signed)
Past history - Eucrisa not effective.  Continue proper skin care measures. Use epicerum moisturizer. Use hydrocortisone 1% cream twice a day as needed for mild rash flares - okay to use on the face, neck, groin area. Do not use more than 1 week at a time. 

## 2023-05-09 NOTE — Telephone Encounter (Signed)
Referral to ENT per Dr Selena Batten for past sinus surgery and throat clearing thank you

## 2023-05-09 NOTE — Assessment & Plan Note (Signed)
Controlled. Continue lifestyle and dietary modifications. Continue famotidine 20mg  twice a day. Continue pantoprazole 40mg  one a day in the morning with nothing to eat or drink for 30 min afterwards. Consider GI evaluation - EGD?

## 2023-05-09 NOTE — Patient Instructions (Addendum)
Moderate persistent asthma Continue Tezspire injections every 4 weeks.  Daily controller medication(s):  Restart Breo 1 puff once a day and rinse mouth after each use.  Let me know when to send in the Breo . Stop Spiriva.  Continue Singulair 10mg  daily During respiratory infections/flares:  Start budesonide (Pulmicort) 0.5mg  nebulizer for 1-2 weeks until your breathing symptoms return to baseline.  Pretreat with albuterol 2 puffs or albuterol nebulizer.  If you need to use your albuterol nebulizer machine back to back within 15-30 minutes with no relief then please go to the ER/urgent care for further evaluation.  May use albuterol rescue inhaler 2 puffs or nebulizer every 4 to 6 hours as needed for shortness of breath, chest tightness, coughing, and wheezing. May use albuterol rescue inhaler 2 puffs 5 to 15 minutes prior to strenuous physical activities. Monitor frequency of use.  Asthma control goals:  Full participation in all desired activities (may need albuterol before activity) Albuterol use two times or less a week on average (not counting use with activity) Cough interfering with sleep two times or less a month Oral steroids no more than once a year No hospitalizations    Seasonal and perennial allergic rhinoconjunctivitis 2022 bloodwork positive to dust mites, cat, dog, grass pollen. Borderline to cockroach, mouse and maple/box elder tree pollen.  Continue environmental control measures - especially regarding the cats. Continue Singulair (montelukast) 10mg  daily.  Use over the counter antihistamines such as Zyrtec (cetirizine), Claritin (loratadine), Allegra (fexofenadine), or Xyzal (levocetirizine) daily as needed. May take twice a day during allergy flares. May switch antihistamines every few months. Use Flonase (fluticasone) nasal spray 1 spray per nostril twice a day as needed for nasal congestion.  Nasal saline spray (i.e., Simply Saline) or nasal saline lavage  (i.e., NeilMed) is recommended as needed and prior to medicated nasal sprays. May use refresh eye drops.   Throat clearing Refer to ENT.   Heartburn Continue lifestyle and dietary modifications. Continue famotidine 20mg  twice a day. Continue pantoprazole 40mg  one a day in the morning with nothing to eat or drink for 30 min afterwards.   Other atopic dermatitis Continue proper skin care measures. Use epicerum moisturizer. Use hydrocortisone 1% cream twice a day as needed for mild rash flares - okay to use on the face, neck, groin area. Do not use more than 1 week at a time. The oral prednisone will help with the rash on the neck too - most likely contact irritation from the exfoliation.    Multiple drug allergies Continue to avoid medications on your allergy list.  Consider penicillin skin testing/drug challenge in the future. If interested we can schedule drug challenge to penicillin. You must be off antihistamines for 3-5 days before. Must be in good health and not ill. Plan on being in the office for 2-3 hours and must bring in the drug you want to do the oral challenge for - will send in prescription to pick up a few days before. You must call to schedule an appointment and specify it's for a drug challenge.  More than 90% of patients outgrow their penicillin allergy.    Follow up in 3 months or sooner if needed.

## 2023-05-11 ENCOUNTER — Ambulatory Visit (INDEPENDENT_AMBULATORY_CARE_PROVIDER_SITE_OTHER): Payer: BC Managed Care – PPO

## 2023-05-11 DIAGNOSIS — J4550 Severe persistent asthma, uncomplicated: Secondary | ICD-10-CM | POA: Diagnosis not present

## 2023-05-11 MED ORDER — TEZEPELUMAB-EKKO 210 MG/1.91ML ~~LOC~~ SOSY
210.0000 mg | PREFILLED_SYRINGE | SUBCUTANEOUS | Status: AC
  Administered 2023-05-11 – 2023-10-12 (×5): 210 mg via SUBCUTANEOUS

## 2023-05-17 ENCOUNTER — Telehealth (HOSPITAL_COMMUNITY): Payer: Self-pay | Admitting: Psychiatry

## 2023-05-17 ENCOUNTER — Encounter (HOSPITAL_COMMUNITY): Payer: Self-pay

## 2023-05-17 ENCOUNTER — Ambulatory Visit (INDEPENDENT_AMBULATORY_CARE_PROVIDER_SITE_OTHER): Payer: Self-pay | Admitting: Licensed Clinical Social Worker

## 2023-05-17 DIAGNOSIS — F411 Generalized anxiety disorder: Secondary | ICD-10-CM

## 2023-05-17 DIAGNOSIS — F331 Major depressive disorder, recurrent, moderate: Secondary | ICD-10-CM

## 2023-05-17 NOTE — Progress Notes (Signed)
Therapist contacted patient through my Chart and she did not show

## 2023-05-17 NOTE — Telephone Encounter (Signed)
Patient called @ [phone] stating she missed her 0800 appointment with therapist - thought the appointment was at 0900. Also stated she wanted to send psychiatry provider a message regarding medication issue. Stated Celexa "doesn't seem to be doing anything for me." Additionally states she has been more irritable, crying more frequently, and is experiencing tightness in her jaws. Explained that psychiatrist is out of the office this week but message would be sent to him and to covering provider, and she would be contacted if there are recommendations regarding the medication issue.

## 2023-05-25 NOTE — Telephone Encounter (Signed)
Referral has been placed to Dr. Suszanne Conners. Patient has been informed via MyChart.    Thanks

## 2023-05-25 NOTE — Telephone Encounter (Signed)
Referral being changed to [facility] . I totally forgot Dr Suszanne Conners does not deal with throat issues. Sending a new message to the patient.

## 2023-05-30 ENCOUNTER — Encounter (HOSPITAL_COMMUNITY): Payer: Self-pay | Admitting: Psychiatry

## 2023-05-30 ENCOUNTER — Telehealth (INDEPENDENT_AMBULATORY_CARE_PROVIDER_SITE_OTHER): Payer: BC Managed Care – PPO | Admitting: Psychiatry

## 2023-05-30 DIAGNOSIS — F331 Major depressive disorder, recurrent, moderate: Secondary | ICD-10-CM

## 2023-05-30 DIAGNOSIS — F411 Generalized anxiety disorder: Secondary | ICD-10-CM

## 2023-05-30 MED ORDER — BUSPIRONE HCL 7.5 MG PO TABS: 7.5000 mg | ORAL_TABLET | Freq: Every day | ORAL | 0 refills | Status: DC | PRN

## 2023-05-30 MED ORDER — CITALOPRAM HYDROBROMIDE 10 MG PO TABS: 10.0000 mg | ORAL_TABLET | Freq: Every day | ORAL | 0 refills | Status: DC

## 2023-05-30 NOTE — Progress Notes (Signed)
[facility] Follow up visit  Patient Identification: Erin Collins MRN:  [id number] Date of Evaluation:  05/30/2023 Referral Source: primary care Chief Complaint:   No chief complaint on file. Follow up depression, mood Visit Diagnosis:    ICD-10-CM   1. MDD (major depressive disorder), recurrent episode, moderate (HCC)  F33.1     2. GAD (generalized anxiety disorder)  F41.1      Virtual Visit via Video Note  I connected with Jennette Dubin Axtman on 05/30/23 at  1:30 PM EDT by a video enabled telemedicine application and verified that I am speaking with the correct person using two identifiers.  Location: Patient: parked car Provider: office   I discussed the limitations of evaluation and management by telemedicine and the availability of in person appointments. The patient expressed understanding and agreed to proceed.     I discussed the assessment and treatment plan with the patient. The patient was provided an opportunity to ask questions and all were answered. The patient agreed with the plan and demonstrated an understanding of the instructions.   The patient was advised to call back or seek an in-person evaluation if the symptoms worsen or if the condition fails to improve as anticipated.  I provided 20 minutes of non-face-to-face time during this encounter.     History of Present Illness: Patient is a 47 years old currently married Caucasian female referred initially by primary care physician to establish care for possibility of depression, anxiety this is her second marriage she has 2 stepdaughters who are teenagers patient works full-time as a Careers information officer   Patient was having depression and stressors related to difficulty dealing with her teenage daughters patient has OCD about cleanliness and they would not clean their mess  Last visit started celexa, it helped some but at 20mg  she felt numb, so called earlier and got back to 10mg  It does help less edgy but gets anxious,  worriful Still worries related to job with [employer] management  In past zoloft helped but had sexual side effects with it.   Daughters are getting some better but still shy away in communication  Husband has bipolar  Aggravating factors; relationship concerns and job stress has been single for 20 years while living in [place] she states they are to keep near to her son till he turns age 74  Modifying factors family, mom, travelling  Duration more then 20 years      Past Psychiatric History: depression, anxiety  Previous Psychotropic Medications: Yes   Substance Abuse History in the last 12 months:  No.  Consequences of Substance Abuse: NA  Past Medical History:  Past Medical History:  Diagnosis Date   Anemia    Angio-edema    Asthma    Basal cell carcinoma ~ 1999   collarbone   Blood clot in vein ~ 2008   "left breast"    Complication of anesthesia    "I have trouble waking up afterwards" (12/18/2017)   Diarrhea 04/01/2019   Eczema    Family history of adverse reaction to anesthesia    "mom has trouble waking up afterwards" (12/18/2017)   Fluttering heart    "comes and goes" (12/18/2017)   GERD (gastroesophageal reflux disease)    Headache    "weekly" (12/18/2017)   History of kidney stones    Recurrent upper respiratory infection (URI)    Seizures (HCC) 1980 X 1   "bland stare"   Torticollis    Vertigo  Past Surgical History:  Procedure Laterality Date   ANKLE SURGERY Left 2023   [facility]   BASAL CELL CARCINOMA EXCISION Right ~ 1999   "collarbone"   DILATION AND CURETTAGE OF UTERUS  2005   LAPAROSCOPIC ENDOMETRIOSIS FULGURATION  2005   NASAL SEPTOPLASTY W/ TURBINOPLASTY Bilateral 2014   SINOSCOPY     TONSILLECTOMY  1980   TUBAL LIGATION Right 2005   TYMPANOSTOMY TUBE PLACEMENT      Family History:  Family History  Problem Relation Age of Onset   Allergic rhinitis Mother    Asthma Mother     Social History:   Social  History   Socioeconomic History   Marital status: Married    Spouse name: Not on file   Number of children: Not on file   Years of education: Not on file   Highest education level: Not on file  Occupational History   Not on file  Tobacco Use   Smoking status: Never    Passive exposure: Never   Smokeless tobacco: Never  Vaping Use   Vaping Use: Never used  Substance and Sexual Activity   Alcohol use: Not Currently    Comment: 12/18/2017 "maybe 1/year on my birthday"  Rarely.   Drug use: No   Sexual activity: Yes    Partners: Male    Birth control/protection: None, OCP  Other Topics Concern   Not on file  Social History Narrative   Caffeine; large soda daily (20oz).    Education : HS some college.   Working:  Editor, commissioning.    Social Determinants of Health   Financial Resource Strain: Not on file  Food Insecurity: No Food Insecurity (07/21/2021)   Hunger Vital Sign    Worried About Running Out of Food in the Last Year: Never true    Ran Out of Food in the Last Year: Never true  Transportation Needs: No Transportation Needs (07/21/2021)   PRAPARE - Administrator, Civil Service (Medical): No    Lack of Transportation (Non-Medical): No  Physical Activity: Not on file  Stress: Not on file  Social Connections: Not on file     Allergies:   Allergies  Allergen Reactions   Amoxicillin Hives   Cefuroxime Axetil Hives   Hydrocodone Itching   Levofloxacin Other (See Comments)    tendonitis   Morphine Itching   Oxycodone-Acetaminophen Itching   Penicillins Hives    Has patient had a PCN reaction causing immediate rash, facial/tongue/throat swelling, SOB or lightheadedness with hypotension: Yes Has patient had a PCN reaction causing severe rash involving mucus membranes or skin necrosis: No Has patient had a PCN reaction that required hospitalization: No Has patient had a PCN reaction occurring within the last 10 years: No If all of the above answers are "NO",  then may proceed with Cephalosporin use.    Propoxyphene Itching    Metabolic Disorder Labs: Lab Results  Component Value Date   HGBA1C 5.2 02/08/2023   No results found for: "PROLACTIN" No results found for: "CHOL", "TRIG", "HDL", "CHOLHDL", "VLDL", "LDLCALC" No results found for: "TSH"  Therapeutic Level Labs: No results found for: "LITHIUM" No results found for: "CBMZ" No results found for: "VALPROATE"  Current Medications: Current Outpatient Medications  Medication Sig Dispense Refill   busPIRone (BUSPAR) 7.5 MG tablet Take 1 tablet (7.5 mg total) by mouth daily as needed. 30 tablet 0   albuterol (PROVENTIL) (2.5 MG/3ML) 0.083% nebulizer solution Take 3 mLs (2.5 mg total)  by nebulization every 4 (four) hours as needed for wheezing or shortness of breath (coughing fits). 75 mL 1   albuterol (VENTOLIN HFA) 108 (90 Base) MCG/ACT inhaler PLEASE SEE ATTACHED FOR DETAILED DIRECTIONS 8.5 each 1   budesonide (PULMICORT) 0.5 MG/2ML nebulizer solution Take 2 mLs (0.5 mg total) by nebulization in the morning and at bedtime. For 1-2 weeks during flares. 120 mL 2   citalopram (CELEXA) 10 MG tablet Take 1 tablet (10 mg total) by mouth daily. 30 tablet 0   famotidine (PEPCID) 20 MG tablet TAKE 1 TABLET BY MOUTH TWICE A DAY 180 tablet 1   fluticasone furoate-vilanterol (BREO ELLIPTA) 200-25 MCG/ACT AEPB Inhale 1 puff into the lungs daily. Rinse mouth after each use. (Patient not taking: Reported on 05/09/2023) 60 each 5   montelukast (SINGULAIR) 10 MG tablet TAKE 1 TABLET BY MOUTH EVERYDAY AT BEDTIME 90 tablet 1   pantoprazole (PROTONIX) 40 MG tablet Take 1 tablet (40 mg total) by mouth in the morning. 30 tablet 2   phentermine (ADIPEX-P) 37.5 MG tablet      Spacer/Aero-Holding Chambers (EQ SPACE CHAMBER ANTI-STATIC) DEVI Inhale into the lungs as directed.     TEZSPIRE 210 MG/1. SOAJ Inject into the skin.     Current Facility-Administered Medications  Medication Dose Route Frequency  Provider Last Rate Last Admin   tezepelumab-ekko (TEZSPIRE) 210 MG/1. syringe 210 mg  210 mg Subcutaneous Q28 days Ellamae Sia, DO   210 mg at 05/11/23 1614     Psychiatric Specialty Exam: Review of Systems  Cardiovascular:  Negative for chest pain.  Neurological:  Negative for tremors.    There were no vitals taken for this visit.There is no height or weight on file to calculate BMI.  General Appearance: Casual  Eye Contact:  Fair  Speech:  Normal Rate  Volume:  Decreased  Mood:  bets stressed  Affect: congruent  Thought Process:  Goal Directed  Orientation:  Full (Time, Place, and Person)  Thought Content:  Logical  Suicidal Thoughts:  No  Homicidal Thoughts:  No  Memory:  Immediate;   Fair  Judgement:  Fair  Insight:  Fair  Psychomotor Activity:  Decreased  Concentration:  Concentration: Fair  Recall:  Good  Fund of Knowledge:Good  Language: Fair  Akathisia:  No  Handed:   AIMS (if indicated):  not done  Assets:  Desire for Improvement Financial Resources/Insurance Housing  ADL's:  Intact  Cognition: WNL  Sleep:  Fair   Screenings: GAD-7    Flowsheet Row Office Visit from 07/21/2021 in [facility] Office Visit from 04/22/2020 in [facility]  Total GAD-7 Score 6 3      PHQ2-9    Flowsheet Row [facility]20[facility] Office Visit from 01/25/2023 in [facility] Office Visit from 10/25/2022 in [facility] Office Visit from 07/25/2022 in [facility] Office Visit from 04/18/2022 in [facility]  PHQ-2 Total Score 3 3 0 2 2  PHQ-9 Total Score 12 15 -- 14 12      Flowsheet Row Office Visit from 03/04/2023 in [facility] from 12/16/2022 in [facility] Emergency Department  at [facility] ED from 09/05/2021 in [facility] Emergency Department at [facility]  C-SSRS RISK CATEGORY No Risk No Risk No Risk  Assessment and Plan: as follows Prior documentation reviewed   Major depressive disorder recurrent mild to moderate; fair , feels more so anxiety rather depression. Continue celexa 10mg    Scheduled and have started therapy   Generalized anxiety disorder; gets overwhelmed, somewhat hyperverbal at  job, continue celexa 10mg  add buspar for anxiety and agitation  Relationship concerns some better but can be challenging,some better , continue therapy  Reviewed questions and medications questions were addressed Fu 1 - 63m.  Collaboration of Care: Primary Care Provider AEB chart and notes reviewed  Patient/Guardian was advised Release of Information must be obtained prior to any record release in order to collaborate their care with an outside provider. Patient/Guardian was advised if they have not already done so to contact the registration department to sign all necessary forms in order for [place] to release information regarding their care.   Consent: Patient/Guardian gives verbal consent for treatment and assignment of benefits for services provided during this visit. Patient/Guardian expressed understanding and agreed to proceed.   Thresa Ross, MD 6/11/20241:51 PM

## 2023-05-31 ENCOUNTER — Encounter (HOSPITAL_COMMUNITY): Payer: Self-pay

## 2023-05-31 ENCOUNTER — Ambulatory Visit (INDEPENDENT_AMBULATORY_CARE_PROVIDER_SITE_OTHER): Payer: Self-pay | Admitting: Licensed Clinical Social Worker

## 2023-05-31 DIAGNOSIS — F411 Generalized anxiety disorder: Secondary | ICD-10-CM

## 2023-05-31 DIAGNOSIS — F331 Major depressive disorder, recurrent, moderate: Secondary | ICD-10-CM

## 2023-05-31 NOTE — Progress Notes (Signed)
Therapist contacted patient through My Chart and she did not show 

## 2023-06-01 ENCOUNTER — Encounter (HOSPITAL_COMMUNITY): Payer: Self-pay | Admitting: Licensed Clinical Social Worker

## 2023-06-05 ENCOUNTER — Telehealth (HOSPITAL_COMMUNITY): Payer: BC Managed Care – PPO | Admitting: Psychiatry

## 2023-06-08 ENCOUNTER — Ambulatory Visit (INDEPENDENT_AMBULATORY_CARE_PROVIDER_SITE_OTHER): Payer: BC Managed Care – PPO

## 2023-06-08 DIAGNOSIS — J4550 Severe persistent asthma, uncomplicated: Secondary | ICD-10-CM

## 2023-06-14 ENCOUNTER — Ambulatory Visit (HOSPITAL_COMMUNITY): Payer: BC Managed Care – PPO | Admitting: Licensed Clinical Social Worker

## 2023-06-21 ENCOUNTER — Other Ambulatory Visit (HOSPITAL_COMMUNITY): Payer: Self-pay | Admitting: Psychiatry

## 2023-06-28 ENCOUNTER — Telehealth (HOSPITAL_COMMUNITY): Payer: Self-pay | Admitting: Psychiatry

## 2023-06-28 NOTE — Telephone Encounter (Signed)
Need to reschedule 06/29/2023 appointment. Messages requesting return call have not been responded to.

## 2023-06-29 ENCOUNTER — Telehealth (HOSPITAL_COMMUNITY): Payer: BC Managed Care – PPO | Admitting: Psychiatry

## 2023-07-03 ENCOUNTER — Telehealth (HOSPITAL_COMMUNITY): Payer: Self-pay | Admitting: Licensed Clinical Social Worker

## 2023-07-03 ENCOUNTER — Encounter: Payer: Self-pay | Admitting: Student

## 2023-07-03 ENCOUNTER — Ambulatory Visit: Payer: BC Managed Care – PPO | Admitting: Student

## 2023-07-03 VITALS — BP 137/67 | HR 114 | Temp 98.1°F | Wt 179.3 lb

## 2023-07-03 DIAGNOSIS — F320 Major depressive disorder, single episode, mild: Secondary | ICD-10-CM

## 2023-07-03 DIAGNOSIS — F32A Depression, unspecified: Secondary | ICD-10-CM | POA: Diagnosis not present

## 2023-07-03 DIAGNOSIS — R109 Unspecified abdominal pain: Secondary | ICD-10-CM | POA: Diagnosis not present

## 2023-07-03 NOTE — Assessment & Plan Note (Addendum)
Patient presented due to concerns of unexplained abdominal pain that has been going on for the past 10 years, follow-up imaging including HIDA scan has been unrevealing.  Right upper ultrasound to evaluate any gallbladder pathology was was negative about 5 months ago.  Patient expresses concerns for ovarian cancer due to family history, mother died from breast cancer in her late 60s,biological sister just got diagnosed with ovarian cancer 2 months ago.  Patient reported history of endometriosis in 2005(no supporting documentation presented at this time).  Due to extensive malignancy history in immediate family, hx of unexplained abdominal pain and fulness, and a recent diagnosis of ovarian cancer in her biological sister, BRCA and Lynch syndrome and other gene mutation concerns cannot be totally ruled out at this time. Will educate patient on the stepwise approach to Ovarian cancer work up per guidelines. -Order a complete transvaginal and transabdominal ultrasound -Follow-up with the patient when the results comes back -Educate on ongoing [place] genetic testing patient is interested.

## 2023-07-03 NOTE — Patient Instructions (Signed)
Thank you, Ms.Lyly Rayanna Matusik for allowing [place] to provide your care today. Today we discussed your unexplained abdominal pain and fullness and your concern for CA125 testing. -   We will order a complete pelvic US today - We will follow up with you once the result is back - We will follow up with you on [place]'s free genetic testing based on the family history you shared with [place] today.  I have ordered the following labs for you:  Lab Orders  No laboratory test(s) ordered today     Tests ordered today:    Referrals ordered today:   Referral Orders  No referral(s) requested today     I have ordered the following medication/changed the following medications:   Stop the following medications: There are no discontinued medications.   Start the following medications: No orders of the defined types were placed in this encounter.    Follow up:  After [place] is back     Remember:   Should you have any questions or concerns please call the internal medicine clinic at [phone].    Kathleen Lime, M.D [facility]

## 2023-07-03 NOTE — Telephone Encounter (Signed)
Patient called to reschedule psychiatry appointment and requested appointment with therapist. Informed her that she had been dismissed from therapist due to consecutive no-shows. Requested reconsideration of dismissal. Therapist out of office until 7/24 - patient informed of this and that note with request would be sent to therapist. Patient also inquired about marital counseling. Referred to [facility].

## 2023-07-03 NOTE — Assessment & Plan Note (Addendum)
Has a history of major depressive disorder currently following up with psych and behavioral therapist, managed with buspirone 7.5 mg.  Denies any suicidal ideation at this time.  No changes in medical therapy is recommended at this time.GAD-7 of 19 and PHQ-9 of 21.  -Continue buspirone 7.5 mg -Continue following up with therapist as and when needed

## 2023-07-03 NOTE — Progress Notes (Addendum)
CC:  Intermittent unexplained abdominal pain and concerns for the need for CA 125 testing  HPI:  Erin Collins is a 47 y.o. female living with a history stated below and presents today intermittent unexplained abdominal pain and concerns for the need for CA125 testing. Please see problem based assessment and plan for additional details.  Past Medical History:  Diagnosis Date   Anemia    Angio-edema    Asthma    Basal cell carcinoma ~ 1999   collarbone   Blood clot in vein ~ 2008   "left breast"    Complication of anesthesia    "I have trouble waking up afterwards" (12/18/2017)   Diarrhea 04/01/2019   Eczema    Family history of adverse reaction to anesthesia    "mom has trouble waking up afterwards" (12/18/2017)   Fluttering heart    "comes and goes" (12/18/2017)   GERD (gastroesophageal reflux disease)    Headache    "weekly" (12/18/2017)   History of kidney stones    Recurrent upper respiratory infection (URI)    Seizures (HCC) 1980 X 1   "bland stare"   Torticollis    Vertigo     Current Outpatient Medications on File Prior to Visit  Medication Sig Dispense Refill   albuterol (PROVENTIL) (2.5 MG/3ML) 0.083% nebulizer solution Take 3 mLs (2.5 mg total) by nebulization every 4 (four) hours as needed for wheezing or shortness of breath (coughing fits). 75 mL 1   albuterol (VENTOLIN HFA) 108 (90 Base) MCG/ACT inhaler PLEASE SEE ATTACHED FOR DETAILED DIRECTIONS 8.5 each 1   budesonide (PULMICORT) 0.5 MG/2ML nebulizer solution Take 2 mLs (0.5 mg total) by nebulization in the morning and at bedtime. For 1-2 weeks during flares. 120 mL 2   busPIRone (BUSPAR) 7.5 MG tablet TAKE 1 TABLET BY MOUTH DAILY AS NEEDED. 30 tablet 0   citalopram (CELEXA) 10 MG tablet TAKE 1 TABLET BY MOUTH EVERY DAY 90 tablet 1   famotidine (PEPCID) 20 MG tablet TAKE 1 TABLET BY MOUTH TWICE A DAY 180 tablet 1   fluticasone furoate-vilanterol (BREO ELLIPTA) 200-25 MCG/ACT AEPB Inhale 1 puff into the  lungs daily. Rinse mouth after each use. 60 each 5   montelukast (SINGULAIR) 10 MG tablet TAKE 1 TABLET BY MOUTH EVERYDAY AT BEDTIME 90 tablet 1   pantoprazole (PROTONIX) 40 MG tablet Take 1 tablet (40 mg total) by mouth in the morning. 30 tablet 2   phentermine (ADIPEX-P) 37.5 MG tablet      Spacer/Aero-Holding Chambers (EQ SPACE CHAMBER ANTI-STATIC) DEVI Inhale into the lungs as directed.     TEZSPIRE 210 MG/1. SOAJ Inject into the skin.     Current Facility-Administered Medications on File Prior to Visit  Medication Dose Route Frequency Provider Last Rate Last Admin   tezepelumab-ekko (TEZSPIRE) 210 MG/1. syringe 210 mg  210 mg Subcutaneous Q28 days Ellamae Sia, DO   210 mg at 06/08/23 1600    Family History  Problem Relation Age of Onset   Allergic rhinitis Mother    Asthma Mother     Social History   Socioeconomic History   Marital status: Married    Spouse name: Not on file   Number of children: Not on file   Years of education: Not on file   Highest education level: Not on file  Occupational History   Not on file  Tobacco Use   Smoking status: Never    Passive exposure: Never   Smokeless tobacco: Never  Vaping  Use   Vaping status: Never Used  Substance and Sexual Activity   Alcohol use: Not Currently    Comment: 12/18/2017 "maybe 1/year on my birthday"  Rarely.   Drug use: No   Sexual activity: Yes    Partners: Male    Birth control/protection: None, OCP  Other Topics Concern   Not on file  Social History Narrative   Caffeine; large soda daily (20oz).    Education : HS some college.   Working:  Editor, commissioning.    Social Determinants of Health   Financial Resource Strain: Not on file  Food Insecurity: No Food Insecurity (07/21/2021)   Hunger Vital Sign    Worried About Running Out of Food in the Last Year: Never true    Ran Out of Food in the Last Year: Never true  Transportation Needs: No Transportation Needs (07/21/2021)   PRAPARE - Therapist, art (Medical): No    Lack of Transportation (Non-Medical): No  Physical Activity: Not on file  Stress: Not on file  Social Connections: Not on file  Intimate Partner Violence: Not on file    Review of Systems: ROS negative except for what is noted on the assessment and plan.  Vitals:   07/03/23 0908  BP: 137/67  Pulse: (!) 114  Temp: 98.1 F (36.7 C)  TempSrc: Oral  SpO2: 100%  Weight: 179 lb 4.8 oz (81.3 kg)    Physical Exam: Constitutional: Teary woman sitting comfortably mildly anxious  Cardiovascular: No abdominal heart sounds appreciated normal S1-S2  Abdominal: No signs of bloating nondistended no guarding no rebound tenderness , no abdominal masses appreciated Skin: warm and dry Psych: Bulky speech, goal-oriented, mildly anxious  Assessment & Plan:   Abdominal pain of unknown cause Patient presented due to concerns of unexplained abdominal pain that has been going on for the past 10 years, follow-up imaging including HIDA scan has been unrevealing.  Right upper ultrasound to evaluate any gallbladder pathology was was negative about 5 months ago.  Patient expresses concerns for ovarian cancer due to family history, mother died from breast cancer in her late 60s,biological sister just got diagnosed with ovarian cancer 2 months ago.  Patient reported history of endometriosis in 2005(no supporting documentation presented at this time).  Due to extensive malignancy history in immediate family, hx of unexplained abdominal pain and fulness, and a recent diagnosis of ovarian cancer in her biological sister, BRCA and Lynch syndrome and other gene mutation concerns cannot be totally ruled out at this time. Will educate patient on the stepwise approach to Ovarian cancer work up per guidelines. -Order a complete transvaginal and transabdominal ultrasound -Follow-up with the patient when the results comes back -Educate on ongoing [place] genetic testing patient is  interested.   Depression Has a history of major depressive disorder currently following up with psych and behavioral therapist, managed with buspirone 7.5 mg.  Denies any suicidal ideation at this time.  No changes in medical therapy is recommended at this time.GAD-7 of 19 and PHQ-9 of 21.  -Continue buspirone 7.5 mg -Continue following up with therapist as and when needed   Patient seen with Dr. Dana Allan, M.D [facility] Phone: [phone] Date 07/03/2023 Time 1:03 PM

## 2023-07-04 ENCOUNTER — Encounter (HOSPITAL_COMMUNITY): Payer: Self-pay | Admitting: Psychiatry

## 2023-07-04 ENCOUNTER — Telehealth (HOSPITAL_COMMUNITY): Payer: BC Managed Care – PPO | Admitting: Psychiatry

## 2023-07-04 DIAGNOSIS — F411 Generalized anxiety disorder: Secondary | ICD-10-CM

## 2023-07-04 DIAGNOSIS — F331 Major depressive disorder, recurrent, moderate: Secondary | ICD-10-CM

## 2023-07-04 MED ORDER — DULOXETINE HCL 30 MG PO CPEP
30.0000 mg | ORAL_CAPSULE | Freq: Every day | ORAL | 0 refills | Status: DC
Start: 2023-07-04 — End: 2023-07-30

## 2023-07-04 NOTE — Progress Notes (Signed)
[facility] Follow up visit  Patient Identification: Erin Collins MRN:  [id number] Date of Evaluation:  07/04/2023 Referral Source: primary care Chief Complaint:   No chief complaint on file. Follow up depression, mood Visit Diagnosis:    ICD-10-CM   1. MDD (major depressive disorder), recurrent episode, moderate (HCC)  F33.1     2. GAD (generalized anxiety disorder)  F41.1      Virtual Visit via Video Note  I connected with Erin Collins on 07/04/23 at  3:00 PM EDT by a video enabled telemedicine application and verified that I am speaking with the correct person using two identifiers.  Location: Patient: home Provider: office   I discussed the limitations of evaluation and management by telemedicine and the availability of in person appointments. The patient expressed understanding and agreed to proceed.      I discussed the assessment and treatment plan with the patient. The patient was provided an opportunity to ask questions and all were answered. The patient agreed with the plan and demonstrated an understanding of the instructions.   The patient was advised to call back or seek an in-person evaluation if the symptoms worsen or if the condition fails to improve as anticipated.  I provided 20 minutes of  virtual face-to-face time during this encounter.      History of Present Illness: Patient is a 47 years old currently married Caucasian female referred initially by primary care physician to establish care for possibility of depression, anxiety this is her second marriage she has 2 stepdaughters who are teenagers patient works full-time as a Careers information officer   Patient was having depression and stressors related to difficulty dealing with her teenage daughters patient has OCD about cleanliness and they would not clean their mess Last visit started celexa and buspar, says it didn't help somewhat more agitated at times so stopped   In past zoloft helped but had sexual side  effects with it.   Daughters are getting some better but still shy away in communication  Husband has bipolar difficult dealing with him or leads to argument from small talks   Sister recently diagnsed with ovarian cancer so stressed out due to that   Aggravating factors; relationship concerns and job stress has been single for 20 years while living in [place] she states they are to keep near to her son till he turns age 72, sisters illness  Modifying factors family, mom, travelling  Duration more then 20 years      Past Psychiatric History: depression, anxiety  Previous Psychotropic Medications: Yes   Substance Abuse History in the last 12 months:  No.  Consequences of Substance Abuse: NA  Past Medical History:  Past Medical History:  Diagnosis Date   Anemia    Angio-edema    Asthma    Basal cell carcinoma ~ 1999   collarbone   Blood clot in vein ~ 2008   "left breast"    Complication of anesthesia    "I have trouble waking up afterwards" (12/18/2017)   Diarrhea 04/01/2019   Eczema    Family history of adverse reaction to anesthesia    "mom has trouble waking up afterwards" (12/18/2017)   Fluttering heart    "comes and goes" (12/18/2017)   GERD (gastroesophageal reflux disease)    Headache    "weekly" (12/18/2017)   History of kidney stones    Recurrent upper respiratory infection (URI)    Seizures (HCC) 1980 X 1   "bland stare"  Torticollis    Vertigo     Past Surgical History:  Procedure Laterality Date   ANKLE SURGERY Left 2023   [facility]   BASAL CELL CARCINOMA EXCISION Right ~ 1999   "collarbone"   DILATION AND CURETTAGE OF UTERUS  2005   LAPAROSCOPIC ENDOMETRIOSIS FULGURATION  2005   NASAL SEPTOPLASTY W/ TURBINOPLASTY Bilateral 2014   SINOSCOPY     TONSILLECTOMY  1980   TUBAL LIGATION Right 2005   TYMPANOSTOMY TUBE PLACEMENT      Family History:  Family History  Problem Relation Age of Onset   Allergic rhinitis Mother     Asthma Mother     Social History:   Social History   Socioeconomic History   Marital status: Married    Spouse name: Not on file   Number of children: Not on file   Years of education: Not on file   Highest education level: Not on file  Occupational History   Not on file  Tobacco Use   Smoking status: Never    Passive exposure: Never   Smokeless tobacco: Never  Vaping Use   Vaping status: Never Used  Substance and Sexual Activity   Alcohol use: Not Currently    Comment: 12/18/2017 "maybe 1/year on my birthday"  Rarely.   Drug use: No   Sexual activity: Yes    Partners: Male    Birth control/protection: None, OCP  Other Topics Concern   Not on file  Social History Narrative   Caffeine; large soda daily (20oz).    Education : HS some college.   Working:  Editor, commissioning.    Social Determinants of Health   Financial Resource Strain: Not on file  Food Insecurity: No Food Insecurity (07/21/2021)   Hunger Vital Sign    Worried About Running Out of Food in the Last Year: Never true    Ran Out of Food in the Last Year: Never true  Transportation Needs: No Transportation Needs (07/21/2021)   PRAPARE - Administrator, Civil Service (Medical): No    Lack of Transportation (Non-Medical): No  Physical Activity: Not on file  Stress: Not on file  Social Connections: Not on file     Allergies:   Allergies  Allergen Reactions   Amoxicillin Hives   Cefuroxime Axetil Hives   Hydrocodone Itching   Levofloxacin Other (See Comments)    tendonitis   Morphine Itching   Oxycodone-Acetaminophen Itching   Penicillins Hives    Has patient had a PCN reaction causing immediate rash, facial/tongue/throat swelling, SOB or lightheadedness with hypotension: Yes Has patient had a PCN reaction causing severe rash involving mucus membranes or skin necrosis: No Has patient had a PCN reaction that required hospitalization: No Has patient had a PCN reaction occurring within the last 10  years: No If all of the above answers are "NO", then may proceed with Cephalosporin use.    Propoxyphene Itching    Metabolic Disorder Labs: Lab Results  Component Value Date   HGBA1C 5.2 02/08/2023   No results found for: "PROLACTIN" No results found for: "CHOL", "TRIG", "HDL", "CHOLHDL", "VLDL", "LDLCALC" No results found for: "TSH"  Therapeutic Level Labs: No results found for: "LITHIUM" No results found for: "CBMZ" No results found for: "VALPROATE"  Current Medications: Current Outpatient Medications  Medication Sig Dispense Refill   DULoxetine (CYMBALTA) 30 MG capsule Take 1 capsule (30 mg total) by mouth daily. 30 capsule 0   albuterol (PROVENTIL) (2.5 MG/3ML) 0.083% nebulizer  solution Take 3 mLs (2.5 mg total) by nebulization every 4 (four) hours as needed for wheezing or shortness of breath (coughing fits). 75 mL 1   albuterol (VENTOLIN HFA) 108 (90 Base) MCG/ACT inhaler PLEASE SEE ATTACHED FOR DETAILED DIRECTIONS 8.5 each 1   budesonide (PULMICORT) 0.5 MG/2ML nebulizer solution Take 2 mLs (0.5 mg total) by nebulization in the morning and at bedtime. For 1-2 weeks during flares. 120 mL 2   busPIRone (BUSPAR) 7.5 MG tablet TAKE 1 TABLET BY MOUTH DAILY AS NEEDED. 30 tablet 0   citalopram (CELEXA) 10 MG tablet TAKE 1 TABLET BY MOUTH EVERY DAY 90 tablet 1   famotidine (PEPCID) 20 MG tablet TAKE 1 TABLET BY MOUTH TWICE A DAY 180 tablet 1   fluticasone furoate-vilanterol (BREO ELLIPTA) 200-25 MCG/ACT AEPB Inhale 1 puff into the lungs daily. Rinse mouth after each use. 60 each 5   montelukast (SINGULAIR) 10 MG tablet TAKE 1 TABLET BY MOUTH EVERYDAY AT BEDTIME 90 tablet 1   pantoprazole (PROTONIX) 40 MG tablet Take 1 tablet (40 mg total) by mouth in the morning. 30 tablet 2   phentermine (ADIPEX-P) 37.5 MG tablet      Spacer/Aero-Holding Chambers (EQ SPACE CHAMBER ANTI-STATIC) DEVI Inhale into the lungs as directed.     TEZSPIRE 210 MG/1. SOAJ Inject into the skin.      Current Facility-Administered Medications  Medication Dose Route Frequency Provider Last Rate Last Admin   tezepelumab-ekko (TEZSPIRE) 210 MG/1. syringe 210 mg  210 mg Subcutaneous Q28 days Ellamae Sia, DO   210 mg at 06/08/23 1600     Psychiatric Specialty Exam: Review of Systems  Cardiovascular:  Negative for chest pain.  Neurological:  Negative for tremors.    There were no vitals taken for this visit.There is no height or weight on file to calculate BMI.  General Appearance: Casual  Eye Contact:  Fair  Speech:  Normal Rate  Volume:  Decreased  Mood:  subdued or gets agitated  Affect: congruent  Thought Process:  Goal Directed  Orientation:  Full (Time, Place, and Person)  Thought Content:  Logical  Suicidal Thoughts:  No  Homicidal Thoughts:  No  Memory:  Immediate;   Fair  Judgement:  Fair  Insight:  Fair  Psychomotor Activity:  Decreased  Concentration:  Concentration: Fair  Recall:  Good  Fund of Knowledge:Good  Language: Fair  Akathisia:  No  Handed:   AIMS (if indicated):  not done  Assets:  Desire for Improvement Financial Resources/Insurance Housing  ADL's:  Intact  Cognition: WNL  Sleep:  Fair   Screenings: GAD-7    Flowsheet Row Office Visit from 07/03/2023 in [facility] Office Visit from 07/21/2021 in Center for [facility] Office Visit from 04/22/2020 in Center for [facility]  Total GAD-7 Score 19 6 3       PHQ2-9    Flowsheet Row Office Visit from 07/03/2023 in [facility] Office Visit from 03/04/2023 in [facility] Office Visit from 01/25/2023 in [facility] Office Visit from 10/25/2022 in [facility] Office Visit from 07[facility]25/2022 in [facility]  PHQ-2 Total Score 4 3 3  0 2  PHQ-9 Total Score 21 12 15  -- 14       Flowsheet Row Office Visit from 03/04/2023 in [facility] from 12/16/2022 in  [facility] at [facility] ED from 09/05/2021 in [facility] at [facility]  C-SSRS RISK CATEGORY No Risk No Risk No Risk       Assessment and Plan: as follows  Prior documentation reviewed   Major depressive disorder recurrent mild to moderate; subdued, feels edgy at times, will start cymbalta 30mg  increase if needed. If it doesn't help will consider adding mood stabilizer   Re start therapy says got dismissed for missing time or appointment    Generalized anxiety disorder; gets overwhelmed, start cymbalta and may need theray if not here to find outside therapy to deal with husband and stressors  Relationship concerns : gets into argument . Discussed to start therapy see above   Reviewed questions and medications questions were addressed Fu 1 - 71m.  Collaboration of Care: Primary Care Provider AEB chart and notes reviewed  Patient/Guardian was advised Release of Information must be obtained prior to any record release in order to collaborate their care with an outside provider. Patient/Guardian was advised if they have not already done so to contact the registration department to sign all necessary forms in order for [place] to release information regarding their care.   Consent: Patient/Guardian gives verbal consent for treatment and assignment of benefits for services provided during this visit. Patient/Guardian expressed understanding and agreed to proceed.   Thresa Ross, MD 7/16/20243:12 PM

## 2023-07-06 ENCOUNTER — Ambulatory Visit: Payer: BC Managed Care – PPO

## 2023-07-10 NOTE — Progress Notes (Signed)
Internal Medicine Clinic Attending  I was physically present during the key portions of the resident provided service and participated in the medical decision making of patient's management care. I reviewed pertinent patient test results.  The assessment, diagnosis, and plan were formulated together and I agree with the documentation in the resident's note.  As Dr. Mickie Bail stated, when we did a brief pedigree for this person, she had a significant amount of cancer in there immediate family.  She is very concerned and feels that since her sister had an elevated CA 125 and now has diagnosed ovarian cancer, she feels like her abdominal pain could be explained by this diagnosis.  We spoke with her at length and we will start with an abdominal/uterine ultrasound and I have referred her to our new genetic testing program.  She may need referral to a genetic counselor in the future to discuss her concerns as well.   Inez Catalina, MD

## 2023-07-13 ENCOUNTER — Ambulatory Visit (INDEPENDENT_AMBULATORY_CARE_PROVIDER_SITE_OTHER): Payer: BC Managed Care – PPO

## 2023-07-13 DIAGNOSIS — J4550 Severe persistent asthma, uncomplicated: Secondary | ICD-10-CM

## 2023-07-22 NOTE — Telephone Encounter (Signed)
Message left to call back 07/24/2023 to schedule appointments with Coolidge Breeze, LCSW.

## 2023-07-27 ENCOUNTER — Ambulatory Visit (HOSPITAL_COMMUNITY): Payer: BC Managed Care – PPO

## 2023-07-29 ENCOUNTER — Other Ambulatory Visit (HOSPITAL_COMMUNITY): Payer: Self-pay | Admitting: Psychiatry

## 2023-08-03 ENCOUNTER — Telehealth (HOSPITAL_COMMUNITY): Payer: Self-pay | Admitting: *Deleted

## 2023-08-03 MED ORDER — BUSPIRONE HCL 7.5 MG PO TABS: 7.5000 mg | ORAL_TABLET | Freq: Every day | ORAL | 0 refills | Status: DC | PRN

## 2023-08-03 NOTE — Telephone Encounter (Signed)
90 Day Rx Refill Request--  busPIRone (BUSPAR) 7.5 MG tablet 30 tablet 0 06/26/2023 --   Sig - Route: TAKE 1 TABLET BY MOUTH DAILY AS NEEDED. - Oral   Sent to pharmacy as: busPIRone (BUSPAR) 7.5 MG tablet   Notes to Pharmacy: Not decided of maintenance med yet. Dose may change   E-Prescribing Status: Receipt confirmed by pharmacy (06/26/2023  8:47 AM EDT)    Pharmacy  [facility] [phone] - [place], [place] - [address]   Next Appt -- 08/07/23 Last Appt  -- 07/04/23

## 2023-08-03 NOTE — Addendum Note (Signed)
Addended by: Thresa Ross on: 08/03/2023 08:54 AM   Modules accepted: Orders

## 2023-08-04 ENCOUNTER — Other Ambulatory Visit (HOSPITAL_COMMUNITY): Payer: BC Managed Care – PPO

## 2023-08-07 ENCOUNTER — Encounter (HOSPITAL_COMMUNITY): Payer: Self-pay | Admitting: Psychiatry

## 2023-08-07 ENCOUNTER — Telehealth (HOSPITAL_COMMUNITY): Payer: BC Managed Care – PPO | Admitting: Psychiatry

## 2023-08-07 DIAGNOSIS — F331 Major depressive disorder, recurrent, moderate: Secondary | ICD-10-CM | POA: Diagnosis not present

## 2023-08-07 DIAGNOSIS — Z62820 Parent-biological child conflict: Secondary | ICD-10-CM | POA: Diagnosis not present

## 2023-08-07 DIAGNOSIS — F411 Generalized anxiety disorder: Secondary | ICD-10-CM | POA: Diagnosis not present

## 2023-08-07 NOTE — Progress Notes (Unsigned)
Follow Up Note  RE: Erin Collins MRN: [id number] DOB: 1976/09/22 Date of Office Visit: 08/08/2023  Referring provider: Karoline Caldwell, MD Primary care provider: Kathleen Lime, MD  Chief Complaint: No chief complaint on file.  History of Present Illness: I had the pleasure of seeing Erin Collins for a follow up visit at the [facility] on 08/07/2023. She is a 47 y.o. female, who is being followed for asthma on Tezspire, allergic rhino conjunctivitis, GERD, multiple drug allergies and atopic dermatitis. Her previous allergy office visit was on 05/09/2023 with Dr. Selena Batten. Today is a regular follow up visit.  Severe persistent asthma Past history - Diagnosed with asthma over 19 years ago. Patient moved in with her parents who have cats and dogs at home during this time. Did not tolerate Trelegy, Breztri no as effective. Flares in the spring and fall.  Interim history - feeling better with Tezspire and stopped Breo and Spiriva with no worsening symptoms. Still has some throat clearing.  Today's spirometry showed some mild restriction.  Continue Tezspire injections every 4 weeks.  Daily controller medication(s):  Restart Breo 1 puff once a day and rinse mouth after each use.  After she finished her dose then will decrease to 100mg  dose.  Stop Spiriva.  Continue Singulair 10mg  daily During respiratory infections/flares:  Start budesonide (Pulmicort) 0.5mg  nebulizer for 1-2 weeks until your breathing symptoms return to baseline.  Pretreat with albuterol 2 puffs or albuterol nebulizer.  If you need to use your albuterol nebulizer machine back to back within 15-30 minutes with no relief then please go to the ER/urgent care for further evaluation.  May use albuterol rescue inhaler 2 puffs or nebulizer every 4 to 6 hours as needed for shortness of breath, chest tightness, coughing, and wheezing. May use albuterol rescue inhaler 2 puffs 5 to 15 minutes prior to strenuous  physical activities. Monitor frequency of use.  Get spirometry at next visit.   Seasonal and perennial allergic rhinoconjunctivitis Past history - Perennial rhinoconjunctivitis symptoms for the past 20 years with worsening in the spring and fall.  Allergy immunotherapy for 10 years with good benefit. 2019 skin testing showed, positive to dust mites, cat, dog, grass and horse. 2 cats at home. 2022 bloodwork positive to dust mites, cat, dog, grass pollen. Borderline to cockroach, mouse and maple/box elder tree pollen. Atrovent burns sinuses.  Interim history - some throat clearing.  Continue environmental control measures - especially regarding the cats. Continue Singulair (montelukast) 10mg  daily.  Use over the counter antihistamines such as Zyrtec (cetirizine), Claritin (loratadine), Allegra (fexofenadine), or Xyzal (levocetirizine) daily as needed. May take twice a day during allergy flares. May switch antihistamines every few months. Use Flonase (fluticasone) nasal spray 1 spray per nostril twice a day as needed for nasal congestion.  Nasal saline spray (i.e., Simply Saline) or nasal saline lavage (i.e., NeilMed) is recommended as needed and prior to medicated nasal sprays. May use refresh eye drops.  Refer to ENT for throat clearing.    Gastroesophageal reflux disease Controlled. Continue lifestyle and dietary modifications. Continue famotidine 20mg  twice a day. Continue pantoprazole 40mg  one a day in the morning with nothing to eat or drink for 30 min afterwards. Consider GI evaluation - EGD?   Multiple drug allergies Past history - reactions to amoxicillin, cefuroxime, hydrocodone, levofloxacin, morphine, oxycodone, and propoxyphene in the past.  Continue to avoid all these medications.  Consider penicillin skin testing/drug challenge in the future. More than  90% of patients outgrow their penicillin allergy.    Other atopic dermatitis Past history - Eucrisa not effective.  Continue  proper skin care measures. Use epicerum moisturizer. Use hydrocortisone 1% cream twice a day as needed for mild rash flares - okay to use on the face, neck, groin area. Do not use more than 1 week at a time.   Return in about 3 months (around 08/09/2023).    Assessment and Plan: Erin Collins is a 46 y.o. female with: Severe persistent asthma Past history - Diagnosed with asthma over 19 years ago. Patient moved in with her parents who have cats and dogs at home during this time. Did not tolerate Trelegy, Breztri no as effective. Flares in the spring and fall.   Seasonal allergic rhinitis due to pollen Allergic rhinitis due to animal dander Allergic rhinitis due to dust mite Allergy to cockroaches Past history - Perennial rhinoconjunctivitis symptoms for the past 20 years with worsening in the spring and fall.  Allergy immunotherapy for 10 years with good benefit. 2019 skin testing showed, positive to dust mites, cat, dog, grass and horse. 2 cats at home. 2022 bloodwork positive to dust mites, cat, dog, grass pollen. Borderline to cockroach, mouse and maple/box elder tree pollen. Atrovent burns sinuses.   Multiple drug allergies Past history - reactions to amoxicillin, cefuroxime, hydrocodone, levofloxacin, morphine, oxycodone, and propoxyphene in the past.   Other atopic dermatitis Past history - Eucrisa not effective.   Gastroesophageal reflux disease, unspecified whether esophagitis present ***   No follow-ups on file.  No orders of the defined types were placed in this encounter.  Lab Orders  No laboratory test(s) ordered today    Diagnostics: Spirometry:  Tracings reviewed. Her effort: {Blank single:19197::"Good reproducible efforts.","It was hard to get consistent efforts and there is a question as to whether this reflects a maximal maneuver.","Poor effort, data can not be interpreted."} FVC: ***L FEV1: ***L, ***% predicted FEV1/FVC ratio: ***% Interpretation: {Blank  single:19197::"Spirometry consistent with mild obstructive disease","Spirometry consistent with moderate obstructive disease","Spirometry consistent with severe obstructive disease","Spirometry consistent with possible restrictive disease","Spirometry consistent with mixed obstructive and restrictive disease","Spirometry uninterpretable due to technique","Spirometry consistent with normal pattern","No overt abnormalities noted given today's efforts"}.  Please see scanned spirometry results for details.  Skin Testing: {Blank single:19197::"Select foods","Environmental allergy panel","Environmental allergy panel and select foods","Food allergy panel","None","Deferred due to recent antihistamines use"}. *** Results discussed with patient/family.   Medication List:  Current Outpatient Medications  Medication Sig Dispense Refill  . albuterol (PROVENTIL) (2.5 MG/3ML) 0.083% nebulizer solution Take 3 mLs (2.5 mg total) by nebulization every 4 (four) hours as needed for wheezing or shortness of breath (coughing fits). 75 mL 1  . albuterol (VENTOLIN HFA) 108 (90 Base) MCG/ACT inhaler PLEASE SEE ATTACHED FOR DETAILED DIRECTIONS 8.5 each 1  . budesonide (PULMICORT) 0.5 MG/2ML nebulizer solution Take 2 mLs (0.5 mg total) by nebulization in the morning and at bedtime. For 1-2 weeks during flares. 120 mL 2  . busPIRone (BUSPAR) 7.5 MG tablet Take 1 tablet (7.5 mg total) by mouth daily as needed. 30 tablet 0  . citalopram (CELEXA) 10 MG tablet TAKE 1 TABLET BY MOUTH EVERY DAY 90 tablet 1  . DULoxetine (CYMBALTA) 30 MG capsule TAKE 1 CAPSULE BY MOUTH EVERY DAY 90 capsule 0  . famotidine (PEPCID) 20 MG tablet TAKE 1 TABLET BY MOUTH TWICE A DAY 180 tablet 1  . fluticasone furoate-vilanterol (BREO ELLIPTA) 200-25 MCG/ACT AEPB Inhale 1 puff into the lungs daily. Rinse mouth after each  use. 60 each 5  . montelukast (SINGULAIR) 10 MG tablet TAKE 1 TABLET BY MOUTH EVERYDAY AT BEDTIME 90 tablet 1  . pantoprazole  (PROTONIX) 40 MG tablet Take 1 tablet (40 mg total) by mouth in the morning. 30 tablet 2  . phentermine (ADIPEX-P) 37.5 MG tablet     . Spacer/Aero-Holding Chambers (EQ SPACE CHAMBER ANTI-STATIC) DEVI Inhale into the lungs as directed.    . TEZSPIRE 210 MG/1. SOAJ Inject into the skin.     Current Facility-Administered Medications  Medication Dose Route Frequency Provider Last Rate Last Admin  . tezepelumab-ekko (TEZSPIRE) 210 MG/1. syringe 210 mg  210 mg Subcutaneous Q28 days Ellamae Sia, DO   210 mg at 07/13/23 1223   Allergies: Allergies  Allergen Reactions  . Amoxicillin Hives  . Cefuroxime Axetil Hives  . Hydrocodone Itching  . Levofloxacin Other (See Comments)    tendonitis  . Morphine Itching  . Oxycodone-Acetaminophen Itching  . Penicillins Hives    Has patient had a PCN reaction causing immediate rash, facial/tongue/throat swelling, SOB or lightheadedness with hypotension: Yes Has patient had a PCN reaction causing severe rash involving mucus membranes or skin necrosis: No Has patient had a PCN reaction that required hospitalization: No Has patient had a PCN reaction occurring within the last 10 years: No If all of the above answers are "NO", then may proceed with Cephalosporin use.   Marland Kitchen Propoxyphene Itching   I reviewed her past medical history, social history, family history, and environmental history and no significant changes have been reported from her previous visit.  Review of Systems  Constitutional:  Negative for appetite change, chills, fever and unexpected weight change.  HENT:  Negative for congestion and rhinorrhea.        Throat clearing  Eyes:  Negative for itching.  Respiratory:  Negative for cough, chest tightness, shortness of breath and wheezing.   Gastrointestinal:  Negative for abdominal pain.  Skin:  Negative for rash.  Allergic/Immunologic: Positive for environmental allergies. Negative for food allergies.  Neurological:  Negative for  headaches.   Objective: There were no vitals taken for this visit. There is no height or weight on file to calculate BMI. Physical Exam Vitals and nursing note reviewed.  Constitutional:      Appearance: Normal appearance. She is well-developed.  HENT:     Head: Normocephalic and atraumatic.     Right Ear: Tympanic membrane and external ear normal.     Left Ear: Tympanic membrane and external ear normal.     Nose: Nose normal.     Mouth/Throat:     Mouth: Mucous membranes are moist.     Pharynx: Oropharynx is clear.  Eyes:     Conjunctiva/sclera: Conjunctivae normal.  Cardiovascular:     Rate and Rhythm: Normal rate and regular rhythm.     Heart sounds: Normal heart sounds. No murmur heard. Pulmonary:     Effort: Pulmonary effort is normal.     Breath sounds: Normal breath sounds. No wheezing, rhonchi or rales.  Musculoskeletal:     Cervical back: Neck supple.  Skin:    General: Skin is warm.     Findings: No rash.  Neurological:     Mental Status: She is alert and oriented to person, place, and time.  Psychiatric:        Behavior: Behavior normal.  Previous notes and tests were reviewed. The plan was reviewed with the patient/family, and all questions/concerned were addressed.  It was my pleasure to see  Erin Collins today and participate in her care. Please feel free to contact me with any questions or concerns.  Sincerely,  Wyline Mood, DO Allergy & Immunology  [facility] office: [phone] [facility] office: [phone]

## 2023-08-07 NOTE — Progress Notes (Signed)
[facility] Follow up visit  Patient Identification: Erin Collins MRN:  [id number] Date of Evaluation:  08/07/2023 Referral Source: primary care Chief Complaint:   No chief complaint on file. Follow up depression, mood Visit Diagnosis:    ICD-10-CM   1. MDD (major depressive disorder), recurrent episode, moderate (HCC)  F33.1     2. GAD (generalized anxiety disorder)  F41.1     Virtual Visit via Video Note  I connected with Erin Collins on 08/07/23 at  3:30 PM EDT by a video enabled telemedicine application and verified that I am speaking with the correct person using two identifiers.  Location: Patient: home Provider: home office   I discussed the limitations of evaluation and management by telemedicine and the availability of in person appointments. The patient expressed understanding and agreed to proceed.     I discussed the assessment and treatment plan with the patient. The patient was provided an opportunity to ask questions and all were answered. The patient agreed with the plan and demonstrated an understanding of the instructions.   The patient was advised to call back or seek an in-person evaluation if the symptoms worsen or if the condition fails to improve as anticipated.  I provided 20 minutes of non-face-to-face time during this encounter.       History of Present Illness: Patient is a 47 years old currently married Caucasian female referred initially by primary care physician to establish care for possibility of depression, anxiety this is her second marriage she has 2 stepdaughters who are teenagers patient works full-time as a Careers information officer   Patient was having depression and stressors related to difficulty dealing with her teenage daughters patient has OCD about cleanliness and they would not clean their mess  Last visit celexa and buspar was stopped cymbalta was started as others werent working  Doing better, more calm. Less agitated. Went for vacation  did better in relationship Daughters kept the house clean when they returned  Husband has bipolar  Aggravating factors; relationship concerns, and job stress has been single for 20 years while living in [place] she states they are to keep near to her son till he turns age 28, sisters illness  Modifying factors family, mom, ttravelling  Duration more then 20 years  Severity improved     Past Psychiatric History: depression, anxiety  Previous Psychotropic Medications: Yes   Substance Abuse History in the last 12 months:  No.  Consequences of Substance Abuse: NA  Past Medical History:  Past Medical History:  Diagnosis Date   Anemia    Angio-edema    Asthma    Basal cell carcinoma ~ 1999   collarbone   Blood clot in vein ~ 2008   "left breast"    Complication of anesthesia    "I have trouble waking up afterwards" (12/18/2017)   Diarrhea 04/01/2019   Eczema    Family history of adverse reaction to anesthesia    "mom has trouble waking up afterwards" (12/18/2017)   Fluttering heart    "comes and goes" (12/18/2017)   GERD (gastroesophageal reflux disease)    Headache    "weekly" (12/18/2017)   History of kidney stones    Recurrent upper respiratory infection (URI)    Seizures (HCC) 1980 X 1   "bland stare"   Torticollis    Vertigo     Past Surgical History:  Procedure Laterality Date   ANKLE SURGERY Left 2023   [facility]  BASAL CELL CARCINOMA EXCISION Right ~ 1999   "collarbone"   DILATION AND CURETTAGE OF UTERUS  2005   LAPAROSCOPIC ENDOMETRIOSIS FULGURATION  2005   NASAL SEPTOPLASTY W/ TURBINOPLASTY Bilateral 2014   SINOSCOPY     TONSILLECTOMY  1980   TUBAL LIGATION Right 2005   TYMPANOSTOMY TUBE PLACEMENT      Family History:  Family History  Problem Relation Age of Onset   Allergic rhinitis Mother    Asthma Mother     Social History:   Social History   Socioeconomic History   Marital status: Married    Spouse name: Not on  file   Number of children: Not on file   Years of education: Not on file   Highest education level: Not on file  Occupational History   Not on file  Tobacco Use   Smoking status: Never    Passive exposure: Never   Smokeless tobacco: Never  Vaping Use   Vaping status: Never Used  Substance and Sexual Activity   Alcohol use: Not Currently    Comment: 12/18/2017 "maybe 1/year on my birthday"  Rarely.   Drug use: No   Sexual activity: Yes    Partners: Male    Birth control/protection: None, OCP  Other Topics Concern   Not on file  Social History Narrative   Caffeine; large soda daily (20oz).    Education : HS some college.   Working:  Editor, commissioning.    Social Determinants of Health   Financial Resource Strain: Not on file  Food Insecurity: No Food Insecurity (07/21/2021)   Hunger Vital Sign    Worried About Running Out of Food in the Last Year: Never true    Ran Out of Food in the Last Year: Never true  Transportation Needs: No Transportation Needs (07/21/2021)   PRAPARE - Administrator, Civil Service (Medical): No    Lack of Transportation (Non-Medical): No  Physical Activity: Not on file  Stress: Not on file  Social Connections: Not on file     Allergies:   Allergies  Allergen Reactions   Amoxicillin Hives   Cefuroxime Axetil Hives   Hydrocodone Itching   Levofloxacin Other (See Comments)    tendonitis   Morphine Itching   Oxycodone-Acetaminophen Itching   Penicillins Hives    Has patient had a PCN reaction causing immediate rash, facial/tongue/throat swelling, SOB or lightheadedness with hypotension: Yes Has patient had a PCN reaction causing severe rash involving mucus membranes or skin necrosis: No Has patient had a PCN reaction that required hospitalization: No Has patient had a PCN reaction occurring within the last 10 years: No If all of the above answers are "NO", then may proceed with Cephalosporin use.    Propoxyphene Itching    Metabolic  Disorder Labs: Lab Results  Component Value Date   HGBA1C 5.2 02/08/2023   No results found for: "PROLACTIN" No results found for: "CHOL", "TRIG", "HDL", "CHOLHDL", "VLDL", "LDLCALC" No results found for: "TSH"  Therapeutic Level Labs: No results found for: "LITHIUM" No results found for: "CBMZ" No results found for: "VALPROATE"  Current Medications: Current Outpatient Medications  Medication Sig Dispense Refill   albuterol (PROVENTIL) (2.5 MG/3ML) 0.083% nebulizer solution Take 3 mLs (2.5 mg total) by nebulization every 4 (four) hours as needed for wheezing or shortness of breath (coughing fits). 75 mL 1   albuterol (VENTOLIN HFA) 108 (90 Base) MCG/ACT inhaler PLEASE SEE ATTACHED FOR DETAILED DIRECTIONS 8.5 each 1   budesonide (PULMICORT) 0.5  MG/2ML nebulizer solution Take 2 mLs (0.5 mg total) by nebulization in the morning and at bedtime. For 1-2 weeks during flares. 120 mL 2   DULoxetine (CYMBALTA) 30 MG capsule TAKE 1 CAPSULE BY MOUTH EVERY DAY 90 capsule 0   famotidine (PEPCID) 20 MG tablet TAKE 1 TABLET BY MOUTH TWICE A DAY 180 tablet 1   fluticasone furoate-vilanterol (BREO ELLIPTA) 200-25 MCG/ACT AEPB Inhale 1 puff into the lungs daily. Rinse mouth after each use. 60 each 5   montelukast (SINGULAIR) 10 MG tablet TAKE 1 TABLET BY MOUTH EVERYDAY AT BEDTIME 90 tablet 1   pantoprazole (PROTONIX) 40 MG tablet Take 1 tablet (40 mg total) by mouth in the morning. 30 tablet 2   phentermine (ADIPEX-P) 37.5 MG tablet      Spacer/Aero-Holding Chambers (EQ SPACE CHAMBER ANTI-STATIC) DEVI Inhale into the lungs as directed.     TEZSPIRE 210 MG/1. SOAJ Inject into the skin.     Current Facility-Administered Medications  Medication Dose Route Frequency Provider Last Rate Last Admin   tezepelumab-ekko (TEZSPIRE) 210 MG/1. syringe 210 mg  210 mg Subcutaneous Q28 days Ellamae Sia, DO   210 mg at 07/13/23 1223     Psychiatric Specialty Exam: Review of Systems  Cardiovascular:   Negative for chest pain.  Neurological:  Negative for tremors.  Psychiatric/Behavioral:  The patient is nervous/anxious.     There were no vitals taken for this visit.There is no height or weight on file to calculate BMI.  General Appearance: Casual  Eye Contact:  Fair  Speech:  Normal Rate  Volume:  Decreased  Mood: better  Affect: congruent  Thought Process:  Goal Directed  Orientation:  Full (Time, Place, and Person)  Thought Content:  Logical  Suicidal Thoughts:  No  Homicidal Thoughts:  No  Memory:  Immediate;   Fair  Judgement:  Fair  Insight:  Fair  Psychomotor Activity:  Decreased  Concentration:  Concentration: Fair  Recall:  Good  Fund of Knowledge:Good  Language: Fair  Akathisia:  No  Handed:   AIMS (if indicated):  not done  Assets:  Desire for Improvement Financial Resources/Insurance Housing  ADL's:  Intact  Cognition: WNL  Sleep:  Fair   Screenings: GAD-7    Flowsheet Row Office Visit from 07/03/2023 in [facility] Office Visit [facility] Office Visit from 04/22/2020 in [facility]  Total GAD-7 Score 19 6 3       PHQ2-9    Flowsheet Row Office Visit from 07/03/2023 in [facility] Office Visit from 03[facility]04/2023 in [facility] Office Visit from 01/25/2023 in [facility] Office Visit from 10/25/2022 in [facility] Office Visit from 07/25/2022 in [facility]  PHQ-2 Total Score 4 3 3  0 2  PHQ-9 Total Score 21 12 15  -- 14      Flowsheet Row Office Visit from 03/04/2023 in [facility] from 12/16/2022 in [facility] at [facility] ED from 09/05/2021 in [facility] Emergency Department at [facility]  C-SSRS  RISK CATEGORY No Risk No Risk No Risk       Assessment and Plan: as follows  Prior documentation reviewed    Major depressive disorder recurrent mild to moderate; better continue cymbalta 30mg , mood stabilizer addition not needed  Generalized anxiety disorder; better continue coping skills and cymbalta   Relationship concerns : improved, continue coping skills and cymbalta   Reviewed questions and medications questions were addressed Fu  43m.  Collaboration of Care: Primary Care Provider AEB chart and notes reviewed  Patient/Guardian was advised Release of Information must be obtained prior to any record release in order to collaborate their care with an outside provider. Patient/Guardian was advised if they have not already done so to contact the registration department to sign all necessary forms in order for [place] to release information regarding their care.   Consent: Patient/Guardian gives verbal consent for treatment and assignment of benefits for services provided during this visit. Patient/Guardian expressed understanding and agreed to proceed.   Thresa Ross, MD 8/19/20243:39 PM

## 2023-08-08 ENCOUNTER — Encounter: Payer: Self-pay | Admitting: Allergy

## 2023-08-08 ENCOUNTER — Ambulatory Visit: Payer: BC Managed Care – PPO | Admitting: Allergy

## 2023-08-08 VITALS — BP 110/76 | HR 90 | Temp 98.1°F | Resp 18 | Wt 179.8 lb

## 2023-08-08 DIAGNOSIS — J4550 Severe persistent asthma, uncomplicated: Secondary | ICD-10-CM

## 2023-08-08 DIAGNOSIS — L2089 Other atopic dermatitis: Secondary | ICD-10-CM

## 2023-08-08 DIAGNOSIS — Z91038 Other insect allergy status: Secondary | ICD-10-CM

## 2023-08-08 DIAGNOSIS — J3081 Allergic rhinitis due to animal (cat) (dog) hair and dander: Secondary | ICD-10-CM

## 2023-08-08 DIAGNOSIS — J301 Allergic rhinitis due to pollen: Secondary | ICD-10-CM | POA: Diagnosis not present

## 2023-08-08 DIAGNOSIS — K219 Gastro-esophageal reflux disease without esophagitis: Secondary | ICD-10-CM

## 2023-08-08 DIAGNOSIS — J3089 Other allergic rhinitis: Secondary | ICD-10-CM

## 2023-08-08 DIAGNOSIS — J988 Other specified respiratory disorders: Secondary | ICD-10-CM

## 2023-08-08 DIAGNOSIS — R0989 Other specified symptoms and signs involving the circulatory and respiratory systems: Secondary | ICD-10-CM

## 2023-08-08 DIAGNOSIS — Z889 Allergy status to unspecified drugs, medicaments and biological substances status: Secondary | ICD-10-CM

## 2023-08-08 MED ORDER — ALBUTEROL SULFATE HFA 108 (90 BASE) MCG/ACT IN AERS: 2.0000 | INHALATION_SPRAY | RESPIRATORY_TRACT | 1 refills | Status: DC | PRN

## 2023-08-08 MED ORDER — AZITHROMYCIN 250 MG PO TABS: ORAL_TABLET | ORAL | 0 refills | Status: DC

## 2023-08-08 MED ORDER — MONTELUKAST SODIUM 10 MG PO TABS: 10.0000 mg | ORAL_TABLET | Freq: Every day | ORAL | 3 refills | Status: DC

## 2023-08-08 NOTE — Patient Instructions (Addendum)
Moderate persistent asthma ? Respiratory infection. If mucous is not better, you can start zpak.  Continue Tezspire injections every 4 weeks - given today. You may self inject at home every 4 weeks.  Daily controller medication(s):  continue Breo 1 puff once a day and rinse mouth after each use.  Continue Singulair 10mg  daily During respiratory infections/flares:  Start budesonide (Pulmicort) 0.5mg  nebulizer for 1-2 weeks until your breathing symptoms return to baseline.  Pretreat with albuterol 2 puffs or albuterol nebulizer.  If you need to use your albuterol nebulizer machine back to back within 15-30 minutes with no relief then please go to the ER/urgent care for further evaluation.  May use albuterol rescue inhaler 2 puffs or nebulizer every 4 to 6 hours as needed for shortness of breath, chest tightness, coughing, and wheezing. May use albuterol rescue inhaler 2 puffs 5 to 15 minutes prior to strenuous physical activities. Monitor frequency of use.  Asthma control goals:  Full participation in all desired activities (may need albuterol before activity) Albuterol use two times or less a week on average (not counting use with activity) Cough interfering with sleep two times or less a month Oral steroids no more than once a year No hospitalizations    Seasonal and perennial allergic rhinoconjunctivitis 2022 bloodwork positive to dust mites, cat, dog, grass pollen. Borderline to cockroach, mouse and maple/box elder tree pollen.  Continue environmental control measures - especially regarding the cats. Continue Singulair (montelukast) 10mg  daily.  Use over the counter antihistamines such as Zyrtec (cetirizine), Claritin (loratadine), Allegra (fexofenadine), or Xyzal (levocetirizine) daily as needed. May take twice a day during allergy flares. May switch antihistamines every few months. Use Flonase (fluticasone) nasal spray 1 spray per nostril twice a day as needed for nasal  congestion.  Nasal saline spray (i.e., Simply Saline) or nasal saline lavage (i.e., NeilMed) is recommended as needed and prior to medicated nasal sprays. May use refresh eye drops.   Throat clearing Make sure you go see ENT.   Heartburn Continue lifestyle and dietary modifications. Continue famotidine 20mg  once a day.   Other atopic dermatitis Continue proper skin care measures. Use epicerum moisturizer. Use hydrocortisone 1% cream twice a day as needed for mild rash flares - okay to use on the face, neck, groin area. Do not use more than 1 week at a time.   Multiple drug allergies Continue to avoid medications on your allergy list.  Consider penicillin skin testing/drug challenge in the future. If interested we can schedule drug challenge to penicillin. You must be off antihistamines for 3-5 days before. Must be in good health and not ill. Plan on being in the office for 2-3 hours and must bring in the drug you want to do the oral challenge for - will send in prescription to pick up a few days before. You must call to schedule an appointment and specify it's for a drug challenge.  More than 90% of patients outgrow their penicillin allergy.    Follow up in 4 months or sooner if needed.

## 2023-08-09 ENCOUNTER — Ambulatory Visit (HOSPITAL_COMMUNITY): Payer: BC Managed Care – PPO

## 2023-08-10 ENCOUNTER — Ambulatory Visit: Payer: BC Managed Care – PPO

## 2023-08-16 ENCOUNTER — Ambulatory Visit (HOSPITAL_COMMUNITY)
Admission: RE | Admit: 2023-08-16 | Discharge: 2023-08-16 | Disposition: A | Discharge status: Home / Self Care | Payer: BC Managed Care – PPO | Source: Ambulatory Visit | Attending: Internal Medicine | Admitting: Internal Medicine

## 2023-08-16 DIAGNOSIS — J3081 Allergic rhinitis due to animal (cat) (dog) hair and dander: Secondary | ICD-10-CM | POA: Diagnosis not present

## 2023-08-16 DIAGNOSIS — R109 Unspecified abdominal pain: Secondary | ICD-10-CM | POA: Diagnosis not present

## 2023-08-16 DIAGNOSIS — R0981 Nasal congestion: Secondary | ICD-10-CM | POA: Diagnosis not present

## 2023-08-16 DIAGNOSIS — K219 Gastro-esophageal reflux disease without esophagitis: Secondary | ICD-10-CM | POA: Diagnosis not present

## 2023-08-16 DIAGNOSIS — J343 Hypertrophy of nasal turbinates: Secondary | ICD-10-CM | POA: Diagnosis not present

## 2023-08-16 DIAGNOSIS — Z8041 Family history of malignant neoplasm of ovary: Secondary | ICD-10-CM | POA: Diagnosis not present

## 2023-08-24 NOTE — Progress Notes (Signed)
I called the Erin Collins on the phone and spoke to her for about 12 minutes discussing her recent US pelvic Complete With Transvaginal findings with her and to follow up since her last office visit with me on 07/03/2023 concerning abdominal pain.   Patient was very relieved of the findings knowing that there was no evidence of ovarian mass. All patient's concerns were addressed during this telephone encounter.

## 2023-09-05 ENCOUNTER — Ambulatory Visit: Payer: BC Managed Care – PPO

## 2023-10-12 ENCOUNTER — Ambulatory Visit: Payer: BC Managed Care – PPO

## 2023-10-12 DIAGNOSIS — J4550 Severe persistent asthma, uncomplicated: Secondary | ICD-10-CM | POA: Diagnosis not present

## 2023-10-16 ENCOUNTER — Encounter (HOSPITAL_COMMUNITY): Payer: Self-pay | Admitting: Psychiatry

## 2023-10-16 ENCOUNTER — Telehealth (INDEPENDENT_AMBULATORY_CARE_PROVIDER_SITE_OTHER): Payer: BC Managed Care – PPO | Admitting: Psychiatry

## 2023-10-16 DIAGNOSIS — F411 Generalized anxiety disorder: Secondary | ICD-10-CM | POA: Diagnosis not present

## 2023-10-16 DIAGNOSIS — F331 Major depressive disorder, recurrent, moderate: Secondary | ICD-10-CM | POA: Diagnosis not present

## 2023-10-16 DIAGNOSIS — Z630 Problems in relationship with spouse or partner: Secondary | ICD-10-CM | POA: Diagnosis not present

## 2023-10-16 MED ORDER — DULOXETINE HCL 40 MG PO CPEP: 40.0000 mg | ORAL_CAPSULE | Freq: Every day | ORAL | 0 refills | Status: DC

## 2023-10-16 NOTE — Progress Notes (Signed)
[facility] Follow up visit  Patient Identification: Erin Collins MRN:  [id number] Date of Evaluation:  10/16/2023 Referral Source: primary care Chief Complaint:   No chief complaint on file. Follow up depression, mood Visit Diagnosis:    ICD-10-CM   1. MDD (major depressive disorder), recurrent episode, moderate (HCC)  F33.1     2. GAD (generalized anxiety disorder)  F41.1      Virtual Visit via Video Note  I connected with Jennette Dubin Bott on 10/16/23 at  3:00 PM EDT by a video enabled telemedicine application and verified that I am speaking with the correct person using two identifiers.  Location: Patient: work Provider: home office   I discussed the limitations of evaluation and management by telemedicine and the availability of in person appointments. The patient expressed understanding and agreed to proceed.     I discussed the assessment and treatment plan with the patient. The patient was provided an opportunity to ask questions and all were answered. The patient agreed with the plan and demonstrated an understanding of the instructions.   The patient was advised to call back or seek an in-person evaluation if the symptoms worsen or if the condition fails to improve as anticipated.  I provided 20 minutes of non-face-to-face time during this encounter.     History of Present Illness: Patient is a 47 years old currently married Caucasian female referred initially by primary care physician to establish care for possibility of depression, anxiety this is her second marriage she has 2 stepdaughters who are teenagers patient works full-time as a Careers information officer   Was doing fair, daughter cleaning up better but anxiety related to husband not having a job and [facility], he has bIPolar     Aggravating factors; relationship concerns , and job stress has been single for 20 years while living in [place] she states they are to keep near to her son  till he turns age 81, sisters illness  Modifying factors family, mom, travelling   Duration more then 20 years  Severity some stress due to above    Past Psychiatric History: depression, anxiety  Previous Psychotropic Medications: Yes   Substance Abuse History in the last 12 months:  No.  Consequences of Substance Abuse: NA  Past Medical History:  Past Medical History:  Diagnosis Date   Anemia    Angio-edema    Asthma    Basal cell carcinoma ~ 1999   collarbone   Blood clot in vein ~ 2008   "left breast"    Complication of anesthesia    "I have trouble waking up afterwards" (12/18/2017)   Diarrhea 04/01/2019   Eczema    Family history of adverse reaction to anesthesia    "mom has trouble waking up afterwards" (12/18/2017)   Fluttering heart    "comes and goes" (12/18/2017)   GERD (gastroesophageal reflux disease)    Headache    "weekly" (12/18/2017)   History of kidney stones    Recurrent upper respiratory infection (URI)    Seizures (HCC) 1980 X 1   "bland stare"   Torticollis    Vertigo     Past Surgical History:  Procedure Laterality Date   ANKLE SURGERY Left 2023   [facility]   BASAL CELL CARCINOMA EXCISION Right ~ 1999   "collarbone"   DILATION AND CURETTAGE OF UTERUS  2005   LAPAROSCOPIC ENDOMETRIOSIS FULGURATION  2005   NASAL SEPTOPLASTY W/  TURBINOPLASTY Bilateral 2014   SINOSCOPY     TONSILLECTOMY  1980   TUBAL LIGATION Right 2005   TYMPANOSTOMY TUBE PLACEMENT      Family History:  Family History  Problem Relation Age of Onset   Allergic rhinitis Mother    Asthma Mother     Social History:   Social History   Socioeconomic History   Marital status: Married    Spouse name: Not on file   Number of children: Not on file   Years of education: Not on file   Highest education level: Not on file  Occupational History   Not on file  Tobacco Use   Smoking status: Never    Passive exposure: Never   Smokeless tobacco:  Never  Vaping Use   Vaping status: Never Used  Substance and Sexual Activity   Alcohol use: Not Currently    Comment: 12/18/2017 "maybe 1/year on my birthday"  Rarely.   Drug use: No   Sexual activity: Yes    Partners: Male    Birth control/protection: None, OCP  Other Topics Concern   Not on file  Social History Narrative   Caffeine; large soda daily (20oz).    Education : HS some college.   Working:  Editor, commissioning.    Social Determinants of Health   Financial Resource Strain: Not on file  Food Insecurity: Low Risk  (08/16/2023)   Received from [facility]   Hunger Vital Sign    Worried About Running Out of Food in the Last Year: Never true    Ran Out of Food in the Last Year: Never true  Transportation Needs: No Transportation Needs (07/21/2021)   PRAPARE - Administrator, Civil Service (Medical): No    Lack of Transportation (Non-Medical): No  Physical Activity: Not on file  Stress: Not on file  Social Connections: Not on file     Allergies:   Allergies  Allergen Reactions   Amoxicillin Hives   Cefuroxime Axetil Hives   Hydrocodone Itching   Levofloxacin Other (See Comments)    tendonitis   Morphine Itching   Oxycodone-Acetaminophen Itching   Penicillins Hives    Has patient had a PCN reaction causing immediate rash, facial/tongue/throat swelling, SOB or lightheadedness with hypotension: Yes Has patient had a PCN reaction causing severe rash involving mucus membranes or skin necrosis: No Has patient had a PCN reaction that required hospitalization: No Has patient had a PCN reaction occurring within the last 10 years: No If all of the above answers are "NO", then may proceed with Cephalosporin use.    Propoxyphene Itching    Metabolic Disorder Labs: Lab Results  Component Value Date   HGBA1C 5.2 02/08/2023   No results found for: "PROLACTIN" No results found for: "CHOL", "TRIG", "HDL", "CHOLHDL", "VLDL", "LDLCALC" No results found for:  "TSH"  Therapeutic Level Labs: No results found for: "LITHIUM" No results found for: "CBMZ" No results found for: "VALPROATE"  Current Medications: Current Outpatient Medications  Medication Sig Dispense Refill   albuterol (PROVENTIL) (2.5 MG/3ML) 0.083% nebulizer solution Take 3 mLs (2.5 mg total) by nebulization every 4 (four) hours as needed for wheezing or shortness of breath (coughing fits). 75 mL 1   albuterol (VENTOLIN HFA) 108 (90 Base) MCG/ACT inhaler Inhale 2 puffs into the lungs every 4 (four) hours as needed for wheezing or shortness of breath (coughing fits). 18 g 1   azithromycin (ZITHROMAX Z-PAK) 250 MG tablet Take 2 tablets on day 1. Then 1  tablet days 2-5. 6 each 0   budesonide (PULMICORT) 0.5 MG/2ML nebulizer solution Take 2 mLs (0.5 mg total) by nebulization in the morning and at bedtime. For 1-2 weeks during flares. 120 mL 2   DULoxetine 40 MG CPEP Take 1 capsule (40 mg total) by mouth daily. 30 capsule 0   famotidine (PEPCID) 20 MG tablet TAKE 1 TABLET BY MOUTH TWICE A DAY (Patient not taking: Reported on 08/08/2023) 180 tablet 1   fluticasone furoate-vilanterol (BREO ELLIPTA) 200-25 MCG/ACT AEPB Inhale 1 puff into the lungs daily. Rinse mouth after each use. 60 each 5   montelukast (SINGULAIR) 10 MG tablet Take 1 tablet (10 mg total) by mouth at bedtime. 90 tablet 3   phentermine (ADIPEX-P) 37.5 MG tablet      Spacer/Aero-Holding Chambers (EQ SPACE CHAMBER ANTI-STATIC) DEVI Inhale into the lungs as directed.     TEZSPIRE 210 MG/1. SOAJ Inject into the skin.     Current Facility-Administered Medications  Medication Dose Route Frequency Provider Last Rate Last Admin   tezepelumab-ekko (TEZSPIRE) 210 MG/1. syringe 210 mg  210 mg Subcutaneous Q28 days Ellamae Sia, DO   210 mg at 10/12/23 1622     Psychiatric Specialty Exam: Review of Systems  Cardiovascular:  Negative for chest pain.  Neurological:  Negative for tremors.  Psychiatric/Behavioral:  The patient  is nervous/anxious.     There were no vitals taken for this visit.There is no height or weight on file to calculate BMI.  General Appearance: Casual  Eye Contact:  Fair  Speech:  Normal Rate  Volume:  Decreased  Mood: stressed   Affect: congruent  Thought Process:  Goal Directed  Orientation:  Full (Time, Place, and Person)  Thought Content:  Logical  Suicidal Thoughts:  No  Homicidal Thoughts:  No  Memory:  Immediate;   Fair  Judgement:  Fair  Insight:  Fair  Psychomotor Activity:  Decreased  Concentration:  Concentration: Fair  Recall:  Good  Fund of Knowledge:Good  Language: Fair  Akathisia:  No  Handed:   AIMS (if indicated):  not done  Assets:  Desire for Improvement Financial Resources/Insurance Housing  ADL's:  Intact  Cognition: WNL  Sleep:  Fair   Screenings: GAD-7    Flowsheet Row Office Visit from 07/03/2023 in [facility] Office Visit from [facility] Office Visit from 04/22/2020 in [facility]  Total GAD-7 Score 19 6 3       PHQ2-9    Flowsheet Row Office Visit from 07/03/2023 in [facility] Office Visit from 03/04/2023 in [facility] Office Visit from 01/25/2023 in [facility] Office Visit from 10[facility]25/2022 in [facility] Office Visit from 07/25/2022 in [facility]  PHQ-2 Total Score 4 3 3  0 2  PHQ-9 Total Score 21 12 15  -- 14      Flowsheet Row Office Visit from 03/04/2023 [facility] from 12/16/2022 in [facility] at [facility] ED from 09/05/2021 in [facility] Emergency Department at [facility]  C-SSRS RISK CATEGORY No Risk No Risk No Risk       Assessment and Plan: as follows  Prior  documentation reviewed    Major depressive disorder recurrent mild to moderate; manageable continue cymbalta     Generalized anxiety  disorder; stress due to husband not having job and his condition at times. Will increase celexa to 40mg  consider therapy  Relationship concerns : fluctuates , he is having appointment this week continue adding time for activities together,  increase cymbalta to 40mg    Fu 63m or earlier if needed  Collaboration of Care: Primary Care Provider AEB chart and notes reviewed  Patient/Guardian was advised Release of Information must be obtained prior to any record release in order to collaborate their care with an outside provider. Patient/Guardian was advised if they have not already done so to contact the registration department to sign all necessary forms in order for [place] to release information regarding their care.   Consent: Patient/Guardian gives verbal consent for treatment and assignment of benefits for services provided during this visit. Patient/Guardian expressed understanding and agreed to proceed.   Thresa Ross, MD 10/28/20243:10 PM Patient ID: [name], female   DOB: July 27, 1976, 47 y.o.   MRN: [id number]

## 2023-11-14 ENCOUNTER — Other Ambulatory Visit (HOSPITAL_COMMUNITY): Payer: Self-pay | Admitting: Psychiatry

## 2023-11-15 ENCOUNTER — Telehealth (HOSPITAL_COMMUNITY): Payer: BC Managed Care – PPO | Admitting: Psychiatry

## 2023-11-15 ENCOUNTER — Other Ambulatory Visit: Payer: Self-pay | Admitting: Allergy

## 2023-11-15 ENCOUNTER — Encounter (HOSPITAL_COMMUNITY): Payer: Self-pay | Admitting: Psychiatry

## 2023-11-15 DIAGNOSIS — F411 Generalized anxiety disorder: Secondary | ICD-10-CM

## 2023-11-15 DIAGNOSIS — F331 Major depressive disorder, recurrent, moderate: Secondary | ICD-10-CM | POA: Diagnosis not present

## 2023-11-15 NOTE — Progress Notes (Signed)
[facility] Follow up visit  Patient Identification: Erin Collins MRN:  [id number] Date of Evaluation:  11/15/2023 Referral Source: primary care Chief Complaint:   No chief complaint on file. Follow up depression, mood Visit Diagnosis:    ICD-10-CM   1. MDD (major depressive disorder), recurrent episode, moderate (HCC)  F33.1     2. GAD (generalized anxiety disorder)  F41.1     Virtual Visit via Video Note  I connected with Jennette Dubin Mordecai on 11/15/23 at 10:30 AM EST by a video enabled telemedicine application and verified that I am speaking with the correct person using two identifiers.  Location: Patient: parked car Provider: home office   I discussed the limitations of evaluation and management by telemedicine and the availability of in person appointments. The patient expressed understanding and agreed to proceed.      I discussed the assessment and treatment plan with the patient. The patient was provided an opportunity to ask questions and all were answered. The patient agreed with the plan and demonstrated an understanding of the instructions.   The patient was advised to call back or seek an in-person evaluation if the symptoms worsen or if the condition fails to improve as anticipated.  I provided 20 minutes of non-face-to-face time during this encounter.    History of Present Illness: Patient is a 47 years old currently married Caucasian female referred initially by primary care physician to establish care for possibility of depression, anxiety this is her second marriage she has 2 stepdaughters who are teenagers patient works full-time as a Careers information officer    On eval doing fair with cymbalta 40mg  Husband is also back to work and that helps He has bipolar [facility]  Some inattention and we discussed options and causes , she can plan testing but cautioned of med side effects  Aggravating factors; relationship concerns at times , and job stress has been  single for 20 years while living in [place] she states they are to keep near to her son till he turns age 77, sisters illness  Modifying factors family, mom, travelling   Duration more then 20 years  Severity fair   Past Psychiatric History: depression, anxiety  Previous Psychotropic Medications: Yes   Substance Abuse History in the last 12 months:  No.  Consequences of Substance Abuse: NA  Past Medical History:  Past Medical History:  Diagnosis Date   Anemia    Angio-edema    Asthma    Basal cell carcinoma ~ 1999   collarbone   Blood clot in vein ~ 2008   "left breast"    Complication of anesthesia    "I have trouble waking up afterwards" (12/18/2017)   Diarrhea 04/01/2019   Eczema    Family history of adverse reaction to anesthesia    "mom has trouble waking up afterwards" (12/18/2017)   Fluttering heart    "comes and goes" (12/18/2017)   GERD (gastroesophageal reflux disease)    Headache    "weekly" (12/18/2017)   History of kidney stones    Recurrent upper respiratory infection (URI)    Seizures (HCC) 1980 X 1   "bland stare"   Torticollis    Vertigo     Past Surgical History:  Procedure Laterality Date   ANKLE SURGERY Left 2023   [facility]   BASAL CELL CARCINOMA EXCISION Right ~ 1999   "collarbone"   DILATION AND CURETTAGE OF UTERUS  2005   LAPAROSCOPIC ENDOMETRIOSIS  FULGURATION  2005   NASAL SEPTOPLASTY W/ TURBINOPLASTY Bilateral 2014   SINOSCOPY     TONSILLECTOMY  1980   TUBAL LIGATION Right 2005   TYMPANOSTOMY TUBE PLACEMENT      Family History:  Family History  Problem Relation Age of Onset   Allergic rhinitis Mother    Asthma Mother     Social History:   Social History   Socioeconomic History   Marital status: Married    Spouse name: Not on file   Number of children: Not on file   Years of education: Not on file   Highest education level: Not on file  Occupational History   Not on file  Tobacco Use   Smoking  status: Never    Passive exposure: Never   Smokeless tobacco: Never  Vaping Use   Vaping status: Never Used  Substance and Sexual Activity   Alcohol use: Not Currently    Comment: 12/18/2017 "maybe 1/year on my birthday"  Rarely.   Drug use: No   Sexual activity: Yes    Partners: Male    Birth control/protection: None, OCP  Other Topics Concern   Not on file  Social History Narrative   Caffeine; large soda daily (20oz).    Education : HS some college.   Working:  Editor, commissioning.    Social Determinants of Health   Financial Resource Strain: Not on file  Food Insecurity: Low Risk  (08/16/2023)   Received from [facility]   Hunger Vital Sign    Worried About Running Out of Food in the Last Year: Never true    Ran Out of Food in the Last Year: Never true  Transportation Needs: No Transportation Needs (07/21/2021)   PRAPARE - Administrator, Civil Service (Medical): No    Lack of Transportation (Non-Medical): No  Physical Activity: Not on file  Stress: Not on file  Social Connections: Not on file     Allergies:   Allergies  Allergen Reactions   Amoxicillin Hives   Cefuroxime Axetil Hives   Hydrocodone Itching   Levofloxacin Other (See Comments)    tendonitis   Morphine Itching   Oxycodone-Acetaminophen Itching   Penicillins Hives    Has patient had a PCN reaction causing immediate rash, facial/tongue/throat swelling, SOB or lightheadedness with hypotension: Yes Has patient had a PCN reaction causing severe rash involving mucus membranes or skin necrosis: No Has patient had a PCN reaction that required hospitalization: No Has patient had a PCN reaction occurring within the last 10 years: No If all of the above answers are "NO", then may proceed with Cephalosporin use.    Propoxyphene Itching    Metabolic Disorder Labs: Lab Results  Component Value Date   HGBA1C 5.2 02/08/2023   No results found for: "PROLACTIN" No results found for: "CHOL", "TRIG",  "HDL", "CHOLHDL", "VLDL", "LDLCALC" No results found for: "TSH"  Therapeutic Level Labs: No results found for: "LITHIUM" No results found for: "CBMZ" No results found for: "VALPROATE"  Current Medications: Current Outpatient Medications  Medication Sig Dispense Refill   albuterol (PROVENTIL) (2.5 MG/3ML) 0.083% nebulizer solution Take 3 mLs (2.5 mg total) by nebulization every 4 (four) hours as needed for wheezing or shortness of breath (coughing fits). 75 mL 1   albuterol (VENTOLIN HFA) 108 (90 Base) MCG/ACT inhaler PLEASE SEE ATTACHED FOR DETAILED DIRECTIONS 6.7 each 1   azithromycin (ZITHROMAX Z-PAK) 250 MG tablet Take 2 tablets on day 1. Then 1 tablet days 2-5. 6 each 0  budesonide (PULMICORT) 0.5 MG/2ML nebulizer solution Take 2 mLs (0.5 mg total) by nebulization in the morning and at bedtime. For 1-2 weeks during flares. 120 mL 2   DULoxetine (CYMBALTA) 20 MG capsule TAKE 2 CAPSULES (40 MG TOTAL) BY MOUTH DAILY. 60 capsule 0   famotidine (PEPCID) 20 MG tablet TAKE 1 TABLET BY MOUTH TWICE A DAY (Patient not taking: Reported on 08/08/2023) 180 tablet 1   fluticasone furoate-vilanterol (BREO ELLIPTA) 200-25 MCG/ACT AEPB Inhale 1 puff into the lungs daily. Rinse mouth after each use. 60 each 5   montelukast (SINGULAIR) 10 MG tablet Take 1 tablet (10 mg total) by mouth at bedtime. 90 tablet 3   phentermine (ADIPEX-P) 37.5 MG tablet      Spacer/Aero-Holding Chambers (EQ SPACE CHAMBER ANTI-STATIC) DEVI Inhale into the lungs as directed.     TEZSPIRE 210 MG/1. SOAJ Inject into the skin.     Current Facility-Administered Medications  Medication Dose Route Frequency Provider Last Rate Last Admin   tezepelumab-ekko (TEZSPIRE) 210 MG/1. syringe 210 mg  210 mg Subcutaneous Q28 days Ellamae Sia, DO   210 mg at 10/12/23 1622     Psychiatric Specialty Exam: Review of Systems  Cardiovascular:  Negative for chest pain.  Neurological:  Negative for tremors.    There were no vitals taken  for this visit.There is no height or weight on file to calculate BMI.  General Appearance: Casual  Eye Contact:  Fair  Speech:  Normal Rate  Volume:  Decreased  Mood: better  Affect: congruent  Thought Process:  Goal Directed  Orientation:  Full (Time, Place, and Person)  Thought Content:  Logical  Suicidal Thoughts:  No  Homicidal Thoughts:  No  Memory:  Immediate;   Fair  Judgement:  Fair  Insight:  Fair  Psychomotor Activity:  Decreased  Concentration:  Concentration: Fair  Recall:  Good  Fund of Knowledge:Good  Language: Fair  Akathisia:  No  Handed:   AIMS (if indicated):  not done  Assets:  Desire for Improvement Financial Resources/Insurance Housing  ADL's:  Intact  Cognition: WNL  Sleep:  Fair   Screenings: GAD-7    Flowsheet Row Office Visit from 07/03/2023 in [facility] - A Dept Of [place]. [facility] Office Visit from 07/21/2021 in Center for [facility] Office Visit from 04/22/2020 in [facility]  Total GAD-7 Score 19 6 3       PHQ2-9    Flowsheet Row Office Visit from 07/03/2023 in [facility] - A Dept Of [place]. [facility] Office Visit from 03/04/2023 in [facility] Office Visit from 01/25/2023 in [facility] - A Dept Of [place]. [facility] Office Visit from 10/25/2022 in [facility] - A Dept Of [place]. [facility] Office Visit from 07/25/2022 in [facility] Internal Med [facility]tr - A Dept Of [place]. [facility]  PHQ-2 Total Score 4 3 3  0 2  PHQ-9 Total Score 21 12 15  -- 14      Flowsheet Row [facility] from 12/16/2022 in [facility] at [facility] ED from 09/05/2021 in [facility] Emergency Department at  [facility]  C-SSRS RISK CATEGORY No Risk No Risk No Risk  Assessment and Plan: as follows  Prior documentation reviewed    Major depressive disorder recurrent mild to moderate; doing better continue cymbalta   Generalized anxiety disorder; handling stress better continue cymbalta   Relationship concerns : fluctuates but some better now, continue work on coping skills  Fu 90m.   Collaboration of Care: Primary Care Provider AEB chart and notes reviewed  Patient/Guardian was advised Release of Information must be obtained prior to any record release in order to collaborate their care with an outside provider. Patient/Guardian was advised if they have not already done so to contact the registration department to sign all necessary forms in order for [place] to release information regarding their care.   Consent: Patient/Guardian gives verbal consent for treatment and assignment of benefits for services provided during this visit. Patient/Guardian expressed understanding and agreed to proceed.   Thresa Ross, MD 11/27/202410:32 AM Patient ID: [name], female   DOB: 1976/09/29, 47 y.o.   MRN: [id number]

## 2023-12-05 ENCOUNTER — Other Ambulatory Visit: Payer: Self-pay | Admitting: Allergy

## 2023-12-05 ENCOUNTER — Encounter: Payer: Self-pay | Admitting: Allergy

## 2023-12-05 ENCOUNTER — Ambulatory Visit: Payer: BC Managed Care – PPO | Admitting: Allergy

## 2023-12-05 VITALS — BP 106/70 | HR 93 | Temp 98.0°F | Resp 16 | Ht 64.0 in | Wt 195.0 lb

## 2023-12-05 DIAGNOSIS — J4550 Severe persistent asthma, uncomplicated: Secondary | ICD-10-CM

## 2023-12-05 DIAGNOSIS — L2089 Other atopic dermatitis: Secondary | ICD-10-CM

## 2023-12-05 DIAGNOSIS — J301 Allergic rhinitis due to pollen: Secondary | ICD-10-CM

## 2023-12-05 DIAGNOSIS — Z889 Allergy status to unspecified drugs, medicaments and biological substances status: Secondary | ICD-10-CM

## 2023-12-05 DIAGNOSIS — J3089 Other allergic rhinitis: Secondary | ICD-10-CM

## 2023-12-05 DIAGNOSIS — Z91038 Other insect allergy status: Secondary | ICD-10-CM

## 2023-12-05 DIAGNOSIS — K219 Gastro-esophageal reflux disease without esophagitis: Secondary | ICD-10-CM

## 2023-12-05 DIAGNOSIS — J3081 Allergic rhinitis due to animal (cat) (dog) hair and dander: Secondary | ICD-10-CM | POA: Diagnosis not present

## 2023-12-05 MED ORDER — FLUTICASONE FUROATE-VILANTEROL 100-25 MCG/ACT IN AEPB: 1.0000 | INHALATION_SPRAY | Freq: Every day | RESPIRATORY_TRACT | 5 refills | Status: DC

## 2023-12-05 MED ORDER — AIRSUPRA 90-80 MCG/ACT IN AERO
2.0000 | INHALATION_SPRAY | RESPIRATORY_TRACT | 2 refills | Status: DC | PRN
Start: 2023-12-05 — End: 2023-12-05

## 2023-12-05 NOTE — Progress Notes (Signed)
Follow Up Note  RE: Erin Collins MRN: [id number] DOB: [birth year] Date of Office Visit: 12/05/2023  Referring provider: Kathleen Lime, MD Primary care provider: Kathleen Lime, MD  Chief Complaint: Follow-up (Had her tezspire injection Saturday, does notice some sx when she's closer to her injection due date. )  History of Present Illness: I had the pleasure of seeing Erin Collins for a [facility]  on 12/05/2023. She is a 47 y.o. female, who is being followed for asthma on Tezspire, allergic rhinitis, multiple drug allergies, atopic dermatitis and GERD. Her previous allergy office visit was on 08/08/2023 with Dr. Selena Batten. Today is a regular follow up visit.  Discussed the use of AI scribe software for clinical note transcription with the patient, who gave verbal consent to proceed.  The patient, with a history of asthma, has been self-administering Tezspire injections at home every four weeks without any complications. However, she reports experiencing mild symptoms, including dyspnea on exertion and chest tightness, approximately one week prior to her next scheduled injection. These symptoms are not as severe as those experienced prior to starting the injections.  The patient also reports discontinuing her Breo inhaler about a month ago due to feeling well. Since discontinuing the inhaler, the patient has not noticed any worsening of symptoms per se.   In addition to asthma, the patient has allergies and is currently taking Singulair and Allegra. She uses Flonase and eye drops as needed. The patient is considering allergy shots in the spring due to worsening allergies with age.  The patient also reports a history of throat clearing, which an ENT specialist attributed to inflammation from constant throat clearing. The patient has tried to reduce throat clearing but finds it difficult as it has become a habit.  The patient also has dry skin, which she  manages moisturizers.  She has not needed to use hydrocortisone.  Lastly, the patient reports occasional reflux symptoms, which she manages with over-the-counter Pepcid as needed.     Assessment and Plan: Erin Collins is a 47 y.o. female with: Severe persistent asthma Past history - Did not tolerate Trelegy, Breztri no as effective. Flares in the spring and fall.  Interim history - Patient reports mild symptoms (shortness of breath with exertion, occasional tightness) one week prior to scheduled injections. Patient has discontinued Breo one month ago due to feeling well. Discussed potential need for Breo during spring and fall months due to historical flares and potential worsening of symptoms due to discontinuation of Breo. Today's spirometry was normal. Continue Tezspire injections every 4 weeks - self administering at home. Daily controller medication(s):   Monitor symptoms if you notice worsening asthma symptoms then please restart Breo 1 puff once a day and rinse mouth after each use. Will need to use Breo daily in the spring and fall months. Continue Singulair 10mg  daily. May use Airsupra rescue inhaler 2 puffs every 4 to 6 hours as needed for shortness of breath, chest tightness, coughing, and wheezing. Do not use more than 12 puffs in 24 hours. May use Airsupra rescue inhaler 2 puffs 5 to 15 minutes prior to strenuous physical activities. Rinse mouth after each use.  Coupon given. Sample given. Monitor frequency of use - if you need to use it more than twice per week on a consistent basis let us know.    Seasonal allergic rhinitis due to pollen Allergic rhinitis due to animal dander Allergic rhinitis due to dust mite Allergy  to cockroaches Past history - AIT for 10 years which helped. 2019 skin testing showed, positive to dust mites, cat, dog, grass and horse. 2 cats at home. 2022 bloodwork positive to dust mites, cat, dog, grass pollen. Borderline to cockroach, mouse and maple/box  elder tree pollen. Atrovent burns sinuses.  Interim history - Controlled with Singulair and Zyrtec. Flonase and eye drops used as needed.  Continue environmental control measures - especially regarding the cats. Continue Singulair (montelukast) 10mg  daily.  Use over the counter antihistamines such as Zyrtec (cetirizine), Claritin (loratadine), Allegra (fexofenadine), or Xyzal (levocetirizine) daily as needed. May take twice a day during allergy flares. May switch antihistamines every few months. Use Flonase (fluticasone) nasal spray 1 spray per nostril twice a day as needed for nasal congestion.  Nasal saline spray (i.e., Simply Saline) or nasal saline lavage (i.e., NeilMed) is recommended as needed and prior to medicated nasal sprays. May use refresh eye drops.  Consider allergy injections for long term control if above medications do not help the symptoms.   Multiple drug allergies Past history - reactions to amoxicillin, cefuroxime, hydrocodone, levofloxacin, morphine, oxycodone, and propoxyphene in the past.  Continue to avoid medications on your allergy list.  Consider penicillin skin testing/drug challenge in the future.   Other atopic dermatitis Past history - Eucrisa not effective.  Continue proper skin care measures. Use hydrocortisone 1% cream twice a day as needed for mild rash flares - okay to use on the face, neck, groin area. Do not use more than 1 week at a time.   Gastroesophageal reflux disease Continue lifestyle and dietary modifications. Continue famotidine 20mg  once a day.  Return in about 4 months (around 04/04/2024).  Meds ordered this encounter  Medications   fluticasone furoate-vilanterol (BREO ELLIPTA) 100-25 MCG/ACT AEPB    Sig: Inhale 1 puff into the lungs daily. Rinse mouth after each use.    Dispense:  30 each    Refill:  5   Albuterol-Budesonide (AIRSUPRA) 90-80 MCG/ACT AERO    Sig: Inhale 2 puffs into the lungs every 4 (four) hours as needed (coughing,  wheezing, chest tightness). Do not exceed 12 puffs in 24 hours.    Dispense:  10.7 g    Refill:  2    BIN: [id number], PCN: [id number], GRP: [id number], ID [id number]   Lab Orders  No laboratory test(s) ordered today    Diagnostics: Spirometry:  Tracings reviewed. Her effort: Good reproducible efforts. FVC: 3.35L FEV1: 2.48L, 85% predicted FEV1/FVC ratio: 74% Interpretation: Spirometry consistent with normal pattern.  Please see scanned spirometry results for details.  Medication List:  Current Outpatient Medications  Medication Sig Dispense Refill   albuterol (PROVENTIL) (2.5 MG/3ML) 0.083% nebulizer solution Take 3 mLs (2.5 mg total) by nebulization every 4 (four) hours as needed for wheezing or shortness of breath (coughing fits). 75 mL 1   albuterol (VENTOLIN HFA) 108 (90 Base) MCG/ACT inhaler PLEASE SEE ATTACHED FOR DETAILED DIRECTIONS 6.7 each 1   Albuterol-Budesonide (AIRSUPRA) 90-80 MCG/ACT AERO Inhale 2 puffs into the lungs every 4 (four) hours as needed (coughing, wheezing, chest tightness). Do not exceed 12 puffs in 24 hours. 10.7 g 2   DULoxetine (CYMBALTA) 20 MG capsule TAKE 2 CAPSULES (40 MG TOTAL) BY MOUTH DAILY. 60 capsule 0   famotidine (PEPCID) 20 MG tablet TAKE 1 TABLET BY MOUTH TWICE A DAY 180 tablet 1   fluticasone furoate-vilanterol (BREO ELLIPTA) 100-25 MCG/ACT AEPB Inhale 1 puff into the lungs daily. Rinse mouth after each use. 30 each  5   montelukast (SINGULAIR) 10 MG tablet Take 1 tablet (10 mg total) by mouth at bedtime. 90 tablet 3   Spacer/Aero-Holding Chambers (EQ SPACE CHAMBER ANTI-STATIC) DEVI Inhale into the lungs as directed.     TEZSPIRE 210 MG/1. SOAJ Inject into the skin.     Current Facility-Administered Medications  Medication Dose Route Frequency Provider Last Rate Last Admin   tezepelumab-ekko (TEZSPIRE) 210 MG/1. syringe 210 mg  210 mg Subcutaneous Q28 days Ellamae Sia, DO   210 mg at 10/12/23 1622   Allergies: Allergies  Allergen Reactions    Amoxicillin Hives   Cefuroxime Axetil Hives   Hydrocodone Itching   Levofloxacin Other (See Comments)    tendonitis   Morphine Itching   Oxycodone-Acetaminophen Itching   Penicillins Hives    Has patient had a PCN reaction causing immediate rash, facial/tongue/throat swelling, SOB or lightheadedness with hypotension: Yes Has patient had a PCN reaction causing severe rash involving mucus membranes or skin necrosis: No Has patient had a PCN reaction that required hospitalization: No Has patient had a PCN reaction occurring within the last 10 years: No If all of the above answers are "NO", then may proceed with Cephalosporin use.    Propoxyphene Itching   I reviewed her past medical history, social history, family history, and environmental history and no significant changes have been reported from her previous visit.  Review of Systems  Constitutional:  Negative for appetite change, chills, fever and unexpected weight change.  HENT:  Negative for congestion and postnasal drip.        Throat clearing  Eyes:  Negative for itching.  Respiratory:  Negative for cough, chest tightness, shortness of breath and wheezing.   Gastrointestinal:  Negative for abdominal pain.  Skin:  Negative for rash.  Allergic/Immunologic: Positive for environmental allergies. Negative for food allergies.  Neurological:  Negative for headaches.    Objective: BP 106/70   Pulse 93   Temp 98 F (36.7 C)   Resp 16   Ht 5\' 4"  (1.626 m)   Wt 195 lb (88.5 kg)   SpO2 96%   BMI 33.47 kg/m  Body mass index is 33.47 kg/m. Physical Exam Vitals and nursing note reviewed.  Constitutional:      Appearance: Normal appearance. She is well-developed.  HENT:     Head: Normocephalic and atraumatic.     Right Ear: Tympanic membrane and external ear normal.     Left Ear: Tympanic membrane and external ear normal.     Nose: Nose normal.     Mouth/Throat:     Mouth: Mucous membranes are moist.     Pharynx:  Oropharynx is clear.  Eyes:     Conjunctiva/sclera: Conjunctivae normal.  Cardiovascular:     Rate and Rhythm: Normal rate and regular rhythm.     Heart sounds: Normal heart sounds. No murmur heard. Pulmonary:     Effort: Pulmonary effort is normal.     Breath sounds: Normal breath sounds. No wheezing, rhonchi or rales.  Musculoskeletal:     Cervical back: Neck supple.  Skin:    General: Skin is warm.     Findings: No rash.  Neurological:     Mental Status: She is alert and oriented to person, place, and time.  Psychiatric:        Behavior: Behavior normal.   Previous notes and tests were reviewed. The plan was reviewed with the patient/family, and all questions/concerned were addressed.  It was my pleasure to see Erin Collins  today and participate in her care. Please feel free to contact me with any questions or concerns.  Sincerely,  Erin Mood, DO Allergy & Immunology  [facility] office: [phone] [facility] office: [phone]

## 2023-12-05 NOTE — Patient Instructions (Addendum)
Moderate persistent asthma Continue Tezspire injections every 4 weeks. Daily controller medication(s):   Monitor symptoms if you notice worsening asthma symptoms then please restart Breo 1 puff once a day and rinse mouth after each use. Will need to use Breo daily in the spring and fall months. Continue Singulair 10mg  daily. May use Airsupra rescue inhaler 2 puffs every 4 to 6 hours as needed for shortness of breath, chest tightness, coughing, and wheezing. Do not use more than 12 puffs in 24 hours. May use Airsupra rescue inhaler 2 puffs 5 to 15 minutes prior to strenuous physical activities. Rinse mouth after each use.  Coupon given. Sample given. Monitor frequency of use - if you need to use it more than twice per week on a consistent basis let us know.  Asthma control goals:  Full participation in all desired activities (may need albuterol before activity) Albuterol use two times or less a week on average (not counting use with activity) Cough interfering with sleep two times or less a month Oral steroids no more than once a year No hospitalizations    Seasonal and perennial allergic rhinoconjunctivitis 2022 bloodwork positive to dust mites, cat, dog, grass pollen. Borderline to cockroach, mouse and maple/box elder tree pollen.  Continue environmental control measures - especially regarding the cats. Continue Singulair (montelukast) 10mg  daily.  Use over the counter antihistamines such as Zyrtec (cetirizine), Claritin (loratadine), Allegra (fexofenadine), or Xyzal (levocetirizine) daily as needed. May take twice a day during allergy flares. May switch antihistamines every few months. Use Flonase (fluticasone) nasal spray 1 spray per nostril twice a day as needed for nasal congestion.  Nasal saline spray (i.e., Simply Saline) or nasal saline lavage (i.e., NeilMed) is recommended as needed and prior to medicated nasal sprays. May use refresh eye drops.  Consider allergy injections  for long term control if above medications do not help the symptoms.   Heartburn Continue lifestyle and dietary modifications. Continue famotidine 20mg  once a day.   Other atopic dermatitis Continue proper skin care measures. Use hydrocortisone 1% cream twice a day as needed for mild rash flares - okay to use on the face, neck, groin area. Do not use more than 1 week at a time.   Multiple drug allergies Continue to avoid medications on your allergy list.  Penicillin allergy: Consider penicillin allergy skin testing and in office drug challenge in the future.  Over 90% of people with history of penicillin allergy which occurred over 10 years ago are found to be non-allergic.  You must be off antihistamines for 3-5 days before. Must be in good health and not ill. No vaccines/injections/antibiotics within the past 7 days. Plan on being in the office for 2-3 hours and must bring in the drug you want to do the oral challenge for - will send in prescription to pick up a few days before. You must call to schedule an appointment and specify it's for a drug challenge.    Follow up in 4 months or sooner if needed.

## 2023-12-10 ENCOUNTER — Other Ambulatory Visit (HOSPITAL_COMMUNITY): Payer: Self-pay | Admitting: Psychiatry

## 2024-01-31 ENCOUNTER — Ambulatory Visit: Payer: BC Managed Care – PPO | Admitting: Student

## 2024-01-31 VITALS — BP 125/79 | HR 92 | Temp 98.0°F | Ht 64.0 in | Wt 197.5 lb

## 2024-01-31 DIAGNOSIS — E66811 Obesity, class 1: Secondary | ICD-10-CM

## 2024-01-31 DIAGNOSIS — E669 Obesity, unspecified: Secondary | ICD-10-CM

## 2024-01-31 DIAGNOSIS — Z713 Dietary counseling and surveillance: Secondary | ICD-10-CM

## 2024-01-31 DIAGNOSIS — Z6839 Body mass index (BMI) 39.0-39.9, adult: Secondary | ICD-10-CM

## 2024-01-31 MED ORDER — WEGOVY 0.25 MG/0.5ML ~~LOC~~ SOAJ: 0.2500 mg | SUBCUTANEOUS | 0 refills | Status: DC

## 2024-01-31 NOTE — Progress Notes (Signed)
CC: Weight loss  HPI:  [name] is a 48 y.o. female living with a history stated below and presents today for weight loss nasal discuss other chronic conditions. Please see problem based assessment and plan for additional details.  Past Medical History:  Diagnosis Date   Anemia    Angio-edema    Asthma    Basal cell carcinoma ~ 1999   collarbone   Blood clot in vein ~ 2008   "left breast"    Complication of anesthesia    "I have trouble waking up afterwards" (12/18/2017)   Diarrhea 04/01/2019   Eczema    Family history of adverse reaction to anesthesia    "mom has trouble waking up afterwards" (12/18/2017)   Fluttering heart    "comes and goes" (12/18/2017)   GERD (gastroesophageal reflux disease)    Headache    "weekly" (12/18/2017)   History of kidney stones    Recurrent upper respiratory infection (URI)    Seizures (HCC) 1980 X 1   "bland stare"   Torticollis    Vertigo     Current Outpatient Medications on File Prior to Visit  Medication Sig Dispense Refill   albuterol (PROVENTIL) (2.5 MG/3ML) 0.083% nebulizer solution Take 3 mLs (2.5 mg total) by nebulization every 4 (four) hours as needed for wheezing or shortness of breath (coughing fits). 75 mL 1   albuterol (VENTOLIN HFA) 108 (90 Base) MCG/ACT inhaler PLEASE SEE ATTACHED FOR DETAILED DIRECTIONS 6.7 each 1   Albuterol-Budesonide (AIRSUPRA) 90-80 MCG/ACT AERO Inhale 2 puffs into the lungs every 4 (four) hours as needed. 10.7 g 2   DULoxetine (CYMBALTA) 20 MG capsule TAKE 2 CAPSULES BY MOUTH EVERY DAY 180 capsule 0   famotidine (PEPCID) 20 MG tablet TAKE 1 TABLET BY MOUTH TWICE A DAY 180 tablet 1   fluticasone furoate-vilanterol (BREO ELLIPTA) 100-25 MCG/ACT AEPB Inhale 1 puff into the lungs daily. Rinse mouth after each use. 30 each 5   montelukast (SINGULAIR) 10 MG tablet Take 1 tablet (10 mg total) by mouth at bedtime. 90 tablet 3   Spacer/Aero-Holding Chambers (EQ SPACE CHAMBER ANTI-STATIC) DEVI  Inhale into the lungs as directed.     TEZSPIRE 210 MG/1. SOAJ Inject into the skin.     Current Facility-Administered Medications on File Prior to Visit  Medication Dose Route Frequency Provider Last Rate Last Admin   tezepelumab-ekko (TEZSPIRE) 210 MG/1. syringe 210 mg  210 mg Subcutaneous Q28 days Ellamae Sia, DO   210 mg at 10/12/23 1622    Family History  Problem Relation Age of Onset   Allergic rhinitis Mother    Asthma Mother     Social History   Socioeconomic History   Marital status: Married    Spouse name: Not on file   Number of children: Not on file   Years of education: Not on file   Highest education level: Not on file  Occupational History   Not on file  Tobacco Use   Smoking status: Never    Passive exposure: Never   Smokeless tobacco: Never  Vaping Use   Vaping status: Never Used  Substance and Sexual Activity   Alcohol use: Not Currently    Comment: 12/18/2017 "maybe 1/year on my birthday"  Rarely.   Drug use: No   Sexual activity: Yes    Partners: Male    Birth control/protection: None, OCP  Other Topics Concern   Not on file  Social History Narrative   Caffeine; large soda daily (  Arvella Merles).    Education : HS some college.   Working:  Editor, commissioning.    Social Drivers of Corporate investment banker Strain: Not on file  Food Insecurity: Low Risk  (08/16/2023)   Received from [facility]   Hunger Vital Sign    Worried About Running Out of Food in the Last Year: Never true    Ran Out of Food in the Last Year: Never true  Transportation Needs: No Transportation Needs (07/21/2021)   PRAPARE - Administrator, Civil Service (Medical): No    Lack of Transportation (Non-Medical): No  Physical Activity: Not on file  Stress: Not on file  Social Connections: Not on file  Intimate Partner Violence: Not on file    Review of Systems: ROS negative except for what is noted on the assessment and plan.  Vitals:   01/31/24 1534  BP: 125/79   Pulse: 92  Temp: 98 F (36.7 C)  TempSrc: Oral  SpO2: 98%  Weight: 197 lb 8 oz (89.6 kg)  Height: 5\' 4"  (1.626 m)    Physical Exam: Constitutional: well-appearing woman, sitting in chair, in no acute distress Cardiovascular: regular rate and rhythm, no m/r/g Pulmonary/Chest: normal work of breathing on room air, lungs clear to auscultation bilaterally MSK: normal bulk and tone Neurological: alert & oriented x 3, no focal deficit Skin: warm and dry Psych: normal mood and behavior  Assessment & Plan:   Obesity (BMI 30-39.9) This patient presented to the clinic to discuss weight loss options.  She has no other concerns at this time. Her BMI is 39.9 ,consistent with a class I obesity.  Patient reports she used to be on phentermine which helped her lost a lot of weight but could not afford the medications when she lost her job.  She is opened to discussions on other weight loss medical therapies and regimen.  Semaglutide options discussed with the patient as well as cost involved and the side effects.  Patient elected to try Wegovy at this time.  She agrees to follow-up with [place] monthly to let us know how she is doing on the medication. -Prescribe Wegovy 0.25 mg pen  -Follow-up in a month   Patient discussed with Dr. Lavonna Monarch, M.D [facility] Phone: [phone] Date 01/31/2024 Time 4:49 PM

## 2024-01-31 NOTE — Assessment & Plan Note (Signed)
This patient presented to the clinic to discuss weight loss options.  She has no other concerns at this time. Her BMI is 39.9 ,consistent with a class I obesity.  Patient reports she used to be on phentermine which helped her lost a lot of weight but could not afford the medications when she lost her job.  She is opened to discussions on other weight loss medical therapies and regimen.  Semaglutide options discussed with the patient as well as cost involved and the side effects.  Patient elected to try Wegovy at this time.  She agrees to follow-up with [place] monthly to let us know how she is doing on the medication. -Prescribe Wegovy 0.25 mg pen  -Follow-up in a month

## 2024-01-31 NOTE — Patient Instructions (Addendum)
Thank you, [name] for allowing [place] to provide your care today. Today we discussed your general health and you upcoming appointments.  I sent you wegovy to your pharmacy. Please let us know how you do .   I have ordered the following labs for you:  Lab Orders  No laboratory test(s) ordered today     Tests ordered today:    Referrals ordered today:   Referral Orders         Ambulatory referral to Sleep Studies       I have ordered the following medication/changed the following medications:   Stop the following medications: There are no discontinued medications.   Start the following medications: Meds ordered this encounter  Medications   Semaglutide-Weight Management (WEGOVY) 0.25 MG/0.5ML SOAJ    Sig: Inject 0.25 mg into the skin once a week.    Dispense:  2 mL    Refill:  0     Follow up:  1 month    Remember:   Should you have any questions or concerns please call the internal medicine clinic at [phone].    Kathleen Lime, M.D [facility]

## 2024-02-01 ENCOUNTER — Other Ambulatory Visit: Payer: Self-pay | Admitting: Student

## 2024-02-01 ENCOUNTER — Telehealth: Payer: Self-pay | Admitting: *Deleted

## 2024-02-01 MED ORDER — WEGOVY 0.25 MG/0.5ML ~~LOC~~ SOAJ
0.2500 mg | SUBCUTANEOUS | 0 refills | Status: DC
Start: 2024-02-01 — End: 2024-02-01

## 2024-02-01 NOTE — Telephone Encounter (Signed)
Call from patient was given a written prescription for [facility].  Her insurance does not cover.  Patient stated that she threw prescription away.   Explained to patient that a prescription has to be received by the Pharmacy to start the PA process.  Will ask Dr. Mickie Bail to send  prescription  to [facility] in [facility] to get process started.

## 2024-02-06 ENCOUNTER — Encounter: Payer: Self-pay | Admitting: Student

## 2024-02-08 NOTE — Progress Notes (Signed)
Internal Medicine Clinic Attending  Case discussed with the resident at the time of the visit.  We reviewed the resident's history and exam and pertinent patient test results.  I agree with the assessment, diagnosis, and plan of care documented in the resident's note. Visit was focused on weight management.  At future visits her hx of premenopausal osteoporosis should be readdressed and problem list updated generally.

## 2024-02-16 ENCOUNTER — Telehealth (HOSPITAL_COMMUNITY): Payer: BC Managed Care – PPO | Admitting: Psychiatry

## 2024-02-28 ENCOUNTER — Institutional Professional Consult (permissible substitution): Payer: BC Managed Care – PPO | Admitting: Neurology

## 2024-02-28 ENCOUNTER — Telehealth: Payer: Self-pay | Admitting: Student

## 2024-02-28 NOTE — Telephone Encounter (Signed)
 Copied from CRM [id number]. Topic: Referral - Question >> Feb 28, 2024  9:07 AM Maxwell Marion wrote: Reason for CRM: Patient would like to be referred to different neurology clinic. Says she had her appointment today (sleep consult at [facility]) at 0845 and when they got to her at 0850 after standing in line, they cancelled her appointment and would not see her. [place] Spaniel they told her their policy is a 5 minute grace period. She does want to go back and would like a different referral to be sent to a different group.   Spoke with the pt who verbalized it is ok to send her referral to another provider.  Pt is aware that her referral is being sent to [facility] and will f/u.

## 2024-03-01 ENCOUNTER — Telehealth: Payer: Self-pay | Admitting: *Deleted

## 2024-03-01 MED ORDER — TEZSPIRE 210 MG/1.91ML ~~LOC~~ SOAJ: 210.0000 mg | SUBCUTANEOUS | 11 refills | Status: AC

## 2024-03-01 NOTE — Telephone Encounter (Signed)
 Patient called regarding her tezspire. L/m that  I am working on auth had to send couple times to [facility] and will let her know when approval. Renewal rx sent to [facility]

## 2024-03-06 DIAGNOSIS — G4750 Parasomnia, unspecified: Secondary | ICD-10-CM | POA: Diagnosis not present

## 2024-03-06 DIAGNOSIS — E6609 Other obesity due to excess calories: Secondary | ICD-10-CM | POA: Diagnosis not present

## 2024-03-06 DIAGNOSIS — G4710 Hypersomnia, unspecified: Secondary | ICD-10-CM | POA: Diagnosis not present

## 2024-03-06 DIAGNOSIS — Z6832 Body mass index (BMI) 32.0-32.9, adult: Secondary | ICD-10-CM | POA: Diagnosis not present

## 2024-03-06 DIAGNOSIS — J45901 Unspecified asthma with (acute) exacerbation: Secondary | ICD-10-CM | POA: Diagnosis not present

## 2024-03-06 DIAGNOSIS — G2581 Restless legs syndrome: Secondary | ICD-10-CM | POA: Diagnosis not present

## 2024-03-25 DIAGNOSIS — R112 Nausea with vomiting, unspecified: Secondary | ICD-10-CM | POA: Diagnosis not present

## 2024-03-25 DIAGNOSIS — Z03818 Encounter for observation for suspected exposure to other biological agents ruled out: Secondary | ICD-10-CM | POA: Diagnosis not present

## 2024-03-25 DIAGNOSIS — J020 Streptococcal pharyngitis: Secondary | ICD-10-CM | POA: Diagnosis not present

## 2024-03-28 DIAGNOSIS — G4730 Sleep apnea, unspecified: Secondary | ICD-10-CM | POA: Diagnosis not present

## 2024-04-04 ENCOUNTER — Ambulatory Visit: Payer: BC Managed Care – PPO | Admitting: Allergy

## 2024-04-19 ENCOUNTER — Ambulatory Visit: Payer: Self-pay | Admitting: Student

## 2024-04-19 ENCOUNTER — Telehealth: Payer: Self-pay | Admitting: Student

## 2024-04-19 VITALS — BP 117/80 | HR 103 | Temp 98.4°F | Ht 64.0 in | Wt 183.0 lb

## 2024-04-19 DIAGNOSIS — Z6831 Body mass index (BMI) 31.0-31.9, adult: Secondary | ICD-10-CM | POA: Diagnosis not present

## 2024-04-19 DIAGNOSIS — J45901 Unspecified asthma with (acute) exacerbation: Secondary | ICD-10-CM | POA: Insufficient documentation

## 2024-04-19 DIAGNOSIS — G4733 Obstructive sleep apnea (adult) (pediatric): Secondary | ICD-10-CM | POA: Diagnosis not present

## 2024-04-19 DIAGNOSIS — E669 Obesity, unspecified: Secondary | ICD-10-CM

## 2024-04-19 MED ORDER — GUAIFENESIN-DM 100-10 MG/5ML PO SYRP: 5.0000 mL | ORAL_SOLUTION | ORAL | 0 refills | Status: AC | PRN

## 2024-04-19 MED ORDER — PREDNISONE 20 MG PO TABS: ORAL_TABLET | ORAL | 0 refills | Status: DC

## 2024-04-19 NOTE — Patient Instructions (Addendum)
 Thank you, [name] for allowing us  to provide your care today. Today we discussed   Your mild asthma exacerbation: - Please pick up the prednisone  and the robitussin DM - Use these medications for the next 5 days - Finish the course of azithromycin  you started at home.    Please monitor your peak flow at home. Here your peak flow was , which is great. If it drops, I'd like you to do a long nebulized treatment, and recheck, up to 3 times. Call us  or head to the ED if the symptoms do not improve.  I would also like you to call us  if the cough medication doesn't help at night.  Follow up with your allergists.   Preventive Measures:  Follow your asthma action plan, which should include instructions on daily management and how to handle worsening symptoms.  Avoid known triggers such as allergens, smoke, and respiratory infections.  Ensure proper inhaler technique and adherence to prescribed medications. Follow-Up:  Schedule a follow-up appointment with your healthcare provider after an exacerbation to review your asthma management plan and make any necessary adjustments to your treatment regimen  Referrals: -  New medications: -  I have ordered the following labs for you:  Lab Orders  No laboratory test(s) ordered today     I will call if any are abnormal. All of your labs can be accessed through "My Chart".   My Chart Access: [email or web address]  Please follow-up in: if symptoms dont improve    We look forward to seeing you next time. Please call our clinic at [phone] if you have any questions or concerns. The best time to call is Monday-Friday from 9am-4pm, but there is someone available 24/7. If after hours or the weekend, call the main hospital number and ask for the Internal Medicine Resident On-Call. If you need medication refills, please notify your pharmacy one week in advance and they will send us  a request.    Thank you for letting us  take part in your care. Wishing you the best!  Cathey Clunes, MD 04/19/2024, 10:57 AM [facility]

## 2024-04-19 NOTE — Assessment & Plan Note (Signed)
 Patient was previously prescribed starting dose of Wegovy . However, patient has persistent emesis for >5 days, presented to UC, where she was prescribed 8 mg Zofran  and this alleviated her symptoms. She stopped this medication, which is appropriate, and her symptoms have resolved. She has not required additional doses of Zofran  since.

## 2024-04-19 NOTE — Assessment & Plan Note (Addendum)
 Patient with severe asthma on biologic medication presenting with 1-week of coughing, rinorhea, and sputum production. Initiallly thought to be from husband who had URI ~one week ago. At the direction of her allergist, she started a Zpak she had at home, with improvement in the sputum. Main bothersome symptoms now in the nightly cough that is getting in the way of sleeping and shortness of breath with ambulation.   She was able to walk from the entrance of building. SPO2 96% and peak flow meter . She is currently speaking in full sentences with good respiratory effort and no wheezing on auscultation.  We reviewed precautionary symptoms that should prompt her to seek medical attention  Given severity of her asthma and multiple triggers, will treat her for a mild exacerbation with the addition of Prednisone  40 mg daily for 5 days and Robitussin DM to help with nighttime cough paroxysms.

## 2024-04-19 NOTE — Telephone Encounter (Signed)
 The patient has been checked in  Copied from CRM #[id number]. Topic: Appointments - Appointment Info/Confirmation >> Apr 19, 2024  9:48 AM Arlie Benedict B wrote: Patient/patient representative is calling for information regarding an appointment. Patient stated she is running late she is on [place] and will be there shortly

## 2024-04-19 NOTE — Progress Notes (Signed)
 Subjective:  CC: Shortness of breath  HPI:  [name] is a 48 y.o. female with a past medical history stated below and presents today for shortness of breath. Please see problem based assessment and plan for additional details.  Past Medical History:  Diagnosis Date   Anemia    Angio-edema    Asthma    Basal cell carcinoma ~ 1999   collarbone   Blood clot in vein ~ 2008   "left breast"    Complication of anesthesia    "I have trouble waking up afterwards" (12/18/2017)   Diarrhea 04/01/2019   Eczema    Family history of adverse reaction to anesthesia    "mom has trouble waking up afterwards" (12/18/2017)   Fluttering heart    "comes and goes" (12/18/2017)   GERD (gastroesophageal reflux disease)    Headache    "weekly" (12/18/2017)   History of kidney stones    Recurrent upper respiratory infection (URI)    Seizures (HCC) 1980 X 1   "bland stare"   Torticollis    Vertigo     Current Outpatient Medications on File Prior to Visit  Medication Sig Dispense Refill   albuterol  (PROVENTIL ) (2.5 MG/3ML) 0.083% nebulizer solution Take 3 mLs (2.5 mg total) by nebulization every 4 (four) hours as needed for wheezing or shortness of breath (coughing fits). 75 mL 1   albuterol  (VENTOLIN  HFA) 108 (90 Base) MCG/ACT inhaler PLEASE SEE ATTACHED FOR DETAILED DIRECTIONS 6.7 each 1   famotidine  (PEPCID ) 20 MG tablet TAKE 1 TABLET BY MOUTH TWICE A DAY 180 tablet 1   montelukast  (SINGULAIR ) 10 MG tablet Take 1 tablet (10 mg total) by mouth at bedtime. 90 tablet 3   Spacer/Aero-Holding Chambers (EQ SPACE CHAMBER ANTI-STATIC) DEVI Inhale into the lungs as directed.     TEZSPIRE  210 MG/1. SOAJ Inject 210 mg into the skin every 28 (twenty-eight) days. 1.91 mL 11   Current Facility-Administered Medications on File Prior to Visit  Medication Dose Route Frequency Provider Last Rate Last Admin   tezepelumab -ekko (TEZSPIRE ) 210 MG/1. syringe 210 mg  210 mg Subcutaneous Q28 days  Trudy Fusi, DO   210 mg at 10/12/23 1622    Family History  Problem Relation Age of Onset   Allergic rhinitis Mother    Asthma Mother     Social History   Socioeconomic History   Marital status: Married    Spouse name: Not on file   Number of children: Not on file   Years of education: Not on file   Highest education level: Some college, no degree  Occupational History   Not on file  Tobacco Use   Smoking status: Never    Passive exposure: Never   Smokeless tobacco: Never  Vaping Use   Vaping status: Never Used  Substance and Sexual Activity   Alcohol use: Not Currently    Comment: 12/18/2017 "maybe 1/year on my birthday"  Rarely.   Drug use: No   Sexual activity: Yes    Partners: Male    Birth control/protection: None, OCP  Other Topics Concern   Not on file  Social History Narrative   Caffeine; large soda daily (20oz).    Education : HS some college.   Working:  Editor, commissioning.    Social Drivers of Corporate investment banker Strain: Low Risk  (04/19/2024)   Overall Financial Resource Strain (CARDIA)    Difficulty of Paying Living Expenses: Not hard at all  Food Insecurity: No  Food Insecurity (04/19/2024)   Hunger Vital Sign    Worried About Running Out of Food in the Last Year: Never true    Ran Out of Food in the Last Year: Never true  Transportation Needs: No Transportation Needs (04/19/2024)   PRAPARE - Administrator, Civil Service (Medical): No    Lack of Transportation (Non-Medical): No  Physical Activity: Unknown (04/19/2024)   Exercise Vital Sign    Days of Exercise per Week: 0 days    Minutes of Exercise per Session: Not on file  Stress: Stress Concern Present (04/19/2024)   [employer] of Occupational Health - Occupational Stress Questionnaire    Feeling of Stress : Rather much  Social Connections: Moderately Isolated (04/19/2024)   Social Connection and Isolation Panel [NHANES]    Frequency of Communication with Friends and Family: Once a  week    Frequency of Social Gatherings with Friends and Family: Once a week    Attends Religious Services: 1 to 4 times per year    Active Member of [employer] or Organizations: No    Attends Engineer, structural: Not on file    Marital Status: Married  Catering manager Violence: Not on file    Review of Systems: ROS negative except for what is noted on the assessment and plan.  Objective:   Vitals:   04/19/24 1025  BP: 117/80  Pulse: (!) 103  Temp: 98.4 F (36.9 C)  TempSrc: Oral  SpO2: 96%  Weight: 183 lb (83 kg)  Height: 5\' 4"  (1.626 m)    Physical Exam: Constitutional: well-appearing woman sitting in mild distress HENT: normocephalic atraumatic, mucous membranes moist Neck: supple Cardiovascular: regular rate and rhythm, no m/r/g Pulmonary/Chest:speaking in full sentences, normal work of breathing on room air, lungs clear to auscultation bilaterally, no wheezing Abdominal: soft, non-tender MSK: normal bulk and tone Neurological: alert & oriented x 3, normal gait Skin: warm and dry Psych: Pleasant mood and affect       04/19/2024   10:25 AM  Depression screen PHQ 2/9  Decreased Interest 0  Down, Depressed, Hopeless 0  PHQ - 2 Score 0       07/03/2023   11:45 AM 07/21/2021    4:56 PM 04/23/2020    5:32 PM  GAD 7 : Generalized Anxiety Score  Nervous, Anxious, on Edge 2 1 0  Control/stop worrying 3 1 0  Worry too much - different things 3 1 0  Trouble relaxing 3 1 1   Restless 3 1 1   Easily annoyed or irritable 3 1 1   Afraid - awful might happen 2 0 0  Total GAD 7 Score 19 6 3   Anxiety Difficulty Somewhat difficult       Assessment & Plan:   Mild asthma exacerbation Patient with severe asthma on biologic medication presenting with 1-week of coughing, rinorhea, and sputum production. Initiallly thought to be from husband who had URI ~one week ago. At the direction of her allergist, she started a Zpak she had at home, with improvement in the sputum. Main  bothersome symptoms now in the nightly cough that is getting in the way of sleeping and shortness of breath with ambulation.   She was able to walk from the entrance of building. SPO2 96% and peak flow meter . She is currently speaking in full sentences with good respiratory effort and no wheezing on auscultation.  We reviewed precautionary symptoms that should prompt her to seek medical attention  Given severity of her  asthma and multiple triggers, will treat her for a mild exacerbation with the addition of Prednisone  40 mg daily for 5 days and Robitussin DM to help with nighttime cough paroxysms.  Obesity (BMI 30-39.9) Patient was previously prescribed starting dose of Wegovy . However, patient has persistent emesis for >5 days, presented to UC, where she was prescribed 8 mg Zofran  and this alleviated her symptoms. She stopped this medication, which is appropriate, and her symptoms have resolved. She has not required additional doses of Zofran  since.   Return if symptoms worsen or fail to improve.  Patient discussed with Dr. Warden Ha Garald Jumbo, MD [facility] Residency Program  04/19/2024, 5:10 PM

## 2024-04-24 NOTE — Progress Notes (Signed)
 Internal Medicine Clinic Attending  Case discussed with the resident at the time of the visit.  We reviewed the resident's history and exam and pertinent patient test results.  I agree with the assessment, diagnosis, and plan of care documented in the resident's note.

## 2024-05-01 DIAGNOSIS — G4750 Parasomnia, unspecified: Secondary | ICD-10-CM | POA: Diagnosis not present

## 2024-05-01 DIAGNOSIS — G2581 Restless legs syndrome: Secondary | ICD-10-CM | POA: Diagnosis not present

## 2024-05-01 DIAGNOSIS — G4733 Obstructive sleep apnea (adult) (pediatric): Secondary | ICD-10-CM | POA: Diagnosis not present

## 2024-05-01 DIAGNOSIS — J45901 Unspecified asthma with (acute) exacerbation: Secondary | ICD-10-CM | POA: Diagnosis not present

## 2024-05-01 DIAGNOSIS — Z6831 Body mass index (BMI) 31.0-31.9, adult: Secondary | ICD-10-CM | POA: Diagnosis not present

## 2024-05-09 ENCOUNTER — Other Ambulatory Visit: Payer: Self-pay

## 2024-05-09 ENCOUNTER — Ambulatory Visit: Admitting: Allergy

## 2024-05-09 ENCOUNTER — Encounter: Payer: Self-pay | Admitting: Allergy

## 2024-05-09 VITALS — BP 126/86 | HR 88 | Temp 98.0°F | Resp 18 | Ht 64.57 in | Wt 184.8 lb

## 2024-05-09 DIAGNOSIS — J301 Allergic rhinitis due to pollen: Secondary | ICD-10-CM

## 2024-05-09 DIAGNOSIS — J209 Acute bronchitis, unspecified: Secondary | ICD-10-CM

## 2024-05-09 DIAGNOSIS — K219 Gastro-esophageal reflux disease without esophagitis: Secondary | ICD-10-CM

## 2024-05-09 DIAGNOSIS — J3081 Allergic rhinitis due to animal (cat) (dog) hair and dander: Secondary | ICD-10-CM | POA: Diagnosis not present

## 2024-05-09 DIAGNOSIS — J4550 Severe persistent asthma, uncomplicated: Secondary | ICD-10-CM | POA: Diagnosis not present

## 2024-05-09 DIAGNOSIS — Z888 Allergy status to other drugs, medicaments and biological substances status: Secondary | ICD-10-CM

## 2024-05-09 DIAGNOSIS — Z889 Allergy status to unspecified drugs, medicaments and biological substances status: Secondary | ICD-10-CM

## 2024-05-09 DIAGNOSIS — L2089 Other atopic dermatitis: Secondary | ICD-10-CM

## 2024-05-09 DIAGNOSIS — J3089 Other allergic rhinitis: Secondary | ICD-10-CM

## 2024-05-09 DIAGNOSIS — Z91038 Other insect allergy status: Secondary | ICD-10-CM

## 2024-05-09 MED ORDER — DOXYCYCLINE HYCLATE 100 MG PO CAPS
100.0000 mg | ORAL_CAPSULE | Freq: Two times a day (BID) | ORAL | 0 refills | Status: DC
Start: 2024-05-09 — End: 2024-09-12

## 2024-05-09 NOTE — Progress Notes (Signed)
 Follow Up Note  RE: Erin Collins MRN: [id number] DOB: 29-May-1976 Date of Office Visit: 05/09/2024  Referring provider: Fay Hoop, MD Primary care provider: Fay Hoop, MD  Chief Complaint: follow up  History of Present Illness: I had the pleasure of seeing Erin Collins for a follow up visit at the [facility] on 05/10/2024. She is a 48 y.o. female, who is being followed for asthma on Tezspire , allergic rhinitis, multiple drug allergies, atopic dermatitis, GERD. Her previous allergy  office visit was on 12/05/2023 with Dr. Burdette Carolin. Today is a regular follow up visit.  Discussed the use of AI scribe software for clinical note transcription with the patient, who gave verbal consent to proceed.    Her asthma has been generally well-controlled, but she experienced a respiratory illness about three weeks ago. Initially, she had thick, yellow phlegm and a wet cough. She used a Z-Pak antibiotic from a previous prescription, which cleared up the symptoms temporarily. However, a week and a half later, she noticed a recurrence of a wet cough with some yellow phlegm, though not as severe as before. No fevers or chills were noted.  She continues to receive Tezspire  injections every four weeks and has been consistent with her schedule, although she missed her most recent dose by a day. She notes a significant improvement in her asthma symptoms since starting these injections compared to when she was on Spiriva  and Breo. She is currently taking Singulair , and Allegra regularly. She uses airsupra  as needed, primarily during her recent illness, and has not needed it much in the past two weeks. She also used her nebulizer with albuterol  solution during her illness. She only used Breo for a few days.   She has a history of allergies and reports using Allegra and Singulair  for management. Her allergies have been well-controlled with no significant symptoms except for some eye irritation, for  which she uses Xyzal and Refresh eye drops. She has a history of eczema, which has been exacerbated by frequent hand washing. She uses Eucerin for management but has not been consistent with application.  She experienced a severe reaction to Wegovy , resulting in vomiting for 24 hours, which she believes contributed to her recent respiratory issues. During a visit to urgent care, she was given Zofran  for nausea. Her PCP gave her prednisone  for asthma exacerbation.     Assessment and Plan: Erin Collins is a 48 y.o. female with: Severe persistent asthma Past history - Did not tolerate Trelegy, Breztri  no as effective. Flares in the spring and fall.  Interim history - Exacerbation likely due to viral infection and allergens. Symptoms include wet cough with yellow phlegm. Previous azithromycin  effective initially, symptoms recurred. 1 course of prednisone .  Today's spirometry was normal but not as good as previous.  Continue Tezspire  injections every 4 weeks. Daily controller medication(s):   Use Breo 100mcg 1 puff once a day and rinse mouth after each use for 1 month then stop. Will need to take daily in April/May and possibly fall.  Continue Singulair  10mg  daily. May use albuterol  rescue inhaler 2 puffs or nebulizer every 4 to 6 hours as needed for shortness of breath, chest tightness, coughing, and wheezing. May use albuterol  rescue inhaler 2 puffs 5 to 15 minutes prior to strenuous physical activities. Monitor frequency of use - if you need to use it more than twice per week on a consistent basis let us  know.  OR  May use Airsupra  rescue inhaler 2 puffs every  4 to 6 hours as needed for shortness of breath, chest tightness, coughing, and wheezing. Do not use more than 12 puffs in 24 hours. May use Airsupra  rescue inhaler 2 puffs 5 to 15 minutes prior to strenuous physical activities. Rinse mouth after each use.  Monitor frequency of use - if you need to use it more than twice per week on a consistent basis  let us  know.    Bronchitis Start doxycycline  100mg  twice a day x 10 days.   Seasonal allergic rhinitis due to pollen Allergic rhinitis due to animal dander Allergic rhinitis due to dust mite Allergy  to cockroaches Past history - AIT for 10 years which helped. 2019 skin testing showed, positive to dust mites, cat, dog, grass and horse. 2 cats at home. 2022 bloodwork positive to dust mites, cat, dog, grass pollen. Borderline to cockroach, mouse and maple/box elder tree pollen. Atrovent  burns sinuses.  Interim history - Controlled.  Continue environmental control measures - especially regarding the cats. Continue Singulair  (montelukast ) 10mg  daily.  Use over the counter antihistamines such as Zyrtec (cetirizine), Claritin (loratadine), Allegra (fexofenadine), or Xyzal (levocetirizine) daily as needed. May take twice a day during allergy  flares. May switch antihistamines every few months. Use Flonase (fluticasone ) nasal spray 1 spray per nostril twice a day as needed for nasal congestion.  Nasal saline spray (i.e., Simply Saline) or nasal saline lavage (i.e., NeilMed) is recommended as needed and prior to medicated nasal sprays. May use refresh eye drops.     Multiple drug allergies Past history - reactions to amoxicillin, cefuroxime, hydrocodone , levofloxacin, morphine, oxycodone, and propoxyphene in the past.  Continue to avoid medications on your allergy  list.  Recommend penicillin allergy  testing/drug challenge.   Other atopic dermatitis Past history - Eucrisa not effective.  Interim history - flares on hands.  Continue proper skin care measures. Use hydrocortisone 1% cream twice a day as needed for mild rash flares - okay to use on the face, neck, groin area. Do not use more than 1 week at a time. Epicerum - use twice a day as a moisturizer. Samples given.    Gastroesophageal reflux disease Continue lifestyle and dietary modifications. Continue famotidine  20mg  once a day.  Return  in about 4 months (around 09/09/2024).  Meds ordered this encounter  Medications   doxycycline  (VIBRAMYCIN ) 100 MG capsule    Sig: Take 1 capsule (100 mg total) by mouth 2 (two) times daily.    Dispense:  20 capsule    Refill:  0   Lab Orders  No laboratory test(s) ordered today    Diagnostics: Spirometry:  Tracings reviewed. Her effort: Good reproducible efforts. FVC: 2.98L FEV1: 2.26L, 79% predicted FEV1/FVC ratio: 78% Interpretation: Spirometry consistent with normal pattern.  Please see scanned spirometry results for details.  Results discussed with patient/family.  Medication List:  Current Outpatient Medications  Medication Sig Dispense Refill   albuterol  (PROVENTIL ) (2.5 MG/3ML) 0.083% nebulizer solution Take 3 mLs (2.5 mg total) by nebulization every 4 (four) hours as needed for wheezing or shortness of breath (coughing fits). 75 mL 1   albuterol  (VENTOLIN  HFA) 108 (90 Base) MCG/ACT inhaler PLEASE SEE ATTACHED FOR DETAILED DIRECTIONS 6.7 each 1   doxycycline  (VIBRAMYCIN ) 100 MG capsule Take 1 capsule (100 mg total) by mouth 2 (two) times daily. 20 capsule 0   famotidine  (PEPCID ) 20 MG tablet TAKE 1 TABLET BY MOUTH TWICE A DAY 180 tablet 1   guaiFENesin -dextromethorphan (ROBITUSSIN DM) 100-10 MG/5ML syrup Take 5 mLs by mouth every 4 (  four) hours as needed for cough. 118 mL 0   montelukast  (SINGULAIR ) 10 MG tablet Take 1 tablet (10 mg total) by mouth at bedtime. 90 tablet 3   predniSONE  (DELTASONE ) 20 MG tablet Take 2 tablets daily 10 tablet 0   Spacer/Aero-Holding Chambers (EQ SPACE CHAMBER ANTI-STATIC) DEVI Inhale into the lungs as directed.     TEZSPIRE  210 MG/1. SOAJ Inject 210 mg into the skin every 28 (twenty-eight) days. 1.91 mL 11   Current Facility-Administered Medications  Medication Dose Route Frequency Provider Last Rate Last Admin   tezepelumab -ekko (TEZSPIRE ) 210 MG/1. syringe 210 mg  210 mg Subcutaneous Q28 days Trudy Fusi, DO   210 mg at 10/12/23  1622   Allergies: Allergies  Allergen Reactions   Amoxicillin Hives   Cefuroxime Axetil Hives   Hydrocodone  Itching   Levofloxacin Other (See Comments)    tendonitis   Morphine Itching   Other Itching   Oxycodone-Acetaminophen  Itching   Penicillins Hives    Has patient had a PCN reaction causing immediate rash, facial/tongue/throat swelling, SOB or lightheadedness with hypotension: Yes Has patient had a PCN reaction causing severe rash involving mucus membranes or skin necrosis: No Has patient had a PCN reaction that required hospitalization: No Has patient had a PCN reaction occurring within the last 10 years: No If all of the above answers are "NO", then may proceed with Cephalosporin use.    Propoxyphene Itching   I reviewed her past medical history, social history, family history, and environmental history and no significant changes have been reported from her previous visit.  Review of Systems  Constitutional:  Negative for appetite change, chills, fever and unexpected weight change.  HENT:  Negative for congestion and postnasal drip.   Eyes:  Negative for itching.  Respiratory:  Positive for cough. Negative for chest tightness, shortness of breath and wheezing.   Gastrointestinal:  Negative for abdominal pain.  Skin:  Negative for rash.  Allergic/Immunologic: Positive for environmental allergies. Negative for food allergies.  Neurological:  Negative for headaches.    Objective: BP 126/86 (BP Location: Right Arm, Patient Position: Sitting, Cuff Size: Normal)   Pulse 88   Temp 98 F (36.7 C) (Temporal)   Resp 18   Ht 5' 4.57" (1.64 m)   Wt 184 lb 12.8 oz (83.8 kg)   SpO2 95%   BMI 31.17 kg/m  Body mass index is 31.17 kg/m. Physical Exam Vitals and nursing note reviewed.  Constitutional:      Appearance: Normal appearance. She is well-developed.  HENT:     Head: Normocephalic and atraumatic.     Right Ear: Tympanic membrane and external ear normal.     Left  Ear: Tympanic membrane and external ear normal.     Nose: Nose normal.     Mouth/Throat:     Mouth: Mucous membranes are moist.     Pharynx: Oropharynx is clear.  Eyes:     Conjunctiva/sclera: Conjunctivae normal.  Cardiovascular:     Rate and Rhythm: Normal rate and regular rhythm.     Heart sounds: Normal heart sounds. No murmur heard. Pulmonary:     Effort: Pulmonary effort is normal.     Breath sounds: Normal breath sounds. No wheezing, rhonchi or rales.  Musculoskeletal:     Cervical back: Neck supple.  Skin:    General: Skin is warm.     Findings: Rash present.     Comments: Dry skin on the hands b/l.  Neurological:     Mental  Status: She is alert and oriented to person, place, and time.  Psychiatric:        Behavior: Behavior normal.   Previous notes and tests were reviewed. The plan was reviewed with the patient/family, and all questions/concerned were addressed.  It was my pleasure to see Erin Collins today and participate in her care. Please feel free to contact me with any questions or concerns.  Sincerely,  Eudelia Hero, DO Allergy  & Immunology  [facility]  [place] office: [phone] [facility] office: [phone]

## 2024-05-09 NOTE — Patient Instructions (Addendum)
 Bronchitis Start doxycycline  100mg  twice a day x 10 days.   Moderate persistent asthma Continue Tezspire  injections every 4 weeks. Daily controller medication(s):   Use Breo 100mcg 1 puff once a day and rinse mouth after each use for 1 month then stop. Continue Singulair  10mg  daily. May use albuterol  rescue inhaler 2 puffs or nebulizer every 4 to 6 hours as needed for shortness of breath, chest tightness, coughing, and wheezing. May use albuterol  rescue inhaler 2 puffs 5 to 15 minutes prior to strenuous physical activities. Monitor frequency of use - if you need to use it more than twice per week on a consistent basis let us  know.  OR  May use Airsupra  rescue inhaler 2 puffs every 4 to 6 hours as needed for shortness of breath, chest tightness, coughing, and wheezing. Do not use more than 12 puffs in 24 hours. May use Airsupra  rescue inhaler 2 puffs 5 to 15 minutes prior to strenuous physical activities. Rinse mouth after each use.  Monitor frequency of use - if you need to use it more than twice per week on a consistent basis let us  know.  Asthma control goals:  Full participation in all desired activities (may need albuterol  before activity) Albuterol  use two times or less a week on average (not counting use with activity) Cough interfering with sleep two times or less a month Oral steroids no more than once a year No hospitalizations    Seasonal and perennial allergic rhinoconjunctivitis 2022 bloodwork positive to dust mites, cat, dog, grass pollen. Borderline to cockroach, mouse and maple/box elder tree pollen.  Continue environmental control measures - especially regarding the cats. Continue Singulair  (montelukast ) 10mg  daily.  Use over the counter antihistamines such as Zyrtec (cetirizine), Claritin (loratadine), Allegra (fexofenadine), or Xyzal (levocetirizine) daily as needed. May take twice a day during allergy  flares. May switch antihistamines every few months. Use Flonase  (fluticasone ) nasal spray 1 spray per nostril twice a day as needed for nasal congestion.  Nasal saline spray (i.e., Simply Saline) or nasal saline lavage (i.e., NeilMed) is recommended as needed and prior to medicated nasal sprays. May use refresh eye drops.  Consider allergy  injections for long term control if above medications do not help the symptoms.   Heartburn Continue lifestyle and dietary modifications. Continue famotidine  20mg  once a day.   Other atopic dermatitis Continue proper skin care measures. Use hydrocortisone 1% cream twice a day as needed for mild rash flares - okay to use on the face, neck, groin area. Do not use more than 1 week at a time. Epicerum - use twice a day as a moisturizer. Samples given.    Multiple drug allergies Continue to avoid medications on your allergy  list.  Recommend penicillin allergy  testing/drug challenge.  Penicillin allergy : Consider penicillin allergy  skin testing and in office drug challenge in the future.  Over 90% of people with history of penicillin allergy  which occurred over 10 years ago are found to be non-allergic.  You must be off antihistamines for 3-5 days before. Must be in good health and not ill. No vaccines/injections/antibiotics within the past 7 days. Plan on being in the office for 2-3 hours and must bring in the drug you want to do the oral challenge for - will send in prescription to pick up a few days before. You must call to schedule an appointment and specify it's for a drug challenge.    Follow up in 4 months or sooner if needed.

## 2024-05-10 ENCOUNTER — Encounter: Payer: Self-pay | Admitting: Allergy

## 2024-05-31 ENCOUNTER — Ambulatory Visit: Payer: Self-pay

## 2024-05-31 NOTE — Telephone Encounter (Addendum)
 FYI Only or Action Required?: FYI only for provider  Patient was last seen in primary care on 04/19/2024 by Cathey Clunes, MD. Called Nurse Triage reporting Hand Injury. Symptoms began several weeks ago. Interventions attempted: OTC medications: Tylenol  and Ice/heat application. Symptoms are: unchanged.  Triage Disposition: See Physician Within 24 Hours  Patient/caregiver understands and will follow disposition?: Yes- no appts avail this week. Pt going to [facility]     Copied from CRM [id number]. Topic: Clinical - Red Word Triage >> May 31, 2024 11:40 AM Carrielelia G wrote: [facility] that prompted transfer to Nurse Triage: Jammed hand 3 weeks ago, still bruised. Painful and swollen. Reason for Disposition  Large swelling or bruise (> 2 inches or 5 cm)  Answer Assessment - Initial Assessment Questions 1. MECHANISM: How did the injury happen?     LeftJammed hand- was wrestling with husband tried to block him, and jammed ring and index finger.   2. ONSET: When did the injury happen? (Minutes or hours ago)      3 weeks ago  3. APPEARANCE of INJURY: What does the injury look like?      Bruising on ring, inde, and middle. Knot on corner side of knuckle  4. SEVERITY: Can you use the hand normally? Can you bend your fingers into a ball and then fully open them?     Yes, can still bend and open okay, but has pain.   5. SIZE: For cuts, bruises, or swelling, ask: How large is it? (e.g., inches or centimeters;  entire hand or wrist)      Bruising on ring finger  6. PAIN: Is there pain? If Yes, ask: How bad is the pain?  (Scale 1-10; or mild, moderate, severe)     3/10 pain, worse at night after working on computer  7. TETANUS: For any breaks in the skin, ask: When was the last tetanus booster?     *No Answer* 8. OTHER SYMPTOMS: Do you have any other symptoms?      No  9. PREGNANCY: Is there any chance you are pregnant? When was your last menstrual period?     *No  Answer*  Protocols used: Hand and Wrist Injury-A-AH

## 2024-06-01 DIAGNOSIS — S63635A Sprain of interphalangeal joint of left ring finger, initial encounter: Secondary | ICD-10-CM | POA: Diagnosis not present

## 2024-06-06 ENCOUNTER — Other Ambulatory Visit: Payer: Self-pay | Admitting: Allergy

## 2024-08-28 ENCOUNTER — Other Ambulatory Visit: Payer: Self-pay

## 2024-08-28 ENCOUNTER — Ambulatory Visit: Payer: Self-pay

## 2024-08-28 ENCOUNTER — Ambulatory Visit

## 2024-08-28 VITALS — BP 133/64 | HR 82 | Temp 98.1°F | Ht 64.0 in | Wt 188.2 lb

## 2024-08-28 DIAGNOSIS — F320 Major depressive disorder, single episode, mild: Secondary | ICD-10-CM | POA: Diagnosis not present

## 2024-08-28 DIAGNOSIS — N951 Menopausal and female climacteric states: Secondary | ICD-10-CM | POA: Diagnosis not present

## 2024-08-28 DIAGNOSIS — K649 Unspecified hemorrhoids: Secondary | ICD-10-CM

## 2024-08-28 DIAGNOSIS — R109 Unspecified abdominal pain: Secondary | ICD-10-CM

## 2024-08-28 DIAGNOSIS — M25572 Pain in left ankle and joints of left foot: Secondary | ICD-10-CM

## 2024-08-28 MED ORDER — BUPROPION HCL ER (XL) 150 MG PO TB24: 150.0000 mg | ORAL_TABLET | ORAL | 2 refills | Status: DC

## 2024-08-28 NOTE — Assessment & Plan Note (Signed)
 Patient has been feeling stressed with work and with her family, specifically her husband and 2 stepdaughters.  She has tried sertraline  and citalopram  and buspirone  in the past but stopped taking due to side effects.  She has a friend that has had improvement with taking Wellbutrin  and patient prefers to try that.  Plan to start bupropion  150 mg once at bedtime.  Will reassess in 3 to 4 weeks to see if symptoms improving.  Consider referring to behavioral health.

## 2024-08-28 NOTE — Patient Instructions (Addendum)
 It was wonderful seeing you today!   Things we talked about today.SABRASABRA  1) Call your foot and ankle specialist and try to schedule an appointment for your foot tendon pain  2) Start taking buproprion every morning  3) Your right sided back pain and urine sample, I will only call you if there is a concern for infection   4) Getting your records from your GI Doctor in [place]  Please call if any of your symptoms worsen before your follow up appointment   If you have any questions please feel free to the call the clinic at anytime at [phone].  Have a blessed day,  Dr. Charmayne

## 2024-08-28 NOTE — Progress Notes (Signed)
 Established Patient Office Visit  Subjective   Patient ID: Erin Collins, female    DOB: 09-26-1976  Age: 48 y.o. MRN: [id number]  Patient here with several concerns, she is most bothered by her new heel pain and her perimenopausal symptoms.  Depression             Objective:     BP 133/64 (BP Location: Right Arm, Patient Position: Sitting, Cuff Size: Large)   Pulse 82   Temp 98.1 F (36.7 C) (Oral)   Ht 5' 4 (1.626 m)   Wt 188 lb 3.2 oz (85.4 kg)   SpO2 98%   BMI 32.30 kg/m    Physical Exam Constitutional:      Appearance: Normal appearance. She is not ill-appearing.  HENT:     Head: Normocephalic and atraumatic.     Nose: Nose normal.     Mouth/Throat:     Pharynx: Oropharynx is clear.  Eyes:     Conjunctiva/sclera: Conjunctivae normal.  Pulmonary:     Effort: Pulmonary effort is normal.  Abdominal:     Tenderness: There is no right CVA tenderness or left CVA tenderness.  Musculoskeletal:        General: Tenderness present.     Comments: Left ankle tenderness to palpation along lateral ankle as well as Achilles tendon insertion over previous surgical scar, progression of tenderness with passive dorsiflexion Left plantar surface above plantar fascia insertion tenderness to palpation  Skin:    General: Skin is warm.  Neurological:     General: No focal deficit present.     Mental Status: She is alert and oriented to person, place, and time.      No results found for any visits on 08/28/24.    The ASCVD Risk score (Arnett DK, et al., 2019) failed to calculate for the following reasons:   Cannot find a previous HDL lab   Cannot find a previous total cholesterol lab    Assessment & Plan:   Assessment & Plan Right flank pain Patient has had pyelonephritis in the past and she says that it the feeling is similar.  Denies any pain, itching or burning with urination.  Exam negative for CVA tenderness on right and left.  On palpation of right flank,  patient does not have superficial tenderness but explains that the pain is deeper.  Systemic signs of infection negative.  Vitals normal today.  Plan to check a urine today, will assess need for treatment based on results.  Otherwise reassess symptoms at next visit. Orders:   Urinalysis, Reflex Microscopic  Current mild episode of major depressive disorder without prior episode [facility]) Patient has been feeling stressed with work and with her family, specifically her husband and 2 stepdaughters.  She has tried sertraline  and citalopram  and buspirone  in the past but stopped taking due to side effects.  She has a friend that has had improvement with taking Wellbutrin  and patient prefers to try that.  Plan to start bupropion  150 mg once at bedtime.  Will reassess in 3 to 4 weeks to see if symptoms improving.  Consider referring to behavioral health. Perimenopausal symptoms Has been having hot flashes and chills for the last 6 to 12 months.  She is also experiencing significant moodiness and short temper.  Denies symptoms of vaginal atrophy.  This is significantly affecting her life, she does not feel like herself and feels like it is affecting her marriage.  Patient brought up hormone replacement therapy and potential referral to  a gynecologist for this.  We discussed that hormone replacement therapy is not indicated for women that are still having periods.  Discussed that this is a difficult problem to treat.  Patient understands and is willing to try bupropion  to see if this helped her symptoms.  Orders:   buPROPion  (WELLBUTRIN  XL) 150 MG 24 hr tablet; Take 1 tablet (150 mg total) by mouth every morning.  Hemorrhoids, unspecified hemorrhoid type Patient lived in [place]  around 2018 where she was seen by a gastroenterologist and says she was diagnosed with internal hemorrhoids.  Patient has normal bowel movements and denies pain with defecation.  Patient does notice blood in her stool periodically, most  frequently when she first wakes up.  Patient is going to gather her records from her old GI doctor for us  to discuss at the next visit.  We may be able to manage her GI symptoms at this clinic, can follow-up at next visit and reassess need to refer to GI. Left ankle pain, unspecified chronicity Patient had surgery 2 years ago in [place] with Dr. 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  After the surgery she completed PT for several months.  The pain that she had before the surgery is no longer present.  Now she has pain at the Achilles tendon insertion on palpation as well as along the lateral aspect of the ankle.  Patient also experiences a tugging on her posterior lower leg when she walks and her dorsiflexion mobility is limited. For the last 3 weeks patient has a local, pinpoint pain that is only present when she walks.  The pain is not worse at a particular time of day it is the same intensity of pain whenever she is walking on it.  It feels like she is stepping on a rock or a Lego when she applies pressure, it is worsened when she wears tight shoes such as tennis shoes.  On exam, it appears the pain is localized to over the insertion of the plantar fascia.  I wonder if patient has developed a bone spur at this tendon insertion similar to her Achilles tendon.  Recommended patient get in touch with her previous orthopedic doctor for workup and management.  I believe the patient would also benefit from reengaging in physical therapy.  Plan to have patient reach out to Dr. Silva to reestablish care with their outpatient physical therapy as well as be evaluated for calcaneal pain.  Follow-up at next visit to make sure that patient has been able to get in touch with Dr. Silva.     Return in about 4 weeks (around 09/25/2024) for fu 3-4 weeks for buproprion and GI sxs.    Viktoria King, DO

## 2024-08-29 ENCOUNTER — Ambulatory Visit: Payer: Self-pay

## 2024-08-29 LAB — URINALYSIS, ROUTINE W REFLEX MICROSCOPIC
Bilirubin, UA: NEGATIVE
Glucose, UA: NEGATIVE
Ketones, UA: NEGATIVE
Leukocytes,UA: NEGATIVE
Nitrite, UA: NEGATIVE
Protein,UA: NEGATIVE
RBC, UA: NEGATIVE
Specific Gravity, UA: 1.026 (ref 1.005–1.030)
Urobilinogen, Ur: 0.2 mg/dL (ref 0.2–1.0)
pH, UA: 5.5 (ref 5.0–7.5)

## 2024-09-10 ENCOUNTER — Ambulatory Visit: Admitting: Allergy

## 2024-09-12 ENCOUNTER — Ambulatory Visit (INDEPENDENT_AMBULATORY_CARE_PROVIDER_SITE_OTHER): Admitting: Allergy

## 2024-09-12 ENCOUNTER — Encounter: Payer: Self-pay | Admitting: Allergy

## 2024-09-12 ENCOUNTER — Other Ambulatory Visit: Payer: Self-pay

## 2024-09-12 VITALS — BP 112/78 | HR 88 | Temp 98.8°F | Resp 16 | Ht 64.0 in | Wt 185.8 lb

## 2024-09-12 DIAGNOSIS — L2089 Other atopic dermatitis: Secondary | ICD-10-CM

## 2024-09-12 DIAGNOSIS — J3081 Allergic rhinitis due to animal (cat) (dog) hair and dander: Secondary | ICD-10-CM

## 2024-09-12 DIAGNOSIS — K219 Gastro-esophageal reflux disease without esophagitis: Secondary | ICD-10-CM

## 2024-09-12 DIAGNOSIS — Z889 Allergy status to unspecified drugs, medicaments and biological substances status: Secondary | ICD-10-CM

## 2024-09-12 DIAGNOSIS — J4550 Severe persistent asthma, uncomplicated: Secondary | ICD-10-CM | POA: Diagnosis not present

## 2024-09-12 DIAGNOSIS — J301 Allergic rhinitis due to pollen: Secondary | ICD-10-CM

## 2024-09-12 DIAGNOSIS — Z91038 Other insect allergy status: Secondary | ICD-10-CM

## 2024-09-12 DIAGNOSIS — J3089 Other allergic rhinitis: Secondary | ICD-10-CM

## 2024-09-12 DIAGNOSIS — Z880 Allergy status to penicillin: Secondary | ICD-10-CM

## 2024-09-12 MED ORDER — MONTELUKAST SODIUM 10 MG PO TABS: 10.0000 mg | ORAL_TABLET | Freq: Every day | ORAL | 3 refills | Status: AC

## 2024-09-12 MED ORDER — FLUTICASONE FUROATE-VILANTEROL 100-25 MCG/ACT IN AEPB: 1.0000 | INHALATION_SPRAY | Freq: Every day | RESPIRATORY_TRACT | 5 refills | Status: AC

## 2024-09-12 MED ORDER — AIRSUPRA 90-80 MCG/ACT IN AERO: 2.0000 | INHALATION_SPRAY | RESPIRATORY_TRACT | 2 refills | Status: DC | PRN

## 2024-09-12 NOTE — Progress Notes (Signed)
 Follow Up Note  RE: Erin Collins MRN: [id number] DOB: [birth year] Date of Office Visit: 09/12/2024  Referring provider: Renne Homans, MD Primary care provider: Renne Homans, MD  Chief Complaint: Follow-up, Allergic Rhinitis , and Asthma  History of Present Illness: I had the pleasure of seeing Erin Collins for a follow up visit at the [facility] on 09/12/2024. She is a 48 y.o. female, who is being followed for asthma on Tezspire , allergic rhinitis, multiple drug allergies, atopic dermatitis and GERD. Her previous allergy  office visit was on 05/09/2024 with Dr. Luke. Today is a regular follow up visit.  Discussed the use of AI scribe software for clinical note transcription with the patient, who gave verbal consent to proceed.    Asthma is well-controlled with minimal use of her rescue inhaler, approximately three times a week at most and only once a day, primarily due to temperature changes. No recent asthma attacks or wheezing. She uses Tezpire every four weeks, which is effective, though she experiences chest tightness about a week before the next dose. She has stopped using Breo and has been working outside a lot. She requires a refill for her misplaced rescue inhaler.  Her allergies are manageable with Allegra and montelukast , though she experiences increased sneezing when working outside. Despite having six indoor cats, she does not have significant allergic reactions. She uses air purifiers in all rooms and cleans regularly. Recently, she had a severe headache with light sensitivity, which resolved after clearing nasal congestion. She uses Flonase and eye drops as needed.  She takes omeprazole for heartburn as needed, occasionally using Pepcid  for severe symptoms. Her skin condition is stable with no recent eczema flare-ups. She uses Eucerin lotion to manage dryness from frequent hand washing, a habit developed after a past MRSA infection in her nose.  She recently  started taking a generic form of Wellbutrin  for perimenopausal symptoms, specifically mood swings, but has not yet noticed a significant change. Her husband perceives some improvement. She has been on Singulair  for a long time without mood changes, but over-the-counter medications like Sudafed or Benadryl negatively affect her mood.     Assessment and Plan: Najee is a 48 y.o. female with: Severe persistent asthma Past history - Did not tolerate Trelegy, Breztri  no as effective. Flares in the spring and fall.  Interim history - doing well but noticed some chest tightness when due for Tezspire  injections. Stopped Breo completely. Uses albuterol  at most 3 times per week.  Today's spirometry was normal but not as good as previous.  Continue Tezspire  injections every 4 weeks. Daily controller medication(s):   Use Breo 100mcg 1 puff once a day and rinse mouth after each use. May need to use this daily in the fall and April/May months. Continue Singulair  10mg  daily. May use Airsupra  rescue inhaler 2 puffs every 4 to 6 hours as needed for shortness of breath, chest tightness, coughing, and wheezing. Do not use more than 12 puffs in 24 hours. May use Airsupra  rescue inhaler 2 puffs 5 to 15 minutes prior to strenuous physical activities. Rinse mouth after each use.  Make sure they run the coupon code.  Monitor frequency of use - if you need to use it more than twice per week on a consistent basis let us  know.     Seasonal allergic rhinitis due to pollen Allergic rhinitis due to animal dander Allergic rhinitis due to dust mite Allergy  to cockroaches Past history - AIT for 10 years  which helped. 2019 skin testing positive to dust mites, cat, dog, grass and horse. 2022 bloodwork positive to dust mites, cat, dog, grass pollen. Borderline to cockroach, mouse and maple/box elder tree pollen. Atrovent  burns sinuses. 9 cats at home.  Interim history - Flares after being outdoors for awhile.  Continue  environmental control measures - especially regarding the cats. Continue Singulair  (montelukast ) 10mg  daily.  Use over the counter antihistamines such as Zyrtec (cetirizine), Claritin (loratadine), Allegra (fexofenadine), or Xyzal (levocetirizine) daily as needed. May take twice a day during allergy  flares. May switch antihistamines every few months. Use Flonase (fluticasone ) nasal spray 1-2 sprays per nostril once a day as needed for nasal congestion.  Nasal saline spray (i.e., Simply Saline) or nasal saline lavage (i.e., NeilMed) is recommended as needed and prior to medicated nasal sprays. May use refresh eye drops.  Consider allergy  injections for long term control if above medications do not help the symptoms.    Multiple drug allergies Penicillin allergy  Past history - reactions to amoxicillin, cefuroxime, hydrocodone , levofloxacin, morphine, oxycodone, and propoxyphene in the past.  Continue to avoid medications on your allergy  list.  Recommend penicillin allergy  testing/drug challenge.   Other atopic dermatitis Past history - Eucrisa not effective.  Interim history - flares on hands usually. Continue proper skin care measures. Use hydrocortisone 1% cream twice a day as needed for mild rash flares - okay to use on the face, neck, groin area. Do not use more than 1 week at a time.   Gastroesophageal reflux disease Stable.  Continue lifestyle and dietary modifications. Continue omeprazole and Pepcid  as before.  Return in about 6 months (around 03/12/2025).  Meds ordered this encounter  Medications   fluticasone  furoate-vilanterol (BREO ELLIPTA ) 100-25 MCG/ACT AEPB    Sig: Inhale 1 puff into the lungs daily. Rinse mouth after each use.    Dispense:  60 each    Refill:  5   Albuterol -Budesonide  (AIRSUPRA ) 90-80 MCG/ACT AERO    Sig: Inhale 2 puffs into the lungs every 4 (four) hours as needed (coughing, wheezing, chest tightness). Do not exceed 12 puffs in 24 hours.    Dispense:   10.7 g    Refill:  2    BIN: [id number], PCN: [id number], GRP: [id number], ID [id number]   montelukast  (SINGULAIR ) 10 MG tablet    Sig: Take 1 tablet (10 mg total) by mouth at bedtime.    Dispense:  90 tablet    Refill:  3   Lab Orders  No laboratory test(s) ordered today    Diagnostics: Spirometry:  Tracings reviewed. Her effort: Good reproducible efforts. FVC: 2.86L FEV1: 2.17L, 76% predicted FEV1/FVC ratio: 76% Interpretation: Spirometry consistent with normal pattern.  Please see scanned spirometry results for details.  Results discussed with patient/family.   Medication List:  Current Outpatient Medications  Medication Sig Dispense Refill   albuterol  (PROVENTIL ) (2.5 MG/3ML) 0.083% nebulizer solution Take 3 mLs (2.5 mg total) by nebulization every 4 (four) hours as needed for wheezing or shortness of breath (coughing fits). 75 mL 1   albuterol  (VENTOLIN  HFA) 108 (90 Base) MCG/ACT inhaler PLEASE SEE ATTACHED FOR DETAILED DIRECTIONS 8.5 each 1   Albuterol -Budesonide  (AIRSUPRA ) 90-80 MCG/ACT AERO Inhale 2 puffs into the lungs every 4 (four) hours as needed (coughing, wheezing, chest tightness). Do not exceed 12 puffs in 24 hours. 10.7 g 2   buPROPion  (WELLBUTRIN  XL) 150 MG 24 hr tablet Take 1 tablet (150 mg total) by mouth every morning. 30 tablet 2   famotidine  (PEPCID )  20 MG tablet TAKE 1 TABLET BY MOUTH TWICE A DAY 180 tablet 1   fluticasone  furoate-vilanterol (BREO ELLIPTA ) 100-25 MCG/ACT AEPB Inhale 1 puff into the lungs daily. Rinse mouth after each use. 60 each 5   guaiFENesin -dextromethorphan (ROBITUSSIN DM) 100-10 MG/5ML syrup Take 5 mLs by mouth every 4 (four) hours as needed for cough. 118 mL 0   montelukast  (SINGULAIR ) 10 MG tablet Take 1 tablet (10 mg total) by mouth at bedtime. 90 tablet 3   Spacer/Aero-Holding Chambers (EQ SPACE CHAMBER ANTI-STATIC) DEVI Inhale into the lungs as directed.     TEZSPIRE  210 MG/1. SOAJ Inject 210 mg into the skin every 28 (twenty-eight)  days. 1.91 mL 11   Current Facility-Administered Medications  Medication Dose Route Frequency Provider Last Rate Last Admin   tezepelumab -ekko (TEZSPIRE ) 210 MG/1. syringe 210 mg  210 mg Subcutaneous Q28 days Luke Orlan HERO, DO   210 mg at 10/12/23 1622   Allergies: Allergies  Allergen Reactions   Amoxicillin Hives   Cefuroxime Axetil Hives   Hydrocodone  Itching   Levofloxacin Other (See Comments)    tendonitis   Morphine Itching   Other Itching   Oxycodone-Acetaminophen  Itching   Penicillins Hives    Has patient had a PCN reaction causing immediate rash, facial/tongue/throat swelling, SOB or lightheadedness with hypotension: Yes Has patient had a PCN reaction causing severe rash involving mucus membranes or skin necrosis: No Has patient had a PCN reaction that required hospitalization: No Has patient had a PCN reaction occurring within the last 10 years: No If all of the above answers are NO, then may proceed with Cephalosporin use.    Propoxyphene Itching   I reviewed her past medical history, social history, family history, and environmental history and no significant changes have been reported from her previous visit.  Review of Systems  Constitutional:  Negative for appetite change, chills, fever and unexpected weight change.  HENT:  Negative for congestion and postnasal drip.   Eyes:  Negative for itching.  Respiratory:  Negative for cough, chest tightness, shortness of breath and wheezing.   Gastrointestinal:  Negative for abdominal pain.  Skin:  Negative for rash.  Allergic/Immunologic: Positive for environmental allergies. Negative for food allergies.  Neurological:  Negative for headaches.    Objective: BP 112/78 (BP Location: Left Arm, Patient Position: Sitting, Cuff Size: Normal)   Pulse 88   Temp 98.8 F (37.1 C) (Temporal)   Resp 16   Ht 5' 4 (1.626 m)   Wt 185 lb 12.8 oz (84.3 kg)   SpO2 96%   BMI 31.89 kg/m  Body mass index is 31.89  kg/m. Physical Exam Vitals and nursing note reviewed.  Constitutional:      Appearance: Normal appearance. She is well-developed.  HENT:     Head: Normocephalic and atraumatic.     Right Ear: Tympanic membrane and external ear normal.     Left Ear: Tympanic membrane and external ear normal.     Nose: Nose normal.     Mouth/Throat:     Mouth: Mucous membranes are moist.     Pharynx: Oropharynx is clear.  Eyes:     Conjunctiva/sclera: Conjunctivae normal.  Cardiovascular:     Rate and Rhythm: Normal rate and regular rhythm.     Heart sounds: Normal heart sounds. No murmur heard. Pulmonary:     Effort: Pulmonary effort is normal.     Breath sounds: Normal breath sounds. No wheezing, rhonchi or rales.  Musculoskeletal:     Cervical  back: Neck supple.  Skin:    General: Skin is warm.     Findings: Rash present.     Comments: Dry skin on the hands b/l.  Neurological:     Mental Status: She is alert and oriented to person, place, and time.  Psychiatric:        Behavior: Behavior normal.    Previous notes and tests were reviewed. The plan was reviewed with the patient/family, and all questions/concerned were addressed.  It was my pleasure to see Erin Collins today and participate in her care. Please feel free to contact me with any questions or concerns.  Sincerely,  Orlan Cramp, DO Allergy  & Immunology  [facility]  [place] office: [phone] [facility] office: [phone]

## 2024-09-12 NOTE — Patient Instructions (Addendum)
 Asthma  Continue Tezspire  injections every 4 weeks. Daily controller medication(s):   Use Breo 100mcg 1 puff once a day and rinse mouth after each use. May need to use this daily in the fall and April/May months. Continue Singulair  10mg  daily. May use Airsupra  rescue inhaler 2 puffs every 4 to 6 hours as needed for shortness of breath, chest tightness, coughing, and wheezing. Do not use more than 12 puffs in 24 hours. May use Airsupra  rescue inhaler 2 puffs 5 to 15 minutes prior to strenuous physical activities. Rinse mouth after each use.  Make sure they run the coupon code.  Monitor frequency of use - if you need to use it more than twice per week on a consistent basis let us  know.  Asthma control goals:  Full participation in all desired activities (may need albuterol  before activity) Albuterol  use two times or less a week on average (not counting use with activity) Cough interfering with sleep two times or less a month Oral steroids no more than once a year No hospitalizations    Seasonal and perennial allergic rhinoconjunctivitis 2022 bloodwork positive to dust mites, cat, dog, grass pollen. Borderline to cockroach, mouse and maple/box elder tree pollen.  Continue environmental control measures - especially regarding the cats. Continue Singulair  (montelukast ) 10mg  daily.  Use over the counter antihistamines such as Zyrtec (cetirizine), Claritin (loratadine), Allegra (fexofenadine), or Xyzal (levocetirizine) daily as needed. May take twice a day during allergy  flares. May switch antihistamines every few months. Use Flonase (fluticasone ) nasal spray 1-2 sprays per nostril once a day as needed for nasal congestion.  Nasal saline spray (i.e., Simply Saline) or nasal saline lavage (i.e., NeilMed) is recommended as needed and prior to medicated nasal sprays. May use refresh eye drops.  Consider allergy  injections for long term control if above medications do not help the symptoms.    Heartburn Continue lifestyle and dietary modifications. Continue omeprazole and Pepcid  as before.   Other atopic dermatitis Continue proper skin care measures. Use hydrocortisone 1% cream twice a day as needed for mild rash flares - okay to use on the face, neck, groin area. Do not use more than 1 week at a time.   Multiple drug allergies Continue to avoid medications on your allergy  list.  Recommend penicillin allergy  testing/drug challenge.  Penicillin allergy : Consider penicillin allergy  skin testing and in office drug challenge in the future.  Over 90% of people with history of penicillin allergy  which occurred over 10 years ago are found to be non-allergic.  You must be off antihistamines for 3-5 days before. Must be in good health and not ill. No vaccines/injections/antibiotics within the past 7 days. Plan on being in the office for 2-3 hours and must bring in the drug you want to do the oral challenge for - will send in prescription to pick up a few days before. You must call to schedule an appointment and specify it's for a drug challenge.    Follow up in 6 months or sooner if needed.

## 2024-09-14 NOTE — Progress Notes (Signed)
 Internal Medicine Clinic Attending  I was physically present during the key portions of the resident provided service and participated in the medical decision making of patient's management care. I reviewed pertinent patient test results.  The assessment, diagnosis, and plan were formulated together and I agree with the documentation in the resident's note. There is discrepancy in timing of bupropion  in the note - it should be taken every morning rather than evening.   Trudy Mliss Dragon, MD

## 2024-09-19 ENCOUNTER — Other Ambulatory Visit: Payer: Self-pay

## 2024-09-19 DIAGNOSIS — F320 Major depressive disorder, single episode, mild: Secondary | ICD-10-CM

## 2024-09-19 DIAGNOSIS — N951 Menopausal and female climacteric states: Secondary | ICD-10-CM

## 2024-09-25 ENCOUNTER — Ambulatory Visit

## 2024-09-25 ENCOUNTER — Ambulatory Visit: Admitting: Student

## 2024-09-25 ENCOUNTER — Ambulatory Visit: Admitting: Podiatry

## 2024-09-25 ENCOUNTER — Encounter: Payer: Self-pay | Admitting: Student

## 2024-09-25 VITALS — Ht 64.0 in | Wt 185.8 lb

## 2024-09-25 VITALS — BP 123/83 | HR 86 | Temp 98.4°F | Ht 64.0 in | Wt 189.2 lb

## 2024-09-25 DIAGNOSIS — Z79899 Other long term (current) drug therapy: Secondary | ICD-10-CM | POA: Diagnosis not present

## 2024-09-25 DIAGNOSIS — F3289 Other specified depressive episodes: Secondary | ICD-10-CM

## 2024-09-25 DIAGNOSIS — Z1211 Encounter for screening for malignant neoplasm of colon: Secondary | ICD-10-CM

## 2024-09-25 DIAGNOSIS — F320 Major depressive disorder, single episode, mild: Secondary | ICD-10-CM

## 2024-09-25 DIAGNOSIS — M7662 Achilles tendinitis, left leg: Secondary | ICD-10-CM

## 2024-09-25 DIAGNOSIS — N951 Menopausal and female climacteric states: Secondary | ICD-10-CM

## 2024-09-25 DIAGNOSIS — Z1231 Encounter for screening mammogram for malignant neoplasm of breast: Secondary | ICD-10-CM

## 2024-09-25 MED ORDER — BUPROPION HCL ER (XL) 300 MG PO TB24: 300.0000 mg | ORAL_TABLET | Freq: Every day | ORAL | 2 refills | Status: AC

## 2024-09-25 MED ORDER — PREDNISONE 10 MG PO TABS
ORAL_TABLET | ORAL | 0 refills | Status: AC
Start: 2024-09-25 — End: 2024-10-02

## 2024-09-25 NOTE — Assessment & Plan Note (Deleted)
 Patient presents today for follow up regarding depression. She states that she did not notice much of a difference with the Wellbutrin .

## 2024-09-25 NOTE — Progress Notes (Signed)
  Subjective:  Patient ID: Erin Collins, female    DOB: 1976/12/18,  MRN: [id number]  Chief Complaint  Patient presents with   Foot Pain    RM 1 Patient is here for achilles tendonitis of the left foot. Pt states when barefoot left heels feels like she is stepping on an object, cramps in calf/toes and numbing and swelling in left foot post surgery.    48 y.o. female presents with the above complaint. History confirmed with patient.  She returns for follow-up after previous left Achilles secondary repair and resection and FHL transfer and gastroc recession in 2023.  Still having some tenderness in the back of the heel but more so now on the bottom of the walking on a rock or pebble.  Also dealing with quite a bit of cramping  Objective:  Physical Exam: warm, good capillary refill, no trophic changes or ulcerative lesions, normal DP and PT pulses, normal sensory exam, and well-healed posterior incision, slight tenderness to palpation, swelling around the Achilles and plantar heel tenderness.   Radiographs: Multiple views x-ray of the left foot: Radiographs taken today shows no fracture or stress fracture there is some new calcification around the FHL transfer site Assessment:   1. Tendonitis, Achilles, left      Plan:  Patient was evaluated and treated and all questions answered.  She is having some persistent Achilles tendinitis.  Recommended restarting physical therapy exercises these were given to her.  Also recommended a Tuli's heel cup to elevate the heel and cushion it, prednisone  taper sent to pharmacy to use as anti-inflammatory.  Avoid going barefoot.  Follow-up with me in 8 weeks to reevaluate  Return in about 8 weeks (around 11/20/2024) for re-check Achilles tendon.

## 2024-09-25 NOTE — Patient Instructions (Signed)
 Look for a Tuli's heel cup on [employer] to use (heavy duty ones)   Achilles Tendinitis  with Rehab Achilles tendinitis is a disorder of the Achilles tendon. The Achilles tendon connects the large calf muscles (Gastrocnemius and Soleus) to the heel bone (calcaneus). This tendon is sometimes called the heel cord. It is important for pushing-off and standing on your toes and is important for walking, running, or jumping. Tendinitis is often caused by overuse and repetitive microtrauma. SYMPTOMS Pain, tenderness, swelling, warmth, and redness may occur over the Achilles tendon even at rest. Pain with pushing off, or flexing or extending the ankle. Pain that is worsened after or during activity. CAUSES  Overuse sometimes seen with rapid increase in exercise programs or in sports requiring running and jumping. Poor physical conditioning (strength and flexibility or endurance). Running sports, especially training running down hills. Inadequate warm-up before practice or play or failure to stretch before participation. Injury to the tendon. PREVENTION  Warm up and stretch before practice or competition. Allow time for adequate rest and recovery between practices and competition. Keep up conditioning. Keep up ankle and leg flexibility. Improve or keep muscle strength and endurance. Improve cardiovascular fitness. Use proper technique. Use proper equipment (shoes, skates). To help prevent recurrence, taping, protective strapping, or an adhesive bandage may be recommended for several weeks after healing is complete. PROGNOSIS  Recovery may take weeks to several months to heal. Longer recovery is expected if symptoms have been prolonged. Recovery is usually quicker if the inflammation is due to a direct blow as compared with overuse or sudden strain. RELATED COMPLICATIONS  Healing time will be prolonged if the condition is not correctly treated. The injury must be given plenty of time to  heal. Symptoms can reoccur if activity is resumed too soon. Untreated, tendinitis may increase the risk of tendon rupture requiring additional time for recovery and possibly surgery. TREATMENT  The first treatment consists of rest anti-inflammatory medication, and ice to relieve the pain. Stretching and strengthening exercises after resolution of pain will likely help reduce the risk of recurrence. Referral to a physical therapist or athletic trainer for further evaluation and treatment may be helpful. A walking boot or cast may be recommended to rest the Achilles tendon. This can help break the cycle of inflammation and microtrauma. Arch supports (orthotics) may be prescribed or recommended by your caregiver as an adjunct to therapy and rest. Surgery to remove the inflamed tendon lining or degenerated tendon tissue is rarely necessary and has shown less than predictable results. MEDICATION  Nonsteroidal anti-inflammatory medications, such as aspirin and ibuprofen , may be used for pain and inflammation relief. Do not take within 7 days before surgery. Take these as directed by your caregiver. Contact your caregiver immediately if any bleeding, stomach upset, or signs of allergic reaction occur. Other minor pain relievers, such as acetaminophen , may also be used. Pain relievers may be prescribed as necessary by your caregiver. Do not take prescription pain medication for longer than 4 to 7 days. Use only as directed and only as much as you need. Cortisone injections are rarely indicated. Cortisone injections may weaken tendons and predispose to rupture. It is better to give the condition more time to heal than to use them. HEAT AND COLD Cold is used to relieve pain and reduce inflammation for acute and chronic Achilles tendinitis. Cold should be applied for 10 to 15 minutes every 2 to 3 hours for inflammation and pain and immediately after any activity that aggravates your  symptoms. Use ice packs or an  ice massage. Heat may be used before performing stretching and strengthening activities prescribed by your caregiver. Use a heat pack or a warm soak. SEEK MEDICAL CARE IF: Symptoms get worse or do not improve in 2 weeks despite treatment. New, unexplained symptoms develop. Drugs used in treatment may produce side effects.  EXERCISES:  RANGE OF MOTION (ROM) AND STRETCHING EXERCISES - Achilles Tendinitis  These exercises may help you when beginning to rehabilitate your injury. Your symptoms may resolve with or without further involvement from your physician, physical therapist or athletic trainer. While completing these exercises, remember:  Restoring tissue flexibility helps normal motion to return to the joints. This allows healthier, less painful movement and activity. An effective stretch should be held for at least 30 seconds. A stretch should never be painful. You should only feel a gentle lengthening or release in the stretched tissue.  STRETCH  Gastroc, Standing  Place hands on wall. Extend right / left leg, keeping the front knee somewhat bent. Slightly point your toes inward on your back foot. Keeping your right / left heel on the floor and your knee straight, shift your weight toward the wall, not allowing your back to arch. You should feel a gentle stretch in the right / left calf. Hold this position for 10 seconds. Repeat 3 times. Complete this stretch 2 times per day.  STRETCH  Soleus, Standing  Place hands on wall. Extend right / left leg, keeping the other knee somewhat bent. Slightly point your toes inward on your back foot. Keep your right / left heel on the floor, bend your back knee, and slightly shift your weight over the back leg so that you feel a gentle stretch deep in your back calf. Hold this position for 10 seconds. Repeat 3 times. Complete this stretch 2 times per day.  STRETCH  Gastrocsoleus, Standing  Note: This exercise can place a lot of stress on your  foot and ankle. Please complete this exercise only if specifically instructed by your caregiver.  Place the ball of your right / left foot on a step, keeping your other foot firmly on the same step. Hold on to the wall or a rail for balance. Slowly lift your other foot, allowing your body weight to press your heel down over the edge of the step. You should feel a stretch in your right / left calf. Hold this position for 10 seconds. Repeat this exercise with a slight bend in your knee. Repeat 3 times. Complete this stretch 2 times per day.   STRENGTHENING EXERCISES - Achilles Tendinitis These exercises may help you when beginning to rehabilitate your injury. They may resolve your symptoms with or without further involvement from your physician, physical therapist or athletic trainer. While completing these exercises, remember:  Muscles can gain both the endurance and the strength needed for everyday activities through controlled exercises. Complete these exercises as instructed by your physician, physical therapist or athletic trainer. Progress the resistance and repetitions only as guided. You may experience muscle soreness or fatigue, but the pain or discomfort you are trying to eliminate should never worsen during these exercises. If this pain does worsen, stop and make certain you are following the directions exactly. If the pain is still present after adjustments, discontinue the exercise until you can discuss the trouble with your clinician.  STRENGTH - Plantar-flexors  Sit with your right / left leg extended. Holding onto both ends of a rubber exercise band/tubing, loop  it around the ball of your foot. Keep a slight tension in the band. Slowly push your toes away from you, pointing them downward. Hold this position for 10 seconds. Return slowly, controlling the tension in the band/tubing. Repeat 3 times. Complete this exercise 2 times per day.   STRENGTH - Plantar-flexors  Stand with your  feet shoulder width apart. Steady yourself with a wall or table using as little support as needed. Keeping your weight evenly spread over the width of your feet, rise up on your toes.* Hold this position for 10 seconds. Repeat 3 times. Complete this exercise 2 times per day.  *If this is too easy, shift your weight toward your right / left leg until you feel challenged. Ultimately, you may be asked to do this exercise with your right / left foot only.  STRENGTH  Plantar-flexors, Eccentric  Note: This exercise can place a lot of stress on your foot and ankle. Please complete this exercise only if specifically instructed by your caregiver.  Place the balls of your feet on a step. With your hands, use only enough support from a wall or rail to keep your balance. Keep your knees straight and rise up on your toes. Slowly shift your weight entirely to your right / left toes and pick up your opposite foot. Gently and with controlled movement, lower your weight through your right / left foot so that your heel drops below the level of the step. You will feel a slight stretch in the back of your calf at the end position. Use the healthy leg to help rise up onto the balls of both feet, then lower weight only on the right / left leg again. Build up to 15 repetitions. Then progress to 3 consecutive sets of 15 repetitions.* After completing the above exercise, complete the same exercise with a slight knee bend (about 30 degrees). Again, build up to 15 repetitions. Then progress to 3 consecutive sets of 15 repetitions.* Perform this exercise 2 times per day.  *When you easily complete 3 sets of 15, your physician, physical therapist or athletic trainer may advise you to add resistance by wearing a backpack filled with additional weight.  STRENGTH - Plantar Flexors, Seated  Sit on a chair that allows your feet to rest flat on the ground. If necessary, sit at the edge of the chair. Keeping your toes firmly on the  ground, lift your right / left heel as far as you can without increasing any discomfort in your ankle. Repeat 3 times. Complete this exercise 2 times a day.

## 2024-09-25 NOTE — Assessment & Plan Note (Addendum)
 Patient presents today for follow up regarding depression. She states that she did not notice much of a difference with the Wellbutrin . She states that she has a lot of trouble with outside stressors with her family. She States that she did not feel a difference with her Wellbutrin . PHQ 9 has improved from last visit to 9. She did report that her husband did state that she was better. She has tried many SSRIs and SNRIs in the past and none of these have helped. She has followed with psychiatry in the past and has been lost to follow up. Discussed to return back to psychiatry as there is not much from an internal medicine standpoint we can offer as he have tried mayn medications that did not work. Did show her a great tool called psychology today which she was excited to hear about.   Plan: - Refer her back to psychiatry  - Increase Wellbutrin  up to 300 mg daily as PHQ-9 score did improve - Encourage patient to use psychology today.

## 2024-09-25 NOTE — Progress Notes (Signed)
 CC: Depression follow up   HPI:  [name] is a 48 y.o. female with a past medical history of asthma, GERD, osteoporosis, depression who presents for follow-up appointment.  Please see assessment and plan for full HPI.  Medications: Depression: Wellbutrin  300 mg daily Asthma: Albuterol  2.5 mg nebulizer every 4 hours as needed, albuterol  inhaler, Airsupra  2 puffs every 4 hours as needed, Breo 1 puff daily, Robitussin, singular 10 mg daily, Tezpire 210 mg every month GERD: Famotidine  20 mg twice daily  Past Medical History:  Diagnosis Date   Anemia    Angio-edema    Asthma    Basal cell carcinoma ~ 1999   collarbone   Blood clot in vein ~ 2008   left breast    Complication of anesthesia    I have trouble waking up afterwards (12/18/2017)   Diarrhea 04/01/2019   Eczema    Family history of adverse reaction to anesthesia    mom has trouble waking up afterwards (12/18/2017)   Fluttering heart    comes and goes (12/18/2017)   GERD (gastroesophageal reflux disease)    Headache    weekly (12/18/2017)   History of kidney stones    Recurrent upper respiratory infection (URI)    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 Do not exceed 12 puffs in 24 hours., Disp: 10.7 g, Rfl: 2   buPROPion  (WELLBUTRIN  XL) 300 MG 24 hr tablet, Take 1 tablet (300 mg total) by mouth daily., Disp: 90 tablet, Rfl: 2   famotidine  (PEPCID ) 20  MG tablet, TAKE 1 TABLET BY MOUTH TWICE A DAY, Disp: 180 tablet, Rfl: 1   fluticasone  furoate-vilanterol (BREO ELLIPTA ) 100-25 MCG/ACT AEPB, Inhale 1 puff into the lungs daily. Rinse mouth after each use., Disp: 60 each, Rfl: 5   guaiFENesin -dextromethorphan (ROBITUSSIN DM) 100-10 MG/5ML syrup, Take 5 mLs by mouth every 4 (four) hours as needed for cough., Disp: 118 mL, Rfl: 0   montelukast  (SINGULAIR ) 10 MG tablet, Take 1 tablet (10 mg total) by mouth at bedtime., Disp: 90 tablet, Rfl: 3   predniSONE  (DELTASONE ) 10 MG tablet, Take 6 tablets (60 mg total) by mouth daily with breakfast for 1 day, THEN 5 tablets (50 mg total) daily with breakfast for 1 day, THEN 4 tablets (40 mg total) daily with breakfast for 1 day, THEN 3 tablets (30 mg total) daily with breakfast for 1 day, THEN 2 tablets (20 mg total) daily with breakfast for 1 day, THEN 1 tablet (10 mg total) daily with breakfast for 1 day, THEN 1 tablet (10 mg total) daily with breakfast for 1 day., Disp: 22 tablet, Rfl: 0   Spacer/Aero-Holding Chambers (EQ SPACE CHAMBER ANTI-STATIC) DEVI, Inhale into the lungs as directed., Disp: , Rfl:    TEZSPIRE  210 MG/1. SOAJ, Inject 210 mg into the skin every 28 (twenty-eight) days., Disp: 1.91 mL, Rfl: 11  Current Facility-Administered Medications:    tezepelumab -ekko (TEZSPIRE ) 210  MG/1.91ML syringe 210 mg, 210 mg, Subcutaneous, Q28 days, Luke Orlan HERO, DO, 210 mg at 10/12/23 1622  Review of Systems:    Negative except for what is stated in the HPI   Physical Exam:  Vitals:   09/25/24 1510  BP: 123/83  Pulse: 86  Temp: 98.4 F (36.9 C)  TempSrc: Oral  SpO2: 97%  Weight: 189 lb 3.2 oz (85.8 kg)  Height: 5' 4 (1.626 m)   General: Patient is sitting comfortably in the room  Head: Normocephalic, atraumatic  Cardio: Regular rate and rhythm, no murmurs, rubs or gallop Pulmonary: Clear to ausculation bilaterally with no rales, rhonchi, and crackles    Assessment & Plan:   Assessment &  Plan Colon cancer screening Patient referred for colon cancer screening with Colonoscopy.  Encounter for screening mammogram for malignant neoplasm of breast Patient referred for mammogram for breast cancer screening.  Other depression Patient presents today for follow up regarding depression. She states that she did not notice much of a difference with the Wellbutrin . She states that she has a lot of trouble with outside stressors with her family. She States that she did not feel a difference with her Wellbutrin . PHQ 9 has improved from last visit to 9. She did report that her husband did state that she was better. She has tried many SSRIs and SNRIs in the past and none of these have helped. She has followed with psychiatry in the past and has been lost to follow up. Discussed to return back to psychiatry as there is not much from an internal medicine standpoint we can offer as he have tried mayn medications that did not work. Did show her a great tool called psychology today which she was excited to hear about.   Plan: - Refer her back to psychiatry  - Increase Wellbutrin  up to 300 mg daily as PHQ-9 score did improve - Encourage patient to use psychology today.   Patient discussed with Dr. Ronnald Sergeant  Libby Blanch, DO Internal Medicine Resident PGY-3

## 2024-09-25 NOTE — Patient Instructions (Addendum)
 Erin Collins,Thank you for allowing me to take part in your care today.  Here are your instructions.  1.  Please go to your psychiatrist.  You should not need a referral.  2.  I will refill your Wellbutrin . Increase it to 300 mg daily.   3.  Please go to psychology today website.  You can get a therapist there.  4.  I have referred you for a colonoscopy and a mammogram  PLEASE BRING YOUR MEDICATIONS TO EVERY APPOINTMENT  Thank you, Dr. Tobie  If you have any other questions please contact the internal medicine clinic at [phone] If it is after hours, please call the [facility] at [phone] and then ask the person who picks up for the resident on call.

## 2024-09-26 NOTE — Progress Notes (Signed)
 Internal Medicine Clinic Attending  Case discussed with the resident at the time of the visit.  We reviewed the resident's history and exam and pertinent patient test results.  I agree with the assessment, diagnosis, and plan of care documented in the resident's note.

## 2024-10-04 ENCOUNTER — Ambulatory Visit

## 2024-10-25 ENCOUNTER — Ambulatory Visit: Payer: Self-pay

## 2024-10-25 NOTE — Telephone Encounter (Signed)
 FYI Only or Action Required?: FYI only for provider: UC Advised.  Patient was last seen in primary care on 09/25/2024 by Tobie Gaines, DO.  Called Nurse Triage reporting Chest Pain.  Symptoms began a week ago.  Interventions attempted: Rest, hydration, or home remedies.  Symptoms are: gradually worsening.  Triage Disposition: See Physician Within 24 Hours  Patient/caregiver understands and will follow disposition?:   Copied from CRM #[id number]. Topic: Clinical - Red Word Triage >> Oct 25, 2024  3:16 PM Alfonso ORN wrote: Red Word that prompted transfer to Nurse Triage:  patient has sharp pain in left side rate pain 8 for about a week in half , notice it when drink a soda  nausea off and on and no appetite Reason for Disposition  [1] Chest pain lasts < 5 minutes AND [2] NO chest pain or cardiac symptoms (e.g., breathing difficulty, sweating) now  (Exception: Chest pains that last only a few seconds.)  Answer Assessment - Initial Assessment Questions Pt reports onset of breif intermittent 8/10 sharp pain in lower left ribcage area on backside and very mild nausea without emesis 1.5 weeks ago. No symptoms now. Denies left sided CP, SOB, heartburn or dizziness. Episodes last 15-20 seconds and are worse when bending down. Reports some constipation. Pt thinks it may be d/t the fact that she has been eating a lot of soda, cheese and milk the past 1.5 weeks. Also reports onset of jaw tightness 1 month ago, thinks it may be r/t having her wellbutrin  dose increased at that time. No OV available within pt region today, advised UC. Pt agreeable to go. Advised ED for worsening symptoms.  1. LOCATION: Where does it hurt?       Lower left back ribcage area  2. RADIATION: Does the pain go anywhere else? (e.g., into neck, jaw, arms, back)     Denies  3. ONSET: When did the chest pain begin? (Minutes, hours or days)      1.5 weeks ago  4. PATTERN: Does the pain come and go, or has it been constant  since it started?  Does it get worse with exertion?      Intermittent, can happen up to 10x/day  5. DURATION: How long does it last (e.g., seconds, minutes, hours)     15-20 seconds  6. SEVERITY: How bad is the pain?  (e.g., Scale 1-10; mild, moderate, or severe)     8/10 sharp aching pain, worse when bending down  7. CARDIAC RISK FACTORS: Do you have any history of heart problems or risk factors for heart disease? (e.g., angina, prior heart attack; diabetes, high blood pressure, high cholesterol, smoker, or strong family history of heart disease)     Denies  8. PULMONARY RISK FACTORS: Do you have any history of lung disease?  (e.g., blood clots in lung, asthma, emphysema, birth control pills)     Severe asthma, no sob or chest tightness. Hx of blood clot in left breast  9. CAUSE: What do you think is causing the chest pain?     Has been drinking a lot of soda, cheese and milk within past 1.5 weeks. Hasn't done any heavy lifting or had any injuries recently.  10. OTHER SYMPTOMS: Do you have any other symptoms? (e.g., dizziness, nausea, vomiting, sweating, fever, difficulty breathing, cough)       Denies SOB. Reports nausea off and on for past 1.5 weeks without emesis, worse when smelling food. Jaw tightness 1 month ago, pt thinks it may be  r/t having her Wellbutrin  dose increased to 300 mg at that time.   11. PREGNANCY: Is there any chance you are pregnant? When was your last menstrual period?       Denies, LMP last month. Going through perimenopause.  Protocols used: Chest Pain-A-AH

## 2024-10-28 NOTE — Telephone Encounter (Signed)
 RTC to patient.  Call to check on her symptoms .  Message was left to call the Clinics.

## 2024-11-11 ENCOUNTER — Ambulatory Visit: Payer: Self-pay | Admitting: *Deleted

## 2024-11-11 ENCOUNTER — Ambulatory Visit: Admitting: Student

## 2024-11-11 VITALS — BP 129/76 | HR 82 | Temp 97.9°F | Ht 64.0 in | Wt 193.4 lb

## 2024-11-11 DIAGNOSIS — R531 Weakness: Secondary | ICD-10-CM | POA: Diagnosis not present

## 2024-11-11 MED ORDER — AIRSUPRA 90-80 MCG/ACT IN AERO: 2.0000 | INHALATION_SPRAY | RESPIRATORY_TRACT | 2 refills | Status: AC | PRN

## 2024-11-11 MED ORDER — ALBUTEROL SULFATE HFA 108 (90 BASE) MCG/ACT IN AERS: 2.0000 | INHALATION_SPRAY | Freq: Four times a day (QID) | RESPIRATORY_TRACT | 1 refills | Status: DC | PRN

## 2024-11-11 NOTE — Telephone Encounter (Signed)
 Pt has an appt today with Dr Amoako,.

## 2024-11-11 NOTE — Assessment & Plan Note (Addendum)
 Erin Collins presents with generalized weakness and body aches, which may have multiple contributing factors. My differential diagnosis includes anemia (potentially due to blood loss from her heavy periods), metabolic issues like hypoglycemia or electrolyte disturbances, endocrine disorders (particularly hypothyroidism), infections, arrhythmias, depression, and rheumatologic conditions. She is afebrile and denies cough. However, she has been using her rescue inhaler more frequently this week, mainly for chest tightness and a sensation of heaviness, rather than classic shortness of breath. She reports chronic hand pain, though denies chest palpitations. She also describes symptoms suggestive of menopause, including irregular periods with heavy bleeding and clotting. On exam, her chest is clear, heart sounds are normal, and there's no edema in her lower extremities. I don't suspect a cardiac cause for her symptoms. I believe her generalized weakness is likely multifactorial, possibly due to blood loss from her heavy periods, as well as her recent depression, for which she was started on Wellbutrin  a few weeks ago. There's also the possibility of worsening asthma, though she denies any cough. Notably, she hasn't refilled her maintenance inhalers in several months, which raises concerns that her asthma may be poorly controlled.She has no URI symptoms and her appetite has not changed.    - I will order a CBC, CMP, iron studies, and TSH to assess for anemia, metabolic causes, and thyroid dysfunction.  -  Given her increased use of albuterol , I strongly recommended a follow-up with her pulmonologist to evaluate her asthma management and ensure her symptoms are being adequately addressed.  - If the lab results are normal, I will consider initiating a rheumatologic workup to rule out autoimmune or inflammatory conditions, particularly in light of her chronic hand pain.

## 2024-11-11 NOTE — Progress Notes (Signed)
 CC: Generalized weakness and body ache  HPI:  [name] is a 48 y.o. female living with a history stated below and presents today for generalized weakness and body aches . Please see problem based assessment and plan for additional details.  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 75 mL 1   buPROPion  (WELLBUTRIN  XL) 300 MG 24 hr tablet Take 1 tablet (300 mg total) by mouth daily. 90 tablet 2   famotidine  (PEPCID ) 20 MG tablet TAKE 1 TABLET BY MOUTH TWICE A DAY 180 tablet 1   fluticasone  furoate-vilanterol (BREO ELLIPTA ) 100-25 MCG/ACT AEPB Inhale 1 puff into the lungs daily. Rinse mouth after each use. 60 each 5   guaiFENesin -dextromethorphan (ROBITUSSIN DM) 100-10 MG/5ML syrup Take 5 mLs by mouth every 4 (four) hours as needed for cough. 118 mL 0   montelukast  (SINGULAIR ) 10 MG tablet Take 1 tablet (10 mg total) by mouth at bedtime. 90 tablet 3   Spacer/Aero-Holding Chambers (EQ SPACE CHAMBER ANTI-STATIC) DEVI Inhale into the lungs as directed.     TEZSPIRE  210 MG/1. SOAJ Inject  210 mg into the skin every 28 (twenty-eight) days. 1.91 mL 11   Current Facility-Administered Medications on File Prior to Visit  Medication Dose Route Frequency Provider Last Rate Last Admin   tezepelumab -ekko (TEZSPIRE ) 210 MG/1. syringe 210 mg  210 mg Subcutaneous Q28 days Erin Orlan HERO, DO   210 mg at 10/12/23 1622    Family History  Problem Relation Age of Onset   Allergic rhinitis Mother    Asthma Mother     Social History   Socioeconomic History   Marital status: Married    Spouse name: Not on file   Number of children: Not on file   Years of education: Not on file   Highest education level: Some college, no degree  Occupational History   Not on file  Tobacco Use   Smoking status: Never    Passive exposure: Never   Smokeless tobacco: Never  Vaping Use   Vaping status: Never Used  Substance and Sexual Activity   Alcohol use: Not Currently    Comment: 12/18/2017 maybe 1/year on my birthday  Rarely.   Drug use: No   Sexual activity: Yes    Partners: Male    Birth control/protection: None, OCP  Other Topics Concern   Not on file  Social History Narrative   Caffeine; large soda daily (20oz).  Education : HS some college.   Working:  Editor, Commissioning.    Social Drivers of Corporate Investment Banker Strain: Low Risk  (04/19/2024)   Overall Financial Resource Strain (CARDIA)    Difficulty of Paying Living Expenses: Not hard at all  Food Insecurity: No Food Insecurity (04/19/2024)   Hunger Vital Sign    Worried About Running Out of Food in the Last Year: Never true    Ran Out of Food in the Last Year: Never true  Transportation Needs: No Transportation Needs (04/19/2024)   PRAPARE - Administrator, Civil Service (Medical): No    Lack of Transportation (Non-Medical): No  Physical Activity: Unknown (04/19/2024)   Exercise Vital Sign    Days of Exercise per Week: 0 days    Minutes of Exercise per Session: Not on file  Stress: Stress Concern Present  (04/19/2024)   [employer] of Occupational Health - Occupational Stress Questionnaire    Feeling of Stress : Rather much  Social Connections: Moderately Isolated (04/19/2024)   Social Connection and Isolation Panel    Frequency of Communication with Friends and Family: Once a week    Frequency of Social Gatherings with Friends and Family: Once a week    Attends Religious Services: 1 to 4 times per year    Active Member of [employer] or Organizations: No    Attends Engineer, Structural: Not on file    Marital Status: Married  Catering Manager Violence: Not on file    Review of Systems: ROS negative except for what is noted on the assessment and plan.  Vitals:   11/11/24 1422  BP: 129/76  Pulse: 82  Temp: 97.9 F (36.6 C)  TempSrc: Oral  SpO2: 97%  Weight: 193 lb 6.4 oz (87.7 kg)  Height: 5' 4 (1.626 m)    Physical Exam: Constitutional: well-appearing woman, sitting in chair , in no acute distress HENT: normocephalic atraumatic, mucous membranes moist Eyes: conjunctiva non-erythematous,no pallor  Cardiovascular: regular rate and rhythm, no m/r/g Pulmonary/Chest: normal work of breathing on room air, lungs clear to auscultation bilaterally Neurological: alert & oriented x 3, no focal deficit Skin: warm and dry Psych: normal mood and behavior  Assessment & Plan:   Generalized weakness [name] presents with generalized weakness and body aches, which may have multiple contributing factors. My differential diagnosis includes anemia (potentially due to blood loss from her heavy periods), metabolic issues like hypoglycemia or electrolyte disturbances, endocrine disorders (particularly hypothyroidism), infections, arrhythmias, depression, and rheumatologic conditions. She is afebrile and denies cough. However, she has been using her rescue inhaler more frequently this week, mainly for chest tightness and a sensation of heaviness, rather than classic shortness of breath. She  reports chronic hand pain, though denies chest palpitations. She also describes symptoms suggestive of menopause, including irregular periods with heavy bleeding and clotting. On exam, her chest is clear, heart sounds are normal, and there's no edema in her lower extremities. I don't suspect a cardiac cause for her symptoms. I believe her generalized weakness is likely multifactorial, possibly due to blood loss from her heavy periods, as well as her recent depression, for which she was started on Wellbutrin  a few weeks ago. There's also the possibility of worsening asthma, though she denies any cough. Notably, she hasn't refilled her maintenance inhalers in several months, which raises concerns that her asthma may be poorly controlled.She has no URI symptoms and her appetite has not changed.    - I will  order a CBC, CMP, iron studies, and TSH to assess for anemia, metabolic causes, and thyroid dysfunction.  -  Given her increased use of albuterol , I strongly recommended a follow-up with her pulmonologist to evaluate her asthma management and ensure her symptoms are being adequately addressed.  - If the lab results are normal, I will consider initiating a rheumatologic workup to rule out autoimmune or inflammatory conditions, particularly in light of her chronic hand pain.    Patient seen with Dr. Karna Drue Grow, M.D [facility] Phone: [phone] Date 11/11/2024 Time 9:26 PM

## 2024-11-11 NOTE — Patient Instructions (Signed)
 Thank you, [name] for allowing us  to provide your care today. Today we discussed your generalized weakness .    I am getting some blood work and call you when they return. I refilled your inhalers as well   I have ordered the following labs for you:  Lab Orders         CBC with Diff         Comprehensive metabolic panel with GFR         TSH         Iron, TIBC and Ferritin Panel      Tests ordered today:    Referrals ordered today:   Referral Orders  No referral(s) requested today     I have ordered the following medication/changed the following medications:   Stop the following medications: Medications Discontinued During This Encounter  Medication Reason   albuterol  (VENTOLIN  HFA) 108 (90 Base) MCG/ACT inhaler Reorder   Albuterol -Budesonide  (AIRSUPRA ) 90-80 MCG/ACT AERO Reorder     Start the following medications: Meds ordered this encounter  Medications   Albuterol -Budesonide  (AIRSUPRA ) 90-80 MCG/ACT AERO    Sig: Inhale 2 puffs into the lungs every 4 (four) hours as needed (coughing, wheezing, chest tightness). Do not exceed 12 puffs in 24 hours.    Dispense:  10.7 g    Refill:  2    BIN: [id number], PCN: [id number], GRP: [id number], ID [id number]   albuterol  (VENTOLIN  HFA) 108 (90 Base) MCG/ACT inhaler    Sig: Inhale 2 puffs into the lungs every 6 (six) hours as needed for wheezing or shortness of breath.    Dispense:  8.5 each    Refill:  1     Follow up: 2 months    Should you have any questions or concerns please call the internal medicine clinic at [phone].   Drue Lisa Grow MD 11/11/2024, 3:32 PM   [facility]

## 2024-11-11 NOTE — Telephone Encounter (Signed)
 FYI Only or Action Required?: FYI only for provider: appointment scheduled on 11/24.  Patient was last seen in primary care on 09/25/2024 by Tobie Gaines, DO.  Called Nurse Triage reporting Fatigue (Headache, fatigue, body aches).  Symptoms began several days ago.  Interventions attempted: Prescription medications: Albuterol .  Symptoms are: gradually worsening.  Triage Disposition: See HCP Within 4 Hours (Or PCP Triage)  Patient/caregiver understands and will follow disposition?: Yes  Copied from CRM #[id number]. Topic: Clinical - Red Word Triage >> Nov 11, 2024  8:22 AM Miquel SAILOR wrote: Red Word that prompted transfer to Nurse Triage: fatigue/ Tired /Headaches/bodyaches since since 11/22 but getting worse/Can not sleep Reason for Disposition  [1] MODERATE weakness (e.g., interferes with work, school, normal activities) AND [2] cause unknown  (Exceptions: Weakness from acute minor illness or poor fluid intake; weakness is chronic and not worse.)  Answer Assessment - Initial Assessment Questions 1. DESCRIPTION: Describe how you are feeling.     Feels like she has been run over by truck, fatigue, body aches, headache 2. SEVERITY: How bad is it?  Can you stand and walk?     In am is worse- feels like she is weighted down 3. ONSET: When did these symptoms begin? (e.g., hours, days, weeks, months)     Since Saturday 4. CAUSE: What do you think is causing the weakness or fatigue? (e.g., not drinking enough fluids, medical problem, trouble sleeping)     Trouble sleeping- wakes at night 5. NEW MEDICINES:  Have you started on any new medicines recently? (e.g., opioid pain medicines, benzodiazepines, muscle relaxants, antidepressants, antihistamines, neuroleptics, beta blockers)     no 6. OTHER SYMPTOMS: Do you have any other symptoms? (e.g., chest pain, fever, cough, SOB, vomiting, diarrhea, bleeding, other areas of pain)     SOB- patient has asthma- using rescue inhaler throughout  the day  Protocols used: Weakness (Generalized) and Fatigue-A-AH

## 2024-11-12 ENCOUNTER — Ambulatory Visit: Payer: Self-pay | Admitting: Student

## 2024-11-12 LAB — CBC WITH DIFFERENTIAL/PLATELET
Basophils Absolute: 0 x10E3/uL (ref 0.0–0.2)
Basos: 1 %
EOS (ABSOLUTE): 0.1 x10E3/uL (ref 0.0–0.4)
Eos: 1 %
Hematocrit: 40.2 % (ref 34.0–46.6)
Hemoglobin: 13 g/dL (ref 11.1–15.9)
Immature Grans (Abs): 0 x10E3/uL (ref 0.0–0.1)
Immature Granulocytes: 0 %
Lymphocytes Absolute: 2 x10E3/uL (ref 0.7–3.1)
Lymphs: 30 %
MCH: 29.2 pg (ref 26.6–33.0)
MCHC: 32.3 g/dL (ref 31.5–35.7)
MCV: 90 fL (ref 79–97)
Monocytes Absolute: 0.6 x10E3/uL (ref 0.1–0.9)
Monocytes: 9 %
Neutrophils Absolute: 3.8 x10E3/uL (ref 1.4–7.0)
Neutrophils: 58 %
Platelets: 271 x10E3/uL (ref 150–450)
RBC: 4.45 x10E6/uL (ref 3.77–5.28)
RDW: 11.9 % (ref 11.7–15.4)
WBC: 6.6 x10E3/uL (ref 3.4–10.8)

## 2024-11-12 LAB — COMPREHENSIVE METABOLIC PANEL WITH GFR
ALT: 19 IU/L (ref 0–32)
AST: 18 IU/L (ref 0–40)
Albumin: 4.6 g/dL (ref 3.9–4.9)
Alkaline Phosphatase: 103 IU/L (ref 41–116)
BUN/Creatinine Ratio: 16 (ref 9–23)
BUN: 12 mg/dL (ref 6–24)
Bilirubin Total: 0.4 mg/dL (ref 0.0–1.2)
CO2: 23 mmol/L (ref 20–29)
Calcium: 9 mg/dL (ref 8.7–10.2)
Chloride: 102 mmol/L (ref 96–106)
Creatinine, Ser: 0.77 mg/dL (ref 0.57–1.00)
Globulin, Total: 2.3 g/dL (ref 1.5–4.5)
Glucose: 80 mg/dL (ref 70–99)
Potassium: 4.2 mmol/L (ref 3.5–5.2)
Sodium: 137 mmol/L (ref 134–144)
Total Protein: 6.9 g/dL (ref 6.0–8.5)
eGFR: 96 mL/min/1.73 (ref >59)

## 2024-11-12 LAB — IRON,TIBC AND FERRITIN PANEL
Ferritin: 100 ng/mL (ref 15–150)
Iron Saturation: 25 % (ref 15–55)
Iron: 73 ug/dL (ref 27–159)
Total Iron Binding Capacity: 287 ug/dL (ref 250–450)
UIBC: 214 ug/dL (ref 131–425)

## 2024-11-12 LAB — TSH: TSH: 1.8 u[IU]/mL (ref 0.450–4.500)

## 2024-11-18 NOTE — Progress Notes (Signed)
 Internal Medicine Clinic Attending  I was physically present during the key portions of the resident provided service and participated in the medical decision making of patient's management care. I reviewed pertinent patient test results.  The assessment, diagnosis, and plan were formulated together and I agree with the documentation in the resident's note.  Several days of non-specific symptoms including generalized weakness and myalgias. No other clear sick symptoms. Did have a very heavy menstrual cycle recently with clots. Using her albuterol  inhaler more over the weekend but improved today and no cough, wheeze or dyspnea reported. Encouraged to start daily maintenance inhaler as prescribed by Dr. Luke. She appears well in clinic, VSS, exam unremarkable. We discussed initial lab work today with close f/u for worsening or non-improving symptoms.   Karna Fellows, MD

## 2024-11-19 ENCOUNTER — Ambulatory Visit: Admitting: Podiatry

## 2024-11-19 DIAGNOSIS — M722 Plantar fascial fibromatosis: Secondary | ICD-10-CM | POA: Diagnosis not present

## 2024-11-21 NOTE — Progress Notes (Signed)
  Subjective:  Patient ID: Erin Collins, female    DOB: May 16, 1976,  MRN: [id number]  Chief Complaint  Patient presents with   Foot Pain    Left foot heel pain follow up pt stated that things are about the same no better or no worse     48 y.o. female presents with the above complaint. History confirmed with patient.  She returns for follow-up has not improved with the steroid taper  Objective:  Physical Exam: warm, good capillary refill, no trophic changes or ulcerative lesions, normal DP and PT pulses, normal sensory exam, and well-healed posterior incision, no pain in the posterior heel pain in the plantar heel today along the medial band of the plantar fascia insertion.   Radiographs: Multiple views x-ray of the left foot: Radiographs taken today shows no fracture or stress fracture there is some new calcification around the FHL transfer site Assessment:   1. Plantar fasciitis, left      Plan:  Patient was evaluated and treated and all questions answered.  Has not had much improvement.  I did recommend a corticosteroid injection which we performed today.  Follow-up in 6 weeks continue home physical therapy plan  After sterile prep with povidone-iodine solution and alcohol, the left heel was injected with 0.5cc 2% xylocaine  plain, 0.5cc 0.5% marcaine plain, 20 mg triamcinolone  acetonide, and 4 mg dexamethasone  was injected along the medial plantar fascia at the insertion on the plantar calcaneus. The patient tolerated the procedure well without complication.   No follow-ups on file.

## 2024-12-31 ENCOUNTER — Ambulatory Visit: Admitting: Podiatry

## 2025-01-08 ENCOUNTER — Other Ambulatory Visit: Payer: Self-pay | Admitting: Student

## 2025-01-08 NOTE — Telephone Encounter (Signed)
 Medication sent to pharmacy

## 2025-01-15 ENCOUNTER — Ambulatory Visit: Payer: Self-pay

## 2025-01-15 ENCOUNTER — Ambulatory Visit

## 2025-01-15 VITALS — BP 128/89 | HR 95 | Temp 98.5°F | Ht 64.0 in | Wt 195.6 lb

## 2025-01-15 DIAGNOSIS — R051 Acute cough: Secondary | ICD-10-CM | POA: Insufficient documentation

## 2025-01-15 DIAGNOSIS — J45901 Unspecified asthma with (acute) exacerbation: Secondary | ICD-10-CM

## 2025-01-15 LAB — RESP PANEL BY RT-PCR (RSV, FLU A&B, COVID)  RVPGX2
Influenza A by PCR: NEGATIVE
Influenza B by PCR: POSITIVE — AB
Resp Syncytial Virus by PCR: NEGATIVE
SARS Coronavirus 2 by RT PCR: NEGATIVE

## 2025-01-15 MED ORDER — BENZONATATE 100 MG PO CAPS: 100.0000 mg | ORAL_CAPSULE | Freq: Three times a day (TID) | ORAL | 0 refills | Status: AC | PRN

## 2025-01-15 MED ORDER — PREDNISONE 20 MG PO TABS: 40.0000 mg | ORAL_TABLET | Freq: Every day | ORAL | 0 refills | Status: AC

## 2025-01-15 NOTE — Assessment & Plan Note (Signed)
 Patient is not in any respiratory distress on physical exam. Her oxygen saturations are in the mid 90s and she has good air flow bilaterally with no wheezes or rales appreciated. With recent sick contact, likely due to viral illness. Will treat as asthma exacerbation due to increased inhaler use, cough and shortness of breath. Unfortunately out of the time frame where tamiflu would be effective. Precautions given to go to the ED if worsening SOB not controlled with inhalers or nebulizer treatments   Plan: Prednisone  for 5 days, can extend to 7 if symptoms are not improving  Tessalon  perles for cough Robitussin DM OTC Continue support care such as fluids, teas

## 2025-01-15 NOTE — Progress Notes (Signed)
 "  Acute Office Visit  Subjective:     Patient ID: Erin Collins, female    DOB: October 15, 1976, 49 y.o.   MRN: [id number]  Chief Complaint  Patient presents with   URI    Cough and sore throat Step-dtr influenza b+  Pt's symptoms started Saturday c/o  fatigue, achy, sore throat, cough, no fevers  Concerned about asthma      HPI Patient is in today for cough and sore throat.Patient states that symptoms started Saturday with post nasal drip and cough. Fatigue started on Sunday. Patient states that since then she has been coughing non stop and having occasional thick yellow sputum production. She states that the sore throat is not persistent but is exacerbated by a cough. No trouble swallowing at this time. Patient has not taken any medications but states that she has been using a tea to soothe her sore throat. She denies any wheezing with the cough but states that she hears a squeak at times that is not like stridor. She does endorse sick contact in her household that was positive for flu b. She states that her shortness of breath is worse at night. Denies any fevers, chills, body aches, n/v/d.  She is using her breo ellipta  daily and has airsupra  that she usually uses twice a day, but has now been using up to 4 times a day.     ROS  As noted in HPI     Objective:    BP 128/89 (BP Location: Right Arm, Patient Position: Sitting, Cuff Size: Normal)   Pulse 95   Temp 98.5 F (36.9 C) (Oral)   Ht 5' 4 (1.626 m)   Wt 195 lb 9.6 oz (88.7 kg)   SpO2 95%   BMI 33.57 kg/m    Physical Exam Constitutional:      General: She is not in acute distress.    Appearance: She is not toxic-appearing.  HENT:     Nose: No congestion.     Mouth/Throat:     Mouth: Mucous membranes are moist.     Pharynx: No oropharyngeal exudate.     Comments: Mild erythema noted at posterior oropharynx  Cardiovascular:     Rate and Rhythm: Normal rate and regular rhythm.  Pulmonary:     Effort: Pulmonary  effort is normal. No respiratory distress.     Breath sounds: No wheezing or rales.     Comments: Good air flow bilaterally  Neurological:     Mental Status: She is alert.     Results for orders placed or performed in visit on 01/15/25  Resp panel by RT-PCR (RSV, Flu A&B, Covid) Anterior Nasal Swab   Specimen: Anterior Nasal Swab  Result Value Ref Range   SARS Coronavirus 2 by RT PCR NEGATIVE NEGATIVE   Influenza A by PCR NEGATIVE NEGATIVE   Influenza B by PCR POSITIVE (A) NEGATIVE   Resp Syncytial Virus by PCR NEGATIVE NEGATIVE        Assessment & Plan:   Problem List Items Addressed This Visit   None Visit Diagnoses       Acute cough    -  Primary   Relevant Orders   Resp panel by RT-PCR (RSV, Flu A&B, Covid) Anterior Nasal Swab (Completed)       Meds ordered this encounter  Medications   predniSONE  (DELTASONE ) 20 MG tablet    Sig: Take 2 tablets (40 mg total) by mouth daily with breakfast for 5 days.    Dispense:  10 tablet    Refill:  0   benzonatate  (TESSALON ) 100 MG capsule    Sig: Take 1 capsule (100 mg total) by mouth 3 (three) times daily as needed for cough.    Dispense:  20 capsule    Refill:  0   Assessment & Plan Mild asthma exacerbation Patient is not in any respiratory distress on physical exam. Her oxygen saturations are in the mid 90s and she has good air flow bilaterally with no wheezes or rales appreciated. With recent sick contact, likely due to viral illness. Will treat as asthma exacerbation due to increased inhaler use, cough and shortness of breath. Unfortunately out of the time frame where tamiflu would be effective. Precautions given to go to the ED if worsening SOB not controlled with inhalers or nebulizer treatments   Plan: Prednisone  for 5 days, can extend to 7 if symptoms are not improving  Tessalon  perles for cough Robitussin DM OTC Continue support care such as fluids, teas     No follow-ups on file.  Dannel Rafter D'Mello,  DO Patient discussed with Dr. Trudy   "

## 2025-01-15 NOTE — Patient Instructions (Addendum)
 Today we discussed the following medical conditions and plan:   I am sorry that you are not feeling well. Take your inhalers as prescribed and do the nebulizer treatments if needed. Take 2 tablets of the prednisone  daily for the next 5 days. You can also use the robitussin DM to help with the cough as well.   If your breathing gets worse please go to the emergency room   We look forward to seeing you next time. Please call our clinic at [phone] if you have any questions or concerns. The best time to call is Monday-Friday from 9am-4pm, but there is someone available 24/7. If you need medication refills, please notify your pharmacy one week in advance and they will send us  a request.   Thank you for trusting me with your care. Wishing you the best!   Raunak Antuna D'Mello, DO  [facility]

## 2025-01-21 ENCOUNTER — Ambulatory Visit: Admitting: Podiatry

## 2025-01-21 DIAGNOSIS — M722 Plantar fascial fibromatosis: Secondary | ICD-10-CM | POA: Diagnosis not present

## 2025-01-21 NOTE — Patient Instructions (Signed)
 Continue doing your stretching exercises until your pain resolves.  If is not better by the next visit or gets worse let me know and we will do a steroid injection again.

## 2025-01-23 NOTE — Progress Notes (Signed)
 Internal Medicine Clinic Attending  Case discussed with the resident at the time of the visit.  We reviewed the resident's history and exam and pertinent patient test results.  I agree with the assessment, diagnosis, and plan of care documented in the resident's note.

## 2025-03-13 ENCOUNTER — Ambulatory Visit: Admitting: Allergy

## 2025-03-25 ENCOUNTER — Ambulatory Visit: Admitting: Podiatry
